# Patient Record
Sex: Male | Born: 1960
Health system: Southern US, Community
[De-identification: ages and names within clinical notes are randomized; demographics above are authoritative.]

## PROBLEM LIST (undated history)

## (undated) DIAGNOSIS — G8929 Other chronic pain: Secondary | ICD-10-CM

## (undated) DIAGNOSIS — R1110 Vomiting, unspecified: Secondary | ICD-10-CM

## (undated) DIAGNOSIS — I200 Unstable angina: Secondary | ICD-10-CM

## (undated) DIAGNOSIS — E669 Obesity, unspecified: Secondary | ICD-10-CM

## (undated) DIAGNOSIS — M545 Low back pain, unspecified: Secondary | ICD-10-CM

## (undated) DIAGNOSIS — I10 Essential (primary) hypertension: Secondary | ICD-10-CM

## (undated) DIAGNOSIS — E785 Hyperlipidemia, unspecified: Secondary | ICD-10-CM

## (undated) DIAGNOSIS — G4730 Sleep apnea, unspecified: Secondary | ICD-10-CM

## (undated) DIAGNOSIS — I119 Hypertensive heart disease without heart failure: Secondary | ICD-10-CM

## (undated) DIAGNOSIS — E119 Type 2 diabetes mellitus without complications: Secondary | ICD-10-CM

## (undated) DIAGNOSIS — E11319 Type 2 diabetes mellitus with unspecified diabetic retinopathy without macular edema: Secondary | ICD-10-CM

## (undated) DIAGNOSIS — I2510 Atherosclerotic heart disease of native coronary artery without angina pectoris: Secondary | ICD-10-CM

## (undated) DIAGNOSIS — M51369 Other intervertebral disc degeneration, lumbar region without mention of lumbar back pain or lower extremity pain: Secondary | ICD-10-CM

## (undated) DIAGNOSIS — M1990 Unspecified osteoarthritis, unspecified site: Secondary | ICD-10-CM

## (undated) DIAGNOSIS — J42 Unspecified chronic bronchitis: Secondary | ICD-10-CM

## (undated) DIAGNOSIS — IMO0002 Reserved for concepts with insufficient information to code with codable children: Secondary | ICD-10-CM

## (undated) DIAGNOSIS — M5136 Other intervertebral disc degeneration, lumbar region: Secondary | ICD-10-CM

## (undated) HISTORY — PX: FOOT FRACTURE SURGERY: SHX645

## (undated) HISTORY — PX: PILONIDAL CYST EXCISION: SHX744

## (undated) HISTORY — DX: Type 2 diabetes mellitus with unspecified diabetic retinopathy without macular edema: E11.319

## (undated) HISTORY — DX: Vomiting, unspecified: R11.10

## (undated) HISTORY — DX: Atherosclerotic heart disease of native coronary artery without angina pectoris: I25.10

## (undated) HISTORY — DX: Hyperlipidemia, unspecified: E78.5

## (undated) HISTORY — DX: Hypertensive heart disease without heart failure: I11.9

## (undated) HISTORY — DX: Type 2 diabetes mellitus without complications: E11.9

## (undated) HISTORY — DX: Essential (primary) hypertension: I10

## (undated) HISTORY — PX: SQUAMOUS CELL CARCINOMA EXCISION: SHX2433

## (undated) HISTORY — DX: Obesity, unspecified: E66.9

## (undated) HISTORY — DX: Unspecified osteoarthritis, unspecified site: M19.90

## (undated) HISTORY — PX: CORONARY ANGIOPLASTY: SHX604

---

## 2001-04-20 ENCOUNTER — Ambulatory Visit (HOSPITAL_BASED_OUTPATIENT_CLINIC_OR_DEPARTMENT_OTHER): Admission: RE | Admit: 2001-04-20 | Discharge: 2001-04-20 | Payer: Self-pay | Admitting: Family Medicine

## 2001-07-23 ENCOUNTER — Encounter: Admission: RE | Admit: 2001-07-23 | Discharge: 2001-10-21 | Payer: Self-pay | Admitting: *Deleted

## 2007-04-09 ENCOUNTER — Ambulatory Visit (HOSPITAL_COMMUNITY): Admission: RE | Admit: 2007-04-09 | Discharge: 2007-04-09 | Payer: Self-pay | Admitting: Surgery

## 2007-04-16 ENCOUNTER — Encounter: Admission: RE | Admit: 2007-04-16 | Discharge: 2007-04-16 | Payer: Self-pay | Admitting: Surgery

## 2007-04-16 ENCOUNTER — Ambulatory Visit (HOSPITAL_COMMUNITY): Admission: RE | Admit: 2007-04-16 | Discharge: 2007-04-16 | Payer: Self-pay | Admitting: Surgery

## 2007-07-28 HISTORY — PX: LAPAROSCOPIC GASTRIC BANDING: SHX1100

## 2007-08-06 ENCOUNTER — Encounter: Admission: RE | Admit: 2007-08-06 | Discharge: 2007-11-04 | Payer: Self-pay | Admitting: Surgery

## 2007-08-21 ENCOUNTER — Ambulatory Visit (HOSPITAL_COMMUNITY): Admission: RE | Admit: 2007-08-21 | Discharge: 2007-08-22 | Payer: Self-pay | Admitting: Surgery

## 2007-12-10 ENCOUNTER — Encounter: Admission: RE | Admit: 2007-12-10 | Discharge: 2007-12-10 | Payer: Self-pay | Admitting: Surgery

## 2008-03-03 ENCOUNTER — Encounter: Admission: RE | Admit: 2008-03-03 | Discharge: 2008-03-03 | Payer: Self-pay | Admitting: Surgery

## 2008-04-25 ENCOUNTER — Emergency Department (HOSPITAL_COMMUNITY): Admission: EM | Admit: 2008-04-25 | Discharge: 2008-04-25 | Payer: Self-pay | Admitting: Emergency Medicine

## 2008-06-23 ENCOUNTER — Encounter: Admission: RE | Admit: 2008-06-23 | Discharge: 2008-06-23 | Payer: Self-pay | Admitting: Surgery

## 2008-07-11 ENCOUNTER — Emergency Department (HOSPITAL_COMMUNITY): Admission: EM | Admit: 2008-07-11 | Discharge: 2008-07-11 | Payer: Self-pay | Admitting: Emergency Medicine

## 2009-01-04 ENCOUNTER — Emergency Department (HOSPITAL_COMMUNITY): Admission: EM | Admit: 2009-01-04 | Discharge: 2009-01-05 | Payer: Self-pay | Admitting: Emergency Medicine

## 2009-02-09 ENCOUNTER — Encounter: Admission: RE | Admit: 2009-02-09 | Discharge: 2009-02-09 | Payer: Self-pay | Admitting: Family Medicine

## 2010-10-17 ENCOUNTER — Encounter: Payer: Self-pay | Admitting: Family Medicine

## 2010-12-14 ENCOUNTER — Other Ambulatory Visit: Payer: Self-pay | Admitting: Family Medicine

## 2010-12-14 DIAGNOSIS — M545 Low back pain, unspecified: Secondary | ICD-10-CM

## 2010-12-15 ENCOUNTER — Ambulatory Visit
Admission: RE | Admit: 2010-12-15 | Discharge: 2010-12-15 | Disposition: A | Discharge status: Home / Self Care | Payer: Private Health Insurance - Indemnity | Source: Ambulatory Visit | Attending: Family Medicine | Admitting: Family Medicine

## 2010-12-15 DIAGNOSIS — M545 Low back pain, unspecified: Secondary | ICD-10-CM

## 2011-01-05 LAB — DIFFERENTIAL
Lymphocytes Relative: 16 % (ref 12–46)
Lymphs Abs: 1.5 10*3/uL (ref 0.7–4.0)
Monocytes Absolute: 0.7 10*3/uL (ref 0.1–1.0)
Monocytes Relative: 8 % (ref 3–12)
Neutro Abs: 6.8 10*3/uL (ref 1.7–7.7)

## 2011-01-05 LAB — COMPREHENSIVE METABOLIC PANEL
ALT: 27 U/L (ref 0–53)
AST: 23 U/L (ref 0–37)
Alkaline Phosphatase: 91 U/L (ref 39–117)
CO2: 23 mEq/L (ref 19–32)
Chloride: 103 mEq/L (ref 96–112)
GFR calc Af Amer: >60 mL/min (ref >60)
GFR calc non Af Amer: >60 mL/min (ref >60)
Potassium: 4.1 mEq/L (ref 3.5–5.1)
Total Protein: 6.4 g/dL (ref 6.0–8.3)

## 2011-01-05 LAB — POCT CARDIAC MARKERS
CKMB, poc: <1 ng/mL — ABNORMAL LOW (ref 1.0–8.0)
Myoglobin, poc: 41.9 ng/mL (ref 12–200)
Troponin i, poc: <0.05 ng/mL (ref 0.00–0.09)

## 2011-01-05 LAB — URINALYSIS, ROUTINE W REFLEX MICROSCOPIC
Bilirubin Urine: NEGATIVE
Leukocytes, UA: NEGATIVE
Nitrite: NEGATIVE
Protein, ur: NEGATIVE mg/dL
Urobilinogen, UA: 0.2 mg/dL (ref 0.0–1.0)
pH: 5.5 (ref 5.0–8.0)

## 2011-01-05 LAB — CBC
RBC: 4.54 MIL/uL (ref 4.22–5.81)
WBC: 9.1 10*3/uL (ref 4.0–10.5)

## 2011-02-08 NOTE — Op Note (Signed)
NAME:  [name], [name] NO.:  [id number]   MEDICAL RECORD NO.:  [id number]          PATIENT TYPE:  OIB   LOCATION:  0098                         FACILITY:  [facility]   PHYSICIAN:  Thornton Park. Daphine Deutscher, MD  DATE OF BIRTH:  1960/10/21   DATE OF PROCEDURE:  08/21/2007  DATE OF DISCHARGE:                               OPERATIVE REPORT   PREOPERATIVE DIAGNOSES:  1. Morbid obesity.  2. Diabetes.  3. Sleep apnea.  4. Hypertension.  5. Hyperlipidemia with BMI (body mass index) of 50.   POSTOPERATIVE DIAGNOSES:  1. Morbid obesity.  2. Diabetes.  3. Sleep apnea.  4. Hypertension.  5. Hyperlipidemia with BMI (body mass index) of 50.   PROCEDURE:  Lap band APL system installed.   SURGEON:  Luretha Murphy, MD   ASSISTANT:  Alfonse Ras, MD   ANESTHESIA:  General endotracheal.   DESCRIPTION OF PROCEDURE:  Tim Cook is a 50 year old gentleman  with the above-mentioned morbid obesity and comorbidities.  He is taken  back to room 1, clipped and prepped with Techni-Care and draped  sterilely.  Access to the abdomen was gained through the left upper  quadrant with the OptiVu technique without difficulty.  The abdomen was  insufflated.  The usual other trocars were placed including a 15 in the  right upper quadrant, 11 below that and the 5-mm in the upper midline  for the [facility] retractor.  The liver was retracted out of the way.  I  then went up and he had a fairly large fat pad at the GE junction.  I  took this down.  I inserted the APL tube and balloon and blew it up to  15 cm and brought it back and it met resistance at the GE junction and  there did not appear to be a hiatal hernia.  I also pulled up his upper  GI to look at that as well.  I then went down on the right side in the  crus at the stripe of fat across the right crus and made a small  incision there with the Bovie and then passed the band passer from the  below port on the right and it came  through easily.  The APL system was  introduced, threaded around and then snapped in place with the sizing  tube in place.  The sizing tube was then withdrawn.  The band was then  plicated with three sutures, getting good bites of stomach and the  pseudo pouch above.  These were secured  with a tie knot device.  The tubing was then brought out and connected  to a port and sutured to the fascia with 2-0 Prolenes.  The wound was  irrigated and all port sites were closed with 4-0 Vicryl with Benzoin  and Steri-Strips.  The patient tolerated the procedure well and was  taken to the recovery room in satisfactory condition.      Thornton Park Daphine Deutscher, MD  Electronically Signed     MBM/MEDQ  D:  08/21/2007  T:  08/21/2007  Job:  [id number]  cc:   Elana Alm. Nicholos Johns, M.D.  Fax: [phone]   Royetta Crochet, MD  Fax: [phone]

## 2011-02-11 NOTE — H&P (Signed)
NAME:  Tim Cook, Tim Cook NO.:  [id number]   MEDICAL RECORD NO.:  [id number]          PATIENT TYPE:  EMS   LOCATION:  ED                           FACILITY[facility]  [facility]   PHYSICIAN:  Adolph Pollack, M.D.DATE OF BIRTH:  [birth year]   DATE OF ADMISSION:  01/04/2009  DATE OF DISCHARGE:  01/05/2009                              HISTORY & PHYSICAL   CHIEF COMPLAINT:  Severe epigastric pain, dry heaves.   HISTORY:  This 50 year old male underwent a laparoscopic band  installation on November 2008.  This is complicated by the band being  too tight and in October 2009 he had the fluid removed, recovered from  that and in late February 2010 had a fill, and then began having some  problems with reflux, intermittent dysphagia, and regurgitation of food.  He then developed bronchitis and has been taking steroids, Z-Pak,  inhaler, and cough medicine.  He had the sudden onset, after eating  chili, of severe epigastric and substernal chest pain with dry heaves  and presented to the emergency department.  He underwent a cardiac  workup which was negative.  Chest x-ray suggested a dilated stomach and  abdominal series demonstrates massive gastric distention.  Subsequently,  a nasogastric tube was placed and a repeat x-ray demonstrated  significant decompression.  NG tube has since been removed.   PAST MEDICAL HISTORY:  1. Hypertension.  2. Diabetes mellitus.  3. Morbid obesity.  4. Hyperlipidemia.  5. Obstructive sleep apnea.   PREVIOUS OPERATIONS:  1. Laparoscopic band insulation.  2. Pilonidal cystectomy.   MEDICATIONS:  He has an inhaler, type unknown, and cough medicine.   SOCIAL HISTORY:  No tobacco use.  Occasional alcohol use.  He is  married, here with his wife.   REVIEW OF SYSTEMS:  Per HPI and specifically that he has lost 50 pounds  and he had to have the band fluid removed in October of 2009.   PHYSICAL EXAM:  GENERAL:  An overweight male in no acute distress.  VITAL SIGNS:  Temperature is 97.5, pulse 74, blood pressure 130/75.  RESPIRATORY:  Breath sounds equal and clear.  Respirations unlabored.  CARDIOVASCULAR:  Regular rate.  Regular rhythm.  No murmur.  No JVD.  ABDOMEN:  Soft, nontender.  Multiple scars and foreign bodies palpable  deep in the right abdomen in the subcutaneous tissues.  Active bowel  sounds noted.   LABORATORY DATA:  CBC within normal limits.  Electrolytes notable for  glucose of 273.  Cardiac enzymes normal.  Lipase 20.   Initial abdominal x-ray demonstrates massive gastric distention and a  vertically oriented laparoscopic band.  There is normal caliber small-  bowel and colon.  Post NG abdominal x-ray demonstrates gas decompression  with mild distention.   IMPRESSION:  Symptomatic gastric distention, thought could be gastric  outlet obstruction, but more likely the band is too tight and he is  unable to belch much.   PLAN:  Will put him on a liquid diet trial, repeat the x-rays in 3  hours, and I will discuss this with Dr. Daphine Deutscher.  He will likely  need to  have his band fluid withdrawn.      Adolph Pollack, M.D.  Electronically Signed     TJR/MEDQ  D:  01/05/2009  T:  01/05/2009  Job:  [id number]

## 2011-07-05 LAB — BASIC METABOLIC PANEL
Calcium: 9.3
Chloride: 100
GFR calc Af Amer: >60
GFR calc non Af Amer: >60
Potassium: 3.6

## 2011-07-05 LAB — CBC
Hemoglobin: 12.9 — ABNORMAL LOW
Platelets: 263
RDW: 12.3

## 2011-07-05 LAB — URINALYSIS, ROUTINE W REFLEX MICROSCOPIC
Protein, ur: NEGATIVE
Urobilinogen, UA: 0.2
pH: 6

## 2011-07-05 LAB — DIFFERENTIAL
Eosinophils Relative: 1
Lymphocytes Relative: 21
Lymphs Abs: 1.5
Monocytes Absolute: 0.6
Neutro Abs: 5.2

## 2011-07-05 LAB — HEMOGLOBIN AND HEMATOCRIT, BLOOD: Hemoglobin: 15.2

## 2011-11-22 ENCOUNTER — Other Ambulatory Visit: Payer: Self-pay | Admitting: Gastroenterology

## 2011-11-22 DIAGNOSIS — R112 Nausea with vomiting, unspecified: Secondary | ICD-10-CM

## 2011-11-25 ENCOUNTER — Other Ambulatory Visit: Payer: Private Health Insurance - Indemnity

## 2011-11-29 ENCOUNTER — Ambulatory Visit
Admission: RE | Admit: 2011-11-29 | Discharge: 2011-11-29 | Disposition: A | Discharge status: Home / Self Care | Payer: Self-pay | Source: Ambulatory Visit | Attending: Gastroenterology | Admitting: Gastroenterology

## 2011-11-29 ENCOUNTER — Other Ambulatory Visit: Payer: Self-pay | Admitting: Gastroenterology

## 2011-11-29 ENCOUNTER — Telehealth (INDEPENDENT_AMBULATORY_CARE_PROVIDER_SITE_OTHER): Payer: Self-pay

## 2011-11-29 DIAGNOSIS — R112 Nausea with vomiting, unspecified: Secondary | ICD-10-CM

## 2011-11-29 NOTE — Telephone Encounter (Signed)
Pt called to report he was seen by his gastroenterologist, Dr. Dulce Sellar, because of difficulty swallowing.  He had a CT scan which showed his lapband had slipped.  He will need to see Dr. Daphine Deutscher at the first available appointment.

## 2011-12-14 ENCOUNTER — Telehealth (INDEPENDENT_AMBULATORY_CARE_PROVIDER_SITE_OTHER): Payer: Self-pay | Admitting: General Surgery

## 2011-12-14 NOTE — Telephone Encounter (Signed)
Tim Cook with Dr Malena Edman office contacted [place] to request an appt for this patient to have fluid removed. She called on 11/29/11 and the message was not routed to my inbasket. Scheduled the patient with the lap band clinic.

## 2011-12-15 ENCOUNTER — Ambulatory Visit (INDEPENDENT_AMBULATORY_CARE_PROVIDER_SITE_OTHER): Payer: Managed Care, Other (non HMO) | Admitting: Physician Assistant

## 2011-12-15 ENCOUNTER — Encounter (INDEPENDENT_AMBULATORY_CARE_PROVIDER_SITE_OTHER): Payer: Self-pay

## 2011-12-15 VITALS — BP 130/86 | HR 70 | Temp 97.8°F | Resp 18 | Ht 69.0 in | Wt 261.0 lb

## 2011-12-15 DIAGNOSIS — Z4651 Encounter for fitting and adjustment of gastric lap band: Secondary | ICD-10-CM

## 2011-12-15 NOTE — Patient Instructions (Signed)
Return in 2 months or sooner if needed, especially if your symptoms of solid food intolerance persist despite today's measures.

## 2011-12-15 NOTE — Progress Notes (Signed)
  HISTORY: Tim Cook is a 51 y.o.male who received an AP-Large lap-band in November 2008 by Dr. Daphine Deutscher. He comes in with a several-month history of intermittent solid food dysphagia. He says he normally takes slim-fast in the morning and occasionally has issues with this as well. Then, about half the time, he's able to eat pretty much any solid food he desires. He has seen his primary physician who ordered an upper GI study and was referred here for further evaluation.  VITAL SIGNS: Filed Vitals:   12/15/11 0858  BP: 130/86  Pulse: 70  Temp: 97.8 F (36.6 C)  Resp: 18    PHYSICAL EXAM: Physical exam reveals a very well-appearing 51 y.o.male in no apparent distress Neurologic: Awake, alert, oriented Psych: Bright affect, conversant Respiratory: Breathing even and unlabored. No stridor or wheezing Abdomen: Soft, nontender, nondistended to palpation. Incisions well-healed. No incisional hernias. Port easily palpated. Extremities: Atraumatic, good range of motion.  ASSESMENT: 51 y.o.  male  s/p AP-Large lap-band.   PLAN: I have personally reviewed the upper GI study. The band is in excellent position with no evidence of slip. There is no anterior prolapse. There is clear obstruction at the band, with no contrast passing. I accessed the port to evaluate total fill volume, which was measured at 9 mL. The patient's port was accessed again with a 20G Huber needle without difficulty. Clear fluid was aspirated and 0.5 mL saline was removed from the port to give a total predicted volume of 8. mL. The patient was able to swallow water without difficulty following the procedure. I didn't believe he needed a full lap-band holiday as his symptoms were intermittent and he's actually gained weight since seeing Dr. Daphine Deutscher last in May. I advised him to watch his diet closely and to return to see [place] in 2 months. I asked him to return sooner if he has persistent symptoms despite today's measures. He voiced  understanding and agreement.

## 2012-02-09 ENCOUNTER — Encounter (INDEPENDENT_AMBULATORY_CARE_PROVIDER_SITE_OTHER): Payer: Managed Care, Other (non HMO)

## 2016-03-14 DIAGNOSIS — I10 Essential (primary) hypertension: Secondary | ICD-10-CM | POA: Diagnosis not present

## 2016-03-14 DIAGNOSIS — E782 Mixed hyperlipidemia: Secondary | ICD-10-CM | POA: Diagnosis not present

## 2016-03-14 DIAGNOSIS — E119 Type 2 diabetes mellitus without complications: Secondary | ICD-10-CM | POA: Diagnosis not present

## 2016-03-14 DIAGNOSIS — E1165 Type 2 diabetes mellitus with hyperglycemia: Secondary | ICD-10-CM | POA: Diagnosis not present

## 2016-03-23 DIAGNOSIS — E113299 Type 2 diabetes mellitus with mild nonproliferative diabetic retinopathy without macular edema, unspecified eye: Secondary | ICD-10-CM | POA: Diagnosis not present

## 2016-03-28 DIAGNOSIS — D225 Melanocytic nevi of trunk: Secondary | ICD-10-CM | POA: Diagnosis not present

## 2016-03-28 DIAGNOSIS — L570 Actinic keratosis: Secondary | ICD-10-CM | POA: Diagnosis not present

## 2016-03-28 DIAGNOSIS — L738 Other specified follicular disorders: Secondary | ICD-10-CM | POA: Diagnosis not present

## 2016-03-28 DIAGNOSIS — Z85828 Personal history of other malignant neoplasm of skin: Secondary | ICD-10-CM | POA: Diagnosis not present

## 2016-03-28 DIAGNOSIS — L821 Other seborrheic keratosis: Secondary | ICD-10-CM | POA: Diagnosis not present

## 2016-05-04 DIAGNOSIS — E119 Type 2 diabetes mellitus without complications: Secondary | ICD-10-CM | POA: Diagnosis not present

## 2016-05-04 DIAGNOSIS — M659 Synovitis and tenosynovitis, unspecified: Secondary | ICD-10-CM | POA: Diagnosis not present

## 2016-05-04 DIAGNOSIS — M7751 Other enthesopathy of right foot: Secondary | ICD-10-CM | POA: Diagnosis not present

## 2016-05-04 DIAGNOSIS — E785 Hyperlipidemia, unspecified: Secondary | ICD-10-CM | POA: Diagnosis not present

## 2016-05-04 DIAGNOSIS — R079 Chest pain, unspecified: Secondary | ICD-10-CM | POA: Diagnosis not present

## 2016-05-04 DIAGNOSIS — I10 Essential (primary) hypertension: Secondary | ICD-10-CM | POA: Diagnosis not present

## 2016-05-04 DIAGNOSIS — M25774 Osteophyte, right foot: Secondary | ICD-10-CM | POA: Diagnosis not present

## 2016-05-05 ENCOUNTER — Encounter: Payer: Self-pay | Admitting: Cardiology

## 2016-05-05 DIAGNOSIS — R079 Chest pain, unspecified: Secondary | ICD-10-CM | POA: Diagnosis not present

## 2016-05-05 DIAGNOSIS — Z8249 Family history of ischemic heart disease and other diseases of the circulatory system: Secondary | ICD-10-CM | POA: Diagnosis not present

## 2016-05-05 NOTE — H&P (Addendum)
Tim Cook, Tim Cook  Date of visit:  05/04/2016 DOB:  1961/08/09    Age:  55 yrs. Medical record number:  [id number]     Account number:  [id number] Primary Care Provider: Deatra JamesSUN, VYVYAN ____________________________ CURRENT DIAGNOSES  1. Chest pain, unspecified  2. Hyperlipidemia  3. Essential hypertension  4. Family history of ischemic heart disease and other diseases of the circulatory system  5. Obesity  6. Type 2 diabetes mellitus with proliferative diabetic retinopathy without macular edema  7. Bariatric Surgery Status ____________________________ ALLERGIES  No Known Allergies ____________________________ MEDICATIONS  1. aspirin 81 mg chewable tablet, 1 p.o. daily  2. naproxen sodium 220 mg tablet, BID PRN  3. atorvastatin 40 mg tablet, 1 p.o. daily  4. losartan 100 mg tablet, 1 p.o. daily  5. metformin 500 mg tablet, 2 tablets PO BID  6. Cialis 20 mg tablet, PRN ____________________________ HISTORY OF PRESENT ILLNESS This very nice 55 year old male seen for evaluation of chest and arm tightness. The patient has a prior history of severe morbid obesity and underwent lap band in 2008. He has lost 130 pounds since then. He has a significant family history of cardiac disease with sisters brothers and mother all having had artery disease and bypass grafting prematurely. He has non-insulin-dependent diabetes mellitus, hypertension, and hyperlipidemia. He was in his usual state of health until about 2 weeks ago when he was [place] the [place] and when walking up a hill had fairly severe midsternal pressure and severe right arm pain with exertion. The discomfort was also accompanied with dyspnea and would be relieved when he rested and recur when he started walking again. He came home and had a similar episode while mowing the grass last week but does not have recurrent symptoms otherwise. His EKG was normal. He normally denies PND, orthopnea or edema. He does have erectile dysfunction as  well as diabetic retinopathy in the past. He has a prior history of sleep apnea but or has not worn CPAP recently. ____________________________ PAST HISTORY  Past Medical Illnesses:  hypertension, DM-non-insulin dependent, hyperlipidemia, degenerative joint disease, obesity, sleep apnea;  Cardiovascular Illnesses:  no previous history of cardiac disease;  Infectious Diseases:  no previous history of significant infectious diseases;  Surgical Procedures:  gastric banding, right foot osteotomy, removal of pilonidal cyst;  Trauma History:  no previous history of significant trauma;  NYHA Classification:  I;  Canadian Angina Classification:  Class 2: Angina with moderate exertion;  Cardiology Procedures-Invasive:  no previous interventional or invasive cardiology procedures;  Cardiology Procedures-Noninvasive:  no previous non-invasive cardiovascular testing;  Peripheral Vascular Procedures:  no previous invasive peripheral vascular procedures.;  LVEF not documented,   ____________________________ CARDIO-PULMONARY TEST DATES EKG Date:  05/04/2016;   ____________________________ FAMILY HISTORY Brother -- Brother dead, Cancer, Myocardial infarction, Hypertension, Coronary artery bypass grafting Father -- Father dead, Myocardial infarction at less than 2860, Hypertension Mother -- Mother dead, Myocardial infarction at less than 1160, Hypertension Sister -- Sister alive with problem, Diabetes mellitus, High cholesterol, Coronary artery bypass grafting, Premature coronary heart disease ____________________________ SOCIAL HISTORY Alcohol Use:  rare and 1 to 2 drinks per month;  Smoking:  used to smoke but quit 1992;  Diet:  regular diet;  Lifestyle:  married;  Exercise:  no regular exercise;  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 Respiratory: mild dyspnea with exertion Cardiovascular:  please review HPI Abdominal: denies dyspepsia, GI bleeding, constipation, or diarrhea Genitourinary-Male: erectile dysfunction  Musculoskeletal:  arthritis of the knees, lumbar disc disease, chronic low back pain Neurological:  headaches Psychiatric:  denies depression or anxiety Hematological/Immunologic:  denies any food allergies, bleeding disorders. ____________________________ PHYSICAL EXAMINATION VITAL SIGNS  Blood Pressure:  118/72 Sitting, Left arm, regular cuff  , 122/80 Standing, Left arm and regular cuff   Pulse:  84/min. Weight:  253.00 lbs. Height:  69"BMI: 37  Constitutional:  pleasant white male in no acute distress, moderately obese Skin:  warm and dry to touch, no apparent skin lesions, or masses noted. Head:  normocephalic, normal hair pattern, no masses or tenderness Eyes:  EOMS Intact, PERRLA, C and S clear, Funduscopic exam not done. ENT:  ears, nose and throat reveal no gross abnormalities.  Dentition good. Neck:  supple, without massess. No JVD, thyromegaly or carotid bruits. Carotid upstroke normal. Chest:  normal symmetry, clear to auscultation. Cardiac:  regular rhythm, normal S1 and S2, No S3 or S4, no murmurs, gallops or rubs detected. Abdomen:  abdomen soft,non-tender, no masses, no hepatospenomegaly, or aneurysm noted Peripheral Pulses:  the femoral,dorsalis pedis, and posterior tibial pulses are full and equal bilaterally with no bruits auscultated. Extremities & Back:  no deformities, clubbing, cyanosis, erythema or edema observed. Normal muscle strength and tone. Neurological:  no gross motor or sensory deficits noted, affect appropriate, oriented x3. ____________________________ MOST RECENT LIPID PANEL 03/14/16  CHOL TOTL 130 mg/dl, LDL 62 NM, HDL 52 mg/dl, TRIGLYCER 81 mg/dl and CHOL/HDL 2.5  (Calc) ____________________________ IMPRESSIONS/PLAN  1. Chest discomfort suggestive of angina in a patient with multiple risk factors 2. Hypertension controlled 3. Diabetes mellitus with diabetic retinopathy 4. Obesity but with previous bariatric surgery 5. Hyperlipidemia under treatment 6. Multiple cardiovascular risk factors  Recommendations:  Has multiple cardiovascular risk factors and a suggestive history. Will start with a treadmill test tomorrow but he may need to have further imaging based on the symptoms and strong family history. He should take an aspirin a day. 12 lead EKG is normal.    05/05/16  TREADMILL  The patient exercised on the standard Bruce protocol a total of 6:13 minutes into Bruce stage 3 achieving a workload of 8 METS. The test was stopped due to dyspnea and fatigue with mild chest pain. Heart rate rose to 159 which was 96% of predicted maximum. Blood pressure response was normal.  12-lead EKG is normal at rest. With exercise the patient achieved target heart rate and had 1 mm ST segment depression consistent with ischemia. No arrhythmias occurred.  IMPRESSIONS:  1. Clinically and EKG positive for ischemia 2. Reduced exercise tolerance for age   RECOMMENDATIONS:  The patient has multiple cardiovascular risk factors including a strongly positive family history, diabetes hypertension and hyperlipidemia. I recommended that he undergo cardiac catheterization.Cardiac catheterization was discussed with the patient including risks of myocardial infarction, death, stroke, bleeding, arrhythmia, dye allergy, or renal insufficiency. He understands and is willing to proceed. Possibility of percutaneous intervention at the same setting was also discussed with the patient including risks. This will be done as an outpatient initially at [facility] by one of my colleagues with [facility].                         ____________________________ Cardiology Physician:  Georga Hacking, Montez Hageman.  MD [facility]

## 2016-05-09 NOTE — Progress Notes (Signed)
Tawana ScaleZach Smith D.O. [facility] [address] [place], [place] [address] Phone: [phone] Subjective:    I'm seeing this patient by the request  of:  Leanor RubensteinSUN,VYVYAN Y, MD   CC: Left knee pain  BJY:[id number]:Subjective  Tim Cook is a 55 y.o. male coming in with complaint of left knee pain. Patient states Undergoing a months. Starting have instability. Patient is full in a couple times in the knee just giving out. Patient does not remember any true injury. Sometimes has intermittent swelling. Denies any radiation down the leg. Patient states that it seems to stay localized. Concerned because it seems to be coming more frequent. Rates the severity of pain is 3 out of 10 but states that the instability is what is concerning.    Past Medical History:  Diagnosis Date  . Arthritis   . Cancer (HCC)    skin on face  . Diabetes mellitus   . Hyperlipidemia   . Hypertension   . Pilonidal cyst   . Vomiting    Past Surgical History:  Procedure Laterality Date  . LAPAROSCOPIC GASTRIC BANDING  07/2007   by Dr. Daphine DeutscherMartin   Social History   Social History  . Marital status: Married    Spouse name: N/A  . Number of children: N/A  . Years of education: N/A   Social History Main Topics  . Smoking status: Former Smoker    Quit date: 12/15/1991  . Smokeless tobacco: Never Used  . Alcohol use Yes     Comment: beer once per month  . Drug use: No  . Sexual activity: Not Asked   Other Topics Concern  . None   Social History Narrative  . None   No Known Allergies No family history on file. No known autoimmune disease  Past medical history, social, surgical and family history all reviewed in electronic medical record.  No pertanent information unless stated regarding to the chief complaint.   Review of Systems: No headache, visual changes, nausea, vomiting, diarrhea, constipation, dizziness, abdominal pain, skin rash, fevers, chills, night sweats, weight loss, swollen lymph nodes, body  aches, joint swelling, muscle aches, chest pain, shortness of breath, mood changes.   Objective  Blood pressure 130/82, pulse 85, height 5\' 9"  (1.753 m), weight 248 lb (112.5 kg), SpO2 97 %.  General: No apparent distress alert and oriented x3 mood and affect normal, dressed appropriately.  HEENT: Pupils equal, extraocular movements intact  Respiratory: Patient's speak in full sentences and does not appear short of breath  Cardiovascular: No lower extremity edema, non tender, no erythema  Skin: Warm dry intact with no signs of infection or rash on extremities or on axial skeleton.  Abdomen: Soft nontender  Neuro: Cranial nerves II through XII are intact, neurovascularly intact in all extremities with 2+ DTRs and 2+ pulses.  Lymph: No lymphadenopathy of posterior or anterior cervical chain or axillae bilaterally.  Gait normal with good balance and coordination.  MSK:  Non tender with full range of motion and good stability and symmetric strength and tone of shoulders, elbows, wrist, hip, and ankles bilaterally.  Knee: Left Normal to inspection with no erythema or effusion or obvious bony abnormalities. Palpation normal with no warmth, joint line tenderness, patellar tenderness, or condyle tenderness. ROM full in flexion and extension and lower leg rotation. Ligaments with solid consistent endpoints including ACL, PCL, LCL, MCL. Positive pain with McMurray's and Thessaly's Patellar glide with mild crepitus. Patellar and quadriceps tendons unremarkable. Hamstring and quadriceps strength  is normal.  Contralateral knee unremarkable  MSK US performed of: Left knee This study was ordered, performed, and interpreted by Terrilee FilesZach Smith D.O.  Knee: All structures visualized. Patient is a degenerative tear of the medial meniscus. No significant dysplasia. Patellar Tendon unremarkable on long and transverse views without effusion. No abnormality of prepatellar bursa. LCL and MCL unremarkable on long  and transverse views. Popliteal tendon does have hypoechoic changes and does have some mild subluxation.  IMPRESSION:  Degenerative meniscal tearing, popliteal tendinitis    Impression and Recommendations:     This case required medical decision making of moderate complexity.      Note: This dictation was prepared with Dragon dictation along with smaller phrase technology. Any transcriptional errors that result from this process are unintentional.

## 2016-05-10 ENCOUNTER — Encounter: Payer: Self-pay | Admitting: Family Medicine

## 2016-05-10 ENCOUNTER — Other Ambulatory Visit: Payer: Self-pay

## 2016-05-10 ENCOUNTER — Ambulatory Visit (INDEPENDENT_AMBULATORY_CARE_PROVIDER_SITE_OTHER)
Admission: RE | Admit: 2016-05-10 | Discharge: 2016-05-10 | Disposition: A | Discharge status: Home / Self Care | Payer: BLUE CROSS/BLUE SHIELD | Source: Ambulatory Visit | Attending: Family Medicine | Admitting: Family Medicine

## 2016-05-10 ENCOUNTER — Ambulatory Visit (INDEPENDENT_AMBULATORY_CARE_PROVIDER_SITE_OTHER): Payer: BLUE CROSS/BLUE SHIELD | Admitting: Family Medicine

## 2016-05-10 VITALS — BP 130/82 | HR 85 | Ht 69.0 in | Wt 248.0 lb

## 2016-05-10 DIAGNOSIS — M25562 Pain in left knee: Secondary | ICD-10-CM

## 2016-05-10 DIAGNOSIS — M25362 Other instability, left knee: Secondary | ICD-10-CM

## 2016-05-10 DIAGNOSIS — M25561 Pain in right knee: Secondary | ICD-10-CM | POA: Diagnosis not present

## 2016-05-10 DIAGNOSIS — Z23 Encounter for immunization: Secondary | ICD-10-CM | POA: Diagnosis not present

## 2016-05-10 DIAGNOSIS — M179 Osteoarthritis of knee, unspecified: Secondary | ICD-10-CM | POA: Diagnosis not present

## 2016-05-10 DIAGNOSIS — M7052 Other bursitis of knee, left knee: Secondary | ICD-10-CM | POA: Diagnosis not present

## 2016-05-10 MED ORDER — DICLOFENAC SODIUM 2 % TD SOLN: TRANSDERMAL | 3 refills | Status: DC

## 2016-05-10 MED ORDER — AMBULATORY NON FORMULARY MEDICATION: 0 refills | Status: DC

## 2016-05-10 NOTE — Patient Instructions (Signed)
Good to see you.  Xray downstairs today  Exercises 3 times a week.  Ice 20 minutes 2 times daily. Usually after activity and before bed. pennsaid pinkie amount topically 2 times daily as needed.  Avoid any twisting motions.  Compression sleeve either tommy copper or [email or web address] would be great.  You are a size large See me again in 3-4 weeks and if not better we may need a MRI.

## 2016-05-10 NOTE — Assessment & Plan Note (Signed)
Believe the patient does have a popliteal pain. Patient does have a chronic meniscal tear also noted. Patient is having instability and need a mixing concern. We'll try conservative therapy. X-rays pending. We discussed icing regimen and home exercises. Topical anti-inflammatory's prescribed. We discussed proper shoes. Follow-up again in 3-4 weeks. Patient continue to have internal derangement we may need to consider advanced imaging.

## 2016-05-11 DIAGNOSIS — M25774 Osteophyte, right foot: Secondary | ICD-10-CM | POA: Diagnosis not present

## 2016-05-11 DIAGNOSIS — M7751 Other enthesopathy of right foot: Secondary | ICD-10-CM | POA: Diagnosis not present

## 2016-05-18 ENCOUNTER — Encounter (HOSPITAL_COMMUNITY): Payer: Self-pay | Admitting: Cardiovascular Disease

## 2016-05-18 ENCOUNTER — Ambulatory Visit (HOSPITAL_COMMUNITY)
Admission: RE | Admit: 2016-05-18 | Discharge: 2016-05-19 | Disposition: A | Discharge status: Home / Self Care | Payer: BLUE CROSS/BLUE SHIELD | Source: Ambulatory Visit | Attending: Cardiovascular Disease | Admitting: Cardiovascular Disease

## 2016-05-18 ENCOUNTER — Encounter (HOSPITAL_COMMUNITY)
Admission: RE | Disposition: A | Discharge status: Home / Self Care | Payer: Self-pay | Source: Ambulatory Visit | Attending: Cardiovascular Disease

## 2016-05-18 DIAGNOSIS — I2584 Coronary atherosclerosis due to calcified coronary lesion: Secondary | ICD-10-CM | POA: Insufficient documentation

## 2016-05-18 DIAGNOSIS — E113599 Type 2 diabetes mellitus with proliferative diabetic retinopathy without macular edema, unspecified eye: Secondary | ICD-10-CM | POA: Insufficient documentation

## 2016-05-18 DIAGNOSIS — E782 Mixed hyperlipidemia: Secondary | ICD-10-CM

## 2016-05-18 DIAGNOSIS — E785 Hyperlipidemia, unspecified: Secondary | ICD-10-CM | POA: Diagnosis not present

## 2016-05-18 DIAGNOSIS — I119 Hypertensive heart disease without heart failure: Secondary | ICD-10-CM

## 2016-05-18 DIAGNOSIS — Z7982 Long term (current) use of aspirin: Secondary | ICD-10-CM | POA: Diagnosis not present

## 2016-05-18 DIAGNOSIS — Z9884 Bariatric surgery status: Secondary | ICD-10-CM | POA: Diagnosis not present

## 2016-05-18 DIAGNOSIS — Z87891 Personal history of nicotine dependence: Secondary | ICD-10-CM | POA: Insufficient documentation

## 2016-05-18 DIAGNOSIS — Z8249 Family history of ischemic heart disease and other diseases of the circulatory system: Secondary | ICD-10-CM | POA: Insufficient documentation

## 2016-05-18 DIAGNOSIS — I200 Unstable angina: Secondary | ICD-10-CM | POA: Diagnosis present

## 2016-05-18 DIAGNOSIS — E11319 Type 2 diabetes mellitus with unspecified diabetic retinopathy without macular edema: Secondary | ICD-10-CM

## 2016-05-18 DIAGNOSIS — Z79899 Other long term (current) drug therapy: Secondary | ICD-10-CM | POA: Insufficient documentation

## 2016-05-18 DIAGNOSIS — Z6836 Body mass index (BMI) 36.0-36.9, adult: Secondary | ICD-10-CM | POA: Diagnosis not present

## 2016-05-18 DIAGNOSIS — I25110 Atherosclerotic heart disease of native coronary artery with unstable angina pectoris: Secondary | ICD-10-CM | POA: Diagnosis not present

## 2016-05-18 DIAGNOSIS — Z7984 Long term (current) use of oral hypoglycemic drugs: Secondary | ICD-10-CM | POA: Diagnosis not present

## 2016-05-18 DIAGNOSIS — I2510 Atherosclerotic heart disease of native coronary artery without angina pectoris: Secondary | ICD-10-CM | POA: Diagnosis present

## 2016-05-18 DIAGNOSIS — E669 Obesity, unspecified: Secondary | ICD-10-CM

## 2016-05-18 HISTORY — DX: Low back pain, unspecified: M54.50

## 2016-05-18 HISTORY — DX: Sleep apnea, unspecified: G47.30

## 2016-05-18 HISTORY — DX: Atherosclerotic heart disease of native coronary artery without angina pectoris: I25.10

## 2016-05-18 HISTORY — DX: Other intervertebral disc degeneration, lumbar region: M51.36

## 2016-05-18 HISTORY — PX: CARDIAC CATHETERIZATION: SHX172

## 2016-05-18 HISTORY — DX: Type 2 diabetes mellitus without complications: E11.9

## 2016-05-18 HISTORY — DX: Bariatric surgery status: Z98.84

## 2016-05-18 HISTORY — DX: Reserved for concepts with insufficient information to code with codable children: IMO0002

## 2016-05-18 HISTORY — DX: Unstable angina: I20.0

## 2016-05-18 HISTORY — DX: Low back pain: M54.5

## 2016-05-18 HISTORY — DX: Other chronic pain: G89.29

## 2016-05-18 HISTORY — DX: Mixed hyperlipidemia: E78.2

## 2016-05-18 HISTORY — DX: Unspecified chronic bronchitis: J42

## 2016-05-18 HISTORY — DX: Other intervertebral disc degeneration, lumbar region without mention of lumbar back pain or lower extremity pain: M51.369

## 2016-05-18 LAB — GLUCOSE, CAPILLARY
GLUCOSE-CAPILLARY: 197 mg/dL — AB (ref 65–99)
GLUCOSE-CAPILLARY: 147 mg/dL — AB (ref 65–99)
GLUCOSE-CAPILLARY: 140 mg/dL — AB (ref 65–99)
Glucose-Capillary: 211 mg/dL — ABNORMAL HIGH (ref 65–99)

## 2016-05-18 SURGERY — LEFT HEART CATH AND CORONARY ANGIOGRAPHY
Anesthesia: LOCAL

## 2016-05-18 MED ORDER — MIDAZOLAM HCL 2 MG/2ML IJ SOLN
INTRAMUSCULAR | Status: DC | PRN
  Administered 2016-05-18 (×2): 1 mg via INTRAVENOUS
  Administered 2016-05-18: 2 mg via INTRAVENOUS

## 2016-05-18 MED ORDER — HEPARIN SODIUM (PORCINE) 1000 UNIT/ML IJ SOLN
INTRAMUSCULAR | Status: AC
  Filled 2016-05-18: qty 1

## 2016-05-18 MED ORDER — OXYCODONE-ACETAMINOPHEN 5-325 MG PO TABS
1.0000 | ORAL_TABLET | ORAL | Status: DC | PRN
  Administered 2016-05-18: 1 via ORAL
  Administered 2016-05-18: 21:00:00 2 via ORAL
  Filled 2016-05-18: qty 2
  Filled 2016-05-18: qty 1

## 2016-05-18 MED ORDER — VERAPAMIL HCL 2.5 MG/ML IV SOLN
INTRAVENOUS | Status: AC
  Filled 2016-05-18: qty 2

## 2016-05-18 MED ORDER — TICAGRELOR 90 MG PO TABS
90.0000 mg | ORAL_TABLET | Freq: Two times a day (BID) | ORAL | Status: DC
  Administered 2016-05-18 – 2016-05-19 (×2): 90 mg via ORAL
  Filled 2016-05-18 (×2): qty 1

## 2016-05-18 MED ORDER — SODIUM CHLORIDE 0.9 % WEIGHT BASED INFUSION: 1.0000 mL/kg/h | INTRAVENOUS | Status: DC

## 2016-05-18 MED ORDER — SODIUM CHLORIDE 0.9 % IV SOLN
INTRAVENOUS | Status: AC
  Administered 2016-05-18: 10:00:00 via INTRAVENOUS

## 2016-05-18 MED ORDER — SODIUM CHLORIDE 0.9% FLUSH: 3.0000 mL | Freq: Two times a day (BID) | INTRAVENOUS | Status: DC

## 2016-05-18 MED ORDER — HEPARIN (PORCINE) IN NACL 2-0.9 UNIT/ML-% IJ SOLN
INTRAMUSCULAR | Status: AC
  Filled 2016-05-18: qty 1000

## 2016-05-18 MED ORDER — ATORVASTATIN CALCIUM 40 MG PO TABS
40.0000 mg | ORAL_TABLET | Freq: Every day | ORAL | Status: DC
  Filled 2016-05-18: qty 1

## 2016-05-18 MED ORDER — ASPIRIN EC 81 MG PO TBEC
81.0000 mg | DELAYED_RELEASE_TABLET | Freq: Every day | ORAL | Status: DC
  Administered 2016-05-19: 81 mg via ORAL
  Filled 2016-05-18: qty 1

## 2016-05-18 MED ORDER — MORPHINE SULFATE (PF) 2 MG/ML IV SOLN: 2.0000 mg | INTRAVENOUS | Status: DC | PRN

## 2016-05-18 MED ORDER — ONDANSETRON HCL 4 MG/2ML IJ SOLN: 4.0000 mg | Freq: Four times a day (QID) | INTRAMUSCULAR | Status: DC | PRN

## 2016-05-18 MED ORDER — LIDOCAINE HCL (PF) 1 % IJ SOLN
INTRAMUSCULAR | Status: DC | PRN
  Administered 2016-05-18: 2 mL via SUBCUTANEOUS

## 2016-05-18 MED ORDER — TICAGRELOR 90 MG PO TABS
ORAL_TABLET | ORAL | Status: DC | PRN
  Administered 2016-05-18: 180 mg via ORAL

## 2016-05-18 MED ORDER — HEPARIN (PORCINE) IN NACL 2-0.9 UNIT/ML-% IJ SOLN
INTRAMUSCULAR | Status: DC | PRN
  Administered 2016-05-18: 10 mL via INTRA_ARTERIAL

## 2016-05-18 MED ORDER — SODIUM CHLORIDE 0.9 % WEIGHT BASED INFUSION
3.0000 mL/kg/h | INTRAVENOUS | Status: DC
  Administered 2016-05-18: 3 mL/kg/h via INTRAVENOUS

## 2016-05-18 MED ORDER — SODIUM CHLORIDE 0.9% FLUSH: 3.0000 mL | INTRAVENOUS | Status: DC | PRN

## 2016-05-18 MED ORDER — SODIUM CHLORIDE 0.9 % IV SOLN: 250.0000 mL | INTRAVENOUS | Status: DC | PRN

## 2016-05-18 MED ORDER — ACETAMINOPHEN 325 MG PO TABS: 650.0000 mg | ORAL_TABLET | ORAL | Status: DC | PRN

## 2016-05-18 MED ORDER — FENTANYL CITRATE (PF) 100 MCG/2ML IJ SOLN
INTRAMUSCULAR | Status: AC
  Filled 2016-05-18: qty 2

## 2016-05-18 MED ORDER — LIDOCAINE HCL (PF) 1 % IJ SOLN
INTRAMUSCULAR | Status: AC
  Filled 2016-05-18: qty 30

## 2016-05-18 MED ORDER — MIDAZOLAM HCL 2 MG/2ML IJ SOLN
INTRAMUSCULAR | Status: AC
  Filled 2016-05-18: qty 2

## 2016-05-18 MED ORDER — IOPAMIDOL (ISOVUE-370) INJECTION 76%
INTRAVENOUS | Status: AC
  Filled 2016-05-18: qty 50

## 2016-05-18 MED ORDER — FUROSEMIDE 10 MG/ML IJ SOLN
40.0000 mg | Freq: Two times a day (BID) | INTRAMUSCULAR | Status: DC
  Administered 2016-05-19: 09:00:00 40 mg via INTRAVENOUS
  Filled 2016-05-18 (×2): qty 4

## 2016-05-18 MED ORDER — FENTANYL CITRATE (PF) 100 MCG/2ML IJ SOLN
INTRAMUSCULAR | Status: DC | PRN
  Administered 2016-05-18: 50 ug via INTRAVENOUS
  Administered 2016-05-18 (×2): 25 ug via INTRAVENOUS

## 2016-05-18 MED ORDER — LOSARTAN POTASSIUM 50 MG PO TABS
100.0000 mg | ORAL_TABLET | Freq: Every day | ORAL | Status: DC
  Administered 2016-05-19: 09:00:00 100 mg via ORAL
  Filled 2016-05-18 (×2): qty 2

## 2016-05-18 MED ORDER — ASPIRIN 81 MG PO CHEW: 81.0000 mg | CHEWABLE_TABLET | ORAL | Status: DC

## 2016-05-18 MED ORDER — IOPAMIDOL (ISOVUE-370) INJECTION 76%
INTRAVENOUS | Status: DC | PRN
  Administered 2016-05-18: 245 mL via INTRA_ARTERIAL

## 2016-05-18 MED ORDER — IOPAMIDOL (ISOVUE-370) INJECTION 76%
INTRAVENOUS | Status: AC
  Filled 2016-05-18: qty 100

## 2016-05-18 MED ORDER — TICAGRELOR 90 MG PO TABS
ORAL_TABLET | ORAL | Status: AC
  Filled 2016-05-18: qty 2

## 2016-05-18 MED ORDER — HEPARIN (PORCINE) IN NACL 2-0.9 UNIT/ML-% IJ SOLN
INTRAMUSCULAR | Status: DC | PRN
Start: 2016-05-18 — End: 2016-05-18
  Administered 2016-05-18: 1000 mL

## 2016-05-18 MED ORDER — SODIUM CHLORIDE 0.9% FLUSH
3.0000 mL | Freq: Two times a day (BID) | INTRAVENOUS | Status: DC
Start: 2016-05-18 — End: 2016-05-19
  Administered 2016-05-18 (×2): 3 mL via INTRAVENOUS

## 2016-05-18 MED ORDER — HEPARIN SODIUM (PORCINE) 1000 UNIT/ML IJ SOLN
INTRAMUSCULAR | Status: DC | PRN
  Administered 2016-05-18 (×2): 6000 [IU] via INTRAVENOUS
  Administered 2016-05-18: 3000 [IU] via INTRAVENOUS

## 2016-05-18 MED ORDER — METOPROLOL TARTRATE 25 MG PO TABS
25.0000 mg | ORAL_TABLET | Freq: Every day | ORAL | Status: DC
  Administered 2016-05-19: 09:00:00 25 mg via ORAL
  Filled 2016-05-18 (×2): qty 1

## 2016-05-18 SURGICAL SUPPLY — 26 items
BALLN EMERGE MR 2.0X12 (BALLOONS) ×2
BALLN EMERGE MR 2.5X15 (BALLOONS) ×2
BALLN ~~LOC~~ EMERGE MR 2.75X15 (BALLOONS) ×2
BALLN ~~LOC~~ EMERGE MR 3.25X15 (BALLOONS) ×2
BALLN ~~LOC~~ EUPHORA RX 2.25X8 (BALLOONS) ×2
BALLOON EMERGE MR 2.0X12 (BALLOONS) IMPLANT
BALLOON EMERGE MR 2.5X15 (BALLOONS) IMPLANT
BALLOON ~~LOC~~ EMERGE MR 2.75X15 (BALLOONS) IMPLANT
BALLOON ~~LOC~~ EMERGE MR 3.25X15 (BALLOONS) IMPLANT
BALLOON ~~LOC~~ EUPHORA RX 2.25X8 (BALLOONS) IMPLANT
CATH INFINITI 5 FR JL3.5 (CATHETERS) ×1 IMPLANT
CATH INFINITI 5FR ANG PIGTAIL (CATHETERS) ×1 IMPLANT
CATH INFINITI JR4 5F (CATHETERS) ×1 IMPLANT
CATH VISTA GUIDE 6FR XBLAD3.5 (CATHETERS) ×1 IMPLANT
DEVICE RAD COMP TR BAND LRG (VASCULAR PRODUCTS) ×1 IMPLANT
GLIDESHEATH SLEND SS 6F .021 (SHEATH) ×1 IMPLANT
KIT ENCORE 26 ADVANTAGE (KITS) ×1 IMPLANT
KIT HEART LEFT (KITS) ×2 IMPLANT
PACK CARDIAC CATHETERIZATION (CUSTOM PROCEDURE TRAY) ×2 IMPLANT
STENT PROMUS PREM MR 2.25X16 (Permanent Stent) ×1 IMPLANT
STENT PROMUS PREM MR 2.5X20 (Permanent Stent) ×1 IMPLANT
STENT PROMUS PREM MR 3.0X20 (Permanent Stent) ×1 IMPLANT
TRANSDUCER W/STOPCOCK (MISCELLANEOUS) ×2 IMPLANT
TUBING CIL FLEX 10 FLL-RA (TUBING) ×2 IMPLANT
WIRE COUGAR XT STRL 190CM (WIRE) ×1 IMPLANT
WIRE SAFE-T 1.5MM-J .035X260CM (WIRE) ×1 IMPLANT

## 2016-05-18 NOTE — Interval H&P Note (Signed)
History and Physical Interval Note:  05/18/2016 8:13 AM  Tim Cook  has presented today for cardiac cath with the diagnosis of unstable angina. The various methods of treatment have been discussed with the patient and family. After consideration of risks, benefits and other options for treatment, the patient has consented to  Procedure(s): Left Heart Cath and Coronary Angiography (N/A) as a surgical intervention .  The patient's history has been reviewed, patient examined, no change in status, stable for surgery.  I have reviewed the patient's chart and labs.  Questions were answered to the patient's satisfaction.    Cath Lab Visit (complete for each Cath Lab visit)  Clinical Evaluation Leading to the Procedure:   ACS: No.  Non-ACS:    Anginal Classification: CCS III  Anti-ischemic medical therapy: Minimal Therapy (1 class of medications)  Non-Invasive Test Results: Intermediate-risk stress test findings: cardiac mortality 1-3%/year  Prior CABG: No previous CABG           Tim Cook

## 2016-05-18 NOTE — Progress Notes (Signed)
TR BAND REMOVAL  LOCATION:  right radial  DEFLATED PER PROTOCOL:  Yes.    TIME BAND OFF / DRESSING APPLIED:   1400   SITE UPON ARRIVAL:   Level 0  SITE AFTER BAND REMOVAL:  Level 0  CIRCULATION SENSATION AND MOVEMENT:  Within Normal Limits  Yes.    COMMENTS:    

## 2016-05-18 NOTE — Progress Notes (Signed)
S/W WALT @ [facility] # [phone]   BRILINTA 90 MG BID ( 30 )  COVER- YES  CO-PAY- $ 25.00  60 TAB  PRIOR APPROVAL - NO  PHARMACY : [facility]

## 2016-05-18 NOTE — Care Management Note (Addendum)
Case Management Note  Patient Details  Name: Tim Cook MRN: [id number] Date of Birth: [birth year]  Subjective/Objective:  Patient is s/p coronary stent intervention, per MD note , will be on brilinta.  NCM gave patient 30 day savings card, and told him co pay is 25.00.  NCM will cont to follow for dc needs.   He goes to [facility] on [address] and they do have Brilinta in stock.                 Action/Plan:   Expected Discharge Date:                  Expected Discharge Plan:  Home/Self Care  In-House Referral:     Discharge planning Services  CM Consult  Post Acute Care Choice:    Choice offered to:     DME Arranged:    DME Agency:     HH Arranged:    HH Agency:     Status of Service:  In process, will continue to follow  If discussed at Long Length of Stay Meetings, dates discussed:    Additional Comments:  Leone Havenaylor, Diontay Rosencrans Clinton, RN 05/18/2016, 3:47 PM

## 2016-05-18 NOTE — H&P (View-Only) (Signed)
Tim Cook, Tim Cook  Date of visit:  05/04/2016 DOB:  1961/09/17    Age:  55 yrs. Medical record number:  [id number]     Account number:  [id number] Primary Care Provider: Deatra JamesSUN, VYVYAN ____________________________ CURRENT DIAGNOSES  1. Chest pain, unspecified  2. Hyperlipidemia  3. Essential hypertension  4. Family history of ischemic heart disease and other diseases of the circulatory system  5. Obesity  6. Type 2 diabetes mellitus with proliferative diabetic retinopathy without macular edema  7. Bariatric Surgery Status ____________________________ ALLERGIES  No Known Allergies ____________________________ MEDICATIONS  1. aspirin 81 mg chewable tablet, 1 p.o. daily  2. naproxen sodium 220 mg tablet, BID PRN  3. atorvastatin 40 mg tablet, 1 p.o. daily  4. losartan 100 mg tablet, 1 p.o. daily  5. metformin 500 mg tablet, 2 tablets PO BID  6. Cialis 20 mg tablet, PRN ____________________________ HISTORY OF PRESENT ILLNESS This very nice [age] year old male seen for evaluation of chest and arm tightness. The patient has a prior history of severe morbid obesity and underwent lap band in 2008. He has lost 130 pounds since then. He has a significant family history of cardiac disease with sisters brothers and mother all having had artery disease and bypass grafting prematurely. He has non-insulin-dependent diabetes mellitus, hypertension, and hyperlipidemia. He was in his usual state of health until about 2 weeks ago when he was [place] the [place] and when walking up a hill had fairly severe midsternal pressure and severe right arm pain with exertion. The discomfort was also accompanied with dyspnea and would be relieved when he rested and recur when he started walking again. He came home and had a similar episode while mowing the grass last week but does not have recurrent symptoms otherwise. His EKG was normal. He normally denies PND, orthopnea or edema. He does have erectile dysfunction as  well as diabetic retinopathy in the past. He has a prior history of sleep apnea but or has not worn CPAP recently. ____________________________ PAST HISTORY  Past Medical Illnesses:  hypertension, DM-non-insulin dependent, hyperlipidemia, degenerative joint disease, obesity, sleep apnea;  Cardiovascular Illnesses:  no previous history of cardiac disease;  Infectious Diseases:  no previous history of significant infectious diseases;  Surgical Procedures:  gastric banding, right foot osteotomy, removal of pilonidal cyst;  Trauma History:  no previous history of significant trauma;  NYHA Classification:  I;  Canadian Angina Classification:  Class 2: Angina with moderate exertion;  Cardiology Procedures-Invasive:  no previous interventional or invasive cardiology procedures;  Cardiology Procedures-Noninvasive:  no previous non-invasive cardiovascular testing;  Peripheral Vascular Procedures:  no previous invasive peripheral vascular procedures.;  LVEF not documented,   ____________________________ CARDIO-PULMONARY TEST DATES EKG Date:  05/04/2016;   ____________________________ FAMILY HISTORY Brother -- Brother dead, Cancer, Myocardial infarction, Hypertension, Coronary artery bypass grafting Father -- Father dead, Myocardial infarction at less than 7460, Hypertension Mother -- Mother dead, Myocardial infarction at less than 2560, Hypertension Sister -- Sister alive with problem, Diabetes mellitus, High cholesterol, Coronary artery bypass grafting, Premature coronary heart disease ____________________________ SOCIAL HISTORY Alcohol Use:  rare and 1 to 2 drinks per month;  Smoking:  used to smoke but quit 1992;  Diet:  regular diet;  Lifestyle:  married;  Exercise:  no regular exercise;  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 Respiratory: mild dyspnea with exertion Cardiovascular:  please review HPI Abdominal: denies dyspepsia, GI bleeding, constipation, or diarrhea Genitourinary-Male: erectile dysfunction  Musculoskeletal:  arthritis of the knees, lumbar disc disease, chronic low back pain Neurological:  headaches Psychiatric:  denies depression or anxiety Hematological/Immunologic:  denies any food allergies, bleeding disorders. ____________________________ PHYSICAL EXAMINATION VITAL SIGNS  Blood Pressure:  118/72 Sitting, Left arm, regular cuff  , 122/80 Standing, Left arm and regular cuff   Pulse:  84/min. Weight:  253.00 lbs. Height:  69"BMI: 37  Constitutional:  pleasant white male in no acute distress, moderately obese Skin:  warm and dry to touch, no apparent skin lesions, or masses noted. Head:  normocephalic, normal hair pattern, no masses or tenderness Eyes:  EOMS Intact, PERRLA, C and S clear, Funduscopic exam not done. ENT:  ears, nose and throat reveal no gross abnormalities.  Dentition good. Neck:  supple, without massess. No JVD, thyromegaly or carotid bruits. Carotid upstroke normal. Chest:  normal symmetry, clear to auscultation. Cardiac:  regular rhythm, normal S1 and S2, No S3 or S4, no murmurs, gallops or rubs detected. Abdomen:  abdomen soft,non-tender, no masses, no hepatospenomegaly, or aneurysm noted Peripheral Pulses:  the femoral,dorsalis pedis, and posterior tibial pulses are full and equal bilaterally with no bruits auscultated. Extremities & Back:  no deformities, clubbing, cyanosis, erythema or edema observed. Normal muscle strength and tone. Neurological:  no gross motor or sensory deficits noted, affect appropriate, oriented x3. ____________________________ MOST RECENT LIPID PANEL 03/14/16  CHOL TOTL 130 mg/dl, LDL 62 NM, HDL 52 mg/dl, TRIGLYCER 81 mg/dl and CHOL/HDL 2.5  (Calc) ____________________________ IMPRESSIONS/PLAN  1. Chest discomfort suggestive of angina in a patient with multiple risk factors 2. Hypertension controlled 3. Diabetes mellitus with diabetic retinopathy 4. Obesity but with previous bariatric surgery 5. Hyperlipidemia under treatment 6. Multiple cardiovascular risk factors  Recommendations:  Has multiple cardiovascular risk factors and a suggestive history. Will start with a treadmill test tomorrow but he may need to have further imaging based on the symptoms and strong family history. He should take an aspirin a day. 12 lead EKG is normal.    05/05/16  TREADMILL  The patient exercised on the standard Bruce protocol a total of 6:13 minutes into Bruce stage 3 achieving a workload of 8 METS. The test was stopped due to dyspnea and fatigue with mild chest pain. Heart rate rose to 159 which was 96% of predicted maximum. Blood pressure response was normal.  12-lead EKG is normal at rest. With exercise the patient achieved target heart rate and had 1 mm ST segment depression consistent with ischemia. No arrhythmias occurred.  IMPRESSIONS:  1. Clinically and EKG positive for ischemia 2. Reduced exercise tolerance for age   RECOMMENDATIONS:  The patient has multiple cardiovascular risk factors including a strongly positive family history, diabetes hypertension and hyperlipidemia. I recommended that he undergo cardiac catheterization.Cardiac catheterization was discussed with the patient including risks of myocardial infarction, death, stroke, bleeding, arrhythmia, dye allergy, or renal insufficiency. He understands and is willing to proceed. Possibility of percutaneous intervention at the same setting was also discussed with the patient including risks. This will be [facility] by one of my colleagues [facility].                         ____________________________ Cardiology Physician:  Georga HackingW. Spencer Tilley, Montez HagemanJr.  MD [facility]

## 2016-05-19 ENCOUNTER — Telehealth: Payer: Self-pay

## 2016-05-19 ENCOUNTER — Encounter (HOSPITAL_COMMUNITY): Payer: Self-pay | Admitting: Physician Assistant

## 2016-05-19 DIAGNOSIS — Z7982 Long term (current) use of aspirin: Secondary | ICD-10-CM | POA: Diagnosis not present

## 2016-05-19 DIAGNOSIS — E113599 Type 2 diabetes mellitus with proliferative diabetic retinopathy without macular edema, unspecified eye: Secondary | ICD-10-CM | POA: Diagnosis not present

## 2016-05-19 DIAGNOSIS — Z79899 Other long term (current) drug therapy: Secondary | ICD-10-CM | POA: Diagnosis not present

## 2016-05-19 DIAGNOSIS — Z8249 Family history of ischemic heart disease and other diseases of the circulatory system: Secondary | ICD-10-CM | POA: Diagnosis not present

## 2016-05-19 DIAGNOSIS — E785 Hyperlipidemia, unspecified: Secondary | ICD-10-CM | POA: Diagnosis not present

## 2016-05-19 DIAGNOSIS — I200 Unstable angina: Secondary | ICD-10-CM | POA: Diagnosis not present

## 2016-05-19 DIAGNOSIS — Z6836 Body mass index (BMI) 36.0-36.9, adult: Secondary | ICD-10-CM | POA: Diagnosis not present

## 2016-05-19 DIAGNOSIS — Z7984 Long term (current) use of oral hypoglycemic drugs: Secondary | ICD-10-CM | POA: Diagnosis not present

## 2016-05-19 DIAGNOSIS — I119 Hypertensive heart disease without heart failure: Secondary | ICD-10-CM | POA: Diagnosis not present

## 2016-05-19 DIAGNOSIS — I2584 Coronary atherosclerosis due to calcified coronary lesion: Secondary | ICD-10-CM | POA: Diagnosis not present

## 2016-05-19 DIAGNOSIS — Z9884 Bariatric surgery status: Secondary | ICD-10-CM | POA: Diagnosis not present

## 2016-05-19 DIAGNOSIS — I25110 Atherosclerotic heart disease of native coronary artery with unstable angina pectoris: Secondary | ICD-10-CM | POA: Diagnosis not present

## 2016-05-19 DIAGNOSIS — Z87891 Personal history of nicotine dependence: Secondary | ICD-10-CM | POA: Diagnosis not present

## 2016-05-19 LAB — CBC
HEMATOCRIT: 41.3 % (ref 39.0–52.0)
HEMOGLOBIN: 13.8 g/dL (ref 13.0–17.0)
MCH: 30.5 pg (ref 26.0–34.0)
MCHC: 33.4 g/dL (ref 30.0–36.0)
MCV: 91.2 fL (ref 78.0–100.0)
Platelets: 259 10*3/uL (ref 150–400)
RBC: 4.53 MIL/uL (ref 4.22–5.81)
RDW: 12 % (ref 11.5–15.5)
WBC: 8.6 10*3/uL (ref 4.0–10.5)

## 2016-05-19 LAB — POCT ACTIVATED CLOTTING TIME
ACTIVATED CLOTTING TIME: 296 s
Activated Clotting Time: 252 seconds

## 2016-05-19 LAB — BASIC METABOLIC PANEL
ANION GAP: 4 — AB (ref 5–15)
BUN: 8 mg/dL (ref 6–20)
CO2: 31 mmol/L (ref 22–32)
Calcium: 9 mg/dL (ref 8.9–10.3)
Chloride: 101 mmol/L (ref 101–111)
Creatinine, Ser: 0.84 mg/dL (ref 0.61–1.24)
GLUCOSE: 142 mg/dL — AB (ref 65–99)
POTASSIUM: 4.1 mmol/L (ref 3.5–5.1)
SODIUM: 136 mmol/L (ref 135–145)

## 2016-05-19 LAB — GLUCOSE, CAPILLARY: Glucose-Capillary: 141 mg/dL — ABNORMAL HIGH (ref 65–99)

## 2016-05-19 MED ORDER — NITROGLYCERIN 0.4 MG SL SUBL: 0.4000 mg | SUBLINGUAL_TABLET | SUBLINGUAL | 3 refills | Status: DC | PRN

## 2016-05-19 MED ORDER — TICAGRELOR 90 MG PO TABS: 90.0000 mg | ORAL_TABLET | Freq: Two times a day (BID) | ORAL | 0 refills | Status: DC

## 2016-05-19 MED ORDER — TICAGRELOR 90 MG PO TABS: 90.0000 mg | ORAL_TABLET | Freq: Two times a day (BID) | ORAL | 11 refills | Status: DC

## 2016-05-19 NOTE — Telephone Encounter (Signed)
New message       TCM appt on 06-01-16 per Bjorn Loserhonda

## 2016-05-19 NOTE — Progress Notes (Addendum)
Appreciate Dr. Weldon PickingMcAlhaney's help.  Cath films reviewed yesterday afternoon.  He does have some distal disease in his LAD as well as the distal coronary circulation on the left.  Agree her medical treatment of the severe right coronary artery disease.  I'll see him back in follow-up in one week.  Discussed importance of lifestyle changes.  Would like him to get involved in cardiac rehabilitation.  Darden PalmerW. Spencer Tilley, Jr. MD [facility] 9:00 AM

## 2016-05-19 NOTE — Discharge Instructions (Signed)

## 2016-05-19 NOTE — Progress Notes (Signed)
CARDIAC REHAB PHASE I   PRE:  Rate/Rhythm: 73 SR  BP:  Sitting: 163/84        SaO2: 100 RA  MODE:  Ambulation: 500 ft   POST:  Rate/Rhythm: 83 SR  BP:  Sitting: 158/92         SaO2: 100 RA  Pt ambulated 500 ft on RA, independent, steady gait, tolerated well.  Pt c/o very mild DOE, denies cp, dizziness, declined rest stop. Completed PCI/stent education with pt and wife at bedside.  Reviewed risk factors, anti-platelet therapy, stent card, activity restrictions, ntg, exercise, heart healthy diet, carb counting, portion control, sodium recommendations and phase 2 cardiac rehab. Pt verbalized understanding. Pt states he has viewed the PCI video as well.  Pt agrees to phase 2 cardiac rehab referral, [facility]. Pt to recliner after walk, call bell within reach.   [phone] Joylene GrapesEmily C Kyliah Deanda, RN, BSN 05/19/2016 8:56 AM

## 2016-05-19 NOTE — Discharge Summary (Signed)
Discharge Summary    Patient ID: Tim Cook,  MRN: [id number], DOB/AGE: 04-30-1961 55 y.o.  Admit date: 05/18/2016 Discharge date: 05/19/2016  Primary Care Provider: Leanor RubensteinSUN,VYVYAN Y Primary Cardiologist: Dr Donnie Ahoilley  Discharge Diagnoses    Principal Problem:   Unstable angina [facility](HCC) Active Problems:   Bariatric surgery status   CAD (coronary artery disease), native coronary artery   Hypertensive heart disease without CHF   Controlled type 2 diabetes with retinopathy (HCC)   Hyperlipidemia   Obesity (BMI 30-39.9)   Allergies No Known Allergies  Diagnostic Studies/Procedures    Cardiac cath: 08/23  Prox RCA-2 lesion, 80 %stenosed.  Prox RCA-1 lesion, 80 %stenosed.  Mid RCA lesion, 99 %stenosed.  Ost 1st Mrg to 1st Mrg lesion, 30 %stenosed.  1st Mrg lesion, 50 %stenosed.  3rd Mrg lesion, 30 %stenosed.  Ost LPDA lesion, 70 %stenosed.  Ost 4th Mrg to 4th Mrg lesion, 70 %stenosed.  Dist Cx lesion, 60 %stenosed.  A STENT PROMUS PREM MR 2.25X16 drug eluting stent was successfully placed.  Dist LAD lesion, 90 %stenosed.  Post intervention, there is a 0% residual stenosis.  A STENT PROMUS PREM MR 2.5X20 drug eluting stent was successfully placed.  Mid LAD lesion, 99 %stenosed.  Post intervention, there is a 0% residual stenosis.  A STENT PROMUS PREM MR 3.0X20 drug eluting stent was successfully placed.  Prox LAD lesion, 90 %stenosed.  Post intervention, there is a 10% residual stenosis.  The left ventricular systolic function is normal.  LV end diastolic pressure is moderately elevated.  The left ventricular ejection fraction is 50-55% by visual estimate.  1. Triple vessel CAD 2. Severe tandem stenoses in the proximal, mid and distal LAD. The proximal lesion was calcified.  3. Moderate stenosis in the distal left Circumflex system leading into relatively small left sided posterolateral branches. 4. Severe stenoses in the small non-dominant RCA 5.  Overall preserved LV systolic function with elevated filling pressure. 6. Successful PTCA/DES x 3 in the proximal, mid and distal LAD (non-overlapping stents).   Recommendations: Will treat with ASA/Brilinta for one year. Will continue beta blocker and statin. Will give one dose of IV Lasix later today given elevated filling pressures. Medical management of disease in the distal Circumflex system and small non-dominant RCA.   Lesion #1: (distal LAD): 2.0 x 12 mm balloon x 1. 2.25 x 16 mm Promus Premier DES x 1. Stent post-dilated with a 2.25 x 8 mm [place] balloon x 1.   Lesion #2: ( mid LAD): 2.0 x 12 mm balloon x 1. 2.5 x 20 mm Promus Premier DES x 1. Stent post-dilated with a 2.75 x 15 mm [employer] balloon.   Lesion #3: (proximal LAD): This lesion was calcified but the calcium did not appear to be encasing the entire vessel, appearing more eccentric. A 2.5 x 15 mm balloon was inflated in the lesion. There was a waste in the balloon. A 2.75 x 15 mm [place] balloon was expanded well in the lesion. A 3.0 x 20 mm Promus Premier DES was deployed in the proximal vessel. The stent was post-dilated with a 3.25 x 15 mm  balloon at high pressures with good stent expansion.  _____________   History of Present Illness     55 yo male with a history of HTN, HLD, DM, obesity s/p lap band and FH of premature CAD was seen by Dr Donnie Ahoilley 08/10 for chest pain and scheduled for outpatient cath. He came to the hospital for the  procedure on 08/23.  Hospital Course     Consultants: None  Cardiac cath results are above. Medical management of the CFX & RCA disease was recommended and 3 DES were placed in the LAD. He tolerated the procedure well.  On 08/24, he was seen by Dr Elease Hashimoto and all data were reviewed. His BP was elevated, but he had missed his Cozaar because of the procedure, it was restarted. He had some sinus bradycardia overnight, with heart rates in the 50s, so his beta blocker will be daytime only. Further BP management  will be left to Dr Donnie Aho and Dr Wynelle Link.   He had an elevated LVEDP and was IV Lasix, which was given on 08/24. His respiratory status was at baseline. He was having no chest pain or SOB with ambulation.   He was seen by cardiac rehab and educated on heart-healthy lifestyle modifications, stent restrictions and exercise guidelines. His physical exam was stable except for a small amount of bruising and swelling at the cath site. No further workup was indicated and he is considered stable for discharge, to follow up as an outpatient. _____________  Discharge Vitals Blood pressure (!) 183/89, pulse 77, temperature 97.8 F (36.6 C), temperature source Oral, resp. rate 15, height 5\' 9"  (1.753 m), weight 246 lb 4.1 oz (111.7 kg), SpO2 100 %.  Filed Weights   05/18/16 0650 05/19/16 0405  Weight: 245 lb (111.1 kg) 246 lb 4.1 oz (111.7 kg)  General: Well developed, well nourished, male in no acute distress Head: Eyes PERRLA, No xanthomas.   Normocephalic and atraumatic  Lungs: Clear bilaterally to auscultation. Heart: HRRR S1 S2, without MRG.  Pulses are 2+ & equal. No carotid bruit. No JVD. Abdomen: Bowel sounds are present, abdomen soft and non-tender without masses or  hernias noted. Msk: Normal strength and tone for age. Extremities: No clubbing, cyanosis or edema.  Small amount ecchymosis and edema at cath site Skin:  No rashes or lesions noted. Neuro: Alert and oriented X 3. Psych:  Good affect, responds appropriately   Labs & Radiologic Studies    CBC  Recent Labs  05/19/16 0357  WBC 8.6  HGB 13.8  HCT 41.3  MCV 91.2  PLT 259   Basic Metabolic Panel  Recent Labs  05/19/16 0357  NA 136  K 4.1  CL 101  CO2 31  GLUCOSE 142*  BUN 8  CREATININE 0.84  CALCIUM 9.0   _____________   Disposition   Pt is being discharged home today in good condition.  Follow-up Plans & Appointments    Follow-up Information    W Viann Fish, MD. Schedule an appointment as soon as  possible for a visit in 1 week(s).   Specialty:  Cardiology Contact information: [address] [place] [address] [phone]          Discharge Instructions    Amb Referral to Cardiac Rehabilitation    Complete by:  As directed   Diagnosis:  Coronary Stents      Discharge Medications   Current Discharge Medication List    START taking these medications   Details  nitroGLYCERIN (NITROSTAT) 0.4 MG SL tablet Place 1 tablet (0.4 mg total) under the tongue every 5 (five) minutes as needed for chest pain. Qty: 25 tablet, Refills: 3    ticagrelor (BRILINTA) 90 MG TABS tablet Take 1 tablet (90 mg total) by mouth 2 (two) times daily. Qty: 60 tablet, Refills: 11      CONTINUE these medications  which have NOT CHANGED   Details  aspirin EC 81 MG tablet Take 81 mg by mouth daily.    atorvastatin (LIPITOR) 40 MG tablet     losartan (COZAAR) 100 MG tablet     metFORMIN (GLUCOPHAGE) 500 MG tablet Take 1,000 mg by mouth 2 (two) times daily with a meal.     metoprolol tartrate (LOPRESSOR) 25 MG tablet Take 25 mg by mouth daily.     Diclofenac Sodium (PENNSAID) 2 % SOLN Apply 1 pump twice daily. Qty: 112 g, Refills: 3          Outstanding Labs/Studies   None  Duration of Discharge Encounter   Greater than 30 minutes including physician time.  Melida Quitter NP 05/19/2016, 9:17 AM   Attending Note:   The patient was seen and examined.  Agree with assessment and plan as noted above.  Changes made to the above note as needed.  Patient seen and independently examined with Theodore Demark, PA .   We discussed all aspects of the encounter. I agree with the assessment and plan as stated above.  Pt has done well following PCI. Had elevated LVEDP at cath so he received 2 doses of IV lasix. Is not going home on lasix. Advised him to avoid salt. Exercise  Follow up with Dr. Donnie Aho     I have spent a total of 40 minutes with patient reviewing  hospital  notes , telemetry, EKGs, labs and examining patient as well as establishing an assessment and plan that was discussed with the patient. > 50% of time was spent in direct patient care.    Vesta Mixer, Montez Hageman., MD, [facility] 05/19/2016, 10:11 AM [address],  [address] Office [phone] Pager [phone]

## 2016-05-20 NOTE — Telephone Encounter (Signed)
This is a pt of Dr. Justice BritainSpence Tilley and will followed by him.

## 2016-05-27 DIAGNOSIS — E785 Hyperlipidemia, unspecified: Secondary | ICD-10-CM | POA: Diagnosis not present

## 2016-05-27 DIAGNOSIS — I2510 Atherosclerotic heart disease of native coronary artery without angina pectoris: Secondary | ICD-10-CM | POA: Diagnosis not present

## 2016-06-01 ENCOUNTER — Ambulatory Visit: Payer: BLUE CROSS/BLUE SHIELD | Admitting: Physician Assistant

## 2016-06-08 NOTE — Progress Notes (Signed)
[name] [address] [place], [place] [address] Phone: [phone] Subjective:    I'm seeing this patient by the request  of:  Leanor Rubenstein, MD    CC: Left knee pain f/u  [id number]  Tim Cook is a 55 y.o. male coming in with complaint of left knee pain. Month ago and did have more of a popliteal tendinitis as well as a small meniscal tear. Have instability. Patient has had it where his knee has buckled a couple times since last visit. Patient has almost fallen twice.     Past Medical History:  Diagnosis Date  . Arthritis    "knees, lower back, hands" (05/18/2016)  . CAD (coronary artery disease), native coronary artery    05/18/16  Cath severe 90% LAD prox, 99% mid, Circ dominant with 30% OM1, 50% OM2, 30% OM3, 60% distal circ and PL, severe stenosis nondominant RCA.  EF 55%  3.0x 20, 2.5 x 20 mm, 2.25 x 16 mm Promus premier sent to LAD Dr. Clifton James   . Chronic bronchitis (HCC)    "most years" (05/18/2016)  . Chronic lower back pain   . DDD (degenerative disc disease), lumbar   . Hyperlipidemia   . Hypertension   . Sleep apnea    "dx'd; never wore mask" (05/18/2016)  . Squamous carcinoma (HCC)    cut off right upper arm, left shoulder; froze off nose and right side of face  . Type II diabetes mellitus (HCC)   . Unstable angina (HCC)   . Vomiting    Past Surgical History:  Procedure Laterality Date  . CARDIAC CATHETERIZATION N/A 05/18/2016   Procedure: Left Heart Cath and Coronary Angiography;  Surgeon: Kathleene Hazel, MD;  Location[facility] INVASIVE CV LAB;  Service: Cardiovascular;  Laterality: N/A;  . CARDIAC CATHETERIZATION N/A 05/18/2016   Procedure: Coronary Stent Intervention;  Surgeon: Kathleene Hazel, MD;  Location: [facility] INVASIVE CV LAB;  Service: Cardiovascular;  Laterality: N/A;  . CORONARY ANGIOPLASTY    . FOOT FRACTURE SURGERY Right 1970s  . LAPAROSCOPIC GASTRIC BANDING  07/2007   by Dr. Daphine Deutscher  . PILONIDAL CYST  EXCISION  ~ 1985   "took 8 out all at once"  . SQUAMOUS CELL CARCINOMA EXCISION     right upper arm, left shoulder   Social History   Social History  . Marital status: Married    Spouse name: N/A  . Number of children: N/A  . Years of education: N/A   Social History Main Topics  . Smoking status: Former Smoker    Packs/day: 0.50    Years: 12.00    Types: Cigarettes    Quit date: 12/15/1991  . Smokeless tobacco: Former Neurosurgeon    Types: Snuff     Comment: "quit snuff in ~ 1981"  . Alcohol use 4.2 oz/week    7 Cans of beer per week  . Drug use: No  . Sexual activity: Yes   Other Topics Concern  . None   Social History Narrative  . None   No Known Allergies No family history on file. No known autoimmune disease  Past medical history, social, surgical and family history all reviewed in electronic medical record.  No pertanent information unless stated regarding to the chief complaint.   Review of Systems: No headache, visual changes, nausea, vomiting, diarrhea, constipation, dizziness, abdominal pain, skin rash, fevers, chills, night sweats, weight loss, swollen lymph nodes, body aches, joint swelling, muscle aches, chest pain, shortness of  breath, mood changes.   Objective  Blood pressure 122/80, pulse 72, weight 248 lb (112.5 kg), SpO2 98 %.  General: No apparent distress alert and oriented x3 mood and affect normal, dressed appropriately.  HEENT: Pupils equal, extraocular movements intact  Respiratory: Patient's speak in full sentences and does not appear short of breath  Cardiovascular: No lower extremity edema, non tender, no erythema  Skin: Warm dry intact with no signs of infection or rash on extremities or on axial skeleton.  Abdomen: Soft nontender  Neuro: Cranial nerves II through XII are intact, neurovascularly intact in all extremities with 2+ DTRs and 2+ pulses.  Lymph: No lymphadenopathy of posterior or anterior cervical chain or axillae bilaterally.  Gait  normal with good balance and coordination.  MSK:  Non tender with full range of motion and good stability and symmetric strength and tone of shoulders, elbows, wrist, hip, and ankles bilaterally.  Knee: Left Normal to inspection with no erythema or effusion or obvious bony abnormalities. Palpation normal with no warmth, joint line tenderness, patellar tenderness, or condyle tenderness. ROM full in flexion and extension and lower leg rotation. Ligaments with solid consistent endpoints including ACL, PCL, LCL, MCL. Positive pain with McMurray's and Thessaly's Patellar glide with mild crepitus. Patellar and quadriceps tendons unremarkable. Hamstring and quadriceps strength is normal.  Contralateral knee unremarkable No significant change.      Impression and Recommendations:     This case required medical decision making of moderate complexity.      Note: This dictation was prepared with Dragon dictation along with smaller phrase technology. Any transcriptional errors that result from this process are unintentional.

## 2016-06-09 ENCOUNTER — Ambulatory Visit (INDEPENDENT_AMBULATORY_CARE_PROVIDER_SITE_OTHER): Payer: BLUE CROSS/BLUE SHIELD | Admitting: Family Medicine

## 2016-06-09 ENCOUNTER — Encounter: Payer: Self-pay | Admitting: Family Medicine

## 2016-06-09 VITALS — BP 122/80 | HR 72 | Wt 248.0 lb

## 2016-06-09 DIAGNOSIS — S83282A Other tear of lateral meniscus, current injury, left knee, initial encounter: Secondary | ICD-10-CM

## 2016-06-09 DIAGNOSIS — S83289A Other tear of lateral meniscus, current injury, unspecified knee, initial encounter: Secondary | ICD-10-CM | POA: Insufficient documentation

## 2016-06-09 DIAGNOSIS — S83282D Other tear of lateral meniscus, current injury, left knee, subsequent encounter: Secondary | ICD-10-CM

## 2016-06-09 HISTORY — DX: Other tear of lateral meniscus, current injury, unspecified knee, initial encounter: S83.289A

## 2016-06-09 NOTE — Assessment & Plan Note (Signed)
Patient does have a meniscal tear on the lateral aspect as well as the popliteal tendinitis. Has not responded well to conservative therapy at this time and continues to have instability. Patient declined advance imaging at this point but will be sent to formal physical therapy. Brace given today for stability. Patient knows if worsening symptoms that advance imaging would be warranted because possible surgical intervention will be needed. No locking at the moment but we'll continue to monitor. Patient will follow-up again in 3-4 weeks for further evaluation and treatment.

## 2016-06-09 NOTE — Patient Instructions (Signed)
Good to see you  We will do the brace and wear it when doing a lot of activity  Ice is still your friend Stay active.  Pennsaid if you need it. Physical therapy will be calling you.  See me again in 4 weeks and if not better we will consider injection and possible MRI.

## 2016-06-14 DIAGNOSIS — I2510 Atherosclerotic heart disease of native coronary artery without angina pectoris: Secondary | ICD-10-CM | POA: Diagnosis not present

## 2016-07-04 DIAGNOSIS — S83282D Other tear of lateral meniscus, current injury, left knee, subsequent encounter: Secondary | ICD-10-CM | POA: Diagnosis not present

## 2016-07-04 DIAGNOSIS — S83282A Other tear of lateral meniscus, current injury, left knee, initial encounter: Secondary | ICD-10-CM | POA: Diagnosis not present

## 2016-07-06 NOTE — Progress Notes (Signed)
Tim ScaleZach Leshay Cook D.O. [facility] [address] [place], [place] [address] Phone: [phone] Subjective:    I'm seeing this patient by the request  of:  Tim RubensteinSUN,VYVYAN Y, MD    CC: Left knee pain f/u  ION:[id number]:Subjective  Tim Cook is a 55 Cook.o. male coming in with complaint of left knee pain. Month ago and did have more of a popliteal tendinitis as well as a small meniscal tear. Patient was having instability. Was having sometimes metabolic and was given out on him. Patient was given a brace as well as sent to physical therapy. Patient was to continue conservative therapy. Patient states Pain seems to be improved but unfortunately continues to have buckling. Can give out on him at any time. Had difficulty with the brace was not wearing it regularly. Did start with formal physical therapy and has gone a couple times with some mild improvement. Patient though is concern that is not making as much progress and it is affecting his daily activities as well as work.  Patient is also having lower back pain. Past medical history significant for degenerative disc disease. States that he was seen a specialist and had many different injections but no significant improvement. States that he has more of a throbbing aching pain. Worse after sitting for long amount of time and has significant tightness. Patient wants to be more active but feel like this is what stops him. No significant radiation down the legs or any numbness or tingling. No weakness. States that it seems to be localized    Past Medical History:  Diagnosis Date  . Arthritis    "knees, lower back, hands" (05/18/2016)  . CAD (coronary artery disease), native coronary artery    05/18/16  Cath severe 90% LAD prox, 99% mid, Circ dominant with 30% OM1, 50% OM2, 30% OM3, 60% distal circ and PL, severe stenosis nondominant RCA.  EF 55%  3.0x 20, 2.5 x 20 mm, 2.25 x 16 mm Promus premier sent to LAD Tim Cook   . Chronic bronchitis (HCC)    "most years" (05/18/2016)  . Chronic lower back pain   . DDD (degenerative disc disease), lumbar   . Hyperlipidemia   . Hypertension   . Sleep apnea    "dx'd; never wore mask" (05/18/2016)  . Squamous carcinoma    cut off right upper arm, left shoulder; froze off nose and right side of face  . Type II diabetes mellitus (HCC)   . Unstable angina (HCC)   . Vomiting    Past Surgical History:  Procedure Laterality Date  . CARDIAC CATHETERIZATION N/A 05/18/2016   Procedure: Left Heart Cath and Coronary Angiography;  Surgeon: Tim Hazelhristopher D McAlhany, MD;  [facility];  Service: Cardiovascular;  Laterality: N/A;  . CARDIAC CATHETERIZATION N/A 05/18/2016   Procedure: Coronary Stent Intervention;  Surgeon: Tim Hazelhristopher D McAlhany, MD;  Location: [facility] INVASIVE CV LAB;  Service: Cardiovascular;  Laterality: N/A;  . CORONARY ANGIOPLASTY    . FOOT FRACTURE SURGERY Right 1970s  . LAPAROSCOPIC GASTRIC BANDING  07/2007   by Dr. Daphine DeutscherMartin  . PILONIDAL CYST EXCISION  ~ 1985   "took 8 out all at once"  . SQUAMOUS CELL CARCINOMA EXCISION     right upper arm, left shoulder   Social History   Social History  . Marital status: Married    Spouse name: N/A  . Number of children: N/A  . Years of education: N/A   Social History Main Topics  .  Smoking status: Former Smoker    Packs/day: 0.50    Years: 12.00    Types: Cigarettes    Quit date: 12/15/1991  . Smokeless tobacco: Former Neurosurgeon    Types: Snuff     Comment: "quit snuff in ~ 1981"  . Alcohol use 4.2 oz/week    7 Cans of beer per week  . Drug use: No  . Sexual activity: Yes   Other Topics Concern  . Not on file   Social History Narrative  . No narrative on file   No Known Allergies No family history on file. No known autoimmune disease  Past medical history, social, surgical and family history all reviewed in electronic medical record.  No pertanent information unless stated regarding to the chief complaint.   Review of  Systems: No headache, visual changes, nausea, vomiting, diarrhea, constipation, dizziness, abdominal pain, skin rash, fevers, chills, night sweats, weight loss, swollen lymph nodes, body aches, joint swelling, muscle aches, chest pain, shortness of breath, mood changes.   Objective  There were no vitals taken for this visit.  General: No apparent distress alert and oriented x3 mood and affect normal, dressed appropriately.  HEENT: Pupils equal, extraocular movements intact  Respiratory: Patient's speak in full sentences and does not appear short of breath  Cardiovascular: No lower extremity edema, non tender, no erythema  Skin: Warm dry intact with no signs of infection or rash on extremities or on axial skeleton.  Abdomen: Soft nontender  Neuro: Cranial nerves II through XII are intact, neurovascularly intact in all extremities with 2+ DTRs and 2+ pulses.  Lymph: No lymphadenopathy of posterior or anterior cervical chain or axillae bilaterally.  Gait normal with good balance and coordination.  MSK:  Non tender with full range of motion and good stability and symmetric strength and tone of shoulders, elbows, wrist, hip, and ankles bilaterally.  Knee: Left Normal to inspection with no erythema or effusion or obvious bony abnormalities. Palpation normal with no warmth, joint line tenderness, patellar tenderness, or condyle tenderness. ROM full in flexion and extension and lower leg rotation. Ligaments with solid consistent endpoints including ACL, PCL, LCL, MCL. Positive pain with McMurray's and Thessaly's nursing. Difference in exam today Patellar glide with mild crepitus. Patellar and quadriceps tendons unremarkable. Hamstring and quadriceps strength is normal.  Contralateral knee unremarkable No significant change.  Contralateral knee unremarkable  Back Exam:  Inspection: Unremarkable  Motion: Flexion 35 deg, Extension 25 deg, Side Bending to 35 deg bilaterally,  Rotation to 35 deg  bilaterally  SLR laying: Negative  XSLR laying: Negative  Palpable tenderness: Tender to palpation in the paraspinal musculature of the lumbar spine bilaterally. No midline tenderness. FABER: negative. Tight on the right side Sensory change: Gross sensation intact to all lumbar and sacral dermatomes.  Reflexes: 2+ at both patellar tendons, 2+ at achilles tendons, Babinski's downgoing.  Strength at foot  Plantar-flexion: 5/5 Dorsi-flexion: 5/5 Eversion: 5/5 Inversion: 5/5  Leg strength  Quad: 5/5 Hamstring: 5/5 Hip flexor: 5/5 Hip abductors: 5/5  Gait unremarkable.  Osteopathic findings  T3 extended rotated and side bent right L2 flexed rotated and side bent right Sacrum left on left Impression and Recommendations:     This case required medical decision making of moderate complexity.      Note: This dictation was prepared with Dragon dictation along with smaller phrase technology. Any transcriptional errors that result from this process are unintentional.

## 2016-07-07 ENCOUNTER — Ambulatory Visit (INDEPENDENT_AMBULATORY_CARE_PROVIDER_SITE_OTHER): Payer: BLUE CROSS/BLUE SHIELD | Admitting: Family Medicine

## 2016-07-07 ENCOUNTER — Encounter: Payer: Self-pay | Admitting: Family Medicine

## 2016-07-07 ENCOUNTER — Ambulatory Visit (INDEPENDENT_AMBULATORY_CARE_PROVIDER_SITE_OTHER)
Admission: RE | Admit: 2016-07-07 | Discharge: 2016-07-07 | Disposition: A | Discharge status: Home / Self Care | Payer: BLUE CROSS/BLUE SHIELD | Source: Ambulatory Visit | Attending: Family Medicine | Admitting: Family Medicine

## 2016-07-07 VITALS — BP 126/70 | HR 86 | Wt 248.0 lb

## 2016-07-07 DIAGNOSIS — M999 Biomechanical lesion, unspecified: Secondary | ICD-10-CM | POA: Diagnosis not present

## 2016-07-07 DIAGNOSIS — M5416 Radiculopathy, lumbar region: Secondary | ICD-10-CM

## 2016-07-07 DIAGNOSIS — M51369 Other intervertebral disc degeneration, lumbar region without mention of lumbar back pain or lower extremity pain: Secondary | ICD-10-CM

## 2016-07-07 DIAGNOSIS — M5136 Other intervertebral disc degeneration, lumbar region: Secondary | ICD-10-CM | POA: Insufficient documentation

## 2016-07-07 DIAGNOSIS — S83282A Other tear of lateral meniscus, current injury, left knee, initial encounter: Secondary | ICD-10-CM

## 2016-07-07 HISTORY — DX: Other intervertebral disc degeneration, lumbar region: M51.36

## 2016-07-07 HISTORY — DX: Other intervertebral disc degeneration, lumbar region without mention of lumbar back pain or lower extremity pain: M51.369

## 2016-07-07 HISTORY — DX: Biomechanical lesion, unspecified: M99.9

## 2016-07-07 MED ORDER — GABAPENTIN 100 MG PO CAPS: 200.0000 mg | ORAL_CAPSULE | Freq: Every day | ORAL | 3 refills | Status: DC

## 2016-07-07 NOTE — Patient Instructions (Signed)
God to see you  MRI of left knee is ordered and they will call you  Back xray downstairs New exercises for you back 3 times a week.  Gabapentin 200mg  at night.  If groggy then consider 100mg  at night See me again in 4 weeks and we will continue manipulation.

## 2016-07-07 NOTE — Assessment & Plan Note (Signed)
Decision today to treat with OMT was based on Physical Exam  After verbal consent patient was treated with HVLA, ME,  techniques in thoracic, lumbar and sacral areas  Patient tolerated the procedure well with improvement in symptoms  Patient given exercises, stretches and lifestyle modifications  See medications in patient instructions if given  Patient will follow up in 4 weeks  

## 2016-07-07 NOTE — Assessment & Plan Note (Signed)
Due to patient's core strength and he think that this is causing some instability of the back. Patient likely does have degenerative disc disease. I do not have any imaging. X-rays ordered today for further evaluation. Patient will start on gabapentin to help with some of the nighttime pain. Follow-up again with me in 4 weeks. Responded well to manipulation.

## 2016-07-07 NOTE — Assessment & Plan Note (Signed)
Patient continues to have significant instability of the knee states. Not improving significantly with CONSERVATIVE therapy including formal physical therapy at this time. Patient has failed all other treatment options at this time and I do feel that advance imaging with an MRI is necessary. Patient order this today.

## 2016-07-08 DIAGNOSIS — S83282A Other tear of lateral meniscus, current injury, left knee, initial encounter: Secondary | ICD-10-CM | POA: Diagnosis not present

## 2016-07-08 DIAGNOSIS — S83282D Other tear of lateral meniscus, current injury, left knee, subsequent encounter: Secondary | ICD-10-CM | POA: Diagnosis not present

## 2016-07-11 DIAGNOSIS — S83282D Other tear of lateral meniscus, current injury, left knee, subsequent encounter: Secondary | ICD-10-CM | POA: Diagnosis not present

## 2016-07-11 DIAGNOSIS — S83282A Other tear of lateral meniscus, current injury, left knee, initial encounter: Secondary | ICD-10-CM | POA: Diagnosis not present

## 2016-07-14 DIAGNOSIS — S01512A Laceration without foreign body of oral cavity, initial encounter: Secondary | ICD-10-CM | POA: Diagnosis not present

## 2016-07-16 DIAGNOSIS — S83282D Other tear of lateral meniscus, current injury, left knee, subsequent encounter: Secondary | ICD-10-CM | POA: Diagnosis not present

## 2016-07-16 DIAGNOSIS — S83282A Other tear of lateral meniscus, current injury, left knee, initial encounter: Secondary | ICD-10-CM | POA: Diagnosis not present

## 2016-07-18 DIAGNOSIS — S83282A Other tear of lateral meniscus, current injury, left knee, initial encounter: Secondary | ICD-10-CM | POA: Diagnosis not present

## 2016-07-18 DIAGNOSIS — S83282D Other tear of lateral meniscus, current injury, left knee, subsequent encounter: Secondary | ICD-10-CM | POA: Diagnosis not present

## 2016-07-21 ENCOUNTER — Ambulatory Visit
Admission: RE | Admit: 2016-07-21 | Discharge: 2016-07-21 | Disposition: A | Discharge status: Home / Self Care | Payer: BLUE CROSS/BLUE SHIELD | Source: Ambulatory Visit | Attending: Family Medicine | Admitting: Family Medicine

## 2016-07-21 DIAGNOSIS — S83282A Other tear of lateral meniscus, current injury, left knee, initial encounter: Secondary | ICD-10-CM

## 2016-07-21 DIAGNOSIS — M25562 Pain in left knee: Secondary | ICD-10-CM | POA: Diagnosis not present

## 2016-07-23 DIAGNOSIS — S83282A Other tear of lateral meniscus, current injury, left knee, initial encounter: Secondary | ICD-10-CM | POA: Diagnosis not present

## 2016-07-23 DIAGNOSIS — S83282D Other tear of lateral meniscus, current injury, left knee, subsequent encounter: Secondary | ICD-10-CM | POA: Diagnosis not present

## 2016-07-24 ENCOUNTER — Encounter: Payer: Self-pay | Admitting: Physician Assistant

## 2016-07-26 ENCOUNTER — Telehealth: Payer: Self-pay | Admitting: Family Medicine

## 2016-07-26 MED ORDER — VITAMIN D (ERGOCALCIFEROL) 1.25 MG (50000 UNIT) PO CAPS: 50000.0000 [IU] | ORAL_CAPSULE | ORAL | 0 refills | Status: DC

## 2016-07-26 NOTE — Telephone Encounter (Signed)
Sent in new rx based on MRI finding.

## 2016-07-28 DIAGNOSIS — S83282A Other tear of lateral meniscus, current injury, left knee, initial encounter: Secondary | ICD-10-CM | POA: Diagnosis not present

## 2016-07-28 DIAGNOSIS — S83282D Other tear of lateral meniscus, current injury, left knee, subsequent encounter: Secondary | ICD-10-CM | POA: Diagnosis not present

## 2016-08-06 DIAGNOSIS — S83282D Other tear of lateral meniscus, current injury, left knee, subsequent encounter: Secondary | ICD-10-CM | POA: Diagnosis not present

## 2016-08-06 DIAGNOSIS — S83282A Other tear of lateral meniscus, current injury, left knee, initial encounter: Secondary | ICD-10-CM | POA: Diagnosis not present

## 2016-08-06 NOTE — Progress Notes (Signed)
Tim Cook D.O. [facility] [address] [place], [place] [address] Phone: [phone]) [phone] Subjective:    I'm seeing this patient by the request  of:  Leanor RubensteinSUN,VYVYAN Y, MD    CC: Left knee pain f/u  BJY:[id number]:Subjective  Tim Cook is a 55 y.o. male coming in with complaint of left knee pain. Month ago and did have more of a popliteal tendinitis as well as a small meniscal tear. Patient was continuing to have instability. Patient was sent for an MRI of the knee. MRI showed bilateral small degenerative meniscal tears but also moderate arthritic changes of the knee. Patient also did have a 7 mm degenerative focus of the subcortical marrow edema along the medial femoral condyle that could be an early OCD. Started once weekly vitamin D. He is here for follow-up. Patient states Pain seems to being well controlled at the moment. Still has some instability. Does not want have any surgical intervention at this time. Into continue to work. Patient states his long as he wears the brace he seems to do relatively well.   Patient is also having lower back pain. Past medical history significant for degenerative disc disease. States that he was seen a specialist and had many different injections but no significant improvement. States that he has more of a throbbing aching pain. Patient was seen previously and we didn't attempt osteopathic manipulation. Patient was to do more core strengthening as well as postural control. Patient states overall seems to be doing relatively well. Repeat imaging does not show any significant changes in the degenerative disc disease. Patient states and then back pain seemed to be significantly better for quite some time after the manipulation. Recently started hurting again but things secondary to the weather. No radiation the pain at this time.     Past Medical History:  Diagnosis Date  . Arthritis    "knees, lower back, hands" (05/18/2016)  . CAD (coronary artery  disease), native coronary artery    05/18/16  Cath severe 90% LAD prox, 99% mid, Circ dominant with 30% OM1, 50% OM2, 30% OM3, 60% distal circ and PL, severe stenosis nondominant RCA.  EF 55%  3.0x 20, 2.5 x 20 mm, 2.25 x 16 mm Promus premier sent to LAD Dr. Clifton JamesMcALhany   . Chronic bronchitis (HCC)    "most years" (05/18/2016)  . Chronic lower back pain   . DDD (degenerative disc disease), lumbar   . Hyperlipidemia   . Hypertension   . Sleep apnea    "dx'd; never wore mask" (05/18/2016)  . Squamous carcinoma    cut off right upper arm, left shoulder; froze off nose and right side of face  . Type II diabetes mellitus (HCC)   . Unstable angina (HCC)   . Vomiting    Past Surgical History:  Procedure Laterality Date  . CARDIAC CATHETERIZATION N/A 05/18/2016   Procedure: Left Heart Cath and Coronary Angiography;  Surgeon: Kathleene Hazelhristopher D McAlhany, MD;  [facility];  Service: Cardiovascular;  Laterality: N/A;  . CARDIAC CATHETERIZATION N/A 05/18/2016   Procedure: Coronary Stent Intervention;  Surgeon: Kathleene Hazelhristopher D McAlhany, MD;  Location: [facility]VAS[facility] CV LAB;  Service: Cardiovascular;  Laterality: N/A;  . CORONARY ANGIOPLASTY    . FOOT FRACTURE SURGERY Right 1970s  . LAPAROSCOPIC GASTRIC BANDING  07/2007   by Dr. Daphine DeutscherMartin  . PILONIDAL CYST EXCISION  ~ 1985   "took 8 out all at once"  . SQUAMOUS CELL CARCINOMA EXCISION  right upper arm, left shoulder   Social History   Social History  . Marital status: Married    Spouse name: N/A  . Number of children: N/A  . Years of education: N/A   Social History Main Topics  . Smoking status: Former Smoker    Packs/day: 0.50    Years: 12.00    Types: Cigarettes    Quit date: 12/15/1991  . Smokeless tobacco: Former NeurosurgeonUser    Types: Snuff     Comment: "quit snuff in ~ 1981"  . Alcohol use 4.2 oz/week    7 Cans of beer per week  . Drug use: No  . Sexual activity: Yes   Other Topics Concern  . None   Social History Narrative  .  None   No Known Allergies No family history on file. No known autoimmune disease  Past medical history, social, surgical and family history all reviewed in electronic medical record.  No pertanent information unless stated regarding to the chief complaint.   Review of Systems: No headache, visual changes, nausea, vomiting, diarrhea, constipation, dizziness, abdominal pain, skin rash, fevers, chills, night sweats, weight loss, swollen lymph nodes, body aches, joint swelling, muscle aches, chest pain, shortness of breath, mood changes.   Objective  Blood pressure 122/78, pulse 86, height 5\' 9"  (1.753 m), weight 246 lb (111.6 kg), SpO2 97 %.  Systems examined below as of 08/08/16 General: NAD A&O x3 mood, affect normal  HEENT: Pupils equal, extraocular movements intact no nystagmus Respiratory: not short of breath at rest or with speaking Cardiovascular: No lower extremity edema, non tender Skin: Warm dry intact with no signs of infection or rash on extremities or on axial skeleton. Abdomen: Soft nontender, no masses Neuro: Cranial nerves  intact, neurovascularly intact in all extremities with 2+ DTRs and 2+ pulses. Lymph: No lymphadenopathy appreciated today  Gait normal with good balance and coordination.  MSK: Non tender with full range of motion and good stability and symmetric strength and tone of shoulders, elbows, wrist,  hips and ankles bilaterally.    Knee: Left Normal to inspection with no erythema or effusion or obvious bony abnormalities. Palpation normal with no warmth, joint line tenderness, patellar tenderness, or condyle tenderness. ROM full in flexion and extension and lower leg rotation. Ligaments with solid consistent endpoints including ACL, PCL, LCL, MCL. Continue positive meniscal signs Patellar glide with mild crepitus. Patellar and quadriceps tendons unremarkable. Hamstring and quadriceps strength is normal.  Contralateral knee unremarkable No significant change.   Contralateral knee unremarkable  Back Exam:  Inspection: Unremarkable  Motion: Flexion 35 deg, Extension 20 deg, Side Bending to 35 deg bilaterally,  Rotation to 35 deg bilaterally  SLR laying: Negative  XSLR laying: Negative  Palpable tenderness: Mild tenderness to palpation and appears Promus measure but improved FABER: negative. Tight on the right side Sensory change: Gross sensation intact to all lumbar and sacral dermatomes.  Reflexes: 2+ at both patellar tendons, 2+ at achilles tendons, Babinski's downgoing.  Strength at foot  Plantar-flexion: 5/5 Dorsi-flexion: 5/5 Eversion: 5/5 Inversion: 5/5  Leg strength  Quad: 5/5 Hamstring: 5/5 Hip flexor: 5/5 Hip abductors: 5/5  Gait unremarkable.  Osteopathic findings  T3 extended rotated and side bent right T5 extended rotated and side bent right L2 flexed rotated and side bent right Sacrum left on left Impression and Recommendations:     This case required medical decision making of moderate complexity.      Note: This dictation was prepared with Dragon dictation along  with smaller phrase technology. Any transcriptional errors that result from this process are unintentional.

## 2016-08-07 DIAGNOSIS — R05 Cough: Secondary | ICD-10-CM | POA: Diagnosis not present

## 2016-08-08 ENCOUNTER — Ambulatory Visit (INDEPENDENT_AMBULATORY_CARE_PROVIDER_SITE_OTHER): Payer: BLUE CROSS/BLUE SHIELD | Admitting: Family Medicine

## 2016-08-08 ENCOUNTER — Encounter: Payer: Self-pay | Admitting: Family Medicine

## 2016-08-08 VITALS — BP 122/78 | HR 86 | Ht 69.0 in | Wt 246.0 lb

## 2016-08-08 DIAGNOSIS — M5136 Other intervertebral disc degeneration, lumbar region: Secondary | ICD-10-CM | POA: Diagnosis not present

## 2016-08-08 DIAGNOSIS — M999 Biomechanical lesion, unspecified: Secondary | ICD-10-CM | POA: Diagnosis not present

## 2016-08-08 DIAGNOSIS — S83282D Other tear of lateral meniscus, current injury, left knee, subsequent encounter: Secondary | ICD-10-CM

## 2016-08-08 NOTE — Patient Instructions (Addendum)
God to see you  We tried manipulation again for your back, I hope it helps.  6 weeks Happy holidays!

## 2016-08-08 NOTE — Assessment & Plan Note (Signed)
Stable at this time. Continue with conservative therapy. Encourage patient to core strengthening as well as avoiding significant weight gain. Patient will come back and see me again in 6 weeks

## 2016-08-08 NOTE — Assessment & Plan Note (Signed)
Decision today to treat with OMT was based on Physical Exam  After verbal consent patient was treated with   HVLA, ME, techniques in thoracic, lumbar and sacral  areas  Patient tolerated the procedure well with improvement in symptoms  Patient given exercises, stretches and lifestyle modifications  See medications in patient instructions if given  Patient will follow up in 6 weeks  

## 2016-08-08 NOTE — Assessment & Plan Note (Signed)
Patient is a continue with conservative therapy. Possible OCD we discussed. Patient once and continue to monitor. If any increasing instability patient will be referred for surgery.

## 2016-08-09 DIAGNOSIS — S83282A Other tear of lateral meniscus, current injury, left knee, initial encounter: Secondary | ICD-10-CM | POA: Diagnosis not present

## 2016-08-09 DIAGNOSIS — S83282D Other tear of lateral meniscus, current injury, left knee, subsequent encounter: Secondary | ICD-10-CM | POA: Diagnosis not present

## 2016-08-13 DIAGNOSIS — S83282A Other tear of lateral meniscus, current injury, left knee, initial encounter: Secondary | ICD-10-CM | POA: Diagnosis not present

## 2016-08-13 DIAGNOSIS — S83282D Other tear of lateral meniscus, current injury, left knee, subsequent encounter: Secondary | ICD-10-CM | POA: Diagnosis not present

## 2016-08-23 DIAGNOSIS — S83282A Other tear of lateral meniscus, current injury, left knee, initial encounter: Secondary | ICD-10-CM | POA: Diagnosis not present

## 2016-08-23 DIAGNOSIS — S83282D Other tear of lateral meniscus, current injury, left knee, subsequent encounter: Secondary | ICD-10-CM | POA: Diagnosis not present

## 2016-08-27 DIAGNOSIS — S83282A Other tear of lateral meniscus, current injury, left knee, initial encounter: Secondary | ICD-10-CM | POA: Diagnosis not present

## 2016-08-27 DIAGNOSIS — S83282D Other tear of lateral meniscus, current injury, left knee, subsequent encounter: Secondary | ICD-10-CM | POA: Diagnosis not present

## 2016-09-08 DIAGNOSIS — R05 Cough: Secondary | ICD-10-CM | POA: Diagnosis not present

## 2016-09-08 DIAGNOSIS — R809 Proteinuria, unspecified: Secondary | ICD-10-CM | POA: Diagnosis not present

## 2016-09-14 DIAGNOSIS — E119 Type 2 diabetes mellitus without complications: Secondary | ICD-10-CM | POA: Diagnosis not present

## 2016-09-20 DIAGNOSIS — R05 Cough: Secondary | ICD-10-CM | POA: Diagnosis not present

## 2016-09-20 NOTE — Progress Notes (Signed)
Tawana ScaleZach Marvyn Torrez D.O. [facility] [address] [place], [place] [address] Phone: [phone]) [phone] Subjective:    I'm seeing this patient by the request  of:  Leanor RubensteinSUN,VYVYAN Y, MD    CC: Left knee pain f/u Low back pain follow-up  [id number]:Subjective  Tim Cook is a 55 y.o. male coming in with complaint of left knee pain. Month ago and did have more of a popliteal tendinitis as well as a small meniscal tear. Patient was continuing to have instability. Patient was sent for an MRI of the knee. MRI showed bilateral small degenerative meniscal tears but also moderate arthritic changes of the knee. Patient also did have a 7 mm degenerative focus of the subcortical marrow edema along the medial femoral condyle that could be an early OCD. Patient has been doing conservative therapy. Patient states doing well overall. Has not been back to formal physical therapy for quite some time. Patient states though that seems to be doing well with some very minimal instability but not as severe as what it spent in the past.  Patient is also having lower back pain. Past medical history significant for degenerative disc disease. States that he was seen a specialist and had many different injections but no significant improvement.  Patient has had osteopathic manipulation to times. Patient was to start core strengthening in home exercises. Patient states doing good overall. If he continues to do the exercises on a regular basis he seems to do better. Recently the holidays has not been doing as much but still thoracic is doing well. Has not had a significant amount of improvement in quite some time.    Past Medical History:  Diagnosis Date  . Arthritis    "knees, lower back, hands" (05/18/2016)  . CAD (coronary artery disease), native coronary artery    05/18/16  Cath severe 90% LAD prox, 99% mid, Circ dominant with 30% OM1, 50% OM2, 30% OM3, 60% distal circ and PL, severe stenosis nondominant RCA.  EF 55%  3.0x 20,  2.5 x 20 mm, 2.25 x 16 mm Promus premier sent to LAD Dr. Clifton JamesMcALhany   . Chronic bronchitis (HCC)    "most years" (05/18/2016)  . Chronic lower back pain   . DDD (degenerative disc disease), lumbar   . Hyperlipidemia   . Hypertension   . Sleep apnea    "dx'd; never wore mask" (05/18/2016)  . Squamous carcinoma    cut off right upper arm, left shoulder; froze off nose and right side of face  . Type II diabetes mellitus (HCC)   . Unstable angina (HCC)   . Vomiting    Past Surgical History:  Procedure Laterality Date  . CARDIAC CATHETERIZATION N/A 05/18/2016   Procedure: Left Heart Cath and Coronary Angiography;  Surgeon: Kathleene Hazelhristopher D McAlhany, MD;  [facility];  Service: Cardiovascular;  Laterality: N/A;  . CARDIAC CATHETERIZATION N/A 05/18/2016   Procedure: Coronary Stent Intervention;  Surgeon: Kathleene Hazelhristopher D McAlhany, MD;  Location: [facility] INVASIVE CV LAB;  Service: Cardiovascular;  Laterality: N/A;  . CORONARY ANGIOPLASTY    . FOOT FRACTURE SURGERY Right 1970s  . LAPAROSCOPIC GASTRIC BANDING  07/2007   by Dr. Daphine DeutscherMartin  . PILONIDAL CYST EXCISION  ~ 1985   "took 8 out all at once"  . SQUAMOUS CELL CARCINOMA EXCISION     right upper arm, left shoulder   Social History   Social History  . Marital status: Married    Spouse name: N/A  . Number of  children: N/A  . Years of education: N/A   Social History Main Topics  . Smoking status: Former Smoker    Packs/day: 0.50    Years: 12.00    Types: Cigarettes    Quit date: 12/15/1991  . Smokeless tobacco: Former NeurosurgeonUser    Types: Snuff     Comment: "quit snuff in ~ 1981"  . Alcohol use 4.2 oz/week    7 Cans of beer per week  . Drug use: No  . Sexual activity: Yes   Other Topics Concern  . None   Social History Narrative  . None   No Known Allergies No family history on file. No known autoimmune disease  Past medical history, social, surgical and family history all reviewed in electronic medical record.  No pertanent  information unless stated regarding to the chief complaint.   Review of Systems: No headache, visual changes, nausea, vomiting, diarrhea, constipation, dizziness, abdominal pain, skin rash, fevers, chills, night sweats, weight loss, swollen lymph nodes, body aches, joint swelling, muscle aches, chest pain, shortness of breath, mood changes.    Objective  Blood pressure 128/80, pulse 97, height 5\' 9"  (1.753 m), weight 252 lb (114.3 kg), SpO2 97 %.  Systems examined below as of 09/21/16 General: NAD A&O x3 mood, affect normal  HEENT: Pupils equal, extraocular movements intact no nystagmus Respiratory: not short of breath at rest or with speaking Cardiovascular: No lower extremity edema, non tender Skin: Warm dry intact with no signs of infection or rash on extremities or on axial skeleton. Abdomen: Soft nontender, no masses Neuro: Cranial nerves  intact, neurovascularly intact in all extremities with 2+ DTRs and 2+ pulses. Lymph: No lymphadenopathy appreciated today  Gait normal with good balance and coordination.  MSK: Non tender with full range of motion and good stability and symmetric strength and tone of shoulders, elbows, wrist,  hips and ankles bilaterally.     Knee: Left Normal to inspection with no erythema or effusion or obvious bony abnormalities. Palpation normal with no warmth, joint line tenderness, patellar tenderness, or condyle tenderness. ROM full in flexion and extension and lower leg rotation. Ligaments with solid consistent endpoints including ACL, PCL, LCL, MCL. Negative meniscal signs today Patellar glide with mild crepitus. Patellar and quadriceps tendons unremarkable. Hamstring and quadriceps strength is normal.  Contralateral knee unremarkable No significant change.  Contralateral knee unremarkable  Back Exam:  Inspection: Unremarkable  Motion: Flexion 40 deg, Extension 20 deg, Side Bending to 40 deg bilaterally,  Rotation to 35 deg bilaterally  SLR laying:  Negative  XSLR laying: Negative  Palpable tenderness:  FABER: negative. Mild tightness bilaterally Sensory change: Gross sensation intact to all lumbar and sacral dermatomes.  Reflexes: 2+ at both patellar tendons, 2+ at achilles tendons, Babinski's downgoing.  Strength at foot  Plantar-flexion: 5/5 Dorsi-flexion: 5/5 Eversion: 5/5 Inversion: 5/5  Leg strength  Quad: 5/5 Hamstring: 5/5 Hip flexor: 5/5 Hip abductors: 5/5  Gait unremarkable.  Osteopathic findings Cervical C2 flexed rotated and side bent right C4 flexed rotated and side bent left C6 flexed rotated and side bent left T3 extended rotated and side bent right inhaled third rib T9 extended rotated and side bent left L2 flexed rotated and side bent right Sacrum right on right  Impression and Recommendations:     This case required medical decision making of moderate complexity.      Note: This dictation was prepared with Dragon dictation along with smaller phrase technology. Any transcriptional errors that result from this process  are unintentional.

## 2016-09-21 ENCOUNTER — Ambulatory Visit (INDEPENDENT_AMBULATORY_CARE_PROVIDER_SITE_OTHER): Payer: BLUE CROSS/BLUE SHIELD | Admitting: Family Medicine

## 2016-09-21 ENCOUNTER — Encounter: Payer: Self-pay | Admitting: Family Medicine

## 2016-09-21 VITALS — BP 128/80 | HR 97 | Ht 69.0 in | Wt 252.0 lb

## 2016-09-21 DIAGNOSIS — M5136 Other intervertebral disc degeneration, lumbar region: Secondary | ICD-10-CM

## 2016-09-21 DIAGNOSIS — E6609 Other obesity due to excess calories: Secondary | ICD-10-CM | POA: Diagnosis not present

## 2016-09-21 DIAGNOSIS — M999 Biomechanical lesion, unspecified: Secondary | ICD-10-CM | POA: Diagnosis not present

## 2016-09-21 DIAGNOSIS — E782 Mixed hyperlipidemia: Secondary | ICD-10-CM | POA: Diagnosis not present

## 2016-09-21 DIAGNOSIS — E1165 Type 2 diabetes mellitus with hyperglycemia: Secondary | ICD-10-CM | POA: Diagnosis not present

## 2016-09-21 DIAGNOSIS — I10 Essential (primary) hypertension: Secondary | ICD-10-CM | POA: Diagnosis not present

## 2016-09-21 NOTE — Patient Instructions (Signed)
You are doing great  No real changes.  Continue the vitamin D Have a great new year! See me again in 7-8 weeks

## 2016-09-21 NOTE — Assessment & Plan Note (Signed)
Decision today to treat with OMT was based on Physical Exam  After verbal consent patient was treated with HVLA, ME,  techniques in thoracic, lumbar and sacral areas  Patient tolerated the procedure well with improvement in symptoms  Patient given exercises, stretches and lifestyle modifications  See medications in patient instructions if given  Patient will follow up in 8 weeks    

## 2016-09-21 NOTE — Assessment & Plan Note (Signed)
Patient is doing relatively well at this time. Encourage patient continued to be active. Patient will continue with the same medications. Encourage him to come to work on core strength. Follow-up again in 8 weeks.

## 2016-10-11 DIAGNOSIS — J069 Acute upper respiratory infection, unspecified: Secondary | ICD-10-CM | POA: Diagnosis not present

## 2016-10-30 ENCOUNTER — Other Ambulatory Visit: Payer: Self-pay | Admitting: Family Medicine

## 2016-10-31 NOTE — Telephone Encounter (Signed)
Refill done.  

## 2016-11-02 ENCOUNTER — Other Ambulatory Visit: Payer: Self-pay | Admitting: Family Medicine

## 2016-11-02 NOTE — Telephone Encounter (Signed)
Refill done.  

## 2016-11-08 ENCOUNTER — Telehealth (HOSPITAL_COMMUNITY): Payer: Self-pay | Admitting: Family Medicine

## 2016-11-08 ENCOUNTER — Encounter (HOSPITAL_COMMUNITY): Payer: Self-pay | Admitting: Family Medicine

## 2016-11-08 NOTE — Progress Notes (Signed)
Mailed letter with [facility] along with my chart message... KJ  °

## 2016-11-10 ENCOUNTER — Encounter: Payer: Self-pay | Admitting: Family Medicine

## 2016-11-10 ENCOUNTER — Ambulatory Visit (INDEPENDENT_AMBULATORY_CARE_PROVIDER_SITE_OTHER): Payer: BLUE CROSS/BLUE SHIELD | Admitting: Family Medicine

## 2016-11-10 VITALS — BP 110/72 | HR 72 | Ht 69.0 in | Wt 245.0 lb

## 2016-11-10 DIAGNOSIS — M999 Biomechanical lesion, unspecified: Secondary | ICD-10-CM | POA: Diagnosis not present

## 2016-11-10 DIAGNOSIS — M5136 Other intervertebral disc degeneration, lumbar region: Secondary | ICD-10-CM | POA: Diagnosis not present

## 2016-11-10 NOTE — Progress Notes (Signed)
[name] [address] [place], [place] [address] Phone: [phone] Subjective:    I'm seeing this patient by the request  of:  Leanor Rubenstein, MD    CC: Left knee pain f/u Low back pain follow-up  [id number]  Tim Cook is a 56 y.o. male coming in with complaint of left knee pain. Month ago and did have more of a popliteal tendinitis as well as a small meniscal tear. Patient was continuing to have instability. Patient was sent for an MRI of the knee. MRI showed bilateral small degenerative meniscal tears but also moderate arthritic changes of the knee. Patient also did have a 7 mm degenerative focus of the subcortical marrow edema along the medial femoral condyle that could be an early OCD. Patient states that he is doing very well overall. Still mild instability but nothing severe. Able to do daily activities just fine.  Patient is also having lower back pain. Past medical history significant for degenerative disc disease. Patient was seen previously and had had further workup from an outside facility. Patient though has been responding fairly well to manipulation. Patient states that this seems to last a proximal wing 4-6 weeks most the time. Last 2 weeks to have some mild worsening symptoms. Patient denies any radiation of tingling, any numbness. Does not stop him from activity. Worse with standing in one position for a long amount of time.    Past Medical History:  Diagnosis Date  . Arthritis    "knees, lower back, hands" (05/18/2016)  . CAD (coronary artery disease), native coronary artery    05/18/16  Cath severe 90% LAD prox, 99% mid, Circ dominant with 30% OM1, 50% OM2, 30% OM3, 60% distal circ and PL, severe stenosis nondominant RCA.  EF 55%  3.0x 20, 2.5 x 20 mm, 2.25 x 16 mm Promus premier sent to [facility] Dr. Clifton James   . Chronic bronchitis (HCC)    "most years" (05/18/2016)  . Chronic lower back pain   . DDD (degenerative disc disease), lumbar    . Hyperlipidemia   . Hypertension   . Sleep apnea    "dx'd; never wore mask" (05/18/2016)  . Squamous carcinoma    cut off right upper arm, left shoulder; froze off nose and right side of face  . Type II diabetes mellitus (HCC)   . Unstable angina (HCC)   . Vomiting    Past Surgical History:  Procedure Laterality Date  . CARDIAC CATHETERIZATION N/A 05/18/2016   Procedure: Left Heart Cath and Coronary Angiography;  Surgeon: Kathleene Hazel, MD;  Location: [facility] INVASIVE CV LAB;  Service: Cardiovascular;  Laterality: N/A;  . CARDIAC CATHETERIZATION N/A 05/18/2016   Procedure: Coronary Stent Intervention;  Surgeon: Kathleene Hazel, MD;  Location: [facility] INVASIVE CV LAB;  Service: Cardiovascular;  Laterality: N/A;  . CORONARY ANGIOPLASTY    . FOOT FRACTURE SURGERY Right 1970s  . LAPAROSCOPIC GASTRIC BANDING  07/2007   by Dr. Daphine Deutscher  . PILONIDAL CYST EXCISION  ~ 1985   "took 8 out all at once"  . SQUAMOUS CELL CARCINOMA EXCISION     right upper arm, left shoulder   Social History   Social History  . Marital status: Married    Spouse name: N/A  . Number of children: N/A  . Years of education: N/A   Social History Main Topics  . Smoking status: Former Smoker    Packs/day: 0.50    Years: 12.00    Types: Cigarettes  Quit date: 12/15/1991  . Smokeless tobacco: Former NeurosurgeonUser    Types: Snuff     Comment: "quit snuff in ~ 1981"  . Alcohol use 4.2 oz/week    7 Cans of beer per week  . Drug use: No  . Sexual activity: Yes   Other Topics Concern  . None   Social History Narrative  . None   No Known Allergies No family history on file. No known autoimmune disease  Past medical history, social, surgical and family history all reviewed in electronic medical record.  No pertanent information unless stated regarding to the chief complaint.   Review of Systems: No headache, visual changes, nausea, vomiting, diarrhea, constipation, dizziness, abdominal pain, skin rash, fevers,  chills, night sweats, weight loss, swollen lymph nodes, body aches, joint swelling, muscle aches, chest pain, shortness of breath, mood changes.      Objective  Blood pressure 110/72, pulse 72, height 5\' 9"  (1.753 m), weight 245 lb (111.1 kg).  Systems examined below as of 11/10/16 General: NAD A&O x3 mood, affect normal  HEENT: Pupils equal, extraocular movements intact no nystagmus Respiratory: not short of breath at rest or with speaking Cardiovascular: No lower extremity edema, non tender Skin: Warm dry intact with no signs of infection or rash on extremities or on axial skeleton. Abdomen: Soft nontender, no masses Neuro: Cranial nerves  intact, neurovascularly intact in all extremities with 2+ DTRs and 2+ pulses. Lymph: No lymphadenopathy appreciated today  Gait normal with good balance and coordination.  MSK: Non tender with full range of motion and good stability and symmetric strength and tone of shoulders, elbows, wrist,  hips and ankles bilaterally.   Knee: Left valgus deformity noted. Large thigh to calf ratio.  Tender to palpation over medial and PF joint line.  ROM full in flexion and extension and lower leg rotation. instability with valgus force.  painful patellar compression. Patellar glide with moderate crepitus. Patellar and quadriceps tendons unremarkable. Hamstring and quadriceps strength is normal. Contralateral knee unremarkable  Back Exam:  Inspection: Unremarkable  Motion: Flexion 45 deg, Extension 25 deg, Side Bending to 45 deg bilaterally,  Rotation to 45 deg bilaterally  SLR laying: Negative  XSLR laying: Negative  Palpable tenderness: Tender to palpation at the lumbosacral area bilaterally. FABER: negative. Sensory change: Gross sensation intact to all lumbar and sacral dermatomes.  Reflexes: 2+ at both patellar tendons, 2+ at achilles tendons, Babinski's downgoing.  Strength at foot  Plantar-flexion: 5/5 Dorsi-flexion: 5/5 Eversion: 5/5 Inversion:  5/5  Leg strength  Quad: 5/5 Hamstring: 5/5 Hip flexor: 5/5 Hip abductors: 5/5  Gait unremarkable.   Osteopathic findings Cervical C2 flexed rotated and side bent left T3 extended rotated and side bent right inhaled third rib T9 extended rotated and side bent right L2 flexed rotated and side bent right Sacrum right on right   Impression and Recommendations:     This case required medical decision making of moderate complexity.      Note: This dictation was prepared with Dragon dictation along with smaller phrase technology. Any transcriptional errors that result from this process are unintentional.

## 2016-11-10 NOTE — Assessment & Plan Note (Signed)
Decision today to treat with OMT was based on Physical Exam  After verbal consent patient was treated with HVLA, ME, FPR techniques in cervical, thoracic, lumbar and sacral areas  Patient tolerated the procedure well with improvement in symptoms  Patient given exercises, stretches and lifestyle modifications  See medications in patient instructions if given  Patient will follow up in 8 weeks 

## 2016-11-10 NOTE — Assessment & Plan Note (Signed)
Stable overall. Encourage core strengthening. Encourage weight loss. Encourage patient to continue to watch lifting mechanics and ergonomics at work. Patient has responded well to manipulation. No change in medications. Follow-up again in 8 weeks

## 2016-11-10 NOTE — Patient Instructions (Signed)
God to see you  Overall doing great  Don't change a thing.  I would say another 8 weeks!

## 2017-01-05 ENCOUNTER — Ambulatory Visit: Payer: BLUE CROSS/BLUE SHIELD | Admitting: Family Medicine

## 2017-01-13 ENCOUNTER — Telehealth (HOSPITAL_COMMUNITY): Payer: Self-pay | Admitting: Family Medicine

## 2017-01-13 NOTE — Telephone Encounter (Signed)
° °  Pt called wanted to see about our CRP pulled up his insurance benefits verified through [employer], Copy $15, Coinsurance 20%, Deductible $500.00 pt has met $31.05 Out of Pocket $2000.00, pt has met $226.27. Pt declined due to insurance hasn't been met.... Reference [phone]... KJ

## 2017-01-24 DIAGNOSIS — I2510 Atherosclerotic heart disease of native coronary artery without angina pectoris: Secondary | ICD-10-CM | POA: Diagnosis not present

## 2017-01-24 DIAGNOSIS — E785 Hyperlipidemia, unspecified: Secondary | ICD-10-CM | POA: Diagnosis not present

## 2017-01-24 DIAGNOSIS — Z8249 Family history of ischemic heart disease and other diseases of the circulatory system: Secondary | ICD-10-CM | POA: Diagnosis not present

## 2017-01-24 DIAGNOSIS — I10 Essential (primary) hypertension: Secondary | ICD-10-CM | POA: Diagnosis not present

## 2017-01-29 ENCOUNTER — Other Ambulatory Visit: Payer: Self-pay | Admitting: Family Medicine

## 2017-01-31 NOTE — Progress Notes (Signed)
Tawana ScaleZach Bettylee Feig D.O. [facility] [address] [place], [place] [address] Phone: [phone]([phone] Subjective:    I'm seeing this patient by the request  of:  Deatra JamesSun, Vyvyan, MD    CC: Left knee pain f/u Low back pain follow-up  [id number]:Subjective  Tim FleetGeorge H Cook is a 56 y.o. male coming in with complaint of left knee pain. Month ago and did have more of a popliteal tendinitis as well as a small meniscal tear. Patient was continuing to have instability. Patient was sent for an MRI of the knee. MRI showed bilateral small degenerative meniscal tears but also moderate arthritic changes of the knee. Patient also did have a 7 mm degenerative focus of the subcortical marrow edema along the medial femoral condyle that could be an early OCD. Patient was doing well with conservative therapy. Patient states knee is doing well    Patient is also having lower back pain.  Has responded well to OMT previously patient states having some increasing tightness recently. Denies Any recent injury. Patient was having unfortunately more tightness of the lower back. Denies any radiation down the legs any numbness.    Past Medical History:  Diagnosis Date  . Arthritis    "knees, lower back, hands" (05/18/2016)  . CAD (coronary artery disease), native coronary artery    05/18/16  Cath severe 90% LAD prox, 99% mid, Circ dominant with 30% OM1, 50% OM2, 30% OM3, 60% distal circ and PL, severe stenosis nondominant RCA.  EF 55%  3.0x 20, 2.5 x 20 mm, 2.25 x 16 mm Promus premier sent to L[facility] Dr. Clifton JamesMcALhany   . Chronic bronchitis (HCC)    "most years" (05/18/2016)  . Chronic lower back pain   . DDD (degenerative disc disease), lumbar   . Hyperlipidemia   . Hypertension   . Sleep apnea    "dx'd; never wore mask" (05/18/2016)  . Squamous carcinoma    cut off right upper arm, left shoulder; froze off nose and right side of face  . Type II diabetes mellitus (HCC)   . Unstable angina (HCC)   . Vomiting    Past Surgical  History:  Procedure Laterality Date  . CARDIAC CATHETERIZATION N/A 05/18/2016   Procedure: Left Heart Cath and Coronary Angiography;  Surgeon: Kathleene Hazelhristopher D McAlhany, MD;  [facility];  Service: Cardiovascular;  Laterality: N/A;  . CARDIAC CATHETERIZATION N/A 05/18/2016   Procedure: Coronary Stent Intervention;  Surgeon: Kathleene Hazelhristopher D McAlhany, MD;  Location: [facility] INVASIVE CV LAB;  Service: Cardiovascular;  Laterality: N/A;  . CORONARY ANGIOPLASTY    . FOOT FRACTURE SURGERY Right 1970s  . LAPAROSCOPIC GASTRIC BANDING  07/2007   by Dr. Daphine DeutscherMartin  . PILONIDAL CYST EXCISION  ~ 1985   "took 8 out all at once"  . SQUAMOUS CELL CARCINOMA EXCISION     right upper arm, left shoulder   Social History   Social History  . Marital status: Married    Spouse name: N/A  . Number of children: N/A  . Years of education: N/A   Social History Main Topics  . Smoking status: Former Smoker    Packs/day: 0.50    Years: 12.00    Types: Cigarettes    Quit date: 12/15/1991  . Smokeless tobacco: Former NeurosurgeonUser    Types: Snuff     Comment: "quit snuff in ~ 1981"  . Alcohol use 4.2 oz/week    7 Cans of beer per week  . Drug use: No  . Sexual activity:  Yes   Other Topics Concern  . Not on file   Social History Narrative  . No narrative on file   No Known Allergies No family history on file. No known autoimmune disease  Past medical history, social, surgical and family history all reviewed in electronic medical record.  No pertanent information unless stated regarding to the chief complaint.   Review of Systems: No headache, visual changes, nausea, vomiting, diarrhea, constipation, dizziness, abdominal pain, skin rash, fevers, chills, night sweats, weight loss, swollen lymph nodes, body aches, joint swelling, muscle aches, chest pain, shortness of breath, mood changes.       Objective  There were no vitals taken for this visit.  Systems examined below as of 02/01/17 General: NAD A&O x3  mood, affect normal  HEENT: Pupils equal, extraocular movements intact no nystagmus Respiratory: not short of breath at rest or with speaking Cardiovascular: No lower extremity edema, non tender Skin: Warm dry intact with no signs of infection or rash on extremities or on axial skeleton. Abdomen: Soft nontender, no masses Neuro: Cranial nerves  intact, neurovascularly intact in all extremities with 2+ DTRs and 2+ pulses. Lymph: No lymphadenopathy appreciated today  Gait normal with good balance and coordination.  MSK: Non tender with full range of motion and good stability and symmetric strength and tone of shoulders, elbows, wrist,  hips and ankles bilaterally.   Knee: Left valgus deformity noted. tEnder over the patellofemoral joint but minor ROM full in flexion and extension and lower leg rotation. instability with valgus force.  painful patellar compression. Patellar glide with moderate crepitus. Patellar and quadriceps tendons unremarkable. Hamstring and quadriceps strength is normal. Contralateral knee unremarkable  Back Exam:  Inspection: Unremarkable  Motion: Flexion 45 deg, Extension 25 deg, Side Bending to 35 deg bilaterally,  Rotation to 35 deg bilaterally  SLR laying: Negative  XSLR laying: Negative  Palpable tenderness: Tender to palpation of the paraspinal musculature lumbar spine. More than previous exam with tighter hip flexors. FABER: negative. Sensory change: Gross sensation intact to all lumbar and sacral dermatomes.  Reflexes: 2+ at both patellar tendons, 2+ at achilles tendons, Babinski's downgoing.  Strength at foot  Plantar-flexion: 5/5 Dorsi-flexion: 5/5 Eversion: 5/5 Inversion: 5/5  Leg strength  Quad: 5/5 Hamstring: 5/5 Hip flexor: 5/5 Hip abductors: 5/5  Gait unremarkable.  Osteopathic findings Cervical C2 flexed rotated and side bent right C4 flexed rotated and side bent left T3 extended rotated and side bent right inhaled third rib T7 extended  rotated and side bent left L2 flexed rotated and side bent right Sacrum right on right    Impression and Recommendations:     This case required medical decision making of moderate complexity.      Note: This dictation was prepared with Dragon dictation along with smaller phrase technology. Any transcriptional errors that result from this process are unintentional.

## 2017-02-01 ENCOUNTER — Ambulatory Visit (INDEPENDENT_AMBULATORY_CARE_PROVIDER_SITE_OTHER): Payer: BLUE CROSS/BLUE SHIELD | Admitting: Family Medicine

## 2017-02-01 ENCOUNTER — Encounter: Payer: Self-pay | Admitting: Family Medicine

## 2017-02-01 VITALS — BP 106/62 | HR 103 | Resp 16 | Wt 248.0 lb

## 2017-02-01 DIAGNOSIS — M999 Biomechanical lesion, unspecified: Secondary | ICD-10-CM

## 2017-02-01 DIAGNOSIS — M5136 Other intervertebral disc degeneration, lumbar region: Secondary | ICD-10-CM | POA: Diagnosis not present

## 2017-02-01 NOTE — Assessment & Plan Note (Signed)
Stable overall.  Patient has responded very well to conservative therapy. We discussed home exercises, we discussed the importance of core strengthening the importance of weight loss. Patient does well with the exercises. Encourage patient do stretching after the repetitive movements. Follow-up again in 8 weeks

## 2017-02-01 NOTE — Patient Instructions (Addendum)
Good to see you  Tim Cook is your friend.  Stay active.  Add a multi vitamin daily for next month to help with muscle spasms.  Heat before activity and ice after activity  TENS unti j10 minuts at night  See me again in 6-8 weeks or end of the month of work if you want.

## 2017-02-01 NOTE — Assessment & Plan Note (Signed)
Decision today to treat with OMT was based on Physical Exam  After verbal consent patient was treated with HVLA, ME, FPR techniques in cervical, thoracic, lumbar and sacral areas  Patient tolerated the procedure well with improvement in symptoms  Patient given exercises, stretches and lifestyle modifications  See medications in patient instructions if given  Patient will follow up in 8 weeks 

## 2017-03-15 ENCOUNTER — Encounter: Payer: Self-pay | Admitting: Family Medicine

## 2017-03-15 ENCOUNTER — Ambulatory Visit (INDEPENDENT_AMBULATORY_CARE_PROVIDER_SITE_OTHER): Payer: BLUE CROSS/BLUE SHIELD | Admitting: Family Medicine

## 2017-03-15 VITALS — BP 118/82 | HR 84 | Ht 69.0 in | Wt 246.0 lb

## 2017-03-15 DIAGNOSIS — M5136 Other intervertebral disc degeneration, lumbar region: Secondary | ICD-10-CM

## 2017-03-15 DIAGNOSIS — M999 Biomechanical lesion, unspecified: Secondary | ICD-10-CM | POA: Diagnosis not present

## 2017-03-15 NOTE — Patient Instructions (Addendum)
Always good to see you  Continue the vitamins at this time.  8 weeks!

## 2017-03-15 NOTE — Progress Notes (Signed)
[name] [address] [place], [place] [address] Phone: [phone] Subjective:    I'm seeing this patient by the request  of:  Deatra James, MD    CC: Left knee pain f/u Low back pain follow-up  [id number]  Tim Cook is a 56 y.o. male coming in with complaint of left knee pain. Month ago and did have more of a popliteal tendinitis as well as a small meniscal tear.Patient did instability and MRI did show an early OCD. Patient has been doing well with conservative therapy and once weekly vitamin D, stable overall.    Patient is also having lower back pain.  Has responded well to OMT previously. Patient at last exam patient was having some increasing. Patient and not doing exercises as regularly. Patient states known degenerative disc disease mostly from L3-S1. Patient states he has started doing some of the multivitamin. Patient states doing relatively well. Some mild tightness. Seems to have some mild right-sided back pain. Nothing severe though. There is able to start increasing activity as tolerated.  Past Medical History:  Diagnosis Date  . Arthritis    "knees, lower back, hands" (05/18/2016)  . CAD (coronary artery disease), native coronary artery    05/18/16  Cath severe 90% LAD prox, 99% mid, Circ dominant with 30% OM1, 50% OM2, 30% OM3, 60% distal circ and PL, severe stenosis nondominant RCA.  EF 55%  3.0x 20, 2.5 x 20 mm, 2.25 x 16 mm Promus premier sent to LAD Dr. Clifton James   . Chronic bronchitis (HCC)    "most years" (05/18/2016)  . Chronic lower back pain   . DDD (degenerative disc disease), lumbar   . Hyperlipidemia   . Hypertension   . Sleep apnea    "dx'd; never wore mask" (05/18/2016)  . Squamous carcinoma    cut off right upper arm, left shoulder; froze off nose and right side of face  . Type II diabetes mellitus (HCC)   . Unstable angina (HCC)   . Vomiting    Past Surgical History:  Procedure Laterality Date  . CARDIAC  CATHETERIZATION N/A 05/18/2016   Procedure: Left Heart Cath and Coronary Angiography;  Surgeon: Kathleene Hazel, MD;  Location: [facility] INVASIVE CV LAB;  Service: Cardiovascular;  Laterality: N/A;  . CARDIAC CATHETERIZATION N/A 05/18/2016   Procedure: Coronary Stent Intervention;  Surgeon: Kathleene Hazel, MD;  Location: [facility] INVASIVE CV LAB;  Service: Cardiovascular;  Laterality: N/A;  . CORONARY ANGIOPLASTY    . FOOT FRACTURE SURGERY Right 1970s  . LAPAROSCOPIC GASTRIC BANDING  07/2007   by Dr. Daphine Deutscher  . PILONIDAL CYST EXCISION  ~ 1985   "took 8 out all at once"  . SQUAMOUS CELL CARCINOMA EXCISION     right upper arm, left shoulder   Social History   Social History  . Marital status: Married    Spouse name: N/A  . Number of children: N/A  . Years of education: N/A   Social History Main Topics  . Smoking status: Former Smoker    Packs/day: 0.50    Years: 12.00    Types: Cigarettes    Quit date: 12/15/1991  . Smokeless tobacco: Former Neurosurgeon    Types: Snuff     Comment: "quit snuff in ~ 1981"  . Alcohol use 4.2 oz/week    7 Cans of beer per week  . Drug use: No  . Sexual activity: Yes   Other Topics Concern  . None   Social  History Narrative  . None   No Known Allergies No family history on file. No known autoimmune disease  Past medical history, social, surgical and family history all reviewed in electronic medical record.  No pertanent information unless stated regarding to the chief complaint.   Review of Systems: No headache, visual changes, nausea, vomiting, diarrhea, constipation, dizziness, abdominal pain, skin rash, fevers, chills, night sweats, weight loss, swollen lymph nodes, body aches, joint swelling,  chest pain, shortness of breath, mood changes.  Positive muscle aches    Objective  Height 5\' 9"  (1.753 m).  Systems examined below as of 03/15/17 General: NAD A&O x3 mood, affect normal  HEENT: Pupils equal, extraocular movements intact no  nystagmus Respiratory: not short of breath at rest or with speaking Cardiovascular: No lower extremity edema, non tender Skin: Warm dry intact with no signs of infection or rash on extremities or on axial skeleton. Abdomen: Soft nontender, no masses Neuro: Cranial nerves  intact, neurovascularly intact in all extremities with 2+ DTRs and 2+ pulses. Lymph: No lymphadenopathy appreciated today  Gait normal with good balance and coordination.  MSK: Non tender with full range of motion and good stability and symmetric strength and tone of shoulders, elbows, wrist,  hips and ankles bilaterally.     Knee: Left Mild valgus deformity Palpation normal with no warmth, joint line tenderness, patellar tenderness, or condyle tenderness. ROM full in flexion and extension and lower leg rotation. Ligaments with solid consistent endpoints including ACL, PCL, LCL, MCL. Negative Mcmurray's, Apley's, and Thessalonian tests. Non painful patellar compression. Patellar glide without crepitus. Patellar and quadriceps tendons unremarkable. Hamstring and quadriceps strength is normal.    Back Exam:  Inspection: Unremarkable  Motion: Flexion 45 deg, Extension 35 deg, Side Bending to 35 deg bilaterally,  Rotation to 35 deg bilaterally  SLR laying: Negative  XSLR laying: Negative  Palpable tenderness: Tender to palpation of the paraspinal musculature lumbar spine. FABER: negative. Sensory change: Gross sensation intact to all lumbar and sacral dermatomes.  Reflexes: 2+ at both patellar tendons, 2+ at achilles tendons, Babinski's downgoing.  Strength at foot  Plantar-flexion: 5/5 Dorsi-flexion: 5/5 Eversion: 5/5 Inversion: 5/5  Leg strength  Quad: 5/5 Hamstring: 5/5 Hip flexor: 5/5 Hip abductors: 5/5  Gait unremarkable.   Osteopathic findings C6 flexed rotated and side bent left T3 extended rotated and side bent right inhaled third rib T9 extended rotated and side bent left L3 flexed rotated and side  bent right Sacrum right on right  Impression and Recommendations:     This case required medical decision making of moderate complexity.      Note: This dictation was prepared with Dragon dictation along with smaller phrase technology. Any transcriptional errors that result from this process are unintentional.

## 2017-03-15 NOTE — Assessment & Plan Note (Signed)
Stable overall. Discussed with patient at great length. Has responded well to osteopathic manipulation. We discussed which x-rays a doing which ones to avoid. Patient is going to start with cardiac rehabilitation medical bit beneficial. Discussed continuing the same vitamins. Follow-up again in 2 months

## 2017-03-15 NOTE — Assessment & Plan Note (Signed)
Decision today to treat with OMT was based on Physical Exam  After verbal consent patient was treated with HVLA, ME, FPR techniques in  thoracic, lumbar and sacral areas  Patient tolerated the procedure well with improvement in symptoms  Patient given exercises, stretches and lifestyle modifications  See medications in patient instructions if given  Patient will follow up in 8 weeks 

## 2017-03-22 ENCOUNTER — Telehealth (HOSPITAL_COMMUNITY): Payer: Self-pay | Admitting: Cardiac Rehabilitation

## 2017-03-22 NOTE — Telephone Encounter (Signed)
pc to pt to discuss enrolling in cardiac rehab.  Pt is interested in a department visit prior to committing to scheduling orientation. Pt is concerned that CR may not be what he would like to participate in.  Pt is also concerned that he will not be able to complete the program prior to returning to work in September. Pt states he is calling to enroll at this time under the advice of Dr. Donnie Ahoilley who highly recommends he participate.  Pt would like to come Monday March 28, 2017 to observe the gym.

## 2017-04-05 ENCOUNTER — Telehealth (HOSPITAL_COMMUNITY): Payer: Self-pay

## 2017-04-05 NOTE — Telephone Encounter (Signed)
*  Updated [place]  - $15.00 co-payment, deductible $500/$93.15 has been met, out of pocket $2000/$365.01 has been met, 20% co-insurance, no pre-authorization and no limit on visit. Passport/reference [phone].

## 2017-04-06 ENCOUNTER — Encounter (HOSPITAL_COMMUNITY): Payer: Self-pay

## 2017-04-06 ENCOUNTER — Encounter (HOSPITAL_COMMUNITY)
Admission: RE | Admit: 2017-04-06 | Discharge: 2017-04-06 | Disposition: A | Discharge status: Home / Self Care | Payer: BLUE CROSS/BLUE SHIELD | Source: Ambulatory Visit | Attending: Cardiology | Admitting: Cardiology

## 2017-04-06 VITALS — BP 114/60 | HR 86 | Ht 68.25 in | Wt 250.7 lb

## 2017-04-06 DIAGNOSIS — G8929 Other chronic pain: Secondary | ICD-10-CM | POA: Insufficient documentation

## 2017-04-06 DIAGNOSIS — I2510 Atherosclerotic heart disease of native coronary artery without angina pectoris: Secondary | ICD-10-CM | POA: Diagnosis not present

## 2017-04-06 DIAGNOSIS — Z7982 Long term (current) use of aspirin: Secondary | ICD-10-CM | POA: Insufficient documentation

## 2017-04-06 DIAGNOSIS — Z7984 Long term (current) use of oral hypoglycemic drugs: Secondary | ICD-10-CM | POA: Insufficient documentation

## 2017-04-06 DIAGNOSIS — G4730 Sleep apnea, unspecified: Secondary | ICD-10-CM | POA: Insufficient documentation

## 2017-04-06 DIAGNOSIS — M5136 Other intervertebral disc degeneration, lumbar region: Secondary | ICD-10-CM | POA: Insufficient documentation

## 2017-04-06 DIAGNOSIS — Z955 Presence of coronary angioplasty implant and graft: Secondary | ICD-10-CM

## 2017-04-06 DIAGNOSIS — Z79899 Other long term (current) drug therapy: Secondary | ICD-10-CM | POA: Diagnosis not present

## 2017-04-06 DIAGNOSIS — M1990 Unspecified osteoarthritis, unspecified site: Secondary | ICD-10-CM | POA: Insufficient documentation

## 2017-04-06 DIAGNOSIS — E119 Type 2 diabetes mellitus without complications: Secondary | ICD-10-CM | POA: Insufficient documentation

## 2017-04-06 DIAGNOSIS — Z85828 Personal history of other malignant neoplasm of skin: Secondary | ICD-10-CM | POA: Insufficient documentation

## 2017-04-06 DIAGNOSIS — Z87891 Personal history of nicotine dependence: Secondary | ICD-10-CM | POA: Insufficient documentation

## 2017-04-06 DIAGNOSIS — E785 Hyperlipidemia, unspecified: Secondary | ICD-10-CM | POA: Diagnosis not present

## 2017-04-06 DIAGNOSIS — Z7902 Long term (current) use of antithrombotics/antiplatelets: Secondary | ICD-10-CM | POA: Diagnosis not present

## 2017-04-06 DIAGNOSIS — I10 Essential (primary) hypertension: Secondary | ICD-10-CM | POA: Diagnosis not present

## 2017-04-06 NOTE — Progress Notes (Signed)
Tim Cook 56 y.o. male       Nutrition Note  1. 05/18/16 Status post coronary artery stent placement    Past Medical History:  Diagnosis Date  . Arthritis    "knees, lower back, hands" (05/18/2016)  . CAD (coronary artery disease), native coronary artery    05/18/16  Cath severe 90% LAD prox, 99% mid, Circ dominant with 30% OM1, 50% OM2, 30% OM3, 60% distal circ and PL, severe stenosis nondominant RCA.  EF 55%  3.0x 20, 2.5 x 20 mm, 2.25 x 16 mm Promus premier sent to L[facility] Dr. Clifton JamesMcALhany   . Chronic bronchitis (HCC)    "most years" (05/18/2016)  . Chronic lower back pain   . DDD (degenerative disc disease), lumbar   . Hyperlipidemia   . Hypertension   . Sleep apnea    "dx'd; never wore mask" (05/18/2016)  . Squamous carcinoma    cut off right upper arm, left shoulder; froze off nose and right side of face  . Type II diabetes mellitus (HCC)   . Unstable angina (HCC)   . Vomiting    lap band Meds reviewed. Metformin noted  HT: Ht Readings from Last 1 Encounters:  04/06/17 5' 8.25" (1.734 m)    WT: Wt Readings from Last 3 Encounters:  04/06/17 250 lb 10.6 oz (113.7 kg)  03/15/17 246 lb (111.6 kg)  02/01/17 248 lb (112.5 kg)     BMI 37.9   Current tobacco use? No  Labs:  Lipid Panel  No results found for: CHOL, TRIG, HDL, CHOLHDL, VLDL, LDLCALC, LDLDIRECT  No results found for: HGBA1C CBG (last 3)  No results for input(s): GLUCAP in the last 72 hours.  Nutrition Note Spoke with pt and pt's wife. Nutrition plan and goals reviewed with pt. Pt is following Step 1 of the Therapeutic Lifestyle Changes diet. Pt wants to lose wt but has not been actively trying to lose wt. Pt s/p lap band procedure "10 years ago." Pt states he weighed 370 lb before lap band and lost 140 lb after lap band. Pt's current wt is 120 lb less than pt's heaviest wt reported. Pt is diabetic. Pt reports his last A1c was 7.2 in 08/2016. Pt does not check CBG's regularly. "I think I need a new machine." Pt  continues drinking regular soda because "I don't like the aftertaste of diet drinks." Pt expressed understanding of the information reviewed. Pt aware of nutrition education classes offered and plans on attending nutrition classes.  Nutrition Diagnosis ? Food-and nutrition-related knowledge deficit related to lack of exposure to information as related to diagnosis of: ? CVD ? DM ? Obesity related to excessive energy intake as evidenced by a BMI of 37.9  Nutrition Intervention ? Pt's individual nutrition plan and goals reviewed with pt. ? Pt given handouts for: ? Nutrition I class ? Nutrition II class  ? Diabetes Blitz Class ? Diabetes Q & A class  ? Consistent vit K diet ? low sodium ? DM ? pre-diabetes  Nutrition Goal(s):  ? Pt to identify and limit food sources of saturated fat, trans fat, and sodium by learning what foods make up a heart healthy diet.  ? Pt to identify food quantities necessary to achieve weight loss of 6-24 lb (2.7-10.9 kg) at graduation from cardiac rehab. Goal wt of 230 lb desired.  ? Pt to learn to use different seasonings and herbs in cooking  Plan:  Pt to attend nutrition classes ? Nutrition I ? Nutrition II ?  Portion Distortion  ? Diabetes Blitz ? Diabetes Q & A Will provide client-centered nutrition education as part of interdisciplinary care.   Monitor and evaluate progress toward nutrition goal with team.  Mickle Plumb, M.Ed, RD, LDN, CDE 04/06/2017 12:41 PM

## 2017-04-06 NOTE — Progress Notes (Signed)
Cardiac Individual Treatment Plan  Patient Details  Name: Tim Cook MRN: [id number] Date of Birth: 1961-03-11 Referring Provider:     CARDIAC REHAB PHASE II ORIENTATION from 04/06/2017 in [facility] CARDIAC REHAB  Referring Provider  Viann Fish MD      Initial Encounter Date:    CARDIAC REHAB PHASE II ORIENTATION from 04/06/2017 in [facility] CARDIAC REHAB  Date  04/06/17  Referring Provider  Viann Fish MD      Visit Diagnosis: 05/18/16 Status post coronary artery stent placement  Patient's Home Medications on Admission:  Current Outpatient Prescriptions:  .  aspirin EC 81 MG tablet, Take 81 mg by mouth daily., Disp: , Rfl:  .  atorvastatin (LIPITOR) 40 MG tablet, , Disp: , Rfl:  .  loratadine (CLARITIN) 10 MG tablet, Take 10 mg by mouth daily., Disp: , Rfl: 5 .  losartan (COZAAR) 100 MG tablet, 100 mg. , Disp: , Rfl:  .  metFORMIN (GLUCOPHAGE) 500 MG tablet, Take 1,000 mg by mouth 2 (two) times daily with a meal. , Disp: , Rfl:  .  metoprolol tartrate (LOPRESSOR) 25 MG tablet, Take 25 mg by mouth 2 (two) times daily. , Disp: , Rfl:  .  Multiple Vitamin (MULTIVITAMIN) capsule, Take 1 capsule by mouth daily., Disp: , Rfl:  .  nitroGLYCERIN (NITROSTAT) 0.4 MG SL tablet, Place 1 tablet (0.4 mg total) under the tongue every 5 (five) minutes as needed for chest pain., Disp: 25 tablet, Rfl: 3 .  ticagrelor (BRILINTA) 90 MG TABS tablet, Take 1 tablet (90 mg total) by mouth 2 (two) times daily., Disp: 60 tablet, Rfl: 11 .  Vitamin D, Ergocalciferol, (DRISDOL) 50000 units CAPS capsule, TAKE ONE CAPSULE BY MOUTH EVERY 7 DAYS, Disp: 12 capsule, Rfl: 0 .  gabapentin (NEURONTIN) 100 MG capsule, TAKE 2 CAPSULES BY MOUTH AT BEDTIME (Patient not taking: Reported on 04/06/2017), Disp: 180 capsule, Rfl: 1  Past Medical History: Past Medical History:  Diagnosis Date  . Arthritis    "knees, lower back, hands" (05/18/2016)  . CAD (coronary artery  disease), native coronary artery    05/18/16  Cath severe 90% LAD prox, 99% mid, Circ dominant with 30% OM1, 50% OM2, 30% OM3, 60% distal circ and PL, severe stenosis nondominant RCA.  EF 55%  3.0x 20, 2.5 x 20 mm, 2.25 x 16 mm Promus premier sent to LAD Dr. Clifton James   . Chronic bronchitis (HCC)    "most years" (05/18/2016)  . Chronic lower back pain   . DDD (degenerative disc disease), lumbar   . Hyperlipidemia   . Hypertension   . Sleep apnea    "dx'd; never wore mask" (05/18/2016)  . Squamous carcinoma    cut off right upper arm, left shoulder; froze off nose and right side of face  . Type II diabetes mellitus (HCC)   . Unstable angina (HCC)   . Vomiting     Tobacco Use: History  Smoking Status  . Former Smoker  . Packs/day: 0.50  . Years: 12.00  . Types: Cigarettes  . Quit date: 12/15/1991  Smokeless Tobacco  . Former Neurosurgeon  . Types: Snuff    Comment: "quit snuff in ~ 1981"    Labs: Recent Review Flowsheet Data    There is no flowsheet data to display.      Capillary Blood Glucose: Lab Results  Component Value Date   GLUCAP 141 (H) 05/19/2016   GLUCAP 211 (H) 05/18/2016   GLUCAP 140 (H)  05/18/2016   GLUCAP 147 (H) 05/18/2016   GLUCAP 197 (H) 05/18/2016     Exercise Target Goals: Date: 04/06/17  Exercise Program Goal: Individual exercise prescription set with THRR, safety & activity barriers. Participant demonstrates ability to understand and report RPE using BORG scale, to self-measure pulse accurately, and to acknowledge the importance of the exercise prescription.  Exercise Prescription Goal: Starting with aerobic activity 30 plus minutes a day, 3 days per week for initial exercise prescription. Provide home exercise prescription and guidelines that participant acknowledges understanding prior to discharge.  Activity Barriers & Risk Stratification:     Activity Barriers & Cardiac Risk Stratification - 04/06/17 1001      Activity Barriers & Cardiac Risk  Stratification   Activity Barriers Other (comment)   Comments left knee and gout   Cardiac Risk Stratification Moderate      6 Minute Walk:     6 Minute Walk    Row Name 04/06/17 1032         6 Minute Walk   Phase Initial     Distance 1479 feet     Walk Time 6 minutes     # of Rest Breaks 0     MPH 2.8     METS 3.45     RPE 11     VO2 Peak 12.08     Symptoms No     Resting HR 86 bpm     Resting BP 114/60     Max Ex. HR 102 bpm     Max Ex. BP 124/64     2 Minute Post BP 122/70        Oxygen Initial Assessment:   Oxygen Re-Evaluation:   Oxygen Discharge (Final Oxygen Re-Evaluation):   Initial Exercise Prescription:     Initial Exercise Prescription - 04/06/17 1100      Date of Initial Exercise RX and Referring Provider   Date 04/06/17   Referring Provider Viann Fish MD     Recumbant Bike   Level 1.5   Minutes 10   METs 2.5     NuStep   Level 3   SPM 85   Minutes 10   METs 2.1     Track   Laps 10   Minutes 10   METs 2.74     Prescription Details   Frequency (times per week) 2   Duration Progress to 45 minutes of aerobic exercise without signs/symptoms of physical distress     Intensity   THRR 40-80% of Max Heartrate 66-133   Ratings of Perceived Exertion 11-13   Perceived Dyspnea 0-4     Progression   Progression Continue to progress workloads to maintain intensity without signs/symptoms of physical distress.     Resistance Training   Training Prescription Yes   Weight 3   Reps 10-15      Perform Capillary Blood Glucose checks as needed.  Exercise Prescription Changes:   Exercise Comments:   Exercise Goals and Review:     Exercise Goals    Row Name 04/06/17 1002             Exercise Goals   Increase Physical Activity Yes       Intervention Provide advice, education, support and counseling about physical activity/exercise needs.;Develop an individualized exercise prescription for aerobic and resistive training  based on initial evaluation findings, risk stratification, comorbidities and participant's personal goals.       Expected Outcomes Achievement of increased cardiorespiratory fitness and enhanced flexibility, muscular  endurance and strength shown through measurements of functional capacity and personal statement of participant.       Increase Strength and Stamina Yes       Intervention Provide advice, education, support and counseling about physical activity/exercise needs.;Develop an individualized exercise prescription for aerobic and resistive training based on initial evaluation findings, risk stratification, comorbidities and participant's personal goals.       Expected Outcomes Achievement of increased cardiorespiratory fitness and enhanced flexibility, muscular endurance and strength shown through measurements of functional capacity and personal statement of participant.          Exercise Goals Re-Evaluation :    Discharge Exercise Prescription (Final Exercise Prescription Changes):   Nutrition:  Target Goals: Understanding of nutrition guidelines, daily intake of sodium 1500mg , cholesterol 200mg , calories 30% from fat and 7% or less from saturated fats, daily to have 5 or more servings of fruits and vegetables.  Biometrics:     Pre Biometrics - 04/06/17 1218      Pre Biometrics   Height 5' 8.25" (1.734 m)   Weight 250 lb 10.6 oz (113.7 kg)   Waist Circumference 44.25 inches   Hip Circumference 45.75 inches   Waist to Hip Ratio 0.97 %   BMI (Calculated) 37.9   Triceps Skinfold 28 mm   % Body Fat 35.4 %   Grip Strength 42 kg   Flexibility 9 in   Single Leg Stand 30 seconds       Nutrition Therapy Plan and Nutrition Goals:   Nutrition Discharge: Nutrition Scores:   Nutrition Goals Re-Evaluation:   Nutrition Goals Re-Evaluation:   Nutrition Goals Discharge (Final Nutrition Goals Re-Evaluation):   Psychosocial: Target Goals: Acknowledge presence or absence  of significant depression and/or stress, maximize coping skills, provide positive support system. Participant is able to verbalize types and ability to use techniques and skills needed for reducing stress and depression.  Initial Review & Psychosocial Screening:     Initial Psych Review & Screening - 04/06/17 1202      Initial Review   Current issues with None Identified     Family Dynamics   Good Support System? Yes     Barriers   Psychosocial barriers to participate in program There are no identifiable barriers or psychosocial needs.     Screening Interventions   Interventions Encouraged to exercise      Quality of Life Scores:     Quality of Life - 04/06/17 1156      Quality of Life Scores   Health/Function Pre 27.8 %   Socioeconomic Pre 25.07 %   Psych/Spiritual Pre 24.64 %   Family Pre 28.8 %   GLOBAL Pre 24.53 %      PHQ-9: Recent Review Flowsheet Data    There is no flowsheet data to display.     Interpretation of Total Score  Total Score Depression Severity:  1-4 = Minimal depression, 5-9 = Mild depression, 10-14 = Moderate depression, 15-19 = Moderately severe depression, 20-27 = Severe depression   Psychosocial Evaluation and Intervention:   Psychosocial Re-Evaluation:   Psychosocial Discharge (Final Psychosocial Re-Evaluation):   Vocational Rehabilitation: Provide vocational rehab assistance to qualifying candidates.   Vocational Rehab Evaluation & Intervention:     Vocational Rehab - 04/06/17 1201      Initial Vocational Rehab Evaluation & Intervention   Assessment shows need for Vocational Rehabilitation No      Education: Education Goals: Education classes will be provided on a weekly basis, covering required topics. Participant will  state understanding/return demonstration of topics presented.  Learning Barriers/Preferences:     Learning Barriers/Preferences - 04/06/17 7829      Learning Barriers/Preferences   Learning Barriers  None   Learning Preferences Skilled Demonstration;Pictoral;Written Material      Education Topics: Count Your Pulse:  -Group instruction provided by verbal instruction, demonstration, patient participation and written materials to support subject.  Instructors address importance of being able to find your pulse and how to count your pulse when at home without a heart monitor.  Patients get hands on experience counting their pulse with staff help and individually.   Heart Attack, Angina, and Risk Factor Modification:  -Group instruction provided by verbal instruction, video, and written materials to support subject.  Instructors address signs and symptoms of angina and heart attacks.    Also discuss risk factors for heart disease and how to make changes to improve heart health risk factors.   Functional Fitness:  -Group instruction provided by verbal instruction, demonstration, patient participation, and written materials to support subject.  Instructors address safety measures for doing things around the house.  Discuss how to get up and down off the floor, how to pick things up properly, how to safely get out of a chair without assistance, and balance training.   Meditation and Mindfulness:  -Group instruction provided by verbal instruction, patient participation, and written materials to support subject.  Instructor addresses importance of mindfulness and meditation practice to help reduce stress and improve awareness.  Instructor also leads participants through a meditation exercise.    Stretching for Flexibility and Mobility:  -Group instruction provided by verbal instruction, patient participation, and written materials to support subject.  Instructors lead participants through series of stretches that are designed to increase flexibility thus improving mobility.  These stretches are additional exercise for major muscle groups that are typically performed during regular warm up and cool  down.   Hands Only CPR:  -Group verbal, video, and participation provides a basic overview of AHA guidelines for community CPR. Role-play of emergencies allow participants the opportunity to practice calling for help and chest compression technique with discussion of AED use.   Hypertension: -Group verbal and written instruction that provides a basic overview of hypertension including the most recent diagnostic guidelines, risk factor reduction with self-care instructions and medication management.    Nutrition I class: Heart Healthy Eating:  -Group instruction provided by PowerPoint slides, verbal discussion, and written materials to support subject matter. The instructor gives an explanation and review of the Therapeutic Lifestyle Changes diet recommendations, which includes a discussion on lipid goals, dietary fat, sodium, fiber, plant stanol/sterol esters, sugar, and the components of a well-balanced, healthy diet.   Nutrition II class: Lifestyle Skills:  -Group instruction provided by PowerPoint slides, verbal discussion, and written materials to support subject matter. The instructor gives an explanation and review of label reading, grocery shopping for heart health, heart healthy recipe modifications, and ways to make healthier choices when eating out.   Diabetes Question & Answer:  -Group instruction provided by PowerPoint slides, verbal discussion, and written materials to support subject matter. The instructor gives an explanation and review of diabetes co-morbidities, pre- and post-prandial blood glucose goals, pre-exercise blood glucose goals, signs, symptoms, and treatment of hypoglycemia and hyperglycemia, and foot care basics.   Diabetes Blitz:  -Group instruction provided by PowerPoint slides, verbal discussion, and written materials to support subject matter. The instructor gives an explanation and review of the physiology behind type 1 and type 2  diabetes, diabetes  medications and rational behind using different medications, pre- and post-prandial blood glucose recommendations and Hemoglobin A1c goals, diabetes diet, and exercise including blood glucose guidelines for exercising safely.    Portion Distortion:  -Group instruction provided by PowerPoint slides, verbal discussion, written materials, and food models to support subject matter. The instructor gives an explanation of serving size versus portion size, changes in portions sizes over the last 20 years, and what consists of a serving from each food group.   Stress Management:  -Group instruction provided by verbal instruction, video, and written materials to support subject matter.  Instructors review role of stress in heart disease and how to cope with stress positively.     Exercising on Your Own:  -Group instruction provided by verbal instruction, power point, and written materials to support subject.  Instructors discuss benefits of exercise, components of exercise, frequency and intensity of exercise, and end points for exercise.  Also discuss use of nitroglycerin and activating EMS.  Review options of places to exercise outside of rehab.  Review guidelines for sex with heart disease.   Cardiac Drugs I:  -Group instruction provided by verbal instruction and written materials to support subject.  Instructor reviews cardiac drug classes: antiplatelets, anticoagulants, beta blockers, and statins.  Instructor discusses reasons, side effects, and lifestyle considerations for each drug class.   Cardiac Drugs II:  -Group instruction provided by verbal instruction and written materials to support subject.  Instructor reviews cardiac drug classes: angiotensin converting enzyme inhibitors (ACE-I), angiotensin II receptor blockers (ARBs), nitrates, and calcium channel blockers.  Instructor discusses reasons, side effects, and lifestyle considerations for each drug class.   Anatomy and Physiology of the  Circulatory System:  Group verbal and written instruction and models provide basic cardiac anatomy and physiology, with the coronary electrical and arterial systems. Review of: AMI, Angina, Valve disease, Heart Failure, Peripheral Artery Disease, Cardiac Arrhythmia, Pacemakers, and the ICD.   Other Education:  -Group or individual verbal, written, or video instructions that support the educational goals of the cardiac rehab program.   Knowledge Questionnaire Score:     Knowledge Questionnaire Score - 04/06/17 1200      Knowledge Questionnaire Score   Pre Score 20/24      Core Components/Risk Factors/Patient Goals at Admission:     Personal Goals and Risk Factors at Admission - 04/06/17 1158      Core Components/Risk Factors/Patient Goals on Admission    Weight Management Yes   Intervention Weight Management: Develop a combined nutrition and exercise program designed to reach desired caloric intake, while maintaining appropriate intake of nutrient and fiber, sodium and fats, and appropriate energy expenditure required for the weight goal.;Weight Management: Provide education and appropriate resources to help participant work on and attain dietary goals.;Weight Management/Obesity: Establish reasonable short term and long term weight goals.;Obesity: Provide education and appropriate resources to help participant work on and attain dietary goals.   Expected Outcomes Short Term: Continue to assess and modify interventions until short term weight is achieved;Long Term: Adherence to nutrition and physical activity/exercise program aimed toward attainment of established weight goal;Weight Maintenance: Understanding of the daily nutrition guidelines, which includes 25-35% calories from fat, 7% or less cal from saturated fats, less than 200mg  cholesterol, less than 1.5gm of sodium, & 5 or more servings of fruits and vegetables daily;Weight Loss: Understanding of general recommendations for a balanced  deficit meal plan, which promotes 1-2 lb weight loss per week and includes a negative energy balance of  [phone] kcal/d;Understanding recommendations for meals to include 15-35% energy as protein, 25-35% energy from fat, 35-60% energy from carbohydrates, less than 200mg  of dietary cholesterol, 20-35 gm of total fiber daily;Understanding of distribution of calorie intake throughout the day with the consumption of 4-5 meals/snacks   Diabetes Yes   Intervention Provide education about signs/symptoms and action to take for hypo/hyperglycemia.;Provide education about proper nutrition, including hydration, and aerobic/resistive exercise prescription along with prescribed medications to achieve blood glucose in normal ranges: Fasting glucose 65-99 mg/dL   Expected Outcomes Short Term: Participant verbalizes understanding of the signs/symptoms and immediate care of hyper/hypoglycemia, proper foot care and importance of medication, aerobic/resistive exercise and nutrition plan for blood glucose control.;Long Term: Attainment of HbA1C < 7%.   Hypertension Yes   Intervention Provide education on lifestyle modifcations including regular physical activity/exercise, weight management, moderate sodium restriction and increased consumption of fresh fruit, vegetables, and low fat dairy, alcohol moderation, and smoking cessation.;Monitor prescription use compliance.   Expected Outcomes Short Term: Continued assessment and intervention until BP is < 140/66mm HG in hypertensive participants. < 130/40mm HG in hypertensive participants with diabetes, heart failure or chronic kidney disease.;Long Term: Maintenance of blood pressure at goal levels.   Lipids Yes   Intervention Provide education and support for participant on nutrition & aerobic/resistive exercise along with prescribed medications to achieve LDL 70mg , HDL >40mg .   Expected Outcomes Short Term: Participant states understanding of desired cholesterol values and is  compliant with medications prescribed. Participant is following exercise prescription and nutrition guidelines.;Long Term: Cholesterol controlled with medications as prescribed, with individualized exercise RX and with personalized nutrition plan. Value goals: LDL < 70mg , HDL > 40 mg.      Core Components/Risk Factors/Patient Goals Review:    Core Components/Risk Factors/Patient Goals at Discharge (Final Review):    ITP Comments:     ITP Comments    Row Name 04/06/17 0809           ITP Comments Medical Director, Dr. Armanda Magic          Comments: Hussain attended orientation from 0800 to 0930 to review rules and guidelines for program. Completed 6 minute walk test, Intitial ITP, and exercise prescription.  VSS. Telemetry-Sinus Rhythm.  Asymptomatic.Gladstone Lighter, RN,BSN 04/06/2017 12:29 PM

## 2017-04-06 NOTE — Progress Notes (Signed)
Cardiac Rehab Medication Review by a Pharmacist  Does the patient  feel that his/her medications are working for him/her?  yes  Has the patient been experiencing any side effects to the medications prescribed?  yes  Does the patient measure his/her own blood pressure or blood glucose at home?  yes   Does the patient have any problems obtaining medications due to transportation or finances?   no  Understanding of regimen: excellent Understanding of indications: excellent Potential of compliance: excellent    Pharmacist comments: [id number] YOM here with his wife for cardiac rehab orientation. Only complaint of note is some dyspnea. Brillinta could have this side effect, but he is coming off of this medication in a month. He also doesn't believe the dyspnea is that bad and understands the use of caffeine to help. No other issues noted.  Gwyndolyn KaufmanKai Akshath Mccarey Bernette Redbird(Kenny), PharmD  Pharmacy Resident Pager: [phone] 04/06/2017 8:45 AM

## 2017-04-10 ENCOUNTER — Encounter (HOSPITAL_COMMUNITY): Payer: BLUE CROSS/BLUE SHIELD

## 2017-04-11 NOTE — Progress Notes (Signed)
Cardiac Individual Treatment Plan  Patient Details  Name: Tim Cook MRN: [id number] Date of Birth: 11/26/1960 Referring Provider:     CARDIAC REHAB PHASE II ORIENTATION from 04/06/2017 in [facility]ARD[facility]AC [facility]  Referring Provider  Viann Fish MD      Initial Encounter Date:    CARDIAC REHAB PHASE II ORIENTATION from 04/06/2017 in [facility] CARDIAC REHAB  Date  04/06/17  Referring Provider  Viann Fish MD      Visit Diagnosis: 05/18/16 Status post coronary artery stent placement  Patient's Home Medications on Admission:  Current Outpatient Prescriptions:  .  aspirin EC 81 MG tablet, Take 81 mg by mouth daily., Disp: , Rfl:  .  atorvastatin (LIPITOR) 40 MG tablet, , Disp: , Rfl:  .  gabapentin (NEURONTIN) 100 MG capsule, TAKE 2 CAPSULES BY MOUTH AT BEDTIME (Patient not taking: Reported on 04/06/2017), Disp: 180 capsule, Rfl: 1 .  loratadine (CLARITIN) 10 MG tablet, Take 10 mg by mouth daily., Disp: , Rfl: 5 .  losartan (COZAAR) 100 MG tablet, 100 mg. , Disp: , Rfl:  .  metFORMIN (GLUCOPHAGE) 500 MG tablet, Take 1,000 mg by mouth 2 (two) times daily with a meal. , Disp: , Rfl:  .  metoprolol tartrate (LOPRESSOR) 25 MG tablet, Take 25 mg by mouth 2 (two) times daily. , Disp: , Rfl:  .  Multiple Vitamin (MULTIVITAMIN) capsule, Take 1 capsule by mouth daily., Disp: , Rfl:  .  nitroGLYCERIN (NITROSTAT) 0.4 MG SL tablet, Place 1 tablet (0.4 mg total) under the tongue every 5 (five) minutes as needed for chest pain., Disp: 25 tablet, Rfl: 3 .  ticagrelor (BRILINTA) 90 MG TABS tablet, Take 1 tablet (90 mg total) by mouth 2 (two) times daily., Disp: 60 tablet, Rfl: 11 .  Vitamin D, Ergocalciferol, (DRISDOL) 50000 units CAPS capsule, TAKE ONE CAPSULE BY MOUTH EVERY 7 DAYS, Disp: 12 capsule, Rfl: 0  Past Medical History: Past Medical History:  Diagnosis Date  . Arthritis    "knees, lower back, hands" (05/18/2016)  . CAD (coronary artery  disease), native coronary artery    05/18/16  Cath severe 90% LAD prox, 99% mid, Circ dominant with 30% OM1, 50% OM2, 30% OM3, 60% distal circ and PL, severe stenosis nondominant RCA.  EF 55%  3.0x 20, 2.5 x 20 mm, 2.25 x 16 mm Promus premier sent to LAD Dr. Clifton James   . Chronic bronchitis (HCC)    "most years" (05/18/2016)  . Chronic lower back pain   . DDD (degenerative disc disease), lumbar   . Hyperlipidemia   . Hypertension   . Sleep apnea    "dx'd; never wore mask" (05/18/2016)  . Squamous carcinoma    cut off right upper arm, left shoulder; froze off nose and right side of face  . Type II diabetes mellitus (HCC)   . Unstable angina (HCC)   . Vomiting     Tobacco Use: History  Smoking Status  . Former Smoker  . Packs/day: 0.50  . Years: 12.00  . Types: Cigarettes  . Quit date: 12/15/1991  Smokeless Tobacco  . Former Neurosurgeon  . Types: Snuff    Comment: "quit snuff in ~ 1981"    Labs: Recent Review Flowsheet Data    There is no flowsheet data to display.      Capillary Blood Glucose: Lab Results  Component Value Date   GLUCAP 141 (H) 05/19/2016   GLUCAP 211 (H) 05/18/2016   GLUCAP 140 (H)  05/18/2016   GLUCAP 147 (H) 05/18/2016   GLUCAP 197 (H) 05/18/2016     Exercise Target Goals:    Exercise Program Goal: Individual exercise prescription set with THRR, safety & activity barriers. Participant demonstrates ability to understand and report RPE using BORG scale, to self-measure pulse accurately, and to acknowledge the importance of the exercise prescription.  Exercise Prescription Goal: Starting with aerobic activity 30 plus minutes a day, 3 days per week for initial exercise prescription. Provide home exercise prescription and guidelines that participant acknowledges understanding prior to discharge.  Activity Barriers & Risk Stratification:     Activity Barriers & Cardiac Risk Stratification - 04/06/17 1001      Activity Barriers & Cardiac Risk Stratification    Activity Barriers Other (comment)   Comments left knee and gout   Cardiac Risk Stratification Moderate      6 Minute Walk:     6 Minute Walk    Row Name 04/06/17 1032         6 Minute Walk   Phase Initial     Distance 1479 feet     Walk Time 6 minutes     # of Rest Breaks 0     MPH 2.8     METS 3.45     RPE 11     VO2 Peak 12.08     Symptoms No     Resting HR 86 bpm     Resting BP 114/60     Max Ex. HR 102 bpm     Max Ex. BP 124/64     2 Minute Post BP 122/70        Oxygen Initial Assessment:   Oxygen Re-Evaluation:   Oxygen Discharge (Final Oxygen Re-Evaluation):   Initial Exercise Prescription:     Initial Exercise Prescription - 04/06/17 1100      Date of Initial Exercise RX and Referring Provider   Date 04/06/17   Referring Provider Viann Fish MD     Recumbant Bike   Level 1.5   Minutes 10   METs 2.5     NuStep   Level 3   SPM 85   Minutes 10   METs 2.1     Track   Laps 10   Minutes 10   METs 2.74     Prescription Details   Frequency (times per week) 2   Duration Progress to 45 minutes of aerobic exercise without signs/symptoms of physical distress     Intensity   THRR 40-80% of Max Heartrate 66-133   Ratings of Perceived Exertion 11-13   Perceived Dyspnea 0-4     Progression   Progression Continue to progress workloads to maintain intensity without signs/symptoms of physical distress.     Resistance Training   Training Prescription Yes   Weight 3   Reps 10-15      Perform Capillary Blood Glucose checks as needed.  Exercise Prescription Changes:   Exercise Comments:   Exercise Goals and Review:     Exercise Goals    Row Name 04/06/17 1002             Exercise Goals   Increase Physical Activity Yes       Intervention Provide advice, education, support and counseling about physical activity/exercise needs.;Develop an individualized exercise prescription for aerobic and resistive training based on  initial evaluation findings, risk stratification, comorbidities and participant's personal goals.       Expected Outcomes Achievement of increased cardiorespiratory fitness and enhanced flexibility, muscular  endurance and strength shown through measurements of functional capacity and personal statement of participant.       Increase Strength and Stamina Yes       Intervention Provide advice, education, support and counseling about physical activity/exercise needs.;Develop an individualized exercise prescription for aerobic and resistive training based on initial evaluation findings, risk stratification, comorbidities and participant's personal goals.       Expected Outcomes Achievement of increased cardiorespiratory fitness and enhanced flexibility, muscular endurance and strength shown through measurements of functional capacity and personal statement of participant.          Exercise Goals Re-Evaluation :    Discharge Exercise Prescription (Final Exercise Prescription Changes):   Nutrition:  Target Goals: Understanding of nutrition guidelines, daily intake of sodium 1500mg , cholesterol 200mg , calories 30% from fat and 7% or less from saturated fats, daily to have 5 or more servings of fruits and vegetables.  Biometrics:     Pre Biometrics - 04/06/17 1218      Pre Biometrics   Height 5' 8.25" (1.734 m)   Weight 250 lb 10.6 oz (113.7 kg)   Waist Circumference 44.25 inches   Hip Circumference 45.75 inches   Waist to Hip Ratio 0.97 %   BMI (Calculated) 37.9   Triceps Skinfold 28 mm   % Body Fat 35.4 %   Grip Strength 42 kg   Flexibility 9 in   Single Leg Stand 30 seconds       Nutrition Therapy Plan and Nutrition Goals:     Nutrition Therapy & Goals - 04/06/17 1250      Nutrition Therapy   Diet Carb Modified, Therapeutic Lifestyle Changes     Personal Nutrition Goals   Nutrition Goal Pt to identify and limit food sources of saturated fat, trans fat, and sodium by  learning what foods make up a heart healthy diet.   Personal Goal #2 Pt to identify food quantities necessary to achieve weight loss of 6-24 lb (2.7-10.9 kg) at graduation from cardiac rehab. Goal wt of 230 lb desired.   Personal Goal #3 Pt to learn to use different seasonings and herbs in cooking     Intervention Plan   Intervention Prescribe, educate and counsel regarding individualized specific dietary modifications aiming towards targeted core components such as weight, hypertension, lipid management, diabetes, heart failure and other comorbidities.   Expected Outcomes Short Term Goal: Understand basic principles of dietary content, such as calories, fat, sodium, cholesterol and nutrients.;Long Term Goal: Adherence to prescribed nutrition plan.      Nutrition Discharge: Nutrition Scores:     Nutrition Assessments - 04/06/17 1250      MEDFICTS Scores   Pre Score 67      Nutrition Goals Re-Evaluation:   Nutrition Goals Re-Evaluation:   Nutrition Goals Discharge (Final Nutrition Goals Re-Evaluation):   Psychosocial: Target Goals: Acknowledge presence or absence of significant depression and/or stress, maximize coping skills, provide positive support system. Participant is able to verbalize types and ability to use techniques and skills needed for reducing stress and depression.  Initial Review & Psychosocial Screening:     Initial Psych Review & Screening - 04/06/17 1202      Initial Review   Current issues with None Identified     Family Dynamics   Good Support System? Yes     Barriers   Psychosocial barriers to participate in program There are no identifiable barriers or psychosocial needs.     Screening Interventions   Interventions Encouraged to exercise  Quality of Life Scores:     Quality of Life - 04/06/17 1156      Quality of Life Scores   Health/Function Pre 27.8 %   Socioeconomic Pre 25.07 %   Psych/Spiritual Pre 24.64 %   Family Pre 28.8 %    GLOBAL Pre 24.53 %      PHQ-9: Recent Review Flowsheet Data    There is no flowsheet data to display.     Interpretation of Total Score  Total Score Depression Severity:  1-4 = Minimal depression, 5-9 = Mild depression, 10-14 = Moderate depression, 15-19 = Moderately severe depression, 20-27 = Severe depression   Psychosocial Evaluation and Intervention:     Psychosocial Evaluation - 04/11/17 1546      Psychosocial Evaluation & Interventions   Interventions Encouraged to exercise with the program and follow exercise prescription   Expected Outcomes pt will exhibit positive outlook with good coping skills.       Psychosocial Re-Evaluation:   Psychosocial Discharge (Final Psychosocial Re-Evaluation):   Vocational Rehabilitation: Provide vocational rehab assistance to qualifying candidates.   Vocational Rehab Evaluation & Intervention:     Vocational Rehab - 04/06/17 1201      Initial Vocational Rehab Evaluation & Intervention   Assessment shows need for Vocational Rehabilitation No      Education: Education Goals: Education classes will be provided on a weekly basis, covering required topics. Participant will state understanding/return demonstration of topics presented.  Learning Barriers/Preferences:     Learning Barriers/Preferences - 04/06/17 [id number]      Learning Barriers/Preferences   Learning Barriers None   Learning Preferences Skilled Demonstration;Pictoral;Written Material      Education Topics: Count Your Pulse:  -Group instruction provided by verbal instruction, demonstration, patient participation and written materials to support subject.  Instructors address importance of being able to find your pulse and how to count your pulse when at home without a heart monitor.  Patients get hands on experience counting their pulse with staff help and individually.   Heart Attack, Angina, and Risk Factor Modification:  -Group instruction provided by  verbal instruction, video, and written materials to support subject.  Instructors address signs and symptoms of angina and heart attacks.    Also discuss risk factors for heart disease and how to make changes to improve heart health risk factors.   Functional Fitness:  -Group instruction provided by verbal instruction, demonstration, patient participation, and written materials to support subject.  Instructors address safety measures for doing things around the house.  Discuss how to get up and down off the floor, how to pick things up properly, how to safely get out of a chair without assistance, and balance training.   Meditation and Mindfulness:  -Group instruction provided by verbal instruction, patient participation, and written materials to support subject.  Instructor addresses importance of mindfulness and meditation practice to help reduce stress and improve awareness.  Instructor also leads participants through a meditation exercise.    Stretching for Flexibility and Mobility:  -Group instruction provided by verbal instruction, patient participation, and written materials to support subject.  Instructors lead participants through series of stretches that are designed to increase flexibility thus improving mobility.  These stretches are additional exercise for major muscle groups that are typically performed during regular warm up and cool down.   Hands Only CPR:  -Group verbal, video, and participation provides a basic overview of AHA guidelines for community CPR. Role-play of emergencies allow participants the opportunity to practice calling for help  and chest compression technique with discussion of AED use.   Hypertension: -Group verbal and written instruction that provides a basic overview of hypertension including the most recent diagnostic guidelines, risk factor reduction with self-care instructions and medication management.    Nutrition I class: Heart Healthy Eating:   -Group instruction provided by PowerPoint slides, verbal discussion, and written materials to support subject matter. The instructor gives an explanation and review of the Therapeutic Lifestyle Changes diet recommendations, which includes a discussion on lipid goals, dietary fat, sodium, fiber, plant stanol/sterol esters, sugar, and the components of a well-balanced, healthy diet.   Nutrition II class: Lifestyle Skills:  -Group instruction provided by PowerPoint slides, verbal discussion, and written materials to support subject matter. The instructor gives an explanation and review of label reading, grocery shopping for heart health, heart healthy recipe modifications, and ways to make healthier choices when eating out.   Diabetes Question & Answer:  -Group instruction provided by PowerPoint slides, verbal discussion, and written materials to support subject matter. The instructor gives an explanation and review of diabetes co-morbidities, pre- and post-prandial blood glucose goals, pre-exercise blood glucose goals, signs, symptoms, and treatment of hypoglycemia and hyperglycemia, and foot care basics.   Diabetes Blitz:  -Group instruction provided by PowerPoint slides, verbal discussion, and written materials to support subject matter. The instructor gives an explanation and review of the physiology behind type 1 and type 2 diabetes, diabetes medications and rational behind using different medications, pre- and post-prandial blood glucose recommendations and Hemoglobin A1c goals, diabetes diet, and exercise including blood glucose guidelines for exercising safely.    Portion Distortion:  -Group instruction provided by PowerPoint slides, verbal discussion, written materials, and food models to support subject matter. The instructor gives an explanation of serving size versus portion size, changes in portions sizes over the last 20 years, and what consists of a serving from each food  group.   Stress Management:  -Group instruction provided by verbal instruction, video, and written materials to support subject matter.  Instructors review role of stress in heart disease and how to cope with stress positively.     Exercising on Your Own:  -Group instruction provided by verbal instruction, power point, and written materials to support subject.  Instructors discuss benefits of exercise, components of exercise, frequency and intensity of exercise, and end points for exercise.  Also discuss use of nitroglycerin and activating EMS.  Review options of places to exercise outside of rehab.  Review guidelines for sex with heart disease.   Cardiac Drugs I:  -Group instruction provided by verbal instruction and written materials to support subject.  Instructor reviews cardiac drug classes: antiplatelets, anticoagulants, beta blockers, and statins.  Instructor discusses reasons, side effects, and lifestyle considerations for each drug class.   Cardiac Drugs II:  -Group instruction provided by verbal instruction and written materials to support subject.  Instructor reviews cardiac drug classes: angiotensin converting enzyme inhibitors (ACE-I), angiotensin II receptor blockers (ARBs), nitrates, and calcium channel blockers.  Instructor discusses reasons, side effects, and lifestyle considerations for each drug class.   Anatomy and Physiology of the Circulatory System:  Group verbal and written instruction and models provide basic cardiac anatomy and physiology, with the coronary electrical and arterial systems. Review of: AMI, Angina, Valve disease, Heart Failure, Peripheral Artery Disease, Cardiac Arrhythmia, Pacemakers, and the ICD.   Other Education:  -Group or individual verbal, written, or video instructions that support the educational goals of the cardiac rehab program.   Knowledge Questionnaire  Score:     Knowledge Questionnaire Score - 04/06/17 1200      Knowledge  Questionnaire Score   Pre Score 20/24      Core Components/Risk Factors/Patient Goals at Admission:     Personal Goals and Risk Factors at Admission - 04/06/17 1158      Core Components/Risk Factors/Patient Goals on Admission    Weight Management Yes   Intervention Weight Management: Develop a combined nutrition and exercise program designed to reach desired caloric intake, while maintaining appropriate intake of nutrient and fiber, sodium and fats, and appropriate energy expenditure required for the weight goal.;Weight Management: Provide education and appropriate resources to help participant work on and attain dietary goals.;Weight Management/Obesity: Establish reasonable short term and long term weight goals.;Obesity: Provide education and appropriate resources to help participant work on and attain dietary goals.   Expected Outcomes Short Term: Continue to assess and modify interventions until short term weight is achieved;Long Term: Adherence to nutrition and physical activity/exercise program aimed toward attainment of established weight goal;Weight Maintenance: Understanding of the daily nutrition guidelines, which includes 25-35% calories from fat, 7% or less cal from saturated fats, less than 200mg  cholesterol, less than 1.5gm of sodium, & 5 or more servings of fruits and vegetables daily;Weight Loss: Understanding of general recommendations for a balanced deficit meal plan, which promotes 1-2 lb weight loss per week and includes a negative energy balance of [phone] kcal/d;Understanding recommendations for meals to include 15-35% energy as protein, 25-35% energy from fat, 35-60% energy from carbohydrates, less than 200mg  of dietary cholesterol, 20-35 gm of total fiber daily;Understanding of distribution of calorie intake throughout the day with the consumption of 4-5 meals/snacks   Diabetes Yes   Intervention Provide education about signs/symptoms and action to take for  hypo/hyperglycemia.;Provide education about proper nutrition, including hydration, and aerobic/resistive exercise prescription along with prescribed medications to achieve blood glucose in normal ranges: Fasting glucose 65-99 mg/dL   Expected Outcomes Short Term: Participant verbalizes understanding of the signs/symptoms and immediate care of hyper/hypoglycemia, proper foot care and importance of medication, aerobic/resistive exercise and nutrition plan for blood glucose control.;Long Term: Attainment of HbA1C < 7%.   Hypertension Yes   Intervention Provide education on lifestyle modifcations including regular physical activity/exercise, weight management, moderate sodium restriction and increased consumption of fresh fruit, vegetables, and low fat dairy, alcohol moderation, and smoking cessation.;Monitor prescription use compliance.   Expected Outcomes Short Term: Continued assessment and intervention until BP is < 140/29mm HG in hypertensive participants. < 130/73mm HG in hypertensive participants with diabetes, heart failure or chronic kidney disease.;Long Term: Maintenance of blood pressure at goal levels.   Lipids Yes   Intervention Provide education and support for participant on nutrition & aerobic/resistive exercise along with prescribed medications to achieve LDL 70mg , HDL >40mg .   Expected Outcomes Short Term: Participant states understanding of desired cholesterol values and is compliant with medications prescribed. Participant is following exercise prescription and nutrition guidelines.;Long Term: Cholesterol controlled with medications as prescribed, with individualized exercise RX and with personalized nutrition plan. Value goals: LDL < 70mg , HDL > 40 mg.      Core Components/Risk Factors/Patient Goals Review:      Goals and Risk Factor Review    Row Name 04/11/17 1545             Core Components/Risk Factors/Patient Goals Review   Personal Goals Review Weight  Management/Obesity;Diabetes;Hypertension;Lipids       Expected Outcomes pt will participate in CR exercise, nutrition and lifestyle education  classes to decrease overall CAD RF.           Core Components/Risk Factors/Patient Goals at Discharge (Final Review):      Goals and Risk Factor Review - 04/11/17 1545      Core Components/Risk Factors/Patient Goals Review   Personal Goals Review Weight Management/Obesity;Diabetes;Hypertension;Lipids   Expected Outcomes pt will participate in CR exercise, nutrition and lifestyle education classes to decrease overall CAD RF.       ITP Comments:     ITP Comments    Row Name 04/06/17 0809 04/11/17 1544         ITP Comments Medical Director, Dr. Armanda Magic Medical Director, Dr. Armanda Magic         Comments: Recommend continued exercise and life style modification education including  stress management and relaxation techniques to decrease cardiac risk profile.

## 2017-04-12 ENCOUNTER — Encounter (HOSPITAL_COMMUNITY)
Admission: RE | Admit: 2017-04-12 | Discharge: 2017-04-12 | Disposition: A | Discharge status: Home / Self Care | Payer: BLUE CROSS/BLUE SHIELD | Source: Ambulatory Visit | Attending: Cardiology | Admitting: Cardiology

## 2017-04-12 ENCOUNTER — Encounter (HOSPITAL_COMMUNITY): Payer: Self-pay

## 2017-04-12 DIAGNOSIS — I10 Essential (primary) hypertension: Secondary | ICD-10-CM | POA: Diagnosis not present

## 2017-04-12 DIAGNOSIS — M1990 Unspecified osteoarthritis, unspecified site: Secondary | ICD-10-CM | POA: Diagnosis not present

## 2017-04-12 DIAGNOSIS — Z7902 Long term (current) use of antithrombotics/antiplatelets: Secondary | ICD-10-CM | POA: Diagnosis not present

## 2017-04-12 DIAGNOSIS — E119 Type 2 diabetes mellitus without complications: Secondary | ICD-10-CM | POA: Diagnosis not present

## 2017-04-12 DIAGNOSIS — Z7984 Long term (current) use of oral hypoglycemic drugs: Secondary | ICD-10-CM | POA: Diagnosis not present

## 2017-04-12 DIAGNOSIS — Z79899 Other long term (current) drug therapy: Secondary | ICD-10-CM | POA: Diagnosis not present

## 2017-04-12 DIAGNOSIS — Z955 Presence of coronary angioplasty implant and graft: Secondary | ICD-10-CM

## 2017-04-12 DIAGNOSIS — I2510 Atherosclerotic heart disease of native coronary artery without angina pectoris: Secondary | ICD-10-CM | POA: Diagnosis not present

## 2017-04-12 DIAGNOSIS — Z87891 Personal history of nicotine dependence: Secondary | ICD-10-CM | POA: Diagnosis not present

## 2017-04-12 DIAGNOSIS — M5136 Other intervertebral disc degeneration, lumbar region: Secondary | ICD-10-CM | POA: Diagnosis not present

## 2017-04-12 DIAGNOSIS — Z85828 Personal history of other malignant neoplasm of skin: Secondary | ICD-10-CM | POA: Diagnosis not present

## 2017-04-12 DIAGNOSIS — Z7982 Long term (current) use of aspirin: Secondary | ICD-10-CM | POA: Diagnosis not present

## 2017-04-12 DIAGNOSIS — G8929 Other chronic pain: Secondary | ICD-10-CM | POA: Diagnosis not present

## 2017-04-12 DIAGNOSIS — E785 Hyperlipidemia, unspecified: Secondary | ICD-10-CM | POA: Diagnosis not present

## 2017-04-12 DIAGNOSIS — G4730 Sleep apnea, unspecified: Secondary | ICD-10-CM | POA: Diagnosis not present

## 2017-04-12 LAB — GLUCOSE, CAPILLARY
GLUCOSE-CAPILLARY: 177 mg/dL — AB (ref 65–99)
GLUCOSE-CAPILLARY: 127 mg/dL — AB (ref 65–99)

## 2017-04-12 NOTE — Progress Notes (Signed)
Daily Session Note  Patient Details  Name: Tim Cook MRN: [id number] Date of Birth: Jan 30, 1961 Referring Provider:     CARDIAC REHAB PHASE II ORIENTATION from 04/06/2017 in [place]  Referring Provider  Tollie Eth MD      Encounter Date: 04/12/2017  Check In:     Session Check In - 04/12/17 0743      Check-In   Location [facility]   Staff Present Maurice Small, RN, BSN;Joann Rion, RN, Deland Pretty, MS, ACSM CEP, Exercise Physiologist   Supervising physician immediately available to respond to emergencies [facility] immediately available   Physician(s) Dr. Burnis Medin   Medication changes reported     No   Fall or balance concerns reported    No   Tobacco Cessation No Change   Warm-up and Cool-down Performed as group-led instruction   Resistance Training Performed No   VAD Patient? No     Pain Assessment   Currently in Pain? No/denies   Multiple Pain Sites No      Capillary Blood Glucose: No results found for this or any previous visit (from the past 24 hour(s)).    History  Smoking Status  . Former Smoker  . Packs/day: 0.50  . Years: 12.00  . Types: Cigarettes  . Quit date: 12/15/1991  Smokeless Tobacco  . Former Systems developer  . Types: Snuff    Comment: "quit snuff in ~ 1981"    Goals Met:  Exercise tolerated well  Goals Unmet:  Not Applicable  Comments: Pt started cardiac rehab today.  Pt tolerated light exercise without difficulty. VSS, telemetry-sinus rhythm,  asymptomatic.  Medication list reconciled. Pt denies barriers to medicaiton compliance.  PSYCHOSOCIAL ASSESSMENT:  PHQ-0.Marland Kitchen Pt exhibits positive coping skills, hopeful outlook with supportive family. No psychosocial needs identified at this time, no psychosocial interventions necessary.    Pt enjoys spending time with his family doing outdoor activities. 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 Pt is still  transitioning from this move.  Pt goals for cardiac rehab are to decrease dyspnea and lose weight. Pt encouraged to take advantage of all aspects of cardiac rehab program to increase ability to meet these goals.     Pt oriented to exercise equipment and routine.    Understanding verbalized.   Dr. Fransico Him is Medical Director for Cardiac Rehab at [facility].

## 2017-04-14 ENCOUNTER — Encounter (HOSPITAL_COMMUNITY)
Admission: RE | Admit: 2017-04-14 | Discharge: 2017-04-14 | Disposition: A | Discharge status: Home / Self Care | Payer: BLUE CROSS/BLUE SHIELD | Source: Ambulatory Visit | Attending: Cardiology | Admitting: Cardiology

## 2017-04-14 DIAGNOSIS — E119 Type 2 diabetes mellitus without complications: Secondary | ICD-10-CM | POA: Diagnosis not present

## 2017-04-14 DIAGNOSIS — I2510 Atherosclerotic heart disease of native coronary artery without angina pectoris: Secondary | ICD-10-CM | POA: Diagnosis not present

## 2017-04-14 DIAGNOSIS — M1990 Unspecified osteoarthritis, unspecified site: Secondary | ICD-10-CM | POA: Diagnosis not present

## 2017-04-14 DIAGNOSIS — Z7982 Long term (current) use of aspirin: Secondary | ICD-10-CM | POA: Diagnosis not present

## 2017-04-14 DIAGNOSIS — Z955 Presence of coronary angioplasty implant and graft: Secondary | ICD-10-CM

## 2017-04-14 DIAGNOSIS — G8929 Other chronic pain: Secondary | ICD-10-CM | POA: Diagnosis not present

## 2017-04-14 DIAGNOSIS — Z87891 Personal history of nicotine dependence: Secondary | ICD-10-CM | POA: Diagnosis not present

## 2017-04-14 DIAGNOSIS — Z79899 Other long term (current) drug therapy: Secondary | ICD-10-CM | POA: Diagnosis not present

## 2017-04-14 DIAGNOSIS — Z85828 Personal history of other malignant neoplasm of skin: Secondary | ICD-10-CM | POA: Diagnosis not present

## 2017-04-14 DIAGNOSIS — Z7984 Long term (current) use of oral hypoglycemic drugs: Secondary | ICD-10-CM | POA: Diagnosis not present

## 2017-04-14 DIAGNOSIS — Z7902 Long term (current) use of antithrombotics/antiplatelets: Secondary | ICD-10-CM | POA: Diagnosis not present

## 2017-04-14 DIAGNOSIS — M5136 Other intervertebral disc degeneration, lumbar region: Secondary | ICD-10-CM | POA: Diagnosis not present

## 2017-04-14 DIAGNOSIS — E785 Hyperlipidemia, unspecified: Secondary | ICD-10-CM | POA: Diagnosis not present

## 2017-04-14 DIAGNOSIS — I10 Essential (primary) hypertension: Secondary | ICD-10-CM | POA: Diagnosis not present

## 2017-04-14 DIAGNOSIS — G4730 Sleep apnea, unspecified: Secondary | ICD-10-CM | POA: Diagnosis not present

## 2017-04-14 LAB — GLUCOSE, CAPILLARY
GLUCOSE-CAPILLARY: 130 mg/dL — AB (ref 65–99)
Glucose-Capillary: 153 mg/dL — ABNORMAL HIGH (ref 65–99)

## 2017-04-17 ENCOUNTER — Encounter (HOSPITAL_COMMUNITY)
Admission: RE | Admit: 2017-04-17 | Discharge: 2017-04-17 | Disposition: A | Discharge status: Home / Self Care | Payer: BLUE CROSS/BLUE SHIELD | Source: Ambulatory Visit | Attending: Cardiology | Admitting: Cardiology

## 2017-04-17 DIAGNOSIS — Z7982 Long term (current) use of aspirin: Secondary | ICD-10-CM | POA: Diagnosis not present

## 2017-04-17 DIAGNOSIS — Z7984 Long term (current) use of oral hypoglycemic drugs: Secondary | ICD-10-CM | POA: Diagnosis not present

## 2017-04-17 DIAGNOSIS — I10 Essential (primary) hypertension: Secondary | ICD-10-CM | POA: Diagnosis not present

## 2017-04-17 DIAGNOSIS — G8929 Other chronic pain: Secondary | ICD-10-CM | POA: Diagnosis not present

## 2017-04-17 DIAGNOSIS — Z955 Presence of coronary angioplasty implant and graft: Secondary | ICD-10-CM

## 2017-04-17 DIAGNOSIS — Z79899 Other long term (current) drug therapy: Secondary | ICD-10-CM | POA: Diagnosis not present

## 2017-04-17 DIAGNOSIS — Z7902 Long term (current) use of antithrombotics/antiplatelets: Secondary | ICD-10-CM | POA: Diagnosis not present

## 2017-04-17 DIAGNOSIS — Z85828 Personal history of other malignant neoplasm of skin: Secondary | ICD-10-CM | POA: Diagnosis not present

## 2017-04-17 DIAGNOSIS — M1990 Unspecified osteoarthritis, unspecified site: Secondary | ICD-10-CM | POA: Diagnosis not present

## 2017-04-17 DIAGNOSIS — M5136 Other intervertebral disc degeneration, lumbar region: Secondary | ICD-10-CM | POA: Diagnosis not present

## 2017-04-17 DIAGNOSIS — E785 Hyperlipidemia, unspecified: Secondary | ICD-10-CM | POA: Diagnosis not present

## 2017-04-17 DIAGNOSIS — G4730 Sleep apnea, unspecified: Secondary | ICD-10-CM | POA: Diagnosis not present

## 2017-04-17 DIAGNOSIS — Z87891 Personal history of nicotine dependence: Secondary | ICD-10-CM | POA: Diagnosis not present

## 2017-04-17 DIAGNOSIS — E119 Type 2 diabetes mellitus without complications: Secondary | ICD-10-CM | POA: Diagnosis not present

## 2017-04-17 DIAGNOSIS — I2510 Atherosclerotic heart disease of native coronary artery without angina pectoris: Secondary | ICD-10-CM | POA: Diagnosis not present

## 2017-04-17 LAB — GLUCOSE, CAPILLARY: Glucose-Capillary: 154 mg/dL — ABNORMAL HIGH (ref 65–99)

## 2017-04-19 ENCOUNTER — Other Ambulatory Visit: Payer: Self-pay | Admitting: Physician Assistant

## 2017-04-19 ENCOUNTER — Encounter (HOSPITAL_COMMUNITY)
Admission: RE | Admit: 2017-04-19 | Discharge: 2017-04-19 | Disposition: A | Discharge status: Home / Self Care | Payer: BLUE CROSS/BLUE SHIELD | Source: Ambulatory Visit | Attending: Cardiology | Admitting: Cardiology

## 2017-04-19 DIAGNOSIS — Z7982 Long term (current) use of aspirin: Secondary | ICD-10-CM | POA: Diagnosis not present

## 2017-04-19 DIAGNOSIS — M1990 Unspecified osteoarthritis, unspecified site: Secondary | ICD-10-CM | POA: Diagnosis not present

## 2017-04-19 DIAGNOSIS — E119 Type 2 diabetes mellitus without complications: Secondary | ICD-10-CM | POA: Diagnosis not present

## 2017-04-19 DIAGNOSIS — Z87891 Personal history of nicotine dependence: Secondary | ICD-10-CM | POA: Diagnosis not present

## 2017-04-19 DIAGNOSIS — Z7984 Long term (current) use of oral hypoglycemic drugs: Secondary | ICD-10-CM | POA: Diagnosis not present

## 2017-04-19 DIAGNOSIS — I10 Essential (primary) hypertension: Secondary | ICD-10-CM | POA: Diagnosis not present

## 2017-04-19 DIAGNOSIS — I2510 Atherosclerotic heart disease of native coronary artery without angina pectoris: Secondary | ICD-10-CM | POA: Diagnosis not present

## 2017-04-19 DIAGNOSIS — G4730 Sleep apnea, unspecified: Secondary | ICD-10-CM | POA: Diagnosis not present

## 2017-04-19 DIAGNOSIS — Z79899 Other long term (current) drug therapy: Secondary | ICD-10-CM | POA: Diagnosis not present

## 2017-04-19 DIAGNOSIS — Z85828 Personal history of other malignant neoplasm of skin: Secondary | ICD-10-CM | POA: Diagnosis not present

## 2017-04-19 DIAGNOSIS — Z7902 Long term (current) use of antithrombotics/antiplatelets: Secondary | ICD-10-CM | POA: Diagnosis not present

## 2017-04-19 DIAGNOSIS — Z955 Presence of coronary angioplasty implant and graft: Secondary | ICD-10-CM | POA: Diagnosis not present

## 2017-04-19 DIAGNOSIS — G8929 Other chronic pain: Secondary | ICD-10-CM | POA: Diagnosis not present

## 2017-04-19 DIAGNOSIS — M5136 Other intervertebral disc degeneration, lumbar region: Secondary | ICD-10-CM | POA: Diagnosis not present

## 2017-04-19 DIAGNOSIS — E785 Hyperlipidemia, unspecified: Secondary | ICD-10-CM | POA: Diagnosis not present

## 2017-04-19 LAB — GLUCOSE, CAPILLARY: Glucose-Capillary: 145 mg/dL — ABNORMAL HIGH (ref 65–99)

## 2017-04-19 NOTE — Telephone Encounter (Signed)
Per chart review, [facility] 05/19/16 telephone note states patient is followed by Tim FishSpencer Tilley, MD  Called pharmacy to inform them patient does not see a provider with our group

## 2017-04-19 NOTE — Telephone Encounter (Signed)
°*  STAT* If patient is at the pharmacy, call can be transferred to refill team.   1. Which medications need to be refilled? (please list name of each medication and dose if known) Nitrostat 0.4  2. Which pharmacy/location (including street and city if local pharmacy) is medication to be sent to?[facility] [address]  3. Do they need a 30 day or 90 day supply?   [facility] [place] [facility]

## 2017-04-21 ENCOUNTER — Encounter (HOSPITAL_COMMUNITY)
Admission: RE | Admit: 2017-04-21 | Discharge: 2017-04-21 | Disposition: A | Discharge status: Home / Self Care | Payer: BLUE CROSS/BLUE SHIELD | Source: Ambulatory Visit | Attending: Cardiology | Admitting: Cardiology

## 2017-04-21 DIAGNOSIS — G4730 Sleep apnea, unspecified: Secondary | ICD-10-CM | POA: Diagnosis not present

## 2017-04-21 DIAGNOSIS — M5136 Other intervertebral disc degeneration, lumbar region: Secondary | ICD-10-CM | POA: Diagnosis not present

## 2017-04-21 DIAGNOSIS — M1990 Unspecified osteoarthritis, unspecified site: Secondary | ICD-10-CM | POA: Diagnosis not present

## 2017-04-21 DIAGNOSIS — E785 Hyperlipidemia, unspecified: Secondary | ICD-10-CM | POA: Diagnosis not present

## 2017-04-21 DIAGNOSIS — I10 Essential (primary) hypertension: Secondary | ICD-10-CM | POA: Diagnosis not present

## 2017-04-21 DIAGNOSIS — Z85828 Personal history of other malignant neoplasm of skin: Secondary | ICD-10-CM | POA: Diagnosis not present

## 2017-04-21 DIAGNOSIS — G8929 Other chronic pain: Secondary | ICD-10-CM | POA: Diagnosis not present

## 2017-04-21 DIAGNOSIS — Z955 Presence of coronary angioplasty implant and graft: Secondary | ICD-10-CM

## 2017-04-21 DIAGNOSIS — Z7902 Long term (current) use of antithrombotics/antiplatelets: Secondary | ICD-10-CM | POA: Diagnosis not present

## 2017-04-21 DIAGNOSIS — Z7982 Long term (current) use of aspirin: Secondary | ICD-10-CM | POA: Diagnosis not present

## 2017-04-21 DIAGNOSIS — Z87891 Personal history of nicotine dependence: Secondary | ICD-10-CM | POA: Diagnosis not present

## 2017-04-21 DIAGNOSIS — Z79899 Other long term (current) drug therapy: Secondary | ICD-10-CM | POA: Diagnosis not present

## 2017-04-21 DIAGNOSIS — E119 Type 2 diabetes mellitus without complications: Secondary | ICD-10-CM | POA: Diagnosis not present

## 2017-04-21 DIAGNOSIS — I2510 Atherosclerotic heart disease of native coronary artery without angina pectoris: Secondary | ICD-10-CM | POA: Diagnosis not present

## 2017-04-21 DIAGNOSIS — Z7984 Long term (current) use of oral hypoglycemic drugs: Secondary | ICD-10-CM | POA: Diagnosis not present

## 2017-04-21 LAB — GLUCOSE, CAPILLARY: GLUCOSE-CAPILLARY: 155 mg/dL — AB (ref 65–99)

## 2017-04-24 ENCOUNTER — Encounter (HOSPITAL_COMMUNITY): Payer: BLUE CROSS/BLUE SHIELD

## 2017-04-26 ENCOUNTER — Encounter (HOSPITAL_COMMUNITY): Payer: BLUE CROSS/BLUE SHIELD

## 2017-04-28 ENCOUNTER — Encounter (HOSPITAL_COMMUNITY): Payer: BLUE CROSS/BLUE SHIELD

## 2017-05-01 ENCOUNTER — Other Ambulatory Visit: Payer: Self-pay | Admitting: Family Medicine

## 2017-05-01 ENCOUNTER — Encounter (HOSPITAL_COMMUNITY): Payer: BLUE CROSS/BLUE SHIELD

## 2017-05-01 DIAGNOSIS — Z85828 Personal history of other malignant neoplasm of skin: Secondary | ICD-10-CM | POA: Diagnosis not present

## 2017-05-01 DIAGNOSIS — D225 Melanocytic nevi of trunk: Secondary | ICD-10-CM | POA: Diagnosis not present

## 2017-05-01 DIAGNOSIS — L821 Other seborrheic keratosis: Secondary | ICD-10-CM | POA: Diagnosis not present

## 2017-05-01 DIAGNOSIS — L570 Actinic keratosis: Secondary | ICD-10-CM | POA: Diagnosis not present

## 2017-05-03 ENCOUNTER — Encounter (HOSPITAL_COMMUNITY)
Admission: RE | Admit: 2017-05-03 | Discharge: 2017-05-03 | Disposition: A | Discharge status: Home / Self Care | Payer: BLUE CROSS/BLUE SHIELD | Source: Ambulatory Visit | Attending: Cardiology | Admitting: Cardiology

## 2017-05-03 DIAGNOSIS — I10 Essential (primary) hypertension: Secondary | ICD-10-CM | POA: Insufficient documentation

## 2017-05-03 DIAGNOSIS — Z7982 Long term (current) use of aspirin: Secondary | ICD-10-CM | POA: Insufficient documentation

## 2017-05-03 DIAGNOSIS — E785 Hyperlipidemia, unspecified: Secondary | ICD-10-CM | POA: Insufficient documentation

## 2017-05-03 DIAGNOSIS — Z85828 Personal history of other malignant neoplasm of skin: Secondary | ICD-10-CM | POA: Insufficient documentation

## 2017-05-03 DIAGNOSIS — G4730 Sleep apnea, unspecified: Secondary | ICD-10-CM | POA: Insufficient documentation

## 2017-05-03 DIAGNOSIS — I2510 Atherosclerotic heart disease of native coronary artery without angina pectoris: Secondary | ICD-10-CM | POA: Insufficient documentation

## 2017-05-03 DIAGNOSIS — M5136 Other intervertebral disc degeneration, lumbar region: Secondary | ICD-10-CM | POA: Insufficient documentation

## 2017-05-03 DIAGNOSIS — Z7902 Long term (current) use of antithrombotics/antiplatelets: Secondary | ICD-10-CM | POA: Insufficient documentation

## 2017-05-03 DIAGNOSIS — Z79899 Other long term (current) drug therapy: Secondary | ICD-10-CM | POA: Insufficient documentation

## 2017-05-03 DIAGNOSIS — G8929 Other chronic pain: Secondary | ICD-10-CM | POA: Insufficient documentation

## 2017-05-03 DIAGNOSIS — M1990 Unspecified osteoarthritis, unspecified site: Secondary | ICD-10-CM | POA: Insufficient documentation

## 2017-05-03 DIAGNOSIS — E119 Type 2 diabetes mellitus without complications: Secondary | ICD-10-CM | POA: Insufficient documentation

## 2017-05-03 DIAGNOSIS — Z955 Presence of coronary angioplasty implant and graft: Secondary | ICD-10-CM | POA: Insufficient documentation

## 2017-05-03 DIAGNOSIS — Z87891 Personal history of nicotine dependence: Secondary | ICD-10-CM | POA: Insufficient documentation

## 2017-05-03 DIAGNOSIS — Z7984 Long term (current) use of oral hypoglycemic drugs: Secondary | ICD-10-CM | POA: Insufficient documentation

## 2017-05-05 ENCOUNTER — Encounter (HOSPITAL_COMMUNITY): Payer: BLUE CROSS/BLUE SHIELD

## 2017-05-06 NOTE — Progress Notes (Deleted)
[facility] [address] [place], [place] [address] Phone: [phone] Subjective:    I'm seeing this patient by the request  of:  Tim James, MD     CC: Left knee pain f/u Low back pain follow-up  Tim  BASTION Cook is a 56 y.o. male coming in with complaint of left knee pain. Month ago and did have more of a popliteal tendinitis as well as a small meniscal tear.Patient did instability and MRI did show an early OCD. Patient has been doing very well. Has started cardiac rehabilitation recently again. Patient states    Patient is also having lower back pain.  Has responded well to OMT previously. Patient at last exam patient was having some increasing. Patient and not doing exercises as regularly. Patient states known degenerative disc disease mostly from L3-S1. Patient has done remarkably well to conservative therapy. Osteopathic manipulation has been helpful. Has started cardiac rehabilitation and patient states  Past Medical History:  Diagnosis Date  . Arthritis    "knees, lower back, hands" (05/18/2016)  . CAD (coronary artery disease), native coronary artery    05/18/16  Cath severe 90% LAD prox, 99% mid, Circ dominant with 30% OM1, 50% OM2, 30% OM3, 60% distal circ and PL, severe stenosis nondominant RCA.  EF 55%  3.0x 20, 2.5 x 20 mm, 2.25 x 16 mm Promus premier sent to LAD Dr. Clifton Cook   . Chronic bronchitis (HCC)    "most years" (05/18/2016)  . Chronic lower back pain   . DDD (degenerative disc disease), lumbar   . Hyperlipidemia   . Hypertension   . Sleep apnea    "dx'd; never wore mask" (05/18/2016)  . Squamous carcinoma    cut off right upper arm, left shoulder; froze off nose and right side of face  . Type II diabetes mellitus (HCC)   . Unstable angina (HCC)   . Vomiting    Past Surgical History:  Procedure Laterality Date  . CARDIAC CATHETERIZATION N/A 05/18/2016   Procedure: Left Heart Cath and Coronary Angiography;   Surgeon: Kathleene Hazel, MD;  Location: [facility] INVASIVE CV LAB;  Service: Cardiovascular;  Laterality: N/A;  . CARDIAC CATHETERIZATION N/A 05/18/2016   Procedure: Coronary Stent Intervention;  Surgeon: Kathleene Hazel, MD;  Location: [facility] INVASIVE CV LAB;  Service: Cardiovascular;  Laterality: N/A;  . CORONARY ANGIOPLASTY    . FOOT FRACTURE SURGERY Right 1970s  . LAPAROSCOPIC GASTRIC BANDING  07/2007   by Dr. Daphine Deutscher  . PILONIDAL CYST EXCISION  ~ 1985   "took 8 out all at once"  . SQUAMOUS CELL CARCINOMA EXCISION     right upper arm, left shoulder   Social History   Social History  . Marital status: Married    Spouse name: N/A  . Number of children: N/A  . Years of education: N/A   Social History Main Topics  . Smoking status: Former Smoker    Packs/day: 0.50    Years: 12.00    Types: Cigarettes    Quit date: 12/15/1991  . Smokeless tobacco: Former Neurosurgeon    Types: Snuff     Comment: "quit snuff in ~ 1981"  . Alcohol use 4.2 oz/week    7 Cans of beer per week  . Drug use: No  . Sexual activity: Yes   Other Topics Concern  . Not on file   Social History Narrative  . No narrative on file   No Known Allergies No family history  on file. No known autoimmune disease  Past medical history, social, surgical and family history all reviewed in electronic medical record.  No pertanent information unless stated regarding to the chief complaint.   Review of Systems: No headache, visual changes, nausea, vomiting, diarrhea, constipation, dizziness, abdominal pain, skin rash, fevers, chills, night sweats, weight loss, swollen lymph nodes, body aches, joint swelling,  chest pain, shortness of breath, mood changes.  Positive muscle aches    Objective  There were no vitals taken for this visit.  Systems examined below as of 05/06/17 General: NAD A&O x3 mood, affect normal  HEENT: Pupils equal, extraocular movements intact no nystagmus Respiratory: not short of breath at rest or  with speaking Cardiovascular: No lower extremity edema, non tender Skin: Warm dry intact with no signs of infection or rash on extremities or on axial skeleton. Abdomen: Soft nontender, no masses Neuro: Cranial nerves  intact, neurovascularly intact in all extremities with 2+ DTRs and 2+ pulses. Lymph: No lymphadenopathy appreciated today  Gait normal with good balance and coordination.  MSK: Non tender with full range of motion and good stability and symmetric strength and tone of shoulders, elbows, wrist, hips and ankles bilaterally.     Knee: Normal to inspection with no erythema or effusion or obvious bony abnormalities. Palpation normal with no warmth, joint line tenderness, patellar tenderness, or condyle tenderness. ROM full in flexion and extension and lower leg rotation. Ligaments with solid consistent endpoints including ACL, PCL, LCL, MCL. Negative Mcmurray's, Apley's, and Thessalonian tests. Non painful patellar compression. Patellar glide without crepitus. Patellar and quadriceps tendons unremarkable. Hamstring and quadriceps strength is normal.    Back Exam:  Inspection: Unremarkable  Motion: Flexion 45 deg, Extension 45 deg, Side Bending to 45 deg bilaterally,  Rotation to 45 deg bilaterally  SLR laying: Negative  XSLR laying: Negative  Palpable tenderness: None. FABER: negative. Sensory change: Gross sensation intact to all lumbar and sacral dermatomes.  Reflexes: 2+ at both patellar tendons, 2+ at achilles tendons, Babinski's downgoing.  Strength at foot  Plantar-flexion: 5/5 Dorsi-flexion: 5/5 Eversion: 5/5 Inversion: 5/5  Leg strength  Quad: 5/5 Hamstring: 5/5 Hip flexor: 5/5 Hip abductors: 5/5  Gait unremarkable.    Osteopathic findings C6 flexed rotated and side bent left T3 extended rotated and side bent right inhaled third rib T9 extended rotated and side bent left L3 flexed rotated and side bent right Sacrum right on right  Impression and  Recommendations:     This case required medical decision making of moderate complexity.      Note: This dictation was prepared with Dragon dictation along with smaller phrase technology. Any transcriptional errors that result from this process are unintentional.

## 2017-05-08 ENCOUNTER — Encounter (HOSPITAL_COMMUNITY): Payer: BLUE CROSS/BLUE SHIELD

## 2017-05-08 ENCOUNTER — Ambulatory Visit: Payer: BLUE CROSS/BLUE SHIELD | Admitting: Family Medicine

## 2017-05-09 ENCOUNTER — Ambulatory Visit (INDEPENDENT_AMBULATORY_CARE_PROVIDER_SITE_OTHER): Payer: BLUE CROSS/BLUE SHIELD | Admitting: Family Medicine

## 2017-05-09 ENCOUNTER — Ambulatory Visit (INDEPENDENT_AMBULATORY_CARE_PROVIDER_SITE_OTHER)
Admission: RE | Admit: 2017-05-09 | Discharge: 2017-05-09 | Disposition: A | Discharge status: Home / Self Care | Payer: BLUE CROSS/BLUE SHIELD | Source: Ambulatory Visit | Attending: Family Medicine | Admitting: Family Medicine

## 2017-05-09 ENCOUNTER — Encounter: Payer: Self-pay | Admitting: Family Medicine

## 2017-05-09 VITALS — BP 128/80 | HR 76 | Ht 69.0 in | Wt 254.0 lb

## 2017-05-09 DIAGNOSIS — M999 Biomechanical lesion, unspecified: Secondary | ICD-10-CM

## 2017-05-09 DIAGNOSIS — M5136 Other intervertebral disc degeneration, lumbar region: Secondary | ICD-10-CM | POA: Diagnosis not present

## 2017-05-09 DIAGNOSIS — M1711 Unilateral primary osteoarthritis, right knee: Secondary | ICD-10-CM | POA: Diagnosis not present

## 2017-05-09 DIAGNOSIS — M25561 Pain in right knee: Secondary | ICD-10-CM | POA: Diagnosis not present

## 2017-05-09 HISTORY — DX: Unilateral primary osteoarthritis, right knee: M17.11

## 2017-05-09 NOTE — Assessment & Plan Note (Signed)
Decision today to treat with OMT was based on Physical Exam  After verbal consent patient was treated with HVLA, ME, FPR techniques in cervical, thoracic, lumbar and sacral areas  Patient tolerated the procedure well with improvement in symptoms  Patient given exercises, stretches and lifestyle modifications  See medications in patient instructions if given  Patient will follow up in 8 weeks 

## 2017-05-09 NOTE — Progress Notes (Signed)
Tawana ScaleZach Nan Maya D.O. [facility] [address] [place], [place] [address] Phone: [phone] Subjective:    I'm seeing this patient by the request  of:  Deatra JamesSun, Vyvyan, MD     CC: Left knee pain f/u Low back pain follow-up  BJY:[id number]:Subjective  Cleora FleetGeorge H Mccarley is a [age] y.o. male coming in with complaint of left knee pain. Month ago and did have more of a popliteal tendinitis as well as a small meniscal tear.Patient did instability and MRI did show an early OCD. Patient has been doing very well. Has started cardiac rehabilitation recently again. Patient statesDoing well overall. Patient states that he is having more difficult he with the right knee. Having more instability as well. Went out walking for longer amount of time. States that he feels that a brace will be very beneficial. Some mild swelling.   Patient is also having lower back pain.  Has responded well to OMT previously. Patient at last exam patient was having some increasing. Patient and not doing exercises as regularly. Patient states known degenerative disc disease mostly from L3-S1. Patient has done remarkably well to conservative therapy. Osteopathic manipulation has been helpful. Has started cardiac rehabilitation and patient states tightness as well. Patient has not been as active. Was on vacation for one and a half weeks.  Past Medical History:  Diagnosis Date  . Arthritis    "knees, lower back, hands" (05/18/2016)  . CAD (coronary artery disease), native coronary artery    05/18/16  Cath severe 90% LAD prox, 99% mid, Circ dominant with 30% OM1, 50% OM2, 30% OM3, 60% distal circ and PL, severe stenosis nondominant RCA.  EF 55%  3.0x 20, 2.5 x 20 mm, 2.25 x 16 mm Promus premier sent to LAD Dr. Clifton JamesMcALhany   . Chronic bronchitis (HCC)    "most years" (05/18/2016)  . Chronic lower back pain   . DDD (degenerative disc disease), lumbar   . Hyperlipidemia   . Hypertension   . Sleep apnea    "dx'd; never wore mask" (05/18/2016)  .  Squamous carcinoma    cut off right upper arm, left shoulder; froze off nose and right side of face  . Type II diabetes mellitus (HCC)   . Unstable angina (HCC)   . Vomiting    Past Surgical History:  Procedure Laterality Date  . CARDIAC CATHETERIZATION N/A 05/18/2016   Procedure: Left Heart Cath and Coronary Angiography;  Surgeon: Kathleene Hazelhristopher D McAlhany, MD;  [facility];  Service: Cardiovascular;  Laterality: N/A;  . CARDIAC CATHETERIZATION N/A 05/18/2016   Procedure: Coronary Stent Intervention;  Surgeon: Kathleene Hazelhristopher D McAlhany, MD;  Location: [facility] INVASIVE CV LAB;  Service: Cardiovascular;  Laterality: N/A;  . CORONARY ANGIOPLASTY    . FOOT FRACTURE SURGERY Right 1970s  . LAPAROSCOPIC GASTRIC BANDING  07/2007   by Dr. Daphine DeutscherMartin  . PILONIDAL CYST EXCISION  ~ 1985   "took 8 out all at once"  . SQUAMOUS CELL CARCINOMA EXCISION     right upper arm, left shoulder   Social History   Social History  . Marital status: Married    Spouse name: N/A  . Number of children: N/A  . Years of education: N/A   Social History Main Topics  . Smoking status: Former Smoker    Packs/day: 0.50    Years: 12.00    Types: Cigarettes    Quit date: 12/15/1991  . Smokeless tobacco: Former NeurosurgeonUser    Types: Snuff     Comment: "  quit snuff in ~ 1981"  . Alcohol use 4.2 oz/week    7 Cans of beer per week  . Drug use: No  . Sexual activity: Yes   Other Topics Concern  . None   Social History Narrative  . None   No Known Allergies No family history on file. No known autoimmune disease  Past medical history, social, surgical and family history all reviewed in electronic medical record.  No pertanent information unless stated regarding to the chief complaint.   Review of Systems: No headache, visual changes, nausea, vomiting, diarrhea, constipation, dizziness, abdominal pain, skin rash, fevers, chills, night sweats, weight loss, swollen lymph nodes, body aches, joint swelling,  chest pain,  shortness of breath, mood changes.  Positive muscle aches  Objective  Blood pressure 128/80, pulse 76, height 5\' 9"  (1.753 m), weight 254 lb (115.2 kg).  Systems examined below as of 05/09/17 General: NAD A&O x3 mood, affect normal  HEENT: Pupils equal, extraocular movements intact no nystagmus Respiratory: not short of breath at rest or with speaking Cardiovascular: No lower extremity edema, non tender Skin: Warm dry intact with no signs of infection or rash on extremities or on axial skeleton. Abdomen: Soft nontender, no masses Neuro: Cranial nerves  intact, neurovascularly intact in all extremities with 2+ DTRs and 2+ pulses. Lymph: No lymphadenopathy appreciated today  Gait normal with good balance and coordination.  MSK: Non tender with full range of motion and good stability and symmetric strength and tone of shoulders, elbows, wrist,  hips and ankles bilaterally.     Knee: Right Normal to inspection with no erythema or effusion or obvious bony abnormalities. Tender to palpation over the patellofemoral joint ROM full in flexion and extension and lower leg rotation. Ligaments with solid consistent endpoints including ACL, PCL, LCL, MCL. Negative Mcmurray's, Apley's, and Thessalonian tests. painful patellar compression. Patellar glide with mild crepitus. Patellar and quadriceps tendons unremarkable. Hamstring and quadriceps strength is normal.  Contralateral knee also has some mild positive patellar compression but no significant instability noted today.   Back Exam:  Inspection: Unremarkable  Motion: Flexion 45 deg, Extension 30 deg, Side Bending to 45 deg bilaterally,  Rotation to 45 deg bilaterally  SLR laying: Negative  XSLR laying: Negative  Palpable tenderness: Tender to palpation in the paraspinal muscles her lumbar spine. FABER: negative. Sensory change: Gross sensation intact to all lumbar and sacral dermatomes.  Reflexes: 2+ at both patellar tendons, 2+ at  achilles tendons, Babinski's downgoing.  Strength at foot  Plantar-flexion: 5/5 Dorsi-flexion: 5/5 Eversion: 5/5 Inversion: 5/5  Leg strength  Quad: 5/5 Hamstring: 5/5 Hip flexor: 5/5 Hip abductors: 5/5  Gait unremarkable.   Osteopathic findings C2 flexed rotated and side bent right C4 flexed rotated and side bent left C7 flexed rotated and side bent left T3 extended rotated and side bent right inhaled third rib T9 extended rotated and side bent left L3 flexed rotated and side bent right Sacrum right on right   Impression and Recommendations:     This case required medical decision making of moderate complexity.      Note: This dictation was prepared with Dragon dictation along with smaller phrase technology. Any transcriptional errors that result from this process are unintentional.

## 2017-05-09 NOTE — Assessment & Plan Note (Signed)
Stable at the moment. Discussed with patient again. Patient continued to be active otherwise. Follow-up again with me in 8 weeks

## 2017-05-09 NOTE — Patient Instructions (Signed)
Good to see you We will get xray of the right knee Ice is your friend.  Stay active.  Keep doing everything else Have fun at the tournament.  See me again in 8 weeks.

## 2017-05-09 NOTE — Addendum Note (Signed)
Addended by: Edwena FeltyARSON, Maven Varelas T on: 05/09/2017 01:21 PM   Modules accepted: Orders

## 2017-05-09 NOTE — Assessment & Plan Note (Signed)
Degenerative arthritis of the knee. Because patient at great length. Patient will have x-rays done today. We will do bracing as well. Patient will continue conservative therapy. Doing cardiac rehabilitation. Follow-up again in 8 weeks worsening symptoms consider injection

## 2017-05-10 ENCOUNTER — Encounter (HOSPITAL_COMMUNITY)
Admission: RE | Admit: 2017-05-10 | Discharge: 2017-05-10 | Disposition: A | Discharge status: Home / Self Care | Payer: BLUE CROSS/BLUE SHIELD | Source: Ambulatory Visit | Attending: Cardiology | Admitting: Cardiology

## 2017-05-10 DIAGNOSIS — Z955 Presence of coronary angioplasty implant and graft: Secondary | ICD-10-CM | POA: Diagnosis not present

## 2017-05-10 DIAGNOSIS — Z87891 Personal history of nicotine dependence: Secondary | ICD-10-CM | POA: Diagnosis not present

## 2017-05-10 DIAGNOSIS — E119 Type 2 diabetes mellitus without complications: Secondary | ICD-10-CM | POA: Diagnosis not present

## 2017-05-10 DIAGNOSIS — Z85828 Personal history of other malignant neoplasm of skin: Secondary | ICD-10-CM | POA: Diagnosis not present

## 2017-05-10 DIAGNOSIS — Z7984 Long term (current) use of oral hypoglycemic drugs: Secondary | ICD-10-CM | POA: Diagnosis not present

## 2017-05-10 DIAGNOSIS — I10 Essential (primary) hypertension: Secondary | ICD-10-CM | POA: Diagnosis not present

## 2017-05-10 DIAGNOSIS — M1990 Unspecified osteoarthritis, unspecified site: Secondary | ICD-10-CM | POA: Diagnosis not present

## 2017-05-10 DIAGNOSIS — G4730 Sleep apnea, unspecified: Secondary | ICD-10-CM | POA: Diagnosis not present

## 2017-05-10 DIAGNOSIS — G8929 Other chronic pain: Secondary | ICD-10-CM | POA: Diagnosis not present

## 2017-05-10 DIAGNOSIS — M5136 Other intervertebral disc degeneration, lumbar region: Secondary | ICD-10-CM | POA: Diagnosis not present

## 2017-05-10 DIAGNOSIS — I2510 Atherosclerotic heart disease of native coronary artery without angina pectoris: Secondary | ICD-10-CM | POA: Diagnosis not present

## 2017-05-10 DIAGNOSIS — E785 Hyperlipidemia, unspecified: Secondary | ICD-10-CM | POA: Diagnosis not present

## 2017-05-10 DIAGNOSIS — Z79899 Other long term (current) drug therapy: Secondary | ICD-10-CM | POA: Diagnosis not present

## 2017-05-10 DIAGNOSIS — Z7982 Long term (current) use of aspirin: Secondary | ICD-10-CM | POA: Diagnosis not present

## 2017-05-10 DIAGNOSIS — Z7902 Long term (current) use of antithrombotics/antiplatelets: Secondary | ICD-10-CM | POA: Diagnosis not present

## 2017-05-10 LAB — GLUCOSE, CAPILLARY: Glucose-Capillary: 216 mg/dL — ABNORMAL HIGH (ref 65–99)

## 2017-05-12 ENCOUNTER — Encounter (HOSPITAL_COMMUNITY): Payer: BLUE CROSS/BLUE SHIELD

## 2017-05-15 ENCOUNTER — Encounter (HOSPITAL_COMMUNITY)
Admission: RE | Admit: 2017-05-15 | Discharge: 2017-05-15 | Disposition: A | Discharge status: Home / Self Care | Payer: BLUE CROSS/BLUE SHIELD | Source: Ambulatory Visit | Attending: Cardiology | Admitting: Cardiology

## 2017-05-15 DIAGNOSIS — G8929 Other chronic pain: Secondary | ICD-10-CM | POA: Diagnosis not present

## 2017-05-15 DIAGNOSIS — E785 Hyperlipidemia, unspecified: Secondary | ICD-10-CM | POA: Diagnosis not present

## 2017-05-15 DIAGNOSIS — Z85828 Personal history of other malignant neoplasm of skin: Secondary | ICD-10-CM | POA: Diagnosis not present

## 2017-05-15 DIAGNOSIS — Z7902 Long term (current) use of antithrombotics/antiplatelets: Secondary | ICD-10-CM | POA: Diagnosis not present

## 2017-05-15 DIAGNOSIS — G4730 Sleep apnea, unspecified: Secondary | ICD-10-CM | POA: Diagnosis not present

## 2017-05-15 DIAGNOSIS — E119 Type 2 diabetes mellitus without complications: Secondary | ICD-10-CM | POA: Diagnosis not present

## 2017-05-15 DIAGNOSIS — Z955 Presence of coronary angioplasty implant and graft: Secondary | ICD-10-CM | POA: Diagnosis not present

## 2017-05-15 DIAGNOSIS — M5136 Other intervertebral disc degeneration, lumbar region: Secondary | ICD-10-CM | POA: Diagnosis not present

## 2017-05-15 DIAGNOSIS — Z87891 Personal history of nicotine dependence: Secondary | ICD-10-CM | POA: Diagnosis not present

## 2017-05-15 DIAGNOSIS — Z7982 Long term (current) use of aspirin: Secondary | ICD-10-CM | POA: Diagnosis not present

## 2017-05-15 DIAGNOSIS — Z7984 Long term (current) use of oral hypoglycemic drugs: Secondary | ICD-10-CM | POA: Diagnosis not present

## 2017-05-15 DIAGNOSIS — I10 Essential (primary) hypertension: Secondary | ICD-10-CM | POA: Diagnosis not present

## 2017-05-15 DIAGNOSIS — M1990 Unspecified osteoarthritis, unspecified site: Secondary | ICD-10-CM | POA: Diagnosis not present

## 2017-05-15 DIAGNOSIS — M1711 Unilateral primary osteoarthritis, right knee: Secondary | ICD-10-CM | POA: Diagnosis not present

## 2017-05-15 DIAGNOSIS — I2510 Atherosclerotic heart disease of native coronary artery without angina pectoris: Secondary | ICD-10-CM | POA: Diagnosis not present

## 2017-05-15 DIAGNOSIS — Z79899 Other long term (current) drug therapy: Secondary | ICD-10-CM | POA: Diagnosis not present

## 2017-05-15 LAB — GLUCOSE, CAPILLARY: GLUCOSE-CAPILLARY: 166 mg/dL — AB (ref 65–99)

## 2017-05-17 ENCOUNTER — Encounter (HOSPITAL_COMMUNITY)
Admission: RE | Admit: 2017-05-17 | Discharge: 2017-05-17 | Disposition: A | Discharge status: Home / Self Care | Payer: BLUE CROSS/BLUE SHIELD | Source: Ambulatory Visit | Attending: Cardiology | Admitting: Cardiology

## 2017-05-17 DIAGNOSIS — I2510 Atherosclerotic heart disease of native coronary artery without angina pectoris: Secondary | ICD-10-CM | POA: Diagnosis not present

## 2017-05-17 DIAGNOSIS — Z87891 Personal history of nicotine dependence: Secondary | ICD-10-CM | POA: Diagnosis not present

## 2017-05-17 DIAGNOSIS — G8929 Other chronic pain: Secondary | ICD-10-CM | POA: Diagnosis not present

## 2017-05-17 DIAGNOSIS — E785 Hyperlipidemia, unspecified: Secondary | ICD-10-CM | POA: Diagnosis not present

## 2017-05-17 DIAGNOSIS — Z955 Presence of coronary angioplasty implant and graft: Secondary | ICD-10-CM | POA: Diagnosis not present

## 2017-05-17 DIAGNOSIS — Z7984 Long term (current) use of oral hypoglycemic drugs: Secondary | ICD-10-CM | POA: Diagnosis not present

## 2017-05-17 DIAGNOSIS — I10 Essential (primary) hypertension: Secondary | ICD-10-CM | POA: Diagnosis not present

## 2017-05-17 DIAGNOSIS — G4730 Sleep apnea, unspecified: Secondary | ICD-10-CM | POA: Diagnosis not present

## 2017-05-17 DIAGNOSIS — Z79899 Other long term (current) drug therapy: Secondary | ICD-10-CM | POA: Diagnosis not present

## 2017-05-17 DIAGNOSIS — Z7982 Long term (current) use of aspirin: Secondary | ICD-10-CM | POA: Diagnosis not present

## 2017-05-17 DIAGNOSIS — Z7902 Long term (current) use of antithrombotics/antiplatelets: Secondary | ICD-10-CM | POA: Diagnosis not present

## 2017-05-17 DIAGNOSIS — M1990 Unspecified osteoarthritis, unspecified site: Secondary | ICD-10-CM | POA: Diagnosis not present

## 2017-05-17 DIAGNOSIS — M5136 Other intervertebral disc degeneration, lumbar region: Secondary | ICD-10-CM | POA: Diagnosis not present

## 2017-05-17 DIAGNOSIS — E119 Type 2 diabetes mellitus without complications: Secondary | ICD-10-CM | POA: Diagnosis not present

## 2017-05-17 DIAGNOSIS — Z85828 Personal history of other malignant neoplasm of skin: Secondary | ICD-10-CM | POA: Diagnosis not present

## 2017-05-17 LAB — GLUCOSE, CAPILLARY: Glucose-Capillary: 161 mg/dL — ABNORMAL HIGH (ref 65–99)

## 2017-05-19 ENCOUNTER — Encounter (HOSPITAL_COMMUNITY)
Admission: RE | Admit: 2017-05-19 | Discharge: 2017-05-19 | Disposition: A | Discharge status: Home / Self Care | Payer: BLUE CROSS/BLUE SHIELD | Source: Ambulatory Visit | Attending: Cardiology | Admitting: Cardiology

## 2017-05-19 DIAGNOSIS — M1990 Unspecified osteoarthritis, unspecified site: Secondary | ICD-10-CM | POA: Diagnosis not present

## 2017-05-19 DIAGNOSIS — Z7984 Long term (current) use of oral hypoglycemic drugs: Secondary | ICD-10-CM | POA: Diagnosis not present

## 2017-05-19 DIAGNOSIS — M5136 Other intervertebral disc degeneration, lumbar region: Secondary | ICD-10-CM | POA: Diagnosis not present

## 2017-05-19 DIAGNOSIS — Z7982 Long term (current) use of aspirin: Secondary | ICD-10-CM | POA: Diagnosis not present

## 2017-05-19 DIAGNOSIS — Z85828 Personal history of other malignant neoplasm of skin: Secondary | ICD-10-CM | POA: Diagnosis not present

## 2017-05-19 DIAGNOSIS — I10 Essential (primary) hypertension: Secondary | ICD-10-CM | POA: Diagnosis not present

## 2017-05-19 DIAGNOSIS — Z79899 Other long term (current) drug therapy: Secondary | ICD-10-CM | POA: Diagnosis not present

## 2017-05-19 DIAGNOSIS — G4730 Sleep apnea, unspecified: Secondary | ICD-10-CM | POA: Diagnosis not present

## 2017-05-19 DIAGNOSIS — Z955 Presence of coronary angioplasty implant and graft: Secondary | ICD-10-CM

## 2017-05-19 DIAGNOSIS — I2510 Atherosclerotic heart disease of native coronary artery without angina pectoris: Secondary | ICD-10-CM | POA: Diagnosis not present

## 2017-05-19 DIAGNOSIS — G8929 Other chronic pain: Secondary | ICD-10-CM | POA: Diagnosis not present

## 2017-05-19 DIAGNOSIS — Z87891 Personal history of nicotine dependence: Secondary | ICD-10-CM | POA: Diagnosis not present

## 2017-05-19 DIAGNOSIS — Z7902 Long term (current) use of antithrombotics/antiplatelets: Secondary | ICD-10-CM | POA: Diagnosis not present

## 2017-05-19 DIAGNOSIS — E119 Type 2 diabetes mellitus without complications: Secondary | ICD-10-CM | POA: Diagnosis not present

## 2017-05-19 DIAGNOSIS — E785 Hyperlipidemia, unspecified: Secondary | ICD-10-CM | POA: Diagnosis not present

## 2017-05-19 LAB — GLUCOSE, CAPILLARY: Glucose-Capillary: 155 mg/dL — ABNORMAL HIGH (ref 65–99)

## 2017-05-22 ENCOUNTER — Encounter (HOSPITAL_COMMUNITY)
Admission: RE | Admit: 2017-05-22 | Discharge: 2017-05-22 | Disposition: A | Discharge status: Home / Self Care | Payer: BLUE CROSS/BLUE SHIELD | Source: Ambulatory Visit | Attending: Cardiology | Admitting: Cardiology

## 2017-05-22 DIAGNOSIS — M5136 Other intervertebral disc degeneration, lumbar region: Secondary | ICD-10-CM | POA: Diagnosis not present

## 2017-05-22 DIAGNOSIS — M1990 Unspecified osteoarthritis, unspecified site: Secondary | ICD-10-CM | POA: Diagnosis not present

## 2017-05-22 DIAGNOSIS — Z87891 Personal history of nicotine dependence: Secondary | ICD-10-CM | POA: Diagnosis not present

## 2017-05-22 DIAGNOSIS — E785 Hyperlipidemia, unspecified: Secondary | ICD-10-CM | POA: Diagnosis not present

## 2017-05-22 DIAGNOSIS — I2510 Atherosclerotic heart disease of native coronary artery without angina pectoris: Secondary | ICD-10-CM | POA: Diagnosis not present

## 2017-05-22 DIAGNOSIS — E119 Type 2 diabetes mellitus without complications: Secondary | ICD-10-CM | POA: Diagnosis not present

## 2017-05-22 DIAGNOSIS — Z7984 Long term (current) use of oral hypoglycemic drugs: Secondary | ICD-10-CM | POA: Diagnosis not present

## 2017-05-22 DIAGNOSIS — I10 Essential (primary) hypertension: Secondary | ICD-10-CM | POA: Diagnosis not present

## 2017-05-22 DIAGNOSIS — Z79899 Other long term (current) drug therapy: Secondary | ICD-10-CM | POA: Diagnosis not present

## 2017-05-22 DIAGNOSIS — Z955 Presence of coronary angioplasty implant and graft: Secondary | ICD-10-CM | POA: Diagnosis not present

## 2017-05-22 DIAGNOSIS — Z7902 Long term (current) use of antithrombotics/antiplatelets: Secondary | ICD-10-CM | POA: Diagnosis not present

## 2017-05-22 DIAGNOSIS — G8929 Other chronic pain: Secondary | ICD-10-CM | POA: Diagnosis not present

## 2017-05-22 DIAGNOSIS — G4730 Sleep apnea, unspecified: Secondary | ICD-10-CM | POA: Diagnosis not present

## 2017-05-22 DIAGNOSIS — Z85828 Personal history of other malignant neoplasm of skin: Secondary | ICD-10-CM | POA: Diagnosis not present

## 2017-05-22 DIAGNOSIS — Z7982 Long term (current) use of aspirin: Secondary | ICD-10-CM | POA: Diagnosis not present

## 2017-05-22 LAB — GLUCOSE, CAPILLARY: GLUCOSE-CAPILLARY: 146 mg/dL — AB (ref 65–99)

## 2017-05-24 ENCOUNTER — Encounter (HOSPITAL_COMMUNITY)
Admission: RE | Admit: 2017-05-24 | Discharge: 2017-05-24 | Disposition: A | Discharge status: Home / Self Care | Payer: BLUE CROSS/BLUE SHIELD | Source: Ambulatory Visit | Attending: Cardiology | Admitting: Cardiology

## 2017-05-24 DIAGNOSIS — Z79899 Other long term (current) drug therapy: Secondary | ICD-10-CM | POA: Diagnosis not present

## 2017-05-24 DIAGNOSIS — M5136 Other intervertebral disc degeneration, lumbar region: Secondary | ICD-10-CM | POA: Diagnosis not present

## 2017-05-24 DIAGNOSIS — I10 Essential (primary) hypertension: Secondary | ICD-10-CM | POA: Diagnosis not present

## 2017-05-24 DIAGNOSIS — Z7984 Long term (current) use of oral hypoglycemic drugs: Secondary | ICD-10-CM | POA: Diagnosis not present

## 2017-05-24 DIAGNOSIS — E785 Hyperlipidemia, unspecified: Secondary | ICD-10-CM | POA: Diagnosis not present

## 2017-05-24 DIAGNOSIS — I2510 Atherosclerotic heart disease of native coronary artery without angina pectoris: Secondary | ICD-10-CM | POA: Diagnosis not present

## 2017-05-24 DIAGNOSIS — G4730 Sleep apnea, unspecified: Secondary | ICD-10-CM | POA: Diagnosis not present

## 2017-05-24 DIAGNOSIS — E119 Type 2 diabetes mellitus without complications: Secondary | ICD-10-CM | POA: Diagnosis not present

## 2017-05-24 DIAGNOSIS — G8929 Other chronic pain: Secondary | ICD-10-CM | POA: Diagnosis not present

## 2017-05-24 DIAGNOSIS — Z87891 Personal history of nicotine dependence: Secondary | ICD-10-CM | POA: Diagnosis not present

## 2017-05-24 DIAGNOSIS — Z85828 Personal history of other malignant neoplasm of skin: Secondary | ICD-10-CM | POA: Diagnosis not present

## 2017-05-24 DIAGNOSIS — Z7902 Long term (current) use of antithrombotics/antiplatelets: Secondary | ICD-10-CM | POA: Diagnosis not present

## 2017-05-24 DIAGNOSIS — Z7982 Long term (current) use of aspirin: Secondary | ICD-10-CM | POA: Diagnosis not present

## 2017-05-24 DIAGNOSIS — M1990 Unspecified osteoarthritis, unspecified site: Secondary | ICD-10-CM | POA: Diagnosis not present

## 2017-05-24 DIAGNOSIS — Z955 Presence of coronary angioplasty implant and graft: Secondary | ICD-10-CM

## 2017-05-24 LAB — GLUCOSE, CAPILLARY: GLUCOSE-CAPILLARY: 150 mg/dL — AB (ref 65–99)

## 2017-05-24 NOTE — Progress Notes (Signed)
Reviewed home exercise guidelines with patient including endpoints, temperature precautions, target heart rate and rate of perceived exertion. Pt is walking daily as his mode of home exercise. Pt voices understanding of instructions given. Dafney Farler M Sosie Gato, MS, ACSM CEP  

## 2017-05-25 DIAGNOSIS — E1165 Type 2 diabetes mellitus with hyperglycemia: Secondary | ICD-10-CM | POA: Diagnosis not present

## 2017-05-25 DIAGNOSIS — E6609 Other obesity due to excess calories: Secondary | ICD-10-CM | POA: Diagnosis not present

## 2017-05-25 DIAGNOSIS — E782 Mixed hyperlipidemia: Secondary | ICD-10-CM | POA: Diagnosis not present

## 2017-05-25 DIAGNOSIS — I10 Essential (primary) hypertension: Secondary | ICD-10-CM | POA: Diagnosis not present

## 2017-05-26 ENCOUNTER — Encounter (HOSPITAL_COMMUNITY)
Admission: RE | Admit: 2017-05-26 | Discharge: 2017-05-26 | Disposition: A | Discharge status: Home / Self Care | Payer: BLUE CROSS/BLUE SHIELD | Source: Ambulatory Visit | Attending: Cardiology | Admitting: Cardiology

## 2017-05-26 ENCOUNTER — Encounter (HOSPITAL_COMMUNITY): Payer: Self-pay

## 2017-05-26 VITALS — BP 104/64 | HR 97 | Ht 68.25 in | Wt 252.2 lb

## 2017-05-26 DIAGNOSIS — Z7902 Long term (current) use of antithrombotics/antiplatelets: Secondary | ICD-10-CM | POA: Diagnosis not present

## 2017-05-26 DIAGNOSIS — Z85828 Personal history of other malignant neoplasm of skin: Secondary | ICD-10-CM | POA: Diagnosis not present

## 2017-05-26 DIAGNOSIS — M1990 Unspecified osteoarthritis, unspecified site: Secondary | ICD-10-CM | POA: Diagnosis not present

## 2017-05-26 DIAGNOSIS — E785 Hyperlipidemia, unspecified: Secondary | ICD-10-CM | POA: Diagnosis not present

## 2017-05-26 DIAGNOSIS — Z955 Presence of coronary angioplasty implant and graft: Secondary | ICD-10-CM

## 2017-05-26 DIAGNOSIS — Z7984 Long term (current) use of oral hypoglycemic drugs: Secondary | ICD-10-CM | POA: Diagnosis not present

## 2017-05-26 DIAGNOSIS — Z79899 Other long term (current) drug therapy: Secondary | ICD-10-CM | POA: Diagnosis not present

## 2017-05-26 DIAGNOSIS — I2510 Atherosclerotic heart disease of native coronary artery without angina pectoris: Secondary | ICD-10-CM | POA: Diagnosis not present

## 2017-05-26 DIAGNOSIS — Z87891 Personal history of nicotine dependence: Secondary | ICD-10-CM | POA: Diagnosis not present

## 2017-05-26 DIAGNOSIS — I10 Essential (primary) hypertension: Secondary | ICD-10-CM | POA: Diagnosis not present

## 2017-05-26 DIAGNOSIS — Z7982 Long term (current) use of aspirin: Secondary | ICD-10-CM | POA: Diagnosis not present

## 2017-05-26 DIAGNOSIS — E119 Type 2 diabetes mellitus without complications: Secondary | ICD-10-CM | POA: Diagnosis not present

## 2017-05-26 DIAGNOSIS — M5136 Other intervertebral disc degeneration, lumbar region: Secondary | ICD-10-CM | POA: Diagnosis not present

## 2017-05-26 DIAGNOSIS — G8929 Other chronic pain: Secondary | ICD-10-CM | POA: Diagnosis not present

## 2017-05-26 DIAGNOSIS — G4730 Sleep apnea, unspecified: Secondary | ICD-10-CM | POA: Diagnosis not present

## 2017-05-26 LAB — GLUCOSE, CAPILLARY: GLUCOSE-CAPILLARY: 151 mg/dL — AB (ref 65–99)

## 2017-06-27 NOTE — Addendum Note (Signed)
Encounter addended by: Artist Pais on: 06/27/2017 12:21 PM<BR>    Actions taken: Flowsheet accepted, Flowsheet data copied forward, Visit Navigator Flowsheet section accepted, Vitals modified

## 2017-06-28 NOTE — Addendum Note (Signed)
Encounter addended by: Jacques Earthly, RD on: 06/28/2017 12:17 PM<BR>    Actions taken: Flowsheet data copied forward, Visit Navigator Flowsheet section accepted

## 2017-07-04 ENCOUNTER — Ambulatory Visit: Payer: BLUE CROSS/BLUE SHIELD | Admitting: Family Medicine

## 2017-07-16 DIAGNOSIS — Z23 Encounter for immunization: Secondary | ICD-10-CM | POA: Diagnosis not present

## 2017-08-01 NOTE — Addendum Note (Signed)
Encounter addended by: Robyne Peersion, Jaron Czarnecki H, RN on: 08/01/2017 12:29 PM  Actions taken: Sign clinical note, Episode resolved

## 2017-08-01 NOTE — Progress Notes (Signed)
Discharge Progress Report  Patient Details  Name: Tim Cook MRN: [id number] Date of Birth: Jun 24, 1961 Referring Provider:     CARDIAC REHAB PHASE II ORIENTATION from 04/06/2017 in [facility] CARDIAC [facility]  Referring Provider  Viann Fish MD       Number of Visits: 13   Reason for Discharge:  Patient independent in their exercise.  Smoking History:  Social History   Tobacco Use  Smoking Status Former Smoker  . Packs/day: 0.50  . Years: 12.00  . Pack years: 6.00  . Types: Cigarettes  . Last attempt to quit: 12/15/1991  . Years since quitting: 25.6  Smokeless Tobacco Former Neurosurgeon  . Types: Snuff  Tobacco Comment   "quit snuff in ~ 1981"    Diagnosis:  05/18/16 Status post coronary artery stent placement  ADL [facility]SD:   Initial Exercise Prescription: Initial Exercise Prescription - 04/06/17 1100      Date of Initial Exercise RX and Referring Provider   Date  04/06/17    Referring Provider  Viann Fish MD      Recumbant Bike   Level  1.5    Minutes  10    METs  2.5      NuStep   Level  3    SPM  85    Minutes  10    METs  2.1      Track   Laps  10    Minutes  10    METs  2.74      Prescription Details   Frequency (times per week)  2    Duration  Progress to 45 minutes of aerobic exercise without signs/symptoms of physical distress      Intensity   THRR 40-80% of Max Heartrate  66-133    Ratings of Perceived Exertion  11-13    Perceived Dyspnea  0-4      Progression   Progression  Continue to progress workloads to maintain intensity without signs/symptoms of physical distress.      Resistance Training   Training Prescription  Yes    Weight  3    Reps  10-15       Discharge Exercise Prescription (Final Exercise Prescription Changes): Exercise Prescription Changes - 05/26/17 0743      Response to Exercise   Blood Pressure (Admit)  104/64    Blood Pressure (Exercise)  104/70    Blood Pressure (Exit)  98/68    Heart Rate (Admit)  97 bpm    Heart Rate (Exercise)  116 bpm    Heart Rate (Exit)  96 bpm    Rating of Perceived Exertion (Exercise)  12    Symptoms  none    Duration  Continue with 30 min of aerobic exercise without signs/symptoms of physical distress.    Intensity  THRR unchanged      Progression   Progression  Continue to progress workloads to maintain intensity without signs/symptoms of physical distress.    Average METs  3.1      Resistance Training   Training Prescription  Yes    Weight  5lbs    Reps  10-15    Time  10 Minutes      Interval Training   Interval Training  No      Recumbant Bike   Level  2    Minutes  10      NuStep   Level  4    SPM  85    Minutes  10  METs  3.6      Track   Laps  12    Minutes  10    METs  3.09      Home Exercise Plan   Plans to continue exercise at  Home (comment)    Frequency  Add 2 additional days to program exercise sessions.    Initial Home Exercises Provided  05/24/17       Functional Capacity: 6 Minute Walk    Row Name 04/06/17 1032 05/24/17 0702       6 Minute Walk   Phase  Initial  Discharge    Distance  1479 feet  1558 feet    Distance % Change  -  5.34 %    Walk Time  6 minutes  6 minutes    # of Rest Breaks  0  0    MPH  2.8  2.95    METS  3.45  3.46    RPE  11  10    VO2 Peak  12.08  12.1    Symptoms  No  No    Resting HR  86 bpm  90 bpm    Resting BP  114/60  114/68    Max Ex. HR  102 bpm  109 bpm    Max Ex. BP  124/64  102/78    2 Minute Post BP  122/70  -       Psychological, QOL, Others - Outcomes: PHQ 2/9: [facility] 05/26/2017 04/12/2017  Decreased Interest 0 0  Down, Depressed, Hopeless 0 0  PHQ - 2 Score 0 0    Quality of Life: Quality of Life - 05/24/17 1754      Quality of Life Scores   Health/Function Pre  27.8 %    Health/Function Post  22.81 %    Health/Function % Change  -17.95 %    Socioeconomic Pre  25.07 %    Socioeconomic Post  23.5 %     Socioeconomic % Change   -6.26 %    Psych/Spiritual Pre  24.64 %    Psych/Spiritual Post  24 %    Psych/Spiritual % Change  -2.6 %    Family Pre  28.8 %    Family Post  26.4 %    Family % Change  -8.33 %    GLOBAL Pre  24.53 %    GLOBAL Post  23.78 %    GLOBAL % Change  -3.06 %       Personal Goals: Goals established at orientation with interventions provided to work toward goal. Personal Goals and Risk Factors at Admission - 04/06/17 1158      Core Components/Risk Factors/Patient Goals on Admission    Weight Management  Yes    Intervention  Weight Management: Develop a combined nutrition and exercise program designed to reach desired caloric intake, while maintaining appropriate intake of nutrient and fiber, sodium and fats, and appropriate energy expenditure required for the weight goal.;Weight Management: Provide education and appropriate resources to help participant work on and attain dietary goals.;Weight Management/Obesity: Establish reasonable short term and long term weight goals.;Obesity: Provide education and appropriate resources to help participant work on and attain dietary goals.    Expected Outcomes  Short Term: Continue to assess and modify interventions until short term weight is achieved;Long Term: Adherence to nutrition and physical activity/exercise program aimed toward attainment of established weight goal;Weight Maintenance: Understanding of the daily nutrition guidelines, which includes 25-35% calories from fat, 7% or less  cal from saturated fats, less than 200mg  cholesterol, less than 1.5gm of sodium, & 5 or more servings of fruits and vegetables daily;Weight Loss: Understanding of general recommendations for a balanced deficit meal plan, which promotes 1-2 lb weight loss per week and includes a negative energy balance of [phone] kcal/d;Understanding recommendations for meals to include 15-35% energy as protein, 25-35% energy from fat, 35-60% energy from carbohydrates,  less than 200mg  of dietary cholesterol, 20-35 gm of total fiber daily;Understanding of distribution of calorie intake throughout the day with the consumption of 4-5 meals/snacks    Diabetes  Yes    Intervention  Provide education about signs/symptoms and action to take for hypo/hyperglycemia.;Provide education about proper nutrition, including hydration, and aerobic/resistive exercise prescription along with prescribed medications to achieve blood glucose in normal ranges: Fasting glucose 65-99 mg/dL    Expected Outcomes  Short Term: Participant verbalizes understanding of the signs/symptoms and immediate care of hyper/hypoglycemia, proper foot care and importance of medication, aerobic/resistive exercise and nutrition plan for blood glucose control.;Long Term: Attainment of HbA1C < 7%.    Hypertension  Yes    Intervention  Provide education on lifestyle modifcations including regular physical activity/exercise, weight management, moderate sodium restriction and increased consumption of fresh fruit, vegetables, and low fat dairy, alcohol moderation, and smoking cessation.;Monitor prescription use compliance.    Expected Outcomes  Short Term: Continued assessment and intervention until BP is < 140/41mm HG in hypertensive participants. < 130/52mm HG in hypertensive participants with diabetes, heart failure or chronic kidney disease.;Long Term: Maintenance of blood pressure at goal levels.    Lipids  Yes    Intervention  Provide education and support for participant on nutrition & aerobic/resistive exercise along with prescribed medications to achieve LDL 70mg , HDL >40mg .    Expected Outcomes  Short Term: Participant states understanding of desired cholesterol values and is compliant with medications prescribed. Participant is following exercise prescription and nutrition guidelines.;Long Term: Cholesterol controlled with medications as prescribed, with individualized exercise RX and with personalized  nutrition plan. Value goals: LDL < 70mg , HDL > 40 mg.        Personal Goals Discharge: Goals and Risk Factor Review    Row Name 04/11/17 1545 05/09/17 1006 05/26/17 1743         Core Components/Risk Factors/Patient Goals Review   Personal Goals Review  Weight Management/Obesity;Diabetes;Hypertension;Lipids  Weight Management/Obesity;Diabetes;Hypertension;Lipids  Weight Management/Obesity;Diabetes;Hypertension;Lipids     Review  -  pt demonstrates eagerness to participate in CR activities.    pt completed cardiac rehab program with  13 sessions. pt plans to continue exercising on his own and perhaps cardiac maintenance program. pt encouraged to continue lifestyle modificaitons to reduce risk factors.      Expected Outcomes  pt will participate in CR exercise, nutrition and lifestyle education classes to decrease overall CAD RF.   pt will participate in CR exercise, nutrition and lifestyle education classes to decrease overall CAD RF.   pt will participate in  exercise, nutrition and lifestyle education classes to decrease overall CAD RF.         Exercise Goals and Review: Exercise Goals    Row Name 04/06/17 1002             Exercise Goals   Increase Physical Activity  Yes       Intervention  Provide advice, education, support and counseling about physical activity/exercise needs.;Develop an individualized exercise prescription for aerobic and resistive training based on initial evaluation findings, risk stratification, comorbidities and participant's personal  goals.       Expected Outcomes  Achievement of increased cardiorespiratory fitness and enhanced flexibility, muscular endurance and strength shown through measurements of functional capacity and personal statement of participant.       Increase Strength and Stamina  Yes       Intervention  Provide advice, education, support and counseling about physical activity/exercise needs.;Develop an individualized exercise prescription for  aerobic and resistive training based on initial evaluation findings, risk stratification, comorbidities and participant's personal goals.       Expected Outcomes  Achievement of increased cardiorespiratory fitness and enhanced flexibility, muscular endurance and strength shown through measurements of functional capacity and personal statement of participant.          Nutrition & Weight - Outcomes: Pre Biometrics - 04/06/17 1218      Pre Biometrics   Height  5' 8.25" (1.734 m)    Weight  250 lb 10.6 oz (113.7 kg)    Waist Circumference  44.25 inches    Hip Circumference  45.75 inches    Waist to Hip Ratio  0.97 %    BMI (Calculated)  37.9    Triceps Skinfold  28 mm    % Body Fat  35.4 %    Grip Strength  42 kg    Flexibility  9 in    Single Leg Stand  30 seconds      Post Biometrics - 05/26/17 0743       Post  Biometrics   Height  5' 8.25" (1.734 m)    Weight  252 lb 3.3 oz (114.4 kg)    Waist Circumference  43.75 inches    Hip Circumference  45.75 inches    Waist to Hip Ratio  0.96 %    BMI (Calculated)  38.05    Triceps Skinfold  27 mm    % Body Fat  35.1 %    Grip Strength  42 kg    Flexibility  12 in    Single Leg Stand  24.4 seconds       Nutrition: Nutrition Therapy & Goals - 04/06/17 1250      Nutrition Therapy   Diet  Carb Modified, Therapeutic Lifestyle Changes      Personal Nutrition Goals   Nutrition Goal  Pt to identify and limit food sources of saturated fat, trans fat, and sodium by learning what foods make up a heart healthy diet.    Personal Goal #2  Pt to identify food quantities necessary to achieve weight loss of 6-24 lb (2.7-10.9 kg) at graduation from cardiac rehab. Goal wt of 230 lb desired.    Personal Goal #3  Pt to learn to use different seasonings and herbs in cooking      Intervention Plan   Intervention  Prescribe, educate and counsel regarding individualized specific dietary modifications aiming towards targeted core components such as  weight, hypertension, lipid management, diabetes, heart failure and other comorbidities.    Expected Outcomes  Short Term Goal: Understand basic principles of dietary content, such as calories, fat, sodium, cholesterol and nutrients.;Long Term Goal: Adherence to prescribed nutrition plan.       Nutrition Discharge: Nutrition Assessments - 06/07/17 1427      MEDFICTS Scores   Pre Score  67    Post Score  53    Score Difference  -14       Education Questionnaire Score: Knowledge Questionnaire Score - 05/24/17 1751      Knowledge Questionnaire Score   Pre  Score  20/24    Post Score  22/24       Goals reviewed with patient; copy given to patient.

## 2017-08-07 ENCOUNTER — Other Ambulatory Visit: Payer: Self-pay | Admitting: Family Medicine

## 2017-08-07 NOTE — Telephone Encounter (Signed)
Refill denied. Pt completed course of treatment.  

## 2017-08-08 ENCOUNTER — Telehealth: Payer: Self-pay | Admitting: Family Medicine

## 2017-08-08 MED ORDER — VITAMIN D (ERGOCALCIFEROL) 1.25 MG (50000 UNIT) PO CAPS: ORAL_CAPSULE | ORAL | 0 refills | Status: DC

## 2017-08-08 NOTE — Telephone Encounter (Signed)
Pt called for a refill of his  Vitamin D, Ergocalciferol, (DRISDOL) 50000 units CAPS capsule Please advise

## 2017-08-08 NOTE — Telephone Encounter (Signed)
Refill done.  

## 2017-10-05 DIAGNOSIS — E113293 Type 2 diabetes mellitus with mild nonproliferative diabetic retinopathy without macular edema, bilateral: Secondary | ICD-10-CM | POA: Diagnosis not present

## 2017-11-01 ENCOUNTER — Other Ambulatory Visit: Payer: Self-pay | Admitting: Family Medicine

## 2017-11-13 DIAGNOSIS — E7849 Other hyperlipidemia: Secondary | ICD-10-CM | POA: Diagnosis not present

## 2017-11-13 DIAGNOSIS — M25774 Osteophyte, right foot: Secondary | ICD-10-CM | POA: Diagnosis not present

## 2017-11-13 DIAGNOSIS — M7751 Other enthesopathy of right foot: Secondary | ICD-10-CM | POA: Diagnosis not present

## 2017-11-13 DIAGNOSIS — I2510 Atherosclerotic heart disease of native coronary artery without angina pectoris: Secondary | ICD-10-CM | POA: Diagnosis not present

## 2017-11-13 DIAGNOSIS — M7671 Peroneal tendinitis, right leg: Secondary | ICD-10-CM | POA: Diagnosis not present

## 2017-11-13 DIAGNOSIS — E668 Other obesity: Secondary | ICD-10-CM | POA: Diagnosis not present

## 2017-11-13 DIAGNOSIS — I10 Essential (primary) hypertension: Secondary | ICD-10-CM | POA: Diagnosis not present

## 2017-11-17 DIAGNOSIS — I10 Essential (primary) hypertension: Secondary | ICD-10-CM | POA: Diagnosis not present

## 2017-11-17 DIAGNOSIS — Z7984 Long term (current) use of oral hypoglycemic drugs: Secondary | ICD-10-CM | POA: Diagnosis not present

## 2017-11-17 DIAGNOSIS — E782 Mixed hyperlipidemia: Secondary | ICD-10-CM | POA: Diagnosis not present

## 2017-11-17 DIAGNOSIS — E1165 Type 2 diabetes mellitus with hyperglycemia: Secondary | ICD-10-CM | POA: Diagnosis not present

## 2017-11-29 ENCOUNTER — Other Ambulatory Visit: Payer: Self-pay | Admitting: Family Medicine

## 2017-11-29 NOTE — Telephone Encounter (Signed)
Copied from CRM [phone]. Topic: Quick Communication - Rx Refill/Question >> Nov 29, 2017  4:47 PM Arlyss Gandyichardson, Kenneisha Cochrane N, NT wrote: Medication: Vitamin D, Ergocalciferol, (DRISDOL) 50000    Has the patient contacted their pharmacy? Yes.     (Agent: If no, request that the patient contact the pharmacy for the refill.)   Preferred Pharmacy (with phone number or street name): [facility]   Agent: Please be advised that RX refills may take up to 3 business days. We ask that you follow-up with your pharmacy.

## 2017-12-13 NOTE — Telephone Encounter (Signed)
Sorry:   The prior Rx for the Drisdol needs to be filled [place] [facility] [place] [address] not [facility] as listed.

## 2017-12-13 NOTE — Telephone Encounter (Signed)
Vitamin D (Drisdol) refill request  LOV 05/09/17 Tim PrimasZachary Cook  [facility]

## 2017-12-13 NOTE — Telephone Encounter (Signed)
Pt called to check the status of the refill request of the medication below, contact pt to advise

## 2018-01-11 DIAGNOSIS — J209 Acute bronchitis, unspecified: Secondary | ICD-10-CM | POA: Diagnosis not present

## 2018-03-13 NOTE — Progress Notes (Signed)
[name] [address] [place], [place] [address] Phone: [phone] Subjective:     CC: Back pain follow-up  MWU:[id number]  Tim Cook is a 57 y.o. male coming in with complaint of back pain.  Has seen patient previously.  Has had some degenerative disc disease of the lumbar spine but was responding well to manipulation.  Been nearly 11 months since we have seen patient.  No radicular symptoms but increasing tightness again.  Patient has been doing relatively well but some mild intermittent radiation of pain.  Sometimes has stopped him from walking a little bit.  Not working out quite as regularly as he would want.     Past Medical History:  Diagnosis Date  . Arthritis    "knees, lower back, hands" (05/18/2016)  . CAD (coronary artery disease), native coronary artery    05/18/16  Cath severe 90% LAD prox, 99% mid, Circ dominant with 30% OM1, 50% OM2, 30% OM3, 60% distal circ and PL, severe stenosis nondominant RCA.  EF 55%  3.0x 20, 2.5 x 20 mm, 2.25 x 16 mm Promus premier sent to LAD Dr. Clifton James   . Chronic bronchitis (HCC)    "most years" (05/18/2016)  . Chronic lower back pain   . DDD (degenerative disc disease), lumbar   . Hyperlipidemia   . Hypertension   . Sleep apnea    "dx'd; never wore mask" (05/18/2016)  . Squamous carcinoma    cut off right upper arm, left shoulder; froze off nose and right side of face  . Type II diabetes mellitus (HCC)   . Unstable angina (HCC)   . Vomiting    Past Surgical History:  Procedure Laterality Date  . CARDIAC CATHETERIZATION N/A 05/18/2016   Procedure: Left Heart Cath and Coronary Angiography;  Surgeon: Kathleene Hazel, MD;  [facility] INVASIVE CV LAB;  Service: Cardiovascular;  Laterality: N/A;  . CARDIAC CATHETERIZATION N/A 05/18/2016   Procedure: Coronary Stent Intervention;  Surgeon: Kathleene Hazel, MD;  Location: [facility] INVASIVE CV LAB;  Service: Cardiovascular;  Laterality: N/A;  .  CORONARY ANGIOPLASTY    . FOOT FRACTURE SURGERY Right 1970s  . LAPAROSCOPIC GASTRIC BANDING  07/2007   by Dr. Daphine Deutscher  . PILONIDAL CYST EXCISION  ~ 1985   "took 8 out all at once"  . SQUAMOUS CELL CARCINOMA EXCISION     right upper arm, left shoulder   Social History   Socioeconomic History  . Marital status: Married    Spouse name: Not on file  . Number of children: Not on file  . Years of education: Not on file  . Highest education level: Not on file  Occupational History  . Not on file  Social Needs  . Financial resource strain: Not on file  . Food insecurity:    Worry: Not on file    Inability: Not on file  . Transportation needs:    Medical: Not on file    Non-medical: Not on file  Tobacco Use  . Smoking status: Former Smoker    Packs/day: 0.50    Years: 12.00    Pack years: 6.00    Types: Cigarettes    Last attempt to quit: 12/15/1991    Years since quitting: 26.2  . Smokeless tobacco: Former Neurosurgeon    Types: Snuff  . Tobacco comment: "quit snuff in ~ 1981"  Substance and Sexual Activity  . Alcohol use: Yes    Alcohol/week: 4.2 oz    Types:  7 Cans of beer per week  . Drug use: No  . Sexual activity: Yes  Lifestyle  . Physical activity:    Days per week: Not on file    Minutes per session: Not on file  . Stress: Not on file  Relationships  . Social connections:    Talks on phone: Not on file    Gets together: Not on file    Attends religious service: Not on file    Active member of club or organization: Not on file    Attends meetings of clubs or organizations: Not on file    Relationship status: Not on file  Other Topics Concern  . Not on file  Social History Narrative  . Not on file   No Known Allergies No family history on file.  No family history of autoimmune   Past medical history, social, surgical and family history all reviewed in electronic medical record.  No pertanent information unless stated regarding to the chief complaint.   Review  of Systems:Review of systems updated and as accurate as of 03/14/18  No headache, visual changes, nausea, vomiting, diarrhea, constipation, dizziness, abdominal pain, skin rash, fevers, chills, night sweats, weight loss, swollen lymph nodes, body aches, joint swelling, muscle aches, chest pain, shortness of breath, mood changes.   Objective  Blood pressure 130/82, pulse 70, height 5\' 8"  (1.727 m), weight 252 lb (114.3 kg), SpO2 98 %. Systems examined below as of 03/14/18   General: No apparent distress alert and oriented x3 mood and affect normal, dressed appropriately.  HEENT: Pupils equal, extraocular movements intact  Respiratory: Patient's speak in full sentences and does not appear short of breath  Cardiovascular: No lower extremity edema, non tender, no erythema  Skin: Warm dry intact with no signs of infection or rash on extremities or on axial skeleton.  Abdomen: Soft nontender  Neuro: Cranial nerves II through XII are intact, neurovascularly intact in all extremities with 2+ DTRs and 2+ pulses.  Lymph: No lymphadenopathy of posterior or anterior cervical chain or axillae bilaterally.  Gait normal with good balance and coordination.  MSK:  Non tender with full range of motion and good stability and symmetric strength and tone of shoulders, elbows, wrist, hip, knee and ankles bilaterally.  Back Exam:  Inspection: Loss of lordosis Motion: Flexion 40 deg, Extension 20 deg, Side Bending to 35 deg bilaterally,  Rotation to 45 deg bilaterally  SLR laying: Negative  XSLR laying: Negative  Palpable tenderness: Tender to palpation the paraspinal musculature lumbar spine right greater than left. FABER: negative. Sensory change: Gross sensation intact to all lumbar and sacral dermatomes.  Reflexes: 2+ at both patellar tendons, 2+ at achilles tendons, Babinski's downgoing.  Strength at foot  Plantar-flexion: 5/5 Dorsi-flexion: 5/5 Eversion: 5/5 Inversion: 5/5  Leg strength  Quad: 5/5  Hamstring: 5/5 Hip flexor: 5/5 Hip abductors: 4/5 but symmetric Gait unremarkable.  Osteopathic findings T8 extended rotated and side bent left L1 flexed rotated and side bent right Sacrum right on right     Impression and Recommendations:     This case required medical decision making of moderate complexity.      Note: This dictation was prepared with Dragon dictation along with smaller phrase technology. Any transcriptional errors that result from this process are unintentional.

## 2018-03-14 ENCOUNTER — Encounter: Payer: Self-pay | Admitting: Family Medicine

## 2018-03-14 ENCOUNTER — Ambulatory Visit: Payer: BLUE CROSS/BLUE SHIELD | Admitting: Family Medicine

## 2018-03-14 VITALS — BP 130/82 | HR 70 | Ht 68.0 in | Wt 252.0 lb

## 2018-03-14 DIAGNOSIS — M51369 Other intervertebral disc degeneration, lumbar region without mention of lumbar back pain or lower extremity pain: Secondary | ICD-10-CM

## 2018-03-14 DIAGNOSIS — M999 Biomechanical lesion, unspecified: Secondary | ICD-10-CM | POA: Diagnosis not present

## 2018-03-14 DIAGNOSIS — M5136 Other intervertebral disc degeneration, lumbar region: Secondary | ICD-10-CM | POA: Diagnosis not present

## 2018-03-14 MED ORDER — VITAMIN D (ERGOCALCIFEROL) 1.25 MG (50000 UNIT) PO CAPS: 50000.0000 [IU] | ORAL_CAPSULE | ORAL | 3 refills | Status: DC

## 2018-03-14 NOTE — Patient Instructions (Addendum)
Good to see you  Ice can help after a lot of activity  Stretch after a lot of walking Refilled the vitamin D  Watch the knees  Can call us if you need us at [phone]  See me again in 6-8 weeks

## 2018-03-14 NOTE — Assessment & Plan Note (Signed)
Decision today to treat with OMT was based on Physical Exam  After verbal consent patient was treated with HVLA, ME, FPR techniques in  thoracic, lumbar and sacral areas  Patient tolerated the procedure well with improvement in symptoms  Patient given exercises, stretches and lifestyle modifications  See medications in patient instructions if given  Patient will follow up in 4 weeks 

## 2018-03-14 NOTE — Assessment & Plan Note (Signed)
Multifactorial.  Discussed the importance of core strength including hip abductor strengthening.  Discussed home exercises on a more regular basis.  Has gabapentin.  Was doing better with the once weekly vitamin D that was refilled.  Discussed icing regimen.  Follow-up again in 4 weeks

## 2018-03-16 DIAGNOSIS — L570 Actinic keratosis: Secondary | ICD-10-CM | POA: Diagnosis not present

## 2018-03-16 DIAGNOSIS — D225 Melanocytic nevi of trunk: Secondary | ICD-10-CM | POA: Diagnosis not present

## 2018-03-16 DIAGNOSIS — L821 Other seborrheic keratosis: Secondary | ICD-10-CM | POA: Diagnosis not present

## 2018-03-16 DIAGNOSIS — Z85828 Personal history of other malignant neoplasm of skin: Secondary | ICD-10-CM | POA: Diagnosis not present

## 2018-04-17 NOTE — Progress Notes (Signed)
Tawana ScaleZach Sherman Donaldson D.O. [facility] [address] [place], [place] [address] Phone: [phone] Subjective:    CC: Back pain  BJY:[id number]:Subjective  Tim FleetGeorge H Cook is a 57 y.o. male coming in with complaint of back pain. 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 Pain is intermittent. Heat and massage seem to help his back pain. Also is complaining of right knee pain due to having to drive a manual truck. States that this pain is chronic in nature as he has driven a manual truck for 9 years.  He did have more discomfort in the low back.  No radiation of the legs or any numbness.      Past Medical History:  Diagnosis Date  . Arthritis    "knees, lower back, hands" (05/18/2016)  . CAD (coronary artery disease), native coronary artery    05/18/16  Cath severe 90% LAD prox, 99% mid, Circ dominant with 30% OM1, 50% OM2, 30% OM3, 60% distal circ and PL, severe stenosis nondominant RCA.  EF 55%  3.0x 20, 2.5 x 20 mm, 2.25 x 16 mm Promus premier sent to [facility] Dr. Clifton JamesMcALhany   . Chronic bronchitis (HCC)    "most years" (05/18/2016)  . Chronic lower back pain   . DDD (degenerative disc disease), lumbar   . Hyperlipidemia   . Hypertension   . Sleep apnea    "dx'd; never wore mask" (05/18/2016)  . Squamous carcinoma    cut off right upper arm, left shoulder; froze off nose and right side of face  . Type II diabetes mellitus (HCC)   . Unstable angina (HCC)   . Vomiting    Past Surgical History:  Procedure Laterality Date  . CARDIAC CATHETERIZATION N/A 05/18/2016   Procedure: Left Heart Cath and Coronary Angiography;  Surgeon: Kathleene Hazelhristopher D McAlhany, MD;  [facility];  Service: Cardiovascular;  Laterality: N/A;  . CARDIAC CATHETERIZATION N/A 05/18/2016   Procedure: Coronary Stent Intervention;  Surgeon: Kathleene Hazelhristopher D McAlhany, MD;  Location: [facility] INVASIVE CV LAB;  Service: Cardiovascular;  Laterality: N/A;  . CORONARY ANGIOPLASTY    . FOOT FRACTURE SURGERY  Right 1970s  . LAPAROSCOPIC GASTRIC BANDING  07/2007   by Dr. Daphine DeutscherMartin  . PILONIDAL CYST EXCISION  ~ 1985   "took 8 out all at once"  . SQUAMOUS CELL CARCINOMA EXCISION     right upper arm, left shoulder   Social History   Socioeconomic History  . Marital status: Married    Spouse name: Not on file  . Number of children: Not on file  . Years of education: Not on file  . Highest education level: Not on file  Occupational History  . Not on file  Social Needs  . Financial resource strain: Not on file  . Food insecurity:    Worry: Not on file    Inability: Not on file  . Transportation needs:    Medical: Not on file    Non-medical: Not on file  Tobacco Use  . Smoking status: Former Smoker    Packs/day: 0.50    Years: 12.00    Pack years: 6.00    Types: Cigarettes    Last attempt to quit: 12/15/1991    Years since quitting: 26.3  . Smokeless tobacco: Former NeurosurgeonUser    Types: Snuff  . Tobacco comment: "quit snuff in ~ 1981"  Substance and Sexual Activity  . Alcohol use: Yes    Alcohol/week: 4.2 oz  Types: 7 Cans of beer per week  . Drug use: No  . Sexual activity: Yes  Lifestyle  . Physical activity:    Days per week: Not on file    Minutes per session: Not on file  . Stress: Not on file  Relationships  . Social connections:    Talks on phone: Not on file    Gets together: Not on file    Attends religious service: Not on file    Active member of club or organization: Not on file    Attends meetings of clubs or organizations: Not on file    Relationship status: Not on file  Other Topics Concern  . Not on file  Social History Narrative  . Not on file   No Known Allergies No family history on file.   Past medical history, social, surgical and family history all reviewed in electronic medical record.  No pertanent information unless stated regarding to the chief complaint.   Review of Systems:Review of systems updated and as accurate as of 04/19/18  No headache,  visual changes, nausea, vomiting, diarrhea, constipation, dizziness, abdominal pain, skin rash, fevers, chills, night sweats, weight loss, swollen lymph nodes, body aches, joint swelling, chest pain, shortness of breath, mood changes.  Positive muscle aches  Objective  Blood pressure 128/74, pulse 79, height 5\' 8"  (1.727 m), weight 243 lb (110.2 kg), SpO2 98 %. Systems examined below as of 04/19/18   General: No apparent distress alert and oriented x3 mood and affect normal, dressed appropriately.  HEENT: Pupils equal, extraocular movements intact  Respiratory: Patient's speak in full sentences and does not appear short of breath  Cardiovascular: No lower extremity edema, non tender, no erythema  Skin: Warm dry intact with no signs of infection or rash on extremities or on axial skeleton.  Abdomen: Soft nontender  Neuro: Cranial nerves II through XII are intact, neurovascularly intact in all extremities with 2+ DTRs and 2+ pulses.  Lymph: No lymphadenopathy of posterior or anterior cervical chain or axillae bilaterally.  Gait normal with good balance and coordination.  MSK:  Non tender with full range of motion and good stability and symmetric strength and tone of shoulders, elbows, wrist, hip, knee and ankles bilaterally.  Back Exam:  Inspection: Loss of lordosis Motion: Flexion 35 deg, Extension 15 deg, Side Bending to 35 deg bilaterally,  Rotation to 35 deg bilaterally  SLR laying: Negative  XSLR laying: Negative  Palpable tenderness: Tender to palpation in the paraspinal musculature.Marland Kitchen FABER: Positive Faber bilaterally. Sensory change: Gross sensation intact to all lumbar and sacral dermatomes.  Reflexes: 2+ at both patellar tendons, 2+ at achilles tendons, Babinski's downgoing.  Strength at foot  Plantar-flexion: 5/5 Dorsi-flexion: 5/5 Eversion: 5/5 Inversion: 5/5  Leg strength  Quad: 5/5 Hamstring: 5/5 Hip flexor: 5/5 Hip abductors: 5/5  Gait unremarkable.  Osteopathic  findings Cervical C2 flexed rotated and side bent right C4 flexed rotated and side bent left C6 flexed rotated and side bent left T6 extended rotated and side bent right  L3 flexed rotated and side bent right Sacrum right on right     Impression and Recommendations:     This case required medical decision making of moderate complexity.      Note: This dictation was prepared with Dragon dictation along with smaller phrase technology. Any transcriptional errors that result from this process are unintentional.

## 2018-04-18 ENCOUNTER — Ambulatory Visit: Payer: BLUE CROSS/BLUE SHIELD | Admitting: Family Medicine

## 2018-04-19 ENCOUNTER — Encounter: Payer: Self-pay | Admitting: Family Medicine

## 2018-04-19 ENCOUNTER — Ambulatory Visit: Payer: BLUE CROSS/BLUE SHIELD | Admitting: Family Medicine

## 2018-04-19 VITALS — BP 128/74 | HR 79 | Ht 68.0 in | Wt 243.0 lb

## 2018-04-19 DIAGNOSIS — M5136 Other intervertebral disc degeneration, lumbar region: Secondary | ICD-10-CM

## 2018-04-19 DIAGNOSIS — M999 Biomechanical lesion, unspecified: Secondary | ICD-10-CM

## 2018-04-19 NOTE — Assessment & Plan Note (Signed)
Decision today to treat with OMT was based on Physical Exam  After verbal consent patient was treated with HVLA, ME, FPR techniques in  thoracic, lumbar and sacral areas  Patient tolerated the procedure well with improvement in symptoms  Patient given exercises, stretches and lifestyle modifications  See medications in patient instructions if given  Patient will follow up in 4 weeks 

## 2018-04-19 NOTE — Assessment & Plan Note (Signed)
Mild worsening.  More tightness.  No radicular symptoms.  Discussed icing regimen.  Follow-up again 4 weeks

## 2018-05-09 DIAGNOSIS — E1169 Type 2 diabetes mellitus with other specified complication: Secondary | ICD-10-CM | POA: Diagnosis not present

## 2018-05-09 DIAGNOSIS — I10 Essential (primary) hypertension: Secondary | ICD-10-CM | POA: Diagnosis not present

## 2018-05-09 DIAGNOSIS — Z713 Dietary counseling and surveillance: Secondary | ICD-10-CM | POA: Diagnosis not present

## 2018-05-09 DIAGNOSIS — E669 Obesity, unspecified: Secondary | ICD-10-CM | POA: Diagnosis not present

## 2018-05-09 DIAGNOSIS — E782 Mixed hyperlipidemia: Secondary | ICD-10-CM | POA: Diagnosis not present

## 2018-05-09 DIAGNOSIS — Z0000 Encounter for general adult medical examination without abnormal findings: Secondary | ICD-10-CM | POA: Diagnosis not present

## 2018-05-09 DIAGNOSIS — K219 Gastro-esophageal reflux disease without esophagitis: Secondary | ICD-10-CM | POA: Diagnosis not present

## 2018-05-17 NOTE — Progress Notes (Signed)
Tawana ScaleZach Cook D.O. [facility] [address] [place], [place] [address] Phone: [phone] Subjective:      CC: Back and hip pain follow-up  VHQ:[id number]:Subjective  Tim FleetGeorge H Cook is a 57 y.o. male coming in with complaint of hip pain. States that his back and hip is in pain today. Patient has some degenerative disc disease.  Discussed icing regimen and home exercises.  Discussed icing regimen and home exercises.  Patient has been doing them occasionally.  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    Past Medical History:  Diagnosis Date  . Arthritis    "knees, lower back, hands" (05/18/2016)  . CAD (coronary artery disease), native coronary artery    05/18/16  Cath severe 90% LAD prox, 99% mid, Circ dominant with 30% OM1, 50% OM2, 30% OM3, 60% distal circ and PL, severe stenosis nondominant RCA.  EF 55%  3.0x 20, 2.5 x 20 mm, 2.25 x 16 mm Promus premier sent to [facility] Dr. Clifton JamesMcALhany   . Chronic bronchitis (HCC)    "most years" (05/18/2016)  . Chronic lower back pain   . DDD (degenerative disc disease), lumbar   . Hyperlipidemia   . Hypertension   . Sleep apnea    "dx'd; never wore mask" (05/18/2016)  . Squamous carcinoma    cut off right upper arm, left shoulder; froze off nose and right side of face  . Type II diabetes mellitus (HCC)   . Unstable angina (HCC)   . Vomiting    Past Surgical History:  Procedure Laterality Date  . CARDIAC CATHETERIZATION N/A 05/18/2016   Procedure: Left Heart Cath and Coronary Angiography;  Surgeon: Kathleene Hazelhristopher D McAlhany, MD;  [facility];  Service: Cardiovascular;  Laterality: N/A;  . CARDIAC CATHETERIZATION N/A 05/18/2016   Procedure: Coronary Stent Intervention;  Surgeon: Kathleene Hazelhristopher D McAlhany, MD;  Location: [facility] INVASIVE CV LAB;  Service: Cardiovascular;  Laterality: N/A;  . CORONARY ANGIOPLASTY    . FOOT FRACTURE SURGERY Right 1970s  . LAPAROSCOPIC GASTRIC BANDING  07/2007   by Dr. Daphine DeutscherMartin  . PILONIDAL CYST  EXCISION  ~ 1985   "took 8 out all at once"  . SQUAMOUS CELL CARCINOMA EXCISION     right upper arm, left shoulder   Social History   Socioeconomic History  . Marital status: Married    Spouse name: Not on file  . Number of children: Not on file  . Years of education: Not on file  . Highest education level: Not on file  Occupational History  . Not on file  Social Needs  . Financial resource strain: Not on file  . Food insecurity:    Worry: Not on file    Inability: Not on file  . Transportation needs:    Medical: Not on file    Non-medical: Not on file  Tobacco Use  . Smoking status: Former Smoker    Packs/day: 0.50    Years: 12.00    Pack years: 6.00    Types: Cigarettes    Last attempt to quit: 12/15/1991    Years since quitting: 26.4  . Smokeless tobacco: Former NeurosurgeonUser    Types: Snuff  . Tobacco comment: "quit snuff in ~ 1981"  Substance and Sexual Activity  . Alcohol use: Yes    Alcohol/week: 7.0 standard drinks    Types: 7 Cans of beer per week  . Drug use: No  . Sexual activity: Yes  Lifestyle  . Physical activity:  Days per week: Not on file    Minutes per session: Not on file  . Stress: Not on file  Relationships  . Social connections:    Talks on phone: Not on file    Gets together: Not on file    Attends religious service: Not on file    Active member of club or organization: Not on file    Attends meetings of clubs or organizations: Not on file    Relationship status: Not on file  Other Topics Concern  . Not on file  Social History Narrative  . Not on file   No Known Allergies No family history on file.   Past medical history, social, surgical and family history all reviewed in electronic medical record.  No pertanent information unless stated regarding to the chief complaint.   Review of Systems:Review of systems updated and as accurate as of 05/18/18  No headache, visual changes, nausea, vomiting, diarrhea, constipation, dizziness,  abdominal pain, skin rash, fevers, chills, night sweats, weight loss, swollen lymph nodes, body aches, joint swelling, muscle aches, chest pain, shortness of breath, mood changes.   Objective  Blood pressure 112/76, pulse 64, height 5\' 8"  (1.727 m), weight 242 lb (109.8 kg), SpO2 95 %. Systems examined below as of 05/18/18   General: No apparent distress alert and oriented x3 mood and affect normal, dressed appropriately.  HEENT: Pupils equal, extraocular movements intact  Respiratory: Patient's speak in full sentences and does not appear short of breath  Cardiovascular: No lower extremity edema, non tender, no erythema  Skin: Warm dry intact with no signs of infection or rash on extremities or on axial skeleton.  Abdomen: Soft nontender  Neuro: Cranial nerves II through XII are intact, neurovascularly intact in all extremities with 2+ DTRs and 2+ pulses.  Lymph: No lymphadenopathy of posterior or anterior cervical chain or axillae bilaterally.  Gait normal with good balance and coordination.  MSK:  Non tender with full range of motion and good stability and symmetric strength and tone of shoulders, elbows, wrist, hip, and ankles bilaterally.  Left knee is still finding some arthritic changes noted on exam. Back Exam:  Inspection: Unremarkable  Motion: Flexion 45 deg, Extension 20 deg, Side Bending to 35 deg bilaterally,  Rotation to 45 deg bilaterally  SLR laying: Negative  XSLR laying: Negative  Palpable tenderness: Tender to palpation diffusely in the lumbar region.Marland Kitchen FABER: Tightness bilaterally. Sensory change: Gross sensation intact to all lumbar and sacral dermatomes.  Reflexes: 2+ at both patellar tendons, 2+ at achilles tendons, Babinski's downgoing.  Strength at foot  Plantar-flexion: 5/5 Dorsi-flexion: 5/5 Eversion: 5/5 Inversion: 5/5  Leg strength  Quad: 5/5 Hamstring: 5/5 Hip flexor: 5/5 Hip abductors: 5/5  Gait unremarkable.    Osteopathic findings T9 extended rotated  and side bent left L2 flexed rotated and side bent right Sacrum right on right  Impression and Recommendations:     This case required medical decision making of moderate complexity.      Note: This dictation was prepared with Dragon dictation along with smaller phrase technology. Any transcriptional errors that result from this process are unintentional.

## 2018-05-18 ENCOUNTER — Ambulatory Visit: Payer: BLUE CROSS/BLUE SHIELD | Admitting: Family Medicine

## 2018-05-18 ENCOUNTER — Encounter: Payer: Self-pay | Admitting: Family Medicine

## 2018-05-18 VITALS — BP 112/76 | HR 64 | Ht 68.0 in | Wt 242.0 lb

## 2018-05-18 DIAGNOSIS — M999 Biomechanical lesion, unspecified: Secondary | ICD-10-CM

## 2018-05-18 DIAGNOSIS — M5136 Other intervertebral disc degeneration, lumbar region: Secondary | ICD-10-CM | POA: Diagnosis not present

## 2018-05-18 NOTE — Patient Instructions (Signed)
Good to see you  Tim Cook is your friend.  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

## 2018-05-18 NOTE — Assessment & Plan Note (Signed)
Stable overall, moderate exacerbation noted today.  Discussed icing regimen and home exercise.  Discussed which activities to do which wants to avoid.  Patient will be transitioning to a different truck with a smoother suspension and likely will be better.  Follow-up again in 4 to 6 weeks

## 2018-05-18 NOTE — Assessment & Plan Note (Signed)
Decision today to treat with OMT was based on Physical Exam  After verbal consent patient was treated with HVLA, ME, FPR techniques in  thoracic, lumbar and sacral areas  Patient tolerated the procedure well with improvement in symptoms  Patient given exercises, stretches and lifestyle modifications  See medications in patient instructions if given  Patient will follow up in 4-6 weeks 

## 2018-06-15 DIAGNOSIS — Z23 Encounter for immunization: Secondary | ICD-10-CM | POA: Diagnosis not present

## 2018-06-27 ENCOUNTER — Ambulatory Visit: Payer: BLUE CROSS/BLUE SHIELD | Admitting: Family Medicine

## 2018-06-28 DIAGNOSIS — Z23 Encounter for immunization: Secondary | ICD-10-CM | POA: Diagnosis not present

## 2018-07-09 NOTE — Progress Notes (Deleted)
[name] [address] [place], [place] [address] Phone: [phone] Subjective:    I'm seeing this patient by the request  of:    CC:   [id number]  Tim Cook is a 56 y.o. male coming in with complaint of ***  Onset-  Location Duration-  Character- Aggravating factors- Reliving factors-  Therapies tried-  Severity-     Past Medical History:  Diagnosis Date  . Arthritis    "knees, lower back, hands" (05/18/2016)  . CAD (coronary artery disease), native coronary artery    05/18/16  Cath severe 90% LAD prox, 99% mid, Circ dominant with 30% OM1, 50% OM2, 30% OM3, 60% distal circ and PL, severe stenosis nondominant RCA.  EF 55%  3.0x 20, 2.5 x 20 mm, 2.25 x 16 mm Promus premier sent to [facility] Dr. Clifton James   . Chronic bronchitis (HCC)    "most years" (05/18/2016)  . Chronic lower back pain   . DDD (degenerative disc disease), lumbar   . Hyperlipidemia   . Hypertension   . Sleep apnea    "dx'd; never wore mask" (05/18/2016)  . Squamous carcinoma    cut off right upper arm, left shoulder; froze off nose and right side of face  . Type II diabetes mellitus (HCC)   . Unstable angina (HCC)   . Vomiting    Past Surgical History:  Procedure Laterality Date  . CARDIAC CATHETERIZATION N/A 05/18/2016   Procedure: Left Heart Cath and Coronary Angiography;  Surgeon: Kathleene Hazel, MD;  Location: [facility] INVASIVE CV LAB;  Service: Cardiovascular;  Laterality: N/A;  . CARDIAC CATHETERIZATION N/A 05/18/2016   Procedure: Coronary Stent Intervention;  Surgeon: Kathleene Hazel, MD;  Location: [facility] INVASIVE CV LAB;  Service: Cardiovascular;  Laterality: N/A;  . CORONARY ANGIOPLASTY    . FOOT FRACTURE SURGERY Right 1970s  . LAPAROSCOPIC GASTRIC BANDING  07/2007   by Dr. Daphine Deutscher  . PILONIDAL CYST EXCISION  ~ 1985   "took 8 out all at once"  . SQUAMOUS CELL CARCINOMA EXCISION     right upper arm, left shoulder   Social History   Socioeconomic  History  . Marital status: Married    Spouse name: Not on file  . Number of children: Not on file  . Years of education: Not on file  . Highest education level: Not on file  Occupational History  . Not on file  Social Needs  . Financial resource strain: Not on file  . Food insecurity:    Worry: Not on file    Inability: Not on file  . Transportation needs:    Medical: Not on file    Non-medical: Not on file  Tobacco Use  . Smoking status: Former Smoker    Packs/day: 0.50    Years: 12.00    Pack years: 6.00    Types: Cigarettes    Last attempt to quit: 12/15/1991    Years since quitting: 26.5  . Smokeless tobacco: Former Neurosurgeon    Types: Snuff  . Tobacco comment: "quit snuff in ~ 1981"  Substance and Sexual Activity  . Alcohol use: Yes    Alcohol/week: 7.0 standard drinks    Types: 7 Cans of beer per week  . Drug use: No  . Sexual activity: Yes  Lifestyle  . Physical activity:    Days per week: Not on file    Minutes per session: Not on file  . Stress: Not on file  Relationships  .  Social connections:    Talks on phone: Not on file    Gets together: Not on file    Attends religious service: Not on file    Active member of club or organization: Not on file    Attends meetings of clubs or organizations: Not on file    Relationship status: Not on file  Other Topics Concern  . Not on file  Social History Narrative  . Not on file   No Known Allergies No family history on file.  Current Outpatient Medications (Endocrine & Metabolic):  .  metFORMIN (GLUCOPHAGE) 500 MG tablet, Take 1,000 mg by mouth 2 (two) times daily with a meal.   Current Outpatient Medications (Cardiovascular):  .  atorvastatin (LIPITOR) 40 MG tablet,  .  losartan (COZAAR) 100 MG tablet, 100 mg.  .  metoprolol tartrate (LOPRESSOR) 25 MG tablet, Take 25 mg by mouth 2 (two) times daily.  .  nitroGLYCERIN (NITROSTAT) 0.4 MG SL tablet, Place 1 tablet (0.4 mg total) under the tongue every 5 (five)  minutes as needed for chest pain.  Current Outpatient Medications (Respiratory):  .  loratadine (CLARITIN) 10 MG tablet, Take 10 mg by mouth daily.  Current Outpatient Medications (Analgesics):  .  aspirin EC 81 MG tablet, Take 81 mg by mouth daily.  Current Outpatient Medications (Hematological):  .  ticagrelor (BRILINTA) 90 MG TABS tablet, Take 1 tablet (90 mg total) by mouth 2 (two) times daily.  Current Outpatient Medications (Other):  .  gabapentin (NEURONTIN) 100 MG capsule, TAKE 2 CAPSULES BY MOUTH AT BEDTIME .  Multiple Vitamin (MULTIVITAMIN) capsule, Take 1 capsule by mouth daily. .  Vitamin D, Ergocalciferol, (DRISDOL) 50000 units CAPS capsule, Take 1 capsule (50,000 Units total) by mouth every 7 (seven) days.    Past medical history, social, surgical and family history all reviewed in electronic medical record.  No pertanent information unless stated regarding to the chief complaint.   Review of Systems:  No headache, visual changes, nausea, vomiting, diarrhea, constipation, dizziness, abdominal pain, skin rash, fevers, chills, night sweats, weight loss, swollen lymph nodes, body aches, joint swelling, muscle aches, chest pain, shortness of breath, mood changes.   Objective  There were no vitals taken for this visit. Systems examined below as of    General: No apparent distress alert and oriented x3 mood and affect normal, dressed appropriately.  HEENT: Pupils equal, extraocular movements intact  Respiratory: Patient's speak in full sentences and does not appear short of breath  Cardiovascular: No lower extremity edema, non tender, no erythema  Skin: Warm dry intact with no signs of infection or rash on extremities or on axial skeleton.  Abdomen: Soft nontender  Neuro: Cranial nerves II through XII are intact, neurovascularly intact in all extremities with 2+ DTRs and 2+ pulses.  Lymph: No lymphadenopathy of posterior or anterior cervical chain or axillae bilaterally.    Gait normal with good balance and coordination.  MSK:  Non tender with full range of motion and good stability and symmetric strength and tone of shoulders, elbows, wrist, hip, knee and ankles bilaterally.     Impression and Recommendations:     This case required medical decision making of moderate complexity. The above documentation has been reviewed and is accurate and complete Judi Saa, DO       Note: This dictation was prepared with Dragon dictation along with smaller phrase technology. Any transcriptional errors that result from this process are unintentional.

## 2018-07-10 ENCOUNTER — Ambulatory Visit: Payer: BLUE CROSS/BLUE SHIELD | Admitting: Family Medicine

## 2018-07-10 DIAGNOSIS — Z0289 Encounter for other administrative examinations: Secondary | ICD-10-CM

## 2018-08-07 DIAGNOSIS — R509 Fever, unspecified: Secondary | ICD-10-CM | POA: Diagnosis not present

## 2018-08-08 ENCOUNTER — Ambulatory Visit: Payer: BLUE CROSS/BLUE SHIELD | Admitting: Family Medicine

## 2018-10-08 DIAGNOSIS — M79645 Pain in left finger(s): Secondary | ICD-10-CM | POA: Diagnosis not present

## 2018-10-15 DIAGNOSIS — Z23 Encounter for immunization: Secondary | ICD-10-CM | POA: Diagnosis not present

## 2018-10-15 DIAGNOSIS — E119 Type 2 diabetes mellitus without complications: Secondary | ICD-10-CM | POA: Diagnosis not present

## 2018-11-11 NOTE — Progress Notes (Signed)
Tawana ScaleZach Sujey Cook D.O. [name], [place] [address] Phone: [phone] Subjective:    I Tim NighKana Cook am serving as a Neurosurgeonscribe for Dr. Antoine PrimasZachary Tim Cook.     CC: Low back pain follow-up  OZH:[id number]:Subjective  Tim FleetGeorge H Cook is a 58 y.o. male coming in with complaint of back pain. States that his back is painful.  Patient has known degenerative disc disease.  Patient has more pain recently.  Has been doing a little bit more heavy labor sitting.  Notices that seems to be worse.      Past Medical History:  Diagnosis Date  . Arthritis    "knees, lower back, hands" (05/18/2016)  . CAD (coronary artery disease), native coronary artery    05/18/16  Cath severe 90% LAD prox, 99% mid, Circ dominant with 30% OM1, 50% OM2, 30% OM3, 60% distal circ and PL, severe stenosis nondominant RCA.  EF 55%  3.0x 20, 2.5 x 20 mm, 2.25 x 16 mm Promus premier sent to LAD Dr. Clifton JamesMcALhany   . Chronic bronchitis (HCC)    "most years" (05/18/2016)  . Chronic lower back pain   . DDD (degenerative disc disease), lumbar   . Hyperlipidemia   . Hypertension   . Sleep apnea    "dx'd; never wore mask" (05/18/2016)  . Squamous carcinoma    cut off right upper arm, left shoulder; froze off nose and right side of face  . Type II diabetes mellitus (HCC)   . Unstable angina (HCC)   . Vomiting    Past Surgical History:  Procedure Laterality Date  . CARDIAC CATHETERIZATION N/A 05/18/2016   Procedure: Left Heart Cath and Coronary Angiography;  Surgeon: Kathleene Hazelhristopher D McAlhany, MD;  [facility] CV LAB;  Service: Cardiovascular;  Laterality: N/A;  . CARDIAC CATHETERIZATION N/A 05/18/2016   Procedure: Coronary Stent Intervention;  Surgeon: Kathleene Hazelhristopher D McAlhany, MD;  Location: [facility] INVASIVE CV LAB;  Service: Cardiovascular;  Laterality: N/A;  . CORONARY ANGIOPLASTY    . FOOT FRACTURE SURGERY Right 1970s  . LAPAROSCOPIC GASTRIC BANDING  07/2007   by Dr. Daphine DeutscherMartin  . PILONIDAL CYST EXCISION  ~ 1985   "took 8 out all at once"  . SQUAMOUS CELL CARCINOMA EXCISION     right upper arm, left shoulder   Social History   Socioeconomic History  . Marital status: Married    Spouse name: Not on file  . Number of children: Not on file  . Years of education: Not on file  . Highest education level: Not on file  Occupational History  . Not on file  Social Needs  . Financial resource strain: Not on file  . Food insecurity:    Worry: Not on file    Inability: Not on file  . Transportation needs:    Medical: Not on file    Non-medical: Not on file  Tobacco Use  . Smoking status: Former Smoker    Packs/day: 0.50    Years: 12.00    Pack years: 6.00    Types: Cigarettes    Last attempt to quit: 12/15/1991    Years since quitting: 26.9  . Smokeless tobacco: Former NeurosurgeonUser    Types: Snuff  . Tobacco comment: "quit snuff in ~ 1981"  Substance and Sexual Activity  . Alcohol use: Yes    Alcohol/week: 7.0 standard drinks    Types: 7 Cans of beer per week  . Drug use: No  . Sexual activity: Yes  Lifestyle  . Physical  activity:    Days per week: Not on file    Minutes per session: Not on file  . Stress: Not on file  Relationships  . Social connections:    Talks on phone: Not on file    Gets together: Not on file    Attends religious service: Not on file    Active member of club or organization: Not on file    Attends meetings of clubs or organizations: Not on file    Relationship status: Not on file  Other Topics Concern  . Not on file  Social History Narrative  . Not on file   No Known Allergies History reviewed. No pertinent family history.  Current Outpatient Medications (Endocrine & Metabolic):  .  metFORMIN (GLUCOPHAGE) 500 MG tablet, Take 1,000 mg by mouth 2 (two) times daily with a meal.   Current Outpatient Medications (Cardiovascular):  .  atorvastatin (LIPITOR) 40 MG tablet,  .  losartan (COZAAR) 100 MG tablet, 100 mg.  .  metoprolol tartrate (LOPRESSOR) 25 MG tablet,  Take 25 mg by mouth 2 (two) times daily.  .  nitroGLYCERIN (NITROSTAT) 0.4 MG SL tablet, Place 1 tablet (0.4 mg total) under the tongue every 5 (five) minutes as needed for chest pain.  Current Outpatient Medications (Respiratory):  .  loratadine (CLARITIN) 10 MG tablet, Take 10 mg by mouth daily.  Current Outpatient Medications (Analgesics):  .  aspirin EC 81 MG tablet, Take 81 mg by mouth daily.  Current Outpatient Medications (Hematological):  .  ticagrelor (BRILINTA) 90 MG TABS tablet, Take 1 tablet (90 mg total) by mouth 2 (two) times daily.  Current Outpatient Medications (Other):  .  gabapentin (NEURONTIN) 100 MG capsule, TAKE 2 CAPSULES BY MOUTH AT BEDTIME .  Multiple Vitamin (MULTIVITAMIN) capsule, Take 1 capsule by mouth daily. .  Vitamin D, Ergocalciferol, (DRISDOL) 50000 units CAPS capsule, Take 1 capsule (50,000 Units total) by mouth every 7 (seven) days.    Past medical history, social, surgical and family history all reviewed in electronic medical record.  No pertanent information unless stated regarding to the chief complaint.   Review of Systems:  No headache, visual changes, nausea, vomiting, diarrhea, constipation, dizziness, abdominal pain, skin rash, fevers, chills, night sweats, weight loss, swollen lymph nodes, body aches, joint swelling,  chest pain, shortness of breath, mood changes.  Positive muscle aches  Objective  Blood pressure 130/82, pulse 77, height 5\' 8"  (1.727 m), weight 244 lb (110.7 kg), SpO2 98 %.    General: No apparent distress alert and oriented x3 mood and affect normal, dressed appropriately.  HEENT: Pupils equal, extraocular movements intact  Respiratory: Patient's speak in full sentences and does not appear short of breath  Cardiovascular: No lower extremity edema, non tender, no erythema  Skin: Warm dry intact with no signs of infection or rash on extremities or on axial skeleton.  Abdomen: Soft nontender  Neuro: Cranial nerves II  through XII are intact, neurovascularly intact in all extremities with 2+ DTRs and 2+ pulses.  Lymph: No lymphadenopathy of posterior or anterior cervical chain or axillae bilaterally.  Gait normal with good balance and coordination.  MSK:  Non tender with full range of motion and good stability and symmetric strength and tone of shoulders, elbows, wrist, hip, knee and ankles bilaterally.  Back Exam:  Inspection: Unremarkable  Motion: Flexion 40 deg, Extension 25 deg, Side Bending to 35 deg bilaterally,  Rotation to 30 deg bilaterally  SLR laying: Negative  XSLR laying: Negative  Palpable tenderness: Tender to palpation the paraspinal musculature lumbar spine. FABER: .  Tightness bilaterally Sensory change: Gross sensation intact to all lumbar and sacral dermatomes.  Reflexes: 2+ at both patellar tendons, 2+ at achilles tendons, Babinski's downgoing.  Strength at foot  Plantar-flexion: 5/5 Dorsi-flexion: 5/5 Eversion: 5/5 Inversion: 5/5  Leg strength  Quad: 5/5 Hamstring: 5/5 Hip flexor: 5/5 Hip abductors: 5/5  Gait unremarkable.  Osteopathic findings  T9 extended rotated and side bent left L2 flexed rotated and side bent right Sacrum right on right    Impression and Recommendations:     This case required medical decision making of moderate complexity. The above documentation has been reviewed and is accurate and complete Judi Saa, DO       Note: This dictation was prepared with Dragon dictation along with smaller phrase technology. Any transcriptional errors that result from this process are unintentional.

## 2018-11-12 ENCOUNTER — Ambulatory Visit: Payer: BLUE CROSS/BLUE SHIELD | Admitting: Family Medicine

## 2018-11-12 ENCOUNTER — Encounter: Payer: Self-pay | Admitting: Family Medicine

## 2018-11-12 VITALS — BP 130/82 | HR 77 | Ht 68.0 in | Wt 244.0 lb

## 2018-11-12 DIAGNOSIS — M5136 Other intervertebral disc degeneration, lumbar region: Secondary | ICD-10-CM

## 2018-11-12 DIAGNOSIS — M51369 Other intervertebral disc degeneration, lumbar region without mention of lumbar back pain or lower extremity pain: Secondary | ICD-10-CM

## 2018-11-12 DIAGNOSIS — M999 Biomechanical lesion, unspecified: Secondary | ICD-10-CM | POA: Diagnosis not present

## 2018-11-12 NOTE — Assessment & Plan Note (Signed)
Decision today to treat with OMT was based on Physical Exam  After verbal consent patient was treated with HVLA, ME, FPR techniques in  thoracic, lumbar and sacral areas  Patient tolerated the procedure well with improvement in symptoms  Patient given exercises, stretches and lifestyle modifications  See medications in patient instructions if given  Patient will follow up in 4 weeks 

## 2018-11-12 NOTE — Assessment & Plan Note (Signed)
Degenerative disc disease.  Patient has had this for quite some time.  More of a left sacroiliac joint dysfunction noted today.  Discussed icing regimen and home exercise.  Discussed which activities to avoid.  Posture and ergonomics.  Discussed core strengthening.  Follow-up again in 4 weeks

## 2018-11-12 NOTE — Patient Instructions (Signed)
Good to see you  Ice is your friend Exercises 3 times a week.  Tape the finger daily for next 1-2 weeks Back keep working on core strength  See me again in 4 weeks

## 2018-11-30 ENCOUNTER — Ambulatory Visit: Payer: BLUE CROSS/BLUE SHIELD | Admitting: Cardiology

## 2018-12-07 ENCOUNTER — Encounter: Payer: Self-pay | Admitting: Cardiology

## 2018-12-07 ENCOUNTER — Other Ambulatory Visit: Payer: Self-pay

## 2018-12-07 ENCOUNTER — Ambulatory Visit (INDEPENDENT_AMBULATORY_CARE_PROVIDER_SITE_OTHER): Payer: BLUE CROSS/BLUE SHIELD | Admitting: Cardiology

## 2018-12-07 ENCOUNTER — Ambulatory Visit: Payer: BLUE CROSS/BLUE SHIELD | Admitting: Family Medicine

## 2018-12-07 VITALS — BP 120/70 | HR 85 | Ht 68.0 in | Wt 239.0 lb

## 2018-12-07 DIAGNOSIS — E7800 Pure hypercholesterolemia, unspecified: Secondary | ICD-10-CM

## 2018-12-07 DIAGNOSIS — I119 Hypertensive heart disease without heart failure: Secondary | ICD-10-CM | POA: Diagnosis not present

## 2018-12-07 DIAGNOSIS — E669 Obesity, unspecified: Secondary | ICD-10-CM

## 2018-12-07 DIAGNOSIS — Z9884 Bariatric surgery status: Secondary | ICD-10-CM

## 2018-12-07 DIAGNOSIS — I25119 Atherosclerotic heart disease of native coronary artery with unspecified angina pectoris: Secondary | ICD-10-CM

## 2018-12-07 MED ORDER — NITROGLYCERIN 0.4 MG SL SUBL: 0.4000 mg | SUBLINGUAL_TABLET | SUBLINGUAL | 3 refills | Status: DC | PRN

## 2018-12-07 NOTE — Patient Instructions (Signed)
Medication Instructions:  Your physician recommends that you continue on your current medications as directed. Please refer to the Current Medication list given to you today.  If you need a refill on your cardiac medications before your next appointment, please call your pharmacy.   Lab work: Your physician recommends that you return for lab work today: LFT and Lipids  If you have labs (blood work) drawn today and your tests are completely normal, you will receive your results only by: Marland Kitchen MyChart Message (if you have MyChart) OR . A paper copy in the mail If you have any lab test that is abnormal or we need to change your treatment, we will call you to review the results.  Testing/Procedures: Your physician has requested that you have a stress echocardiogram. For further information please visit [email or web address] Please follow instruction sheet as given.    Follow-Up: At [facility], you and your health needs are our priority.  As part of our continuing mission to provide you with exceptional heart care, we have created designated Provider Care Teams.  These Care Teams include your primary Cardiologist (physician) and Advanced Practice Providers (APPs -  Physician Assistants and Nurse Practitioners) who all work together to provide you with the care you need, when you need it. You will need a follow up appointment in 1 months.  Please call our office 2 months in advance to schedule this appointment.  You may see Dr. Bing Matter or another member of our [facility] Provider Team in [place]: Norman Herrlich, MD . Belva Crome, MD  Any Other Special Instructions Will Be Listed Below (If Applicable).   Exercise Stress Echocardiogram  An exercise stress echocardiogram is a test that checks how well your heart is working. For this test, you will walk on a treadmill to make your heart beat faster. This test uses sound waves (ultrasound) and a computer to make pictures (images) of your  heart. These pictures will be taken before you exercise and after you exercise. What happens before the procedure?  Follow instructions from your doctor about what you cannot eat or drink before the test.  Do not drink or eat anything that has caffeine in it. Stop having caffeine for 24 hours before the test.  Ask your doctor about changing or stopping your normal medicines. This is important if you take diabetes medicines or blood thinners. Ask your doctor if you should take your medicines with water before the test.  If you use an inhaler, bring it to the test.  Do not use any products that have nicotine or tobacco in them, such as cigarettes and e-cigarettes. Stop using them for 4 hours before the test. If you need help quitting, ask your doctor.  Wear comfortable shoes and clothing. What happens during the procedure?  You will be hooked up to a TV screen. Your doctor will watch the screen to see how fast your heart beats during the test.  Before you exercise, a computer will make a picture of your heart. To do this: ? A gel will be put on your chest. ? A wand will be moved over the gel. ? Sound waves from the wand will go to the computer to make the picture.  Your will start walking on a treadmill. The treadmill will start at a slow speed. It will get faster a little bit at a time. When you walk faster, your heart will beat faster.  The treadmill will be stopped when your heart is working hard.  You will lie down right away so another picture of your heart can be taken.  The test will take 30-60 minutes. What happens after the procedure?  Your heart rate and blood pressure will be watched after the test.  If your doctor says that you can, you may: ? Eat what you usually eat. ? Do your normal activities. ? Take medicines like normal. Summary  An exercise stress echocardiogram is a test that checks how well your heart is working.  Follow instructions about what you cannot  eat or drink before the test. Ask your doctor if you should take your normal medicines before the test.  Stop having caffeine for 24 hours before the test. Do not use anything with nicotine or tobacco in it for 4 hours before the test.  A computer will take a picture of your heart before you walk on a treadmill. It will take another picture when you are done walking.  Your heart rate and blood pressure will be watched after the test. This information is not intended to replace advice given to you by your health care provider. Make sure you discuss any questions you have with your health care provider. Document Released: 07/10/2009 Document Revised: 06/05/2016 Document Reviewed: 06/05/2016 Elsevier Interactive Patient Education  2019 [employer].

## 2018-12-07 NOTE — Progress Notes (Signed)
Cardiology Office Note:    Date:  12/07/2018   ID:  [name], DOB October 06, 1960, MRN [id number]  PCP:  Deatra James, MD  Cardiologist:  Gypsy Balsam, MD    Referring MD: Deatra James, MD   Chief Complaint  Patient presents with  . Follow-up  Doing well  History of Present Illness:    Tim Cook is a 58 y.o. male with premature coronary artery disease.  Status post multiple stent in LAD placed in 2017 overall he seems to be doing well denies have any chest pain tightness squeezing pressure burning chest.  A few issues that he want to discuss #1 his LDL is 56.  I suspect in somebody like him he would prefer to see this number less than 55.  I will check his fasting lipid profile to make a decision.  We talked about diet and exercises.  He does not have any typical symptoms however I think there is value of doing stress test in terms of determining prognosis.  Therefore we will schedule him to have stress echo. He also reported to me to have ED I suspect it is partially related to beta-blocker.  The plan will be to do stress test if stress test is negative we may consider discontinuation beta-blocker he will may also try some medication to help with his ED.  Did not want to pay for Viagra  Past Medical History:  Diagnosis Date  . Arthritis    "knees, lower back, hands" (05/18/2016)  . CAD (coronary artery disease), native coronary artery    05/18/16  Cath severe 90% LAD prox, 99% mid, Circ dominant with 30% OM1, 50% OM2, 30% OM3, 60% distal circ and PL, severe stenosis nondominant RCA.  EF 55%  3.0x 20, 2.5 x 20 mm, 2.25 x 16 mm Promus premier sent to LAD Dr. Clifton James   . Chronic bronchitis (HCC)    "most years" (05/18/2016)  . Chronic lower back pain   . DDD (degenerative disc disease), lumbar   . Hyperlipidemia   . Hypertension   . Sleep apnea    "dx'd; never wore mask" (05/18/2016)  . Squamous carcinoma    cut off right upper arm, left shoulder; froze off nose and right side  of face  . Type II diabetes mellitus (HCC)   . Unstable angina (HCC)   . Vomiting     Past Surgical History:  Procedure Laterality Date  . CARDIAC CATHETERIZATION N/A 05/18/2016   Procedure: Left Heart Cath and Coronary Angiography;  Surgeon: Kathleene Hazel, MD;  Location: [facility] INVASIVE CV LAB;  Service: Cardiovascular;  Laterality: N/A;  . CARDIAC CATHETERIZATION N/A 05/18/2016   Procedure: Coronary Stent Intervention;  Surgeon: Kathleene Hazel, MD;  Location: [facility] INVASIVE CV LAB;  Service: Cardiovascular;  Laterality: N/A;  . CORONARY ANGIOPLASTY    . FOOT FRACTURE SURGERY Right 1970s  . LAPAROSCOPIC GASTRIC BANDING  07/2007   by Dr. Daphine Deutscher  . PILONIDAL CYST EXCISION  ~ 1985   "took 8 out all at once"  . SQUAMOUS CELL CARCINOMA EXCISION     right upper arm, left shoulder    Current Medications: Current Meds  Medication Sig  . aspirin EC 81 MG tablet Take 81 mg by mouth daily.  Marland Kitchen atorvastatin (LIPITOR) 40 MG tablet   . gabapentin (NEURONTIN) 100 MG capsule TAKE 2 CAPSULES BY MOUTH AT BEDTIME  . loratadine (CLARITIN) 10 MG tablet Take 10 mg by mouth daily.  Marland Kitchen losartan (COZAAR) 100 MG tablet  100 mg.   . metFORMIN (GLUCOPHAGE) 500 MG tablet Take 1,000 mg by mouth 2 (two) times daily with a meal.   . metoprolol tartrate (LOPRESSOR) 25 MG tablet Take 25 mg by mouth 2 (two) times daily.   . Multiple Vitamin (MULTIVITAMIN) capsule Take 1 capsule by mouth daily.  . nitroGLYCERIN (NITROSTAT) 0.4 MG SL tablet Place 1 tablet (0.4 mg total) under the tongue every 5 (five) minutes as needed for chest pain.  . Vitamin D, Ergocalciferol, (DRISDOL) 50000 units CAPS capsule Take 1 capsule (50,000 Units total) by mouth every 7 (seven) days.     Allergies:   Patient has no known allergies.   Social History   Socioeconomic History  . Marital status: Married    Spouse name: Not on file  . Number of children: Not on file  . Years of education: Not on file  . Highest education  level: Not on file  Occupational History  . Not on file  Social Needs  . Financial resource strain: Not on file  . Food insecurity:    Worry: Not on file    Inability: Not on file  . Transportation needs:    Medical: Not on file    Non-medical: Not on file  Tobacco Use  . Smoking status: Former Smoker    Packs/day: 0.50    Years: 12.00    Pack years: 6.00    Types: Cigarettes    Last attempt to quit: 12/15/1991    Years since quitting: 26.9  . Smokeless tobacco: Former Neurosurgeon    Types: Snuff  . Tobacco comment: "quit snuff in ~ 1981"  Substance and Sexual Activity  . Alcohol use: Yes    Alcohol/week: 7.0 standard drinks    Types: 7 Cans of beer per week    Comment: More social use  . Drug use: No  . Sexual activity: Yes  Lifestyle  . Physical activity:    Days per week: Not on file    Minutes per session: Not on file  . Stress: Not on file  Relationships  . Social connections:    Talks on phone: Not on file    Gets together: Not on file    Attends religious service: Not on file    Active member of club or organization: Not on file    Attends meetings of clubs or organizations: Not on file    Relationship status: Not on file  Other Topics Concern  . Not on file  Social History Narrative  . Not on file     Family History: The patient's family history includes Diabetes Mellitus I in his sister; Heart attack in his father; Heart disease in his father, mother, and sister; Stroke in his brother and father. ROS:   Please see the history of present illness.    All 14 point review of systems negative except as described per history of present illness  EKGs/Labs/Other Studies Reviewed:      Recent Labs: No results found for requested labs within last 8760 hours.  Recent Lipid Panel No results found for: CHOL, TRIG, HDL, CHOLHDL, VLDL, LDLCALC, LDLDIRECT  Physical Exam:    VS:  BP 120/70   Pulse 85   Ht 5\' 8"  (1.727 m)   Wt 239 lb (108.4 kg)   SpO2 98%   BMI  36.34 kg/m     Wt Readings from Last 3 Encounters:  12/07/18 239 lb (108.4 kg)  11/12/18 244 lb (110.7 kg)  05/18/18 242 lb (109.8  kg)     GEN:  Well nourished, well developed in no acute distress HEENT: Normal NECK: No JVD; No carotid bruits LYMPHATICS: No lymphadenopathy CARDIAC: RRR, no murmurs, no rubs, no gallops RESPIRATORY:  Clear to auscultation without rales, wheezing or rhonchi  ABDOMEN: Soft, non-tender, non-distended MUSCULOSKELETAL:  No edema; No deformity  SKIN: Warm and dry LOWER EXTREMITIES: no swelling NEUROLOGIC:  Alert and oriented x 3 PSYCHIATRIC:  Normal affect   ASSESSMENT:    1. Coronary artery disease involving native coronary artery with angina pectoris, unspecified whether native or transplanted heart (HCC)   2. Pure hypercholesterolemia   3. Hypertensive heart disease without CHF   4. Obesity (BMI 30-39.9)   5. Bariatric surgery status    PLAN:    In order of problems listed above:  1. Coronary disease doing well from that point of view. 2. Dyslipidemia.  We will continue present management fasting lipid profile will be checked 3. Recently lost significant weight after surgery. 4. ED plan as outlined above   Medication Adjustments/Labs and Tests Ordered: Current medicines are reviewed at length with the patient today.  Concerns regarding medicines are outlined above.  No orders of the defined types were placed in this encounter.  Medication changes: No orders of the defined types were placed in this encounter.   Signed, Georgeanna Lea, MD, [facility] 12/07/2018 3:11 PM    [facility]

## 2018-12-08 LAB — HEPATIC FUNCTION PANEL
ALBUMIN: 5 g/dL — AB (ref 3.8–4.9)
ALT: 22 IU/L (ref 0–44)
AST: 20 IU/L (ref 0–40)
Alkaline Phosphatase: 88 IU/L (ref 39–117)
Bilirubin Total: 0.5 mg/dL (ref 0.0–1.2)
Bilirubin, Direct: 0.14 mg/dL (ref 0.00–0.40)
TOTAL PROTEIN: 7 g/dL (ref 6.0–8.5)

## 2018-12-08 LAB — LIPID PANEL
CHOL/HDL RATIO: 2.2 ratio (ref 0.0–5.0)
Cholesterol, Total: 125 mg/dL (ref 100–199)
HDL: 57 mg/dL (ref >39)
LDL CALC: 47 mg/dL (ref 0–99)
Triglycerides: 103 mg/dL (ref 0–149)
VLDL CHOLESTEROL CAL: 21 mg/dL (ref 5–40)

## 2018-12-10 ENCOUNTER — Telehealth: Payer: Self-pay

## 2018-12-10 NOTE — Telephone Encounter (Signed)
-----   Message from Rajan R Revankar, MD sent at 12/10/2018  9:10 AM EDT ----- The results of the study is unremarkable. Please inform patient. I will discuss in detail at next appointment. Cc  primary care/referring physician Rajan R Revankar, MD 12/10/2018 9:10 AM 

## 2018-12-10 NOTE — Telephone Encounter (Signed)
Patient called and notified of lab results. 

## 2018-12-24 ENCOUNTER — Telehealth: Payer: Self-pay | Admitting: Cardiology

## 2018-12-24 NOTE — Telephone Encounter (Signed)
343

## 2018-12-24 NOTE — Telephone Encounter (Signed)
° °  Primary Cardiologist:  Revankar  Patient contacted.  History reviewed.  No symptoms to suggest any unstable cardiac conditions.  Based on discussion, with current pandemic situation, we will be postponing this appointment for Tim Cook with a plan for f/u in 12 wks or sooner if feasible/necessary.  If symptoms change, he has been instructed to contact our office.     [name]  12/24/2018 1:41 PM         .

## 2018-12-25 ENCOUNTER — Ambulatory Visit: Payer: BLUE CROSS/BLUE SHIELD | Admitting: Family Medicine

## 2018-12-28 ENCOUNTER — Ambulatory Visit (HOSPITAL_BASED_OUTPATIENT_CLINIC_OR_DEPARTMENT_OTHER): Payer: BLUE CROSS/BLUE SHIELD

## 2019-01-02 NOTE — Telephone Encounter (Signed)
Left message per Dr. Bing Matter that we will reschedule his appt out to 04/2019.

## 2019-01-17 ENCOUNTER — Telehealth: Payer: Self-pay | Admitting: Cardiology

## 2019-01-17 NOTE — Telephone Encounter (Signed)
°*  STAT* If patient is at the pharmacy, call can be transferred to refill team.   1. Which medications need to be refilled? (please list name of each medication and dose if known) metoprolol tartrate (LOPRESSOR) 25 MG tablet   2. Which pharmacy/location (including street and city if local pharmacy) is medication to be sent to? [facility] 3. Do they need a 30 day or 90 day supply? 90 dy

## 2019-01-18 ENCOUNTER — Telehealth (INDEPENDENT_AMBULATORY_CARE_PROVIDER_SITE_OTHER): Payer: BLUE CROSS/BLUE SHIELD | Admitting: Cardiology

## 2019-01-18 ENCOUNTER — Other Ambulatory Visit: Payer: Self-pay

## 2019-01-18 ENCOUNTER — Encounter: Payer: Self-pay | Admitting: Cardiology

## 2019-01-18 DIAGNOSIS — I119 Hypertensive heart disease without heart failure: Secondary | ICD-10-CM

## 2019-01-18 DIAGNOSIS — Z9884 Bariatric surgery status: Secondary | ICD-10-CM

## 2019-01-18 DIAGNOSIS — I25119 Atherosclerotic heart disease of native coronary artery with unspecified angina pectoris: Secondary | ICD-10-CM

## 2019-01-18 DIAGNOSIS — E7800 Pure hypercholesterolemia, unspecified: Secondary | ICD-10-CM

## 2019-01-18 NOTE — Telephone Encounter (Signed)
Refill request received from [facility] for metoprolol refill. Did not refill, pt taken off this med at today's visit.

## 2019-01-18 NOTE — Patient Instructions (Signed)
Medication Instructions:  Your physician has recommended you make the following change in your medication:  STOP: Metoprolol  If you need a refill on your cardiac medications before your next appointment, please call your pharmacy.   Lab work: None If you have labs (blood work) drawn today and your tests are completely normal, you will receive your results only by: Marland Kitchen MyChart Message (if you have MyChart) OR . A paper copy in the mail If you have any lab test that is abnormal or we need to change your treatment, we will call you to review the results.  Testing/Procedures: None  Follow-Up: At [facility], you and your health needs are our priority.  As part of our continuing mission to provide you with exceptional heart care, we have created designated Provider Care Teams.  These Care Teams include your primary Cardiologist (physician) and Advanced Practice Providers (APPs -  Physician Assistants and Nurse Practitioners) who all work together to provide you with the care you need, when yAny Other Special Instructions Will Be Listed Below (If Applicable).

## 2019-01-18 NOTE — Progress Notes (Signed)
Virtual Visit via Video Note   This visit type was conducted due to national recommendations for restrictions regarding the COVID-19 Pandemic (e.g. social distancing) in an effort to limit this patient's exposure and mitigate transmission in our community.  Due to his co-morbid illnesses, this patient is at least at moderate risk for complications without adequate follow up.  This format is felt to be most appropriate for this patient at this time.  All issues noted in this document were discussed and addressed.  A limited physical exam was performed with this format.  Please refer to the patient's chart for his consent to telehealth for [facility].  Evaluation Performed:  Follow-up visit  This visit type was conducted due to national recommendations for restrictions regarding the COVID-19 Pandemic (e.g. social distancing).  This format is felt to be most appropriate for this patient at this time.  All issues noted in this document were discussed and addressed.  No physical exam was performed (except for noted visual exam findings with Video Visits).  Please refer to the patient's chart (MyChart message for video visits and phone note for telephone visits) for the patient's consent to telehealth for [facility].  Date:  01/18/2019  ID: Tim Cook, DOB 06-30-1961, MRN [id number]   Patient Location: [address] [place] [address]   Provider location:   [facility] [place] [facility]  PCP:  Wilfrid Lund, [place]  Cardiologist:  Gypsy Balsam, MD     Chief Complaint: Doing well  History of Present Illness:    Tim Cook is a 58 y.o. male  who presents via audio/video conferencing for a telehealth visit today.  Does have past medical history significant for coronary artery disease.  Stent to LAD proximal and midportion in 2017 also diabetes hypertension as well as dyslipidemia another problem that he has is erectile dysfunction.  We spoke about a month ago and plan was to  do stress test to see how he does from cardiac standpoint review but overall he seems to be doing well.  Stress test has not been done because of coronavirus situation.  He is doing well he staying at home but he is trying to be active but described the fact that he does not do much exercises and I stressed importance of doing this last time we spoke about Mediterranean diet however he admits that he cannot get all stopped that he needs right now to keep up with Mediterranean diet because of rhinovirus situation.   The patient does not have symptoms concerning for COVID-19 infection (fever, chills, cough, or new SHORTNESS OF BREATH).    Prior CV studies:   The following studies were reviewed today:       Past Medical History:  Diagnosis Date  . Arthritis    "knees, lower back, hands" (05/18/2016)  . CAD (coronary artery disease), native coronary artery    05/18/16  Cath severe 90% LAD prox, 99% mid, Circ dominant with 30% OM1, 50% OM2, 30% OM3, 60% distal circ and PL, severe stenosis nondominant RCA.  EF 55%  3.0x 20, 2.5 x 20 mm, 2.25 x 16 mm Promus premier sent to LAD Dr. Clifton James   . Chronic bronchitis (HCC)    "most years" (05/18/2016)  . Chronic lower back pain   . DDD (degenerative disc disease), lumbar   . Hyperlipidemia   . Hypertension   . Sleep apnea    "dx'd; never wore mask" (05/18/2016)  . Squamous carcinoma  cut off right upper arm, left shoulder; froze off nose and right side of face  . Type II diabetes mellitus (HCC)   . Unstable angina (HCC)   . Vomiting     Past Surgical History:  Procedure Laterality Date  . CARDIAC CATHETERIZATION N/A 05/18/2016   Procedure: Left Heart Cath and Coronary Angiography;  Surgeon: Kathleene Hazelhristopher D McAlhany, MD;  [facility];  Service: Cardiovascular;  Laterality: N/A;  . CARDIAC CATHETERIZATION N/A 05/18/2016   Procedure: Coronary Stent Intervention;  Surgeon: Kathleene Hazelhristopher D McAlhany, MD;  Location: [facility] INVASIVE CV LAB;   Service: Cardiovascular;  Laterality: N/A;  . CORONARY ANGIOPLASTY    . FOOT FRACTURE SURGERY Right 1970s  . LAPAROSCOPIC GASTRIC BANDING  07/2007   by Dr. Daphine DeutscherMartin  . PILONIDAL CYST EXCISION  ~ 1985   "took 8 out all at once"  . SQUAMOUS CELL CARCINOMA EXCISION     right upper arm, left shoulder     Current Meds  Medication Sig  . aspirin EC 81 MG tablet Take 81 mg by mouth daily.  Marland Kitchen. atorvastatin (LIPITOR) 40 MG tablet   . FARXIGA 5 MG TABS tablet Take 1 tablet by mouth daily.  Marland Kitchen. gabapentin (NEURONTIN) 100 MG capsule TAKE 2 CAPSULES BY MOUTH AT BEDTIME  . loratadine (CLARITIN) 10 MG tablet Take 10 mg by mouth daily.  Marland Kitchen. losartan (COZAAR) 100 MG tablet 100 mg.   . metFORMIN (GLUCOPHAGE) 500 MG tablet Take 1,000 mg by mouth 2 (two) times daily with a meal.   . metoprolol tartrate (LOPRESSOR) 25 MG tablet Take 25 mg by mouth 2 (two) times daily.   . Multiple Vitamin (MULTIVITAMIN) capsule Take 1 capsule by mouth daily.  . nitroGLYCERIN (NITROSTAT) 0.4 MG SL tablet Place 1 tablet (0.4 mg total) under the tongue every 5 (five) minutes as needed for chest pain. Please give 90 day supply in the small bottles  . Vitamin D, Ergocalciferol, (DRISDOL) 50000 units CAPS capsule Take 1 capsule (50,000 Units total) by mouth every 7 (seven) days.      Family History: The patient's family history includes Diabetes Mellitus I in his sister; Heart attack in his father; Heart disease in his father, mother, and sister; Stroke in his brother and father.   ROS:   Please see the history of present illness.     All other systems reviewed and are negative.   Labs/Other Tests and Data Reviewed:     Recent Labs: 12/07/2018: ALT 22  Recent Lipid Panel    Component Value Date/Time   CHOL 125 12/07/2018 1520   TRIG 103 12/07/2018 1520   HDL 57 12/07/2018 1520   CHOLHDL 2.2 12/07/2018 1520   LDLCALC 47 12/07/2018 1520      Exam:    Vital Signs:  There were no vitals taken for this visit.     Wt Readings from Last 3 Encounters:  12/07/18 239 lb (108.4 kg)  11/12/18 244 lb (110.7 kg)  05/18/18 242 lb (109.8 kg)     Well nourished, well developed in no acute distress. Alert awake oriented x3.  With talking over a video link.  He is in his living room no swelling of lower extremities no JVD  Diagnosis for this visit:   1. Coronary artery disease involving native coronary artery with angina pectoris, unspecified whether native or transplanted heart (HCC)   2. Bariatric surgery status   3. Pure hypercholesterolemia   4. Hypertensive heart disease without CHF  ASSESSMENT & PLAN:    1.  Coronary artery disease asymptomatic.  We will continue present management. 2.  Status post bariatric surgery.  Doing well from that point review about the need to be a little more active and we spent some time talking about it. 3.  Dyslipidemia last fasting profile was excellent we will continue present management with high intensity statin. 4.  Erectile dysfunction: Problem I told him to stop metoprolol he takes only 25 mg twice daily I told him also to keep his blood pressure in check.  And see if that will help with erectile dysfunction he may required medication like Viagra.  We will talk in about 1 month to see how he is doing from a rectal dysfunction point review as well as from my high blood pressure point review.  COVID-19 Education: The signs and symptoms of COVID-19 were discussed with the patient and how to seek care for testing (follow up with PCP or arrange E-visit).  The importance of social distancing was discussed today.  Patient Risk:   After full review of this patients clinical status, I feel that they are at least moderate risk at this time.  Time:   Today, I have spent 19 minutes with the patient with telehealth technology discussing pt health issues.  I spent 5 minutes reviewing her chart before the visit.  Visit was finished at 9:54 AM.    Medication  Adjustments/Labs and Tests Ordered: Current medicines are reviewed at length with the patient today.  Concerns regarding medicines are outlined above.  No orders of the defined types were placed in this encounter.  Medication changes: No orders of the defined types were placed in this encounter.    Disposition: Follow-up in 1 month.  Discontinue metoprolol  Signed, Georgeanna Lea, MD, [facility] 01/18/2019 9:56 AM    [facility]

## 2019-01-20 NOTE — Progress Notes (Signed)
Tawana ScaleZach Naveena Cook D.O. [facility] [address] [place], [place] [address] Phone: [phone] Subjective:     Tim Cook, Tim Cook, am serving as a scribe for Dr. Antoine PrimasZachary Birttany Cook.   CC: back pain follow-up  BJY:[id number]:Subjective  Tim FleetGeorge H Cook is a 58 y.o. male coming in with complaint of low back pain follow-up. States that his back pain has increased but he has not been able to come in since February due to Covid. Also is having left hand middle finger pain. Continues to have swelling and pain. Larey SeatFell on New Year's Eve and his finger has not been the same since then.  Patient continues to have some discomfort more of the lower back now.  Has been sitting a lot more frequently.    Past Medical History:  Diagnosis Date  . Arthritis    "knees, lower back, hands" (05/18/2016)  . CAD (coronary artery disease), native coronary artery    05/18/16  Cath severe 90% LAD prox, 99% mid, Circ dominant with 30% OM1, 50% OM2, 30% OM3, 60% distal circ and PL, severe stenosis nondominant RCA.  EF 55%  3.0x 20, 2.5 x 20 mm, 2.25 x 16 mm Promus premier sent to LAD Dr. Clifton JamesMcALhany   . Chronic bronchitis (HCC)    "most years" (05/18/2016)  . Chronic lower back pain   . DDD (degenerative disc disease), lumbar   . Hyperlipidemia   . Hypertension   . Sleep apnea    "dx'd; never wore mask" (05/18/2016)  . Squamous carcinoma    cut off right upper arm, left shoulder; froze off nose and right side of face  . Type II diabetes mellitus (HCC)   . Unstable angina (HCC)   . Vomiting    Past Surgical History:  Procedure Laterality Date  . CARDIAC CATHETERIZATION N/A 05/18/2016   Procedure: Left Heart Cath and Coronary Angiography;  Surgeon: Kathleene Hazelhristopher D McAlhany, MD;  [facility];  Service: Cardiovascular;  Laterality: N/A;  . CARDIAC CATHETERIZATION N/A 05/18/2016   Procedure: Coronary Stent Intervention;  Surgeon: Kathleene Hazelhristopher D McAlhany, MD;  Location: [facility]VASIVE CV LAB;  Service: Cardiovascular;   Laterality: N/A;  . CORONARY ANGIOPLASTY    . FOOT FRACTURE SURGERY Right 1970s  . LAPAROSCOPIC GASTRIC BANDING  07/2007   by Dr. Daphine DeutscherMartin  . PILONIDAL CYST EXCISION  ~ 1985   "took 8 out all at once"  . SQUAMOUS CELL CARCINOMA EXCISION     right upper arm, left shoulder   Social History   Socioeconomic History  . Marital status: Married    Spouse name: Not on file  . Number of children: Not on file  . Years of education: Not on file  . Highest education level: Not on file  Occupational History  . Not on file  Social Needs  . Financial resource strain: Not on file  . Food insecurity:    Worry: Not on file    Inability: Not on file  . Transportation needs:    Medical: Not on file    Non-medical: Not on file  Tobacco Use  . Smoking status: Former Smoker    Packs/day: 0.50    Years: 12.00    Pack years: 6.00    Types: Cigarettes    Last attempt to quit: 12/15/1991    Years since quitting: 27.1  . Smokeless tobacco: Former NeurosurgeonUser    Types: Snuff  . Tobacco comment: "quit snuff in ~ 1981"  Substance and Sexual Activity  . Alcohol use:  Yes    Alcohol/week: 7.0 standard drinks    Types: 7 Cans of beer per week    Comment: More social use  . Drug use: No  . Sexual activity: Yes  Lifestyle  . Physical activity:    Days per week: Not on file    Minutes per session: Not on file  . Stress: Not on file  Relationships  . Social connections:    Talks on phone: Not on file    Gets together: Not on file    Attends religious service: Not on file    Active member of club or organization: Not on file    Attends meetings of clubs or organizations: Not on file    Relationship status: Not on file  Other Topics Concern  . Not on file  Social History Narrative  . Not on file   No Known Allergies Family History  Problem Relation Age of Onset  . Heart disease Mother   . Heart disease Father   . Stroke Father   . Heart attack Father   . Heart disease Sister   . Diabetes  Mellitus I Sister   . Stroke Brother     Current Outpatient Medications (Endocrine & Metabolic):  Marland Kitchen  FARXIGA 5 MG TABS tablet, Take 1 tablet by mouth daily. .  metFORMIN (GLUCOPHAGE) 500 MG tablet, Take 1,000 mg by mouth 2 (two) times daily with a meal.   Current Outpatient Medications (Cardiovascular):  .  atorvastatin (LIPITOR) 40 MG tablet,  .  losartan (COZAAR) 100 MG tablet, 100 mg.  .  nitroGLYCERIN (NITROSTAT) 0.4 MG SL tablet, Place 1 tablet (0.4 mg total) under the tongue every 5 (five) minutes as needed for chest pain. Please give 90 day supply in the small bottles  Current Outpatient Medications (Respiratory):  .  loratadine (CLARITIN) 10 MG tablet, Take 10 mg by mouth daily.  Current Outpatient Medications (Analgesics):  .  aspirin EC 81 MG tablet, Take 81 mg by mouth daily.   Current Outpatient Medications (Other):  .  gabapentin (NEURONTIN) 100 MG capsule, TAKE 2 CAPSULES BY MOUTH AT BEDTIME .  Multiple Vitamin (MULTIVITAMIN) capsule, Take 1 capsule by mouth daily. .  Vitamin D, Ergocalciferol, (DRISDOL) 50000 units CAPS capsule, Take 1 capsule (50,000 Units total) by mouth every 7 (seven) days.    Past medical history, social, surgical and family history all reviewed in electronic medical record.  No pertanent information unless stated regarding to the chief complaint.   Review of Systems:  No headache, visual changes, nausea, vomiting, diarrhea, constipation, dizziness, abdominal pain, skin rash, fevers, chills, night sweats, weight loss, swollen lymph nodes, body aches, joint swelling, chest pain, shortness of breath, mood changes.  Positive muscle aches  Objective  Blood pressure 112/70, pulse 93, height  (1.727 m), weight 245 lb (111.1 kg), SpO2 97 %.    General: No apparent distress alert and oriented x3 mood and affect normal, dressed appropriately.  HEENT: Pupils equal, extraocular movements intact  Respiratory: Patient's speak in full sentences and  does not appear short of breath  Cardiovascular: No lower extremity edema, non tender, no erythema  Skin: Warm dry intact with no signs of infection or rash on extremities or on axial skeleton.  Abdomen: Soft nontender  Neuro: Cranial nerves II through XII are intact, neurovascularly intact in all extremities with 2+ DTRs and 2+ pulses.  Lymph: No lymphadenopathy of posterior or anterior cervical chain or axillae bilaterally.  Gait normal with good balance and  coordination.  MSK:  Non tender with full range of motion and good stability and symmetric strength and tone of  elbows, wrist, hip, knee and ankles bilaterally.   Right shoulder exam shows the patient does have positive impingement with Neer's and Hawkins.  Patient has full strength of the rotator cuff.  Near full range of motion lacking last 5 degrees of external rotation but near symmetric to the contralateral side Back Exam:  Inspection: Loss of lordosis poor core strength Motion: Flexion 35 deg, Extension 25 deg, Side Bending to 35 deg bilaterally, Rotation to 35 deg bilaterally  SLR laying: Negative  XSLR laying: Negative  Palpable tenderness: Tender to palpation of paraspinal musculature.Marland Kitchen FABER: Tightness bilaterally. Sensory change: Gross sensation intact to all lumbar and sacral dermatomes.  Reflexes: 2+ at both patellar tendons, 2+ at achilles tendons, Babinski's downgoing.  Strength at foot  Plantar-flexion: 5/5 Dorsi-flexion: 5/5 Eversion: 5/5 Inversion: 5/5  Leg strength  Quad: 5/5 Hamstring: 5/5 Hip flexor: 5/5 Hip abductors: 4/5 but symmetric Gait unremarkable.  Left hand exam shows some mild swelling of the fingertips compared to the contralateral side.  Seems to be the PIP and DIPs bilaterally minorly more on the middle finger  Osteopathic findings C2 flexed rotated and side bent right C5 flexed rotated and side bent left T5 extended rotated and side bent right inhaled rib T9 extended rotated and side bent left  L1 flexed rotated and side bent right Sacrum right on right  [id number]; 15 additional minutes spent for Therapeutic exercises as stated in above notes.  This included exercises focusing on stretching, strengthening, with significant focus on eccentric aspects.   Long term goals include an improvement in range of motion, strength, endurance as well as avoiding reinjury. Patient's frequency would include in 1-2 times a day, 3-5 times a week for a duration of 6-12 weeks.Shoulder Exercises that included:  Basic scapular stabilization to include adduction and depression of scapula Scaption, focusing on proper movement and good control Internal and External rotation utilizing a theraband, with elbow tucked at side entire time Rows with theraband which was given today   Proper technique shown and discussed handout in great detail with ATC.  All questions were discussed and answered.      Impression and Recommendations:     This case required medical decision making of moderate complexity. The above documentation has been reviewed and is accurate and complete Judi Saa, DO       Note: This dictation was prepared with Dragon dictation along with smaller phrase technology. Any transcriptional errors that result from this process are unintentional.

## 2019-01-21 ENCOUNTER — Ambulatory Visit: Payer: BLUE CROSS/BLUE SHIELD | Admitting: Family Medicine

## 2019-01-21 ENCOUNTER — Other Ambulatory Visit: Payer: Self-pay

## 2019-01-21 ENCOUNTER — Encounter: Payer: Self-pay | Admitting: Family Medicine

## 2019-01-21 VITALS — BP 112/70 | HR 93 | Ht 68.0 in | Wt 245.0 lb

## 2019-01-21 DIAGNOSIS — S63253A Unspecified dislocation of left middle finger, initial encounter: Secondary | ICD-10-CM | POA: Insufficient documentation

## 2019-01-21 DIAGNOSIS — M5136 Other intervertebral disc degeneration, lumbar region: Secondary | ICD-10-CM | POA: Diagnosis not present

## 2019-01-21 DIAGNOSIS — M7551 Bursitis of right shoulder: Secondary | ICD-10-CM

## 2019-01-21 DIAGNOSIS — M51369 Other intervertebral disc degeneration, lumbar region without mention of lumbar back pain or lower extremity pain: Secondary | ICD-10-CM

## 2019-01-21 DIAGNOSIS — M999 Biomechanical lesion, unspecified: Secondary | ICD-10-CM | POA: Diagnosis not present

## 2019-01-21 DIAGNOSIS — M79642 Pain in left hand: Secondary | ICD-10-CM | POA: Diagnosis not present

## 2019-01-21 HISTORY — DX: Unspecified dislocation of left middle finger, initial encounter: S63.253A

## 2019-01-21 HISTORY — DX: Bursitis of right shoulder: M75.51

## 2019-01-21 NOTE — Assessment & Plan Note (Signed)
Decision today to treat with OMT was based on Physical Exam  After verbal consent patient was treated with HVLA, ME, FPR techniques in  thoracic, lumbar and sacral areas  Patient tolerated the procedure well with improvement in symptoms  Patient given exercises, stretches and lifestyle modifications  See medications in patient instructions if given  Patient will follow up in 5-6 weeks 

## 2019-01-21 NOTE — Patient Instructions (Signed)
Good to see you  Wrap the finger daily for2 weeks For shoulder Exercises 3 times a week.   See me again in 5-6 weeks

## 2019-01-21 NOTE — Assessment & Plan Note (Signed)
Likely lateral plate dislocation.  Patient will do taping regularly.  See how patient responds.  Hold on any imaging at the moment.

## 2019-01-21 NOTE — Assessment & Plan Note (Signed)
Right shoulder bursitis.  Discussed with patient in great length about icing regimen, home exercise, which activities to do which wants to avoid.  Patient will increase activity slowly over the course of next several days.  Discussed posture and ergonomics.  Discussed the scapular dyskinesis that could also be playing a role.  Follow-up again in 4 to 6 weeks

## 2019-01-21 NOTE — Assessment & Plan Note (Signed)
Patient does have degenerative disc disease, does respond well to osteopathic manipulation.  Discussed posture, ergonomics, which activities of doing which wants to avoid.  Patient will increase activity slowly.  Follow-up again in 5 to 6 weeks.

## 2019-02-15 ENCOUNTER — Telehealth: Payer: BLUE CROSS/BLUE SHIELD | Admitting: Cardiology

## 2019-02-24 NOTE — Progress Notes (Signed)
[name] [address] [place], [place] [address] Phone: [phone] Subjective:   Tim Cook, am serving as a scribe for Dr. Antoine Primas.    CC: Low back pain, finger pain follow-up  [id number]  Tim Cook is a 58 y.o. male coming in with complaint of back and left 4th finger pain. Has not been able to tolerate anything on his hand including taping or splinting. Still unable to fully flex finger. Is also having an increase in back pain for the past few days. Was traveling and feels tight and sore. Has been using aspercream for pain.      Past Medical History:  Diagnosis Date  . Arthritis    "knees, lower back, hands" (05/18/2016)  . CAD (coronary artery disease), native coronary artery    05/18/16  Cath severe 90% LAD prox, 99% mid, Circ dominant with 30% OM1, 50% OM2, 30% OM3, 60% distal circ and PL, severe stenosis nondominant RCA.  EF 55%  3.0x 20, 2.5 x 20 mm, 2.25 x 16 mm Promus premier sent to LAD Dr. Clifton James   . Chronic bronchitis (HCC)    "most years" (05/18/2016)  . Chronic lower back pain   . DDD (degenerative disc disease), lumbar   . Hyperlipidemia   . Hypertension   . Sleep apnea    "dx'd; never wore mask" (05/18/2016)  . Squamous carcinoma    cut off right upper arm, left shoulder; froze off nose and right side of face  . Type II diabetes mellitus (HCC)   . Unstable angina (HCC)   . Vomiting    Past Surgical History:  Procedure Laterality Date  . CARDIAC CATHETERIZATION N/A 05/18/2016   Procedure: Left Heart Cath and Coronary Angiography;  Surgeon: Kathleene Hazel, MD;  Location: [facility] INVASIVE CV LAB;  Service: Cardiovascular;  Laterality: N/A;  . CARDIAC CATHETERIZATION N/A 05/18/2016   Procedure: Coronary Stent Intervention;  Surgeon: Kathleene Hazel, MD;  Location: [facility] INVASIVE CV LAB;  Service: Cardiovascular;  Laterality: N/A;  . CORONARY ANGIOPLASTY    . FOOT FRACTURE SURGERY Right 1970s  .  LAPAROSCOPIC GASTRIC BANDING  07/2007   by Dr. Daphine Deutscher  . PILONIDAL CYST EXCISION  ~ 1985   "took 8 out all at once"  . SQUAMOUS CELL CARCINOMA EXCISION     right upper arm, left shoulder   Social History   Socioeconomic History  . Marital status: Married    Spouse name: Not on file  . Number of children: Not on file  . Years of education: Not on file  . Highest education level: Not on file  Occupational History  . Not on file  Social Needs  . Financial resource strain: Not on file  . Food insecurity:    Worry: Not on file    Inability: Not on file  . Transportation needs:    Medical: Not on file    Non-medical: Not on file  Tobacco Use  . Smoking status: Former Smoker    Packs/day: 0.50    Years: 12.00    Pack years: 6.00    Types: Cigarettes    Last attempt to quit: 12/15/1991    Years since quitting: 27.2  . Smokeless tobacco: Former Neurosurgeon    Types: Snuff  . Tobacco comment: "quit snuff in ~ 1981"  Substance and Sexual Activity  . Alcohol use: Yes    Alcohol/week: 7.0 standard drinks    Types: 7 Cans of beer per week  Comment: More social use  . Drug use: No  . Sexual activity: Yes  Lifestyle  . Physical activity:    Days per week: Not on file    Minutes per session: Not on file  . Stress: Not on file  Relationships  . Social connections:    Talks on phone: Not on file    Gets together: Not on file    Attends religious service: Not on file    Active member of club or organization: Not on file    Attends meetings of clubs or organizations: Not on file    Relationship status: Not on file  Other Topics Concern  . Not on file  Social History Narrative  . Not on file   No Known Allergies Family History  Problem Relation Age of Onset  . Heart disease Mother   . Heart disease Father   . Stroke Father   . Heart attack Father   . Heart disease Sister   . Diabetes Mellitus I Sister   . Stroke Brother     Current Outpatient Medications (Endocrine &  Metabolic):  Marland Kitchen.  FARXIGA 5 MG TABS tablet, Take 1 tablet by mouth daily. .  metFORMIN (GLUCOPHAGE) 500 MG tablet, Take 1,000 mg by mouth 2 (two) times daily with a meal.   Current Outpatient Medications (Cardiovascular):  .  atorvastatin (LIPITOR) 40 MG tablet,  .  losartan (COZAAR) 100 MG tablet, 100 mg.  .  nitroGLYCERIN (NITROSTAT) 0.4 MG SL tablet, Place 1 tablet (0.4 mg total) under the tongue every 5 (five) minutes as needed for chest pain. Please give 90 day supply in the small bottles  Current Outpatient Medications (Respiratory):  .  loratadine (CLARITIN) 10 MG tablet, Take 10 mg by mouth daily.  Current Outpatient Medications (Analgesics):  .  aspirin EC 81 MG tablet, Take 81 mg by mouth daily. .  colchicine 0.6 MG tablet, Take 1 tablet (0.6 mg total) by mouth 2 (two) times daily.   Current Outpatient Medications (Other):  Marland Kitchen.  Multiple Vitamin (MULTIVITAMIN) capsule, Take 1 capsule by mouth daily. .  Vitamin D, Ergocalciferol, (DRISDOL) 50000 units CAPS capsule, Take 1 capsule (50,000 Units total) by mouth every 7 (seven) days.    Past medical history, social, surgical and family history all reviewed in electronic medical record.  No pertanent information unless stated regarding to the chief complaint.   Review of Systems:  No headache, visual changes, nausea, vomiting, diarrhea, constipation, dizziness, abdominal pain, skin rash, fevers, chills, night sweats, weight loss, swollen lymph nodes, body aches, joint swelling, chest pain, shortness of breath, mood changes.  Positive muscle aches  Objective  Blood pressure 138/86, pulse 98, height 5\' 8"  (1.727 m), weight 247 lb (112 kg), SpO2 98 %.    General: No apparent distress alert and oriented x3 mood and affect normal, dressed appropriately.  HEENT: Pupils equal, extraocular movements intact  Respiratory: Patient's speak in full sentences and does not appear short of breath  Cardiovascular: No lower extremity edema, non  tender, no erythema  Skin: Warm dry intact with no signs of infection or rash on extremities or on axial skeleton.  Abdomen: Soft nontender  Neuro: Cranial nerves II through XII are intact, neurovascularly intact in all extremities with 2+ DTRs and 2+ pulses.  Lymph: No lymphadenopathy of posterior or anterior cervical chain or axillae bilaterally.  Gait normal with good balance and coordination.  MSK:  Non tender with full range of motion and good stability and symmetric strength  and tone of shoulders, elbows, wrist, hip, knee and ankles bilaterally.  Back Exam:  Inspection: Poor core strength Motion: Flexion 45 deg, Extension 35 deg, Side Bending to 25 deg bilaterally,  Rotation to 45 deg bilaterally  SLR laying: Negative  XSLR laying: Negative  Palpable tenderness: None. FABER: negative. Sensory change: Gross sensation intact to all lumbar and sacral dermatomes.  Reflexes: 2+ at both patellar tendons, 2+ at achilles tendons, Babinski's downgoing.  Strength at foot  Plantar-flexion: 5/5 Dorsi-flexion: 5/5 Eversion: 5/5 Inversion: 5/5  Leg strength  Quad: 5/5 Hamstring: 5/5 Hip flexor: 5/5 Hip abductors: 5/5  Gait unremarkable.  Osteopathic findings T8 extended rotated and side bent left L1 flexed rotated and side bent right Sacrum right on right    Impression and Recommendations:     This case required medical decision making of moderate complexity. The above documentation has been reviewed and is accurate and complete Judi Saa, DO       Note: This dictation was prepared with Dragon dictation along with smaller phrase technology. Any transcriptional errors that result from this process are unintentional.

## 2019-02-25 ENCOUNTER — Encounter: Payer: Self-pay | Admitting: Cardiology

## 2019-02-25 ENCOUNTER — Ambulatory Visit (INDEPENDENT_AMBULATORY_CARE_PROVIDER_SITE_OTHER)
Admission: RE | Admit: 2019-02-25 | Discharge: 2019-02-25 | Disposition: A | Discharge status: Home / Self Care | Payer: BLUE CROSS/BLUE SHIELD | Source: Ambulatory Visit | Attending: Family Medicine | Admitting: Family Medicine

## 2019-02-25 ENCOUNTER — Telehealth (INDEPENDENT_AMBULATORY_CARE_PROVIDER_SITE_OTHER): Payer: BLUE CROSS/BLUE SHIELD | Admitting: Cardiology

## 2019-02-25 ENCOUNTER — Encounter: Payer: Self-pay | Admitting: Family Medicine

## 2019-02-25 ENCOUNTER — Other Ambulatory Visit: Payer: Self-pay

## 2019-02-25 ENCOUNTER — Ambulatory Visit (INDEPENDENT_AMBULATORY_CARE_PROVIDER_SITE_OTHER): Payer: BLUE CROSS/BLUE SHIELD | Admitting: Family Medicine

## 2019-02-25 VITALS — BP 138/86 | Wt 242.0 lb

## 2019-02-25 VITALS — BP 138/86 | HR 98 | Ht 68.0 in | Wt 247.0 lb

## 2019-02-25 DIAGNOSIS — M999 Biomechanical lesion, unspecified: Secondary | ICD-10-CM | POA: Diagnosis not present

## 2019-02-25 DIAGNOSIS — E7800 Pure hypercholesterolemia, unspecified: Secondary | ICD-10-CM

## 2019-02-25 DIAGNOSIS — Z9884 Bariatric surgery status: Secondary | ICD-10-CM

## 2019-02-25 DIAGNOSIS — S6992XA Unspecified injury of left wrist, hand and finger(s), initial encounter: Secondary | ICD-10-CM | POA: Diagnosis not present

## 2019-02-25 DIAGNOSIS — M5136 Other intervertebral disc degeneration, lumbar region: Secondary | ICD-10-CM

## 2019-02-25 DIAGNOSIS — M7989 Other specified soft tissue disorders: Secondary | ICD-10-CM | POA: Diagnosis not present

## 2019-02-25 DIAGNOSIS — M79645 Pain in left finger(s): Secondary | ICD-10-CM | POA: Diagnosis not present

## 2019-02-25 DIAGNOSIS — S63253D Unspecified dislocation of left middle finger, subsequent encounter: Secondary | ICD-10-CM | POA: Diagnosis not present

## 2019-02-25 DIAGNOSIS — I119 Hypertensive heart disease without heart failure: Secondary | ICD-10-CM

## 2019-02-25 DIAGNOSIS — M79642 Pain in left hand: Secondary | ICD-10-CM | POA: Diagnosis not present

## 2019-02-25 MED ORDER — SILDENAFIL CITRATE 25 MG PO TABS: 25.0000 mg | ORAL_TABLET | Freq: Every day | ORAL | 3 refills | Status: DC | PRN

## 2019-02-25 MED ORDER — COLCHICINE 0.6 MG PO TABS: 0.6000 mg | ORAL_TABLET | Freq: Two times a day (BID) | ORAL | 2 refills | Status: DC

## 2019-02-25 NOTE — Progress Notes (Signed)
Virtual Visit via Video Note   This visit type was conducted due to national recommendations for restrictions regarding the COVID-19 Pandemic (e.g. social distancing) in an effort to limit this patient's exposure and mitigate transmission in our community.  Due to his co-morbid illnesses, this patient is at least at moderate risk for complications without adequate follow up.  This format is felt to be most appropriate for this patient at this time.  All issues noted in this document were discussed and addressed.  A limited physical exam was performed with this format.  Please refer to the patient's chart for his consent to telehealth for [facility].  Evaluation Performed:  Follow-up visit  This visit type was conducted due to national recommendations for restrictions regarding the COVID-19 Pandemic (e.g. social distancing).  This format is felt to be most appropriate for this patient at this time.  All issues noted in this document were discussed and addressed.  No physical exam was performed (except for noted visual exam findings with Video Visits).  Please refer to the patient's chart (MyChart message for video visits and phone note for telephone visits) for the patient's consent to telehealth for [facility].  Date:  02/25/2019  ID: Tim Cook, DOB 1961/09/22, MRN [id number]   Patient Location: [address] [place] [address]   Provider location:   [facility] [place] [facility]  PCP:  Wilfrid Lund, [place]  Cardiologist:  Gypsy Balsam, MD     Chief Complaint: Doing well  History of Present Illness:    Tim Cook is a 58 y.o. male  who presents via audio/video conferencing for a telehealth visit today.  With coronary artery disease, hyperlipidemia, hypertension, sleep apnea.  We do have tele-visit to talk about his issues numerous problems that we identified last time is the fact that he does have erectile dysfunction.  We were cutting down metoprolol hoping that  that will improve the situation that does not appears to be the case.  I think reached the point that some medication needed.  I will give him prescription for Viagra 25 daily I want him about no usage of nitroglycerin when he take that medication.  Otherwise he is doing well denies have any chest pain tightness squeezing pressure burning chest.  Try to walk on a regular basis slowly because of his back problem   The patient does not have symptoms concerning for COVID-19 infection (fever, chills, cough, or new SHORTNESS OF BREATH).    Prior CV studies:   The following studies were reviewed today:       Past Medical History:  Diagnosis Date   Arthritis    "knees, lower back, hands" (05/18/2016)   CAD (coronary artery disease), native coronary artery    05/18/16  Cath severe 90% LAD prox, 99% mid, Circ dominant with 30% OM1, 50% OM2, 30% OM3, 60% distal circ and PL, severe stenosis nondominant RCA.  EF 55%  3.0x 20, 2.5 x 20 mm, 2.25 x 16 mm Promus premier sent to LAD Dr. Clifton James    Chronic bronchitis [facility])    "most years" (05/18/2016)   Chronic lower back pain    DDD (degenerative disc disease), lumbar    Hyperlipidemia    Hypertension    Sleep apnea    "dx'd; never wore mask" (05/18/2016)   Squamous carcinoma    cut off right upper arm, left shoulder; froze off nose and right side of face   Type II diabetes mellitus (HCC)  Unstable angina (HCC)    Vomiting     Past Surgical History:  Procedure Laterality Date   CARDIAC CATHETERIZATION N/A 05/18/2016   Procedure: Left Heart Cath and Coronary Angiography;  Surgeon: Kathleene Hazel, MD;  Location: [facility] INVASIVE CV LAB;  Service: Cardiovascular;  Laterality: N/A;   CARDIAC CATHETERIZATION N/A 05/18/2016   Procedure: Coronary Stent Intervention;  Surgeon: Kathleene Hazel, MD;  Location: [facility] INVASIVE CV LAB;  Service: Cardiovascular;  Laterality: N/A;   CORONARY ANGIOPLASTY     FOOT FRACTURE SURGERY Right  1970s   LAPAROSCOPIC GASTRIC BANDING  07/2007   by Dr. Daphine Deutscher   PILONIDAL CYST EXCISION  ~ 1985   "took 8 out all at once"   SQUAMOUS CELL CARCINOMA EXCISION     right upper arm, left shoulder     Current Meds  Medication Sig   aspirin EC 81 MG tablet Take 81 mg by mouth daily.   atorvastatin (LIPITOR) 40 MG tablet    colchicine 0.6 MG tablet Take 1 tablet (0.6 mg total) by mouth 2 (two) times daily.   FARXIGA 5 MG TABS tablet Take 1 tablet by mouth daily.   loratadine (CLARITIN) 10 MG tablet Take 10 mg by mouth daily.   losartan (COZAAR) 100 MG tablet 100 mg.    metFORMIN (GLUCOPHAGE) 500 MG tablet Take 1,000 mg by mouth 2 (two) times daily with a meal.    Multiple Vitamin (MULTIVITAMIN) capsule Take 1 capsule by mouth daily.   nitroGLYCERIN (NITROSTAT) 0.4 MG SL tablet Place 1 tablet (0.4 mg total) under the tongue every 5 (five) minutes as needed for chest pain. Please give 90 day supply in the small bottles   Vitamin D, Ergocalciferol, (DRISDOL) 50000 units CAPS capsule Take 1 capsule (50,000 Units total) by mouth every 7 (seven) days.      Family History: The patient's family history includes Diabetes Mellitus I in his sister; Heart attack in his father; Heart disease in his father, mother, and sister; Stroke in his brother and father.   ROS:   Please see the history of present illness.     All other systems reviewed and are negative.   Labs/Other Tests and Data Reviewed:     Recent Labs: 12/07/2018: ALT 22  Recent Lipid Panel    Component Value Date/Time   CHOL 125 12/07/2018 1520   TRIG 103 12/07/2018 1520   HDL 57 12/07/2018 1520   CHOLHDL 2.2 12/07/2018 1520   LDLCALC 47 12/07/2018 1520      Exam:    Vital Signs:  BP 138/86    Wt 242 lb (109.8 kg)    BMI 36.80 kg/m     Wt Readings from Last 3 Encounters:  02/25/19 242 lb (109.8 kg)  02/25/19 247 lb (112 kg)  01/21/19 245 lb (111.1 kg)     Well nourished, well developed in no acute  distress. Alert awake and x3 talking to me via video link happy the way he feels.  He talks a lot about his stay in [place] he was born and grew up in [place].  Diagnosis for this visit:   1. Pure hypercholesterolemia   2. Bariatric surgery status   3. Hypertensive heart disease without CHF      ASSESSMENT & PLAN:    1.  Dyslipidemia on appropriate medication which I will continue. 2.  Hypertensive heart disease blood pressure well controlled continue present management. 3.  Erectile dysfunction: Plan as outlined above we will initiate Viagra therapy. 4.  Coronary artery disease stable from that point of view.  We will schedule him to have echocardiogram  COVID-19 Education: The signs and symptoms of COVID-19 were discussed with the patient and how to seek care for testing (follow up with PCP or arrange E-visit).  The importance of social distancing was discussed today.  Patient Risk:   After full review of this patients clinical status, I feel that they are at least moderate risk at this time.  Time:   Today, I have spent 15 minutes with the patient with telehealth technology discussing pt health issues.  I spent 5 minutes reviewing her chart before the visit.  Visit was finished at 11 AM.    Medication Adjustments/Labs and Tests Ordered: Current medicines are reviewed at length with the patient today.  Concerns regarding medicines are outlined above.  No orders of the defined types were placed in this encounter.  Medication changes: No orders of the defined types were placed in this encounter.    Disposition: Follow-up 3 months echocardiogram will be done Viagra will be started  Signed, Georgeanna Leaobert J. Meara Wiechman, MD, [facility] 02/25/2019 11:00 AM    [facility]

## 2019-02-25 NOTE — Assessment & Plan Note (Signed)
Decision today to treat with OMT was based on Physical Exam  After verbal consent patient was treated with HVLA, ME, FPR techniques in  thoracic, lumbar and sacral areas  Patient tolerated the procedure well with improvement in symptoms  Patient given exercises, stretches and lifestyle modifications  See medications in patient instructions if given  Patient will follow up in 4-6 weeks 

## 2019-02-25 NOTE — Addendum Note (Signed)
Addended by: Lita Mains on: 02/25/2019 11:32 AM   Modules accepted: Orders

## 2019-02-25 NOTE — Patient Instructions (Signed)
Medication Instructions:  Your physician has recommended you make the following change in your medication:   Take Sildenafil as need for erectile dysfunction: 25 mg once a day as needed for erectile dysfunction.   If you need a refill on your cardiac medications before your next appointment, please call your pharmacy.   Lab work: None.  If you have labs (blood work) drawn today and your tests are completely normal, you will receive your results only by: Marland Kitchen MyChart Message (if you have MyChart) OR . A paper copy in the mail If you have any lab test that is abnormal or we need to change your treatment, we will call you to review the results.  Testing/Procedures: None.   Follow-Up: At [facility], you and your health needs are our priority.  As part of our continuing mission to provide you with exceptional heart care, we have created designated Provider Care Teams.  These Care Teams include your primary Cardiologist (physician) and Advanced Practice Providers (APPs -  Physician Assistants and Nurse Practitioners) who all work together to provide you with the care you need, when you need it. . You will need a follow up appointment in 3 months.  Please call our office 2 months in advance to schedule this appointment.  You may see No primary care provider on file. or another member of our [facility] Provider   Any Other Special Instructions Will Be Listed Below (If Applicable).  Sildenafil tablets (Erectile Dysfunction) What is this medicine? SILDENAFIL (sil DEN a fil) is used to treat erection problems in men. This medicine may be used for other purposes; ask your health care provider or pharmacist if you have questions. COMMON BRAND NAME(S): Viagra What should I tell my health care provider before I take this medicine? They need to know if you have any of these conditions: -bleeding disorders -eye or vision problems, including a rare inherited eye disease called retinitis  pigmentosa -anatomical deformation of the penis, Peyronie's disease, or history of priapism (painful and prolonged erection) -heart disease, angina, a history of heart attack, irregular heart beats, or other heart problems -high or low blood pressure -history of blood diseases, like sickle cell anemia or leukemia -history of stomach bleeding -kidney disease -liver disease -stroke -an unusual or allergic reaction to sildenafil, other medicines, foods, dyes, or preservatives -pregnant or trying to get pregnant -breast-feeding How should I use this medicine? Take this medicine by mouth with a glass of water. Follow the directions on the prescription label. The dose is usually taken 1 hour before sexual activity. You should not take the dose more than once per day. Do not take your medicine more often than directed. Talk to your pediatrician regarding the use of this medicine in children. This medicine is not used in children for this condition. Overdosage: If you think you have taken too much of this medicine contact a poison control center or emergency room at once. NOTE: This medicine is only for you. Do not share this medicine with others. What if I miss a dose? This does not apply. Do not take double or extra doses. What may interact with this medicine? Do not take this medicine with any of the following medications: -cisapride -nitrates like amyl nitrite, isosorbide dinitrate, isosorbide mononitrate, nitroglycerin -riociguat This medicine may also interact with the following medications: -antiviral medicines for HIV or AIDS -bosentan -certain medicines for benign prostatic hyperplasia (BPH) -certain medicines for blood pressure -certain medicines for fungal infections like ketoconazole and itraconazole -  cimetidine -erythromycin -rifampin This list may not describe all possible interactions. Give your health care provider a list of all the medicines, herbs, non-prescription drugs,  or dietary supplements you use. Also tell them if you smoke, drink alcohol, or use illegal drugs. Some items may interact with your medicine. What should I watch for while using this medicine? If you notice any changes in your vision while taking this drug, call your doctor or health care professional as soon as possible. Stop using this medicine and call your health care provider right away if you have a loss of sight in one or both eyes. Contact your doctor or health care professional right away if you have an erection that lasts longer than 4 hours or if it becomes painful. This may be a sign of a serious problem and must be treated right away to prevent permanent damage. If you experience symptoms of nausea, dizziness, chest pain or arm pain upon initiation of sexual activity after taking this medicine, you should refrain from further activity and call your doctor or health care professional as soon as possible. Do not drink alcohol to excess (examples, 5 glasses of wine or 5 shots of whiskey) when taking this medicine. When taken in excess, alcohol can increase your chances of getting a headache or getting dizzy, increasing your heart rate or lowering your blood pressure. Using this medicine does not protect you or your partner against HIV infection (the virus that causes AIDS) or other sexually transmitted diseases. What side effects may I notice from receiving this medicine? Side effects that you should report to your doctor or health care professional as soon as possible: -allergic reactions like skin rash, itching or hives, swelling of the face, lips, or tongue -breathing problems -changes in hearing -changes in vision -chest pain -fast, irregular heartbeat -prolonged or painful erection -seizures Side effects that usually do not require medical attention (report to your doctor or health care professional if they continue or are bothersome): -back  pain -dizziness -flushing -headache -indigestion -muscle aches -nausea -stuffy or runny nose This list may not describe all possible side effects. Call your doctor for medical advice about side effects. You may report side effects to FDA at 1-800-FDA-1088. Where should I keep my medicine? Keep out of reach of children. Store at room temperature between 15 and 30 degrees C (59 and 86 degrees F). Throw away any unused medicine after the expiration date. NOTE: This sheet is a summary. It may not cover all possible information. If you have questions about this medicine, talk to your doctor, pharmacist, or health care provider.  2019 Elsevier/Gold Standard (2015-08-26 12:00:25)

## 2019-02-25 NOTE — Patient Instructions (Signed)
Good to see you Take colchicine 2x day for 5 days Tart cherry 1500 mg at night Xray downstairs See me in 4-5 weeks

## 2019-02-25 NOTE — Assessment & Plan Note (Signed)
Discussed posture and ergonomics.  Discussed which activities of doing which wants to avoid.  Patient is to increase activity slowly.  Discussed core stability techniques.  Responded well to manipulation.  Follow-up again in 4 to 6 weeks

## 2019-02-25 NOTE — Assessment & Plan Note (Signed)
Continues to have pain in the middle finger.  Discussed with patient about x-rays.  I am concerned that patient could potentially have a smoldering gout as well.  Started on a low dose of colchicine for 5 days.  Warned of potential side effects.  Tart cherry extract recommended.  X-rays pending.  Follow-up again in 4 to 5 weeks otherwise.

## 2019-03-23 NOTE — Assessment & Plan Note (Signed)
Well with osteopathic manipulation.  Encourage patient to possibility of weight loss.  Patient does not want to do health and wellness.  Discussed icing regimen and home exercise.  Discussed

## 2019-03-23 NOTE — Progress Notes (Signed)
Tawana ScaleZach Elmar Antigua D.O. [facility] [address] [place], [place] [address] Phone: [phone] Subjective:   I Tim NighKana Thompson am serving as a Neurosurgeonscribe for Dr. Antoine PrimasZachary Solae Norling.   CC: Neck pain and neck pain follow-up  BJY:NWGNFAOZHYHPI:Subjective  Tim FleetGeorge H Cook is a 58 y.o. male coming in with complaint of neck pain. States that he is sore today.  Patient continues to have some back pain on a regular basis.  Describes it as a dull, throbbing aching sensation.  Nothing that stops him from activity.  Patient likes usually more about topical medicines.  Has tried some over-the-counter wants to see if he can get a prescription for the Voltaren.  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  Seem to be more gout related.  Patient did take colchicine and noticed improvement initially but then the swelling seemed to come back.  Has noticed some association with diet potentially indefinitely activity     Past Medical History:  Diagnosis Date  . Arthritis    "knees, lower back, hands" (05/18/2016)  . CAD (coronary artery disease), native coronary artery    05/18/16  Cath severe 90% LAD prox, 99% mid, Circ dominant with 30% OM1, 50% OM2, 30% OM3, 60% distal circ and PL, severe stenosis nondominant RCA.  EF 55%  3.0x 20, 2.5 x 20 mm, 2.25 x 16 mm Promus premier sent to LAD Dr. Clifton JamesMcALhany   . Chronic bronchitis (HCC)    "most years" (05/18/2016)  . Chronic lower back pain   . DDD (degenerative disc disease), lumbar   . Hyperlipidemia   . Hypertension   . Sleep apnea    "dx'd; never wore mask" (05/18/2016)  . Squamous carcinoma    cut off right upper arm, left shoulder; froze off nose and right side of face  . Type II diabetes mellitus (HCC)   . Unstable angina (HCC)   . Vomiting    Past Surgical History:  Procedure Laterality Date  . CARDIAC CATHETERIZATION N/A 05/18/2016   Procedure: Left Heart Cath and Coronary Angiography;  Surgeon: Kathleene Hazelhristopher D McAlhany, MD;  [facility];  Service: Cardiovascular;  Laterality: N/A;  . CARDIAC CATHETERIZATION N/A 05/18/2016   Procedure: Coronary Stent Intervention;  Surgeon: Kathleene Hazelhristopher D McAlhany, MD;  Location: [facility] INVASIVE CV LAB;  Service: Cardiovascular;  Laterality: N/A;  . CORONARY ANGIOPLASTY    . FOOT FRACTURE SURGERY Right 1970s  . LAPAROSCOPIC GASTRIC BANDING  07/2007   by Dr. Daphine DeutscherMartin  . PILONIDAL CYST EXCISION  ~ 1985   "took 8 out all at once"  . SQUAMOUS CELL CARCINOMA EXCISION     right upper arm, left shoulder   Social History   Socioeconomic History  . Marital status: Married    Spouse name: Not on file  . Number of children: Not on file  . Years of education: Not on file  . Highest education level: Not on file  Occupational History  . Not on file  Social Needs  . Financial resource strain: Not on file  . Food insecurity    Worry: Not on file    Inability: Not on file  . Transportation needs    Medical: Not on file    Non-medical: Not on file  Tobacco Use  . Smoking status: Former Smoker    Packs/day: 0.50    Years: 12.00    Pack years: 6.00    Types: Cigarettes    Quit date: 12/15/1991    Years  since quitting: 27.2  . Smokeless tobacco: Former Systems developer    Types: Snuff  . Tobacco comment: "quit snuff in ~ 1981"  Substance and Sexual Activity  . Alcohol use: Yes    Alcohol/week: 7.0 standard drinks    Types: 7 Cans of beer per week    Comment: More social use  . Drug use: No  . Sexual activity: Yes  Lifestyle  . Physical activity    Days per week: Not on file    Minutes per session: Not on file  . Stress: Not on file  Relationships  . Social Herbalist on phone: Not on file    Gets together: Not on file    Attends religious service: Not on file    Active member of club or organization: Not on file    Attends meetings of clubs or organizations: Not on file    Relationship status: Not on file  Other Topics Concern  . Not on file  Social History Narrative   . Not on file   No Known Allergies Family History  Problem Relation Age of Onset  . Heart disease Mother   . Heart disease Father   . Stroke Father   . Heart attack Father   . Heart disease Sister   . Diabetes Mellitus I Sister   . Stroke Brother     Current Outpatient Medications (Endocrine & Metabolic):  Marland Kitchen  FARXIGA 5 MG TABS tablet, Take 1 tablet by mouth daily. .  metFORMIN (GLUCOPHAGE) 500 MG tablet, Take 1,000 mg by mouth 2 (two) times daily with a meal.   Current Outpatient Medications (Cardiovascular):  .  atorvastatin (LIPITOR) 40 MG tablet,  .  losartan (COZAAR) 100 MG tablet, 100 mg.  .  nitroGLYCERIN (NITROSTAT) 0.4 MG SL tablet, Place 1 tablet (0.4 mg total) under the tongue every 5 (five) minutes as needed for chest pain. Please give 90 day supply in the small bottles .  sildenafil (VIAGRA) 25 MG tablet, Take 1 tablet (25 mg total) by mouth daily as needed for erectile dysfunction.  Current Outpatient Medications (Respiratory):  .  loratadine (CLARITIN) 10 MG tablet, Take 10 mg by mouth daily.  Current Outpatient Medications (Analgesics):  .  aspirin EC 81 MG tablet, Take 81 mg by mouth daily. .  colchicine 0.6 MG tablet, Take 1 tablet (0.6 mg total) by mouth 2 (two) times daily.   Current Outpatient Medications (Other):  Marland Kitchen  Multiple Vitamin (MULTIVITAMIN) capsule, Take 1 capsule by mouth daily. .  Vitamin D, Ergocalciferol, (DRISDOL) 50000 units CAPS capsule, Take 1 capsule (50,000 Units total) by mouth every 7 (seven) days. .  diclofenac sodium (VOLTAREN) 1 % GEL, Apply 2 g topically 4 (four) times daily. To affected joint.    Past medical history, social, surgical and family history all reviewed in electronic medical record.  No pertanent information unless stated regarding to the chief complaint.   Review of Systems:  No headache, visual changes, nausea, vomiting, diarrhea, constipation, dizziness, abdominal pain, skin rash, fevers, chills, night sweats,  weight loss, swollen lymph nodes, body aches, joint swelling, chest pain, shortness of breath, mood changes.  Positive muscle aches  Objective  Blood pressure 130/70, pulse 89, height 5\' 8"  (1.727 m), weight 248 lb (112.5 kg), SpO2 98 %.    General: No apparent distress alert and oriented x3 mood and affect normal, dressed appropriately.  HEENT: Pupils equal, extraocular movements intact  Respiratory: Patient's speak in full sentences and does not  appear short of breath  Cardiovascular: No lower extremity edema, non tender, no erythema  Skin: Warm dry intact with no signs of infection or rash on extremities or on axial skeleton.  Abdomen: Soft nontender  Neuro: Cranial nerves II through XII are intact, neurovascularly intact in all extremities with 2+ DTRs and 2+ pulses.  Lymph: No lymphadenopathy of posterior or anterior cervical chain or axillae bilaterally.  Gait normal with good balance and coordination.  MSK:  Non tender with full range of motion and good stability and symmetric strength and tone of shoulders, elbows, wrist, hip, knee and ankles bilaterally.  Neck: Inspection mild loss of lordosis. No palpable stepoffs. Negative Spurling's maneuver. Full neck range of motion Grip strength and sensation normal in bilateral hands Strength good C4 to T1 distribution No sensory change to C4 to T1 Negative Hoffman sign bilaterally Reflexes normal Tightness in the trapezius bilaterally right greater than left  Back Exam:  Inspection: Unremarkable  Motion: Flexion 45 deg, Extension 25 deg, Side Bending to 35 deg bilaterally,  Rotation to 25 deg bilaterally  SLR laying: Negative  XSLR laying: Negative  Palpable tenderness: Tender to palpation of paraspinal musculature lumbar spine right greater than left. FABER: negative. Sensory change: Gross sensation intact to all lumbar and sacral dermatomes.  Reflexes: 2+ at both patellar tendons, 2+ at achilles tendons, Babinski's downgoing.   Strength at foot  Plantar-flexion: 5/5 Dorsi-flexion: 5/5 Eversion: 5/5 Inversion: 5/5  Leg strength  Quad: 5/5 Hamstring: 5/5 Hip flexor: 5/5 Hip abductors: 5/5  Gait unremarkable.  Osteopathic findings    T9 extended rotated and side bent left L2 flexed rotated and side bent right Sacrum right on right    Impression and Recommendations:     This case required medical decision making of moderate complexity. The above documentation has been reviewed and is accurate and complete Judi SaaZachary M Merlen Gurry, DO       Note: This dictation was prepared with Dragon dictation along with smaller phrase technology. Any transcriptional errors that result from this process are unintentional.

## 2019-03-25 ENCOUNTER — Ambulatory Visit: Payer: BLUE CROSS/BLUE SHIELD | Admitting: Family Medicine

## 2019-03-25 ENCOUNTER — Encounter: Payer: Self-pay | Admitting: Family Medicine

## 2019-03-25 ENCOUNTER — Other Ambulatory Visit: Payer: Self-pay

## 2019-03-25 VITALS — BP 130/70 | HR 89 | Ht 68.0 in | Wt 248.0 lb

## 2019-03-25 DIAGNOSIS — M999 Biomechanical lesion, unspecified: Secondary | ICD-10-CM | POA: Diagnosis not present

## 2019-03-25 DIAGNOSIS — M5136 Other intervertebral disc degeneration, lumbar region: Secondary | ICD-10-CM

## 2019-03-25 MED ORDER — DICLOFENAC SODIUM 1 % TD GEL: 2.0000 g | Freq: Four times a day (QID) | TRANSDERMAL | 11 refills | Status: DC

## 2019-03-25 NOTE — Patient Instructions (Addendum)
Good to see you  Ice is yoru friend  Read about allopurinol  See me again in 6 weeks

## 2019-03-25 NOTE — Assessment & Plan Note (Signed)
Decision today to treat with OMT was based on Physical Exam  After verbal consent patient was treated with HVLA, ME, FPR techniques in  thoracic, lumbar and sacral areas  Patient tolerated the procedure well with improvement in symptoms  Patient given exercises, stretches and lifestyle modifications  See medications in patient instructions if given  Patient will follow up in 4 weeks 

## 2019-03-27 ENCOUNTER — Other Ambulatory Visit: Payer: Self-pay

## 2019-03-27 ENCOUNTER — Emergency Department (HOSPITAL_COMMUNITY)
Admission: EM | Admit: 2019-03-27 | Discharge: 2019-03-28 | Disposition: A | Discharge status: Home / Self Care | Payer: BC Managed Care – PPO | Attending: Emergency Medicine | Admitting: Emergency Medicine

## 2019-03-27 DIAGNOSIS — S2241XA Multiple fractures of ribs, right side, initial encounter for closed fracture: Secondary | ICD-10-CM

## 2019-03-27 DIAGNOSIS — Z7982 Long term (current) use of aspirin: Secondary | ICD-10-CM | POA: Diagnosis not present

## 2019-03-27 DIAGNOSIS — S2231XA Fracture of one rib, right side, initial encounter for closed fracture: Secondary | ICD-10-CM | POA: Diagnosis not present

## 2019-03-27 DIAGNOSIS — S42211A Unspecified displaced fracture of surgical neck of right humerus, initial encounter for closed fracture: Secondary | ICD-10-CM | POA: Diagnosis not present

## 2019-03-27 DIAGNOSIS — I10 Essential (primary) hypertension: Secondary | ICD-10-CM | POA: Diagnosis not present

## 2019-03-27 DIAGNOSIS — Z87891 Personal history of nicotine dependence: Secondary | ICD-10-CM | POA: Insufficient documentation

## 2019-03-27 DIAGNOSIS — Y999 Unspecified external cause status: Secondary | ICD-10-CM | POA: Diagnosis not present

## 2019-03-27 DIAGNOSIS — E119 Type 2 diabetes mellitus without complications: Secondary | ICD-10-CM | POA: Insufficient documentation

## 2019-03-27 DIAGNOSIS — Y939 Activity, unspecified: Secondary | ICD-10-CM | POA: Insufficient documentation

## 2019-03-27 DIAGNOSIS — Z7984 Long term (current) use of oral hypoglycemic drugs: Secondary | ICD-10-CM | POA: Diagnosis not present

## 2019-03-27 DIAGNOSIS — Z79899 Other long term (current) drug therapy: Secondary | ICD-10-CM | POA: Diagnosis not present

## 2019-03-27 DIAGNOSIS — W010XXA Fall on same level from slipping, tripping and stumbling without subsequent striking against object, initial encounter: Secondary | ICD-10-CM | POA: Insufficient documentation

## 2019-03-27 DIAGNOSIS — Y929 Unspecified place or not applicable: Secondary | ICD-10-CM | POA: Diagnosis not present

## 2019-03-27 DIAGNOSIS — I2510 Atherosclerotic heart disease of native coronary artery without angina pectoris: Secondary | ICD-10-CM | POA: Insufficient documentation

## 2019-03-27 DIAGNOSIS — M25511 Pain in right shoulder: Secondary | ICD-10-CM | POA: Diagnosis not present

## 2019-03-27 DIAGNOSIS — S4991XA Unspecified injury of right shoulder and upper arm, initial encounter: Secondary | ICD-10-CM | POA: Diagnosis not present

## 2019-03-27 DIAGNOSIS — S42201A Unspecified fracture of upper end of right humerus, initial encounter for closed fracture: Secondary | ICD-10-CM | POA: Diagnosis not present

## 2019-03-27 LAB — CBG MONITORING, ED: Glucose-Capillary: 187 mg/dL — ABNORMAL HIGH (ref 70–99)

## 2019-03-27 MED ORDER — OXYCODONE-ACETAMINOPHEN 5-325 MG PO TABS
2.0000 | ORAL_TABLET | Freq: Once | ORAL | Status: AC
  Administered 2019-03-27: 2 via ORAL
  Filled 2019-03-27: qty 2

## 2019-03-27 NOTE — ED Triage Notes (Signed)
Pt sent from [facility] for fx right humeral neck.  Pt fell off ladder from 6--8', landed on right shoulder.   Xray right shoulder impression:  Comminuted fx of the proximal right humerus; mildly displaced fx of the right 4th an 7th ribs.  [facility] sent CD of xray with patient.

## 2019-03-27 NOTE — ED Notes (Signed)
ED Provider at bedside. 

## 2019-03-27 NOTE — ED Notes (Signed)
Pt asked about something to eat/drink. Checked pt CBG pt stated he is a diabetic and hasn't had anything to eat or drink since 2 o'clock. CBG is 187. Pt informed that we had to wait till the EDP saw him to see what the plan is. Pt expressed understanding

## 2019-03-28 ENCOUNTER — Emergency Department (HOSPITAL_COMMUNITY): Payer: BC Managed Care – PPO

## 2019-03-28 DIAGNOSIS — S42201A Unspecified fracture of upper end of right humerus, initial encounter for closed fracture: Secondary | ICD-10-CM | POA: Diagnosis not present

## 2019-03-28 MED ORDER — OXYCODONE-ACETAMINOPHEN 5-325 MG PO TABS: 1.0000 | ORAL_TABLET | Freq: Four times a day (QID) | ORAL | 0 refills | Status: DC | PRN

## 2019-03-28 NOTE — ED Notes (Signed)
Discharge instructions, pain management, and follow up care discussed with pt. Pt verbalized understanding. No questions at this time. Pt. To go home with wife.

## 2019-03-28 NOTE — ED Notes (Signed)
Patient transported to X-ray 

## 2019-03-28 NOTE — ED Provider Notes (Signed)
[facility] Provider Note   CSN: [id number] Arrival date & time: 03/27/19  1857     History   Chief Complaint Chief Complaint  Patient presents with   Shoulder Injury    HPI Tim Cook is a 58 y.o. male.     HPI Patient presents to the emergency department with injuries following a fall.  The patient states that seen at a walk-in clinic and they did x-rays and found that he had a fracture of his humerus.  They also noted that he had 2 rib fractures.  Patient states that he was sent here for further evaluation and care of this fracture.  Patient states that he does not have any other complaints at this time.  He states he did not hit his head and has no neck pain.  Patient states that not take any medications prior to arrival for his symptoms.  Patient denies numbness, weakness, dizziness, headache, blurred vision, nausea, vomiting, near-syncope or syncope. Past Medical History:  Diagnosis Date   Arthritis    "knees, lower back, hands" (05/18/2016)   CAD (coronary artery disease), native coronary artery    05/18/16  Cath severe 90% LAD prox, 99% mid, Circ dominant with 30% OM1, 50% OM2, 30% OM3, 60% distal circ and PL, severe stenosis nondominant RCA.  EF 55%  3.0x 20, 2.5 x 20 mm, 2.25 x 16 mm Promus premier sent to [facility] Dr. Clifton JamesMcALhany    Chronic bronchitis [facility](HCC)    "most years" (05/18/2016)   Chronic lower back pain    DDD (degenerative disc disease), lumbar    Hyperlipidemia    Hypertension    Sleep apnea    "dx'd; never wore mask" (05/18/2016)   Squamous carcinoma    cut off right upper arm, left shoulder; froze off nose and right side of face   Type II diabetes mellitus (HCC)    Unstable angina (HCC)    Vomiting     Patient Active Problem List   Diagnosis Date Noted   Dislocation of left middle finger 01/21/2019   Bursitis of right shoulder 01/21/2019   Degenerative arthritis of right knee 05/09/2017    Nonallopathic lesion of lumbosacral region 07/07/2016   Nonallopathic lesion of sacral region 07/07/2016   Nonallopathic lesion of thoracic region 07/07/2016   Degenerative disc disease, lumbar 07/07/2016   Acute lateral meniscal tear 06/09/2016   Bariatric surgery status 05/18/2016   Hyperlipidemia 05/18/2016   Unstable angina (HCC)    CAD (coronary artery disease), native coronary artery    Hypertensive heart disease without CHF    Controlled type 2 diabetes with retinopathy (HCC)    Obesity (BMI 30-39.9)     Past Surgical History:  Procedure Laterality Date   CARDIAC CATHETERIZATION N/A 05/18/2016   Procedure: Left Heart Cath and Coronary Angiography;  Surgeon: Kathleene Hazelhristopher D McAlhany, MD;  [facility];  Service: Cardiovascular;  Laterality: N/A;   CARDIAC CATHETERIZATION N/A 05/18/2016   Procedure: Coronary Stent Intervention;  Surgeon: Kathleene Hazelhristopher D McAlhany, MD;  [facility];  Service: Cardiovascular;  Laterality: N/A;   CORONARY ANGIOPLASTY     FOOT FRACTURE SURGERY Right 1970s   LAPAROSCOPIC GASTRIC BANDING  07/2007   by Dr. Daphine DeutscherMartin   PILONIDAL CYST EXCISION  ~ 1985   "took 8 out all at once"   SQUAMOUS CELL CARCINOMA EXCISION     right upper arm, left shoulder        Home Medications  Prior to Admission medications   Medication Sig Start Date End Date Taking? Authorizing Provider  aspirin EC 81 MG tablet Take 81 mg by mouth daily.    [provider]  atorvastatin (LIPITOR) 40 MG tablet  05/02/16   [provider]  colchicine 0.6 MG tablet Take 1 tablet (0.6 mg total) by mouth 2 (two) times daily. 02/25/19   Tim Cook, Tim M, DO  diclofenac sodium (VOLTAREN) 1 % GEL Apply 2 g topically 4 (four) times daily. To affected joint. 03/25/19   Tim Cook, Tim M, DO  FARXIGA 5 MG TABS tablet Take 1 tablet by mouth daily. 01/06/19   [provider]  loratadine (CLARITIN) 10 MG tablet Take 10 mg by mouth  daily. 01/07/17   [provider]  losartan (COZAAR) 100 MG tablet 100 mg.  05/02/16   [provider]  metFORMIN (GLUCOPHAGE) 500 MG tablet Take 1,000 mg by mouth 2 (two) times daily with a meal.  05/02/16   [provider]  Multiple Vitamin (MULTIVITAMIN) capsule Take 1 capsule by mouth daily.    [provider]  nitroGLYCERIN (NITROSTAT) 0.4 MG SL tablet Place 1 tablet (0.4 mg total) under the tongue every 5 (five) minutes as needed for chest pain. Please give 90 day supply in the small bottles 12/07/18   Revankar, Aundra Dubinajan R, MD  oxyCODONE-acetaminophen (PERCOCET/ROXICET) 5-325 MG tablet Take 1 tablet by mouth every 6 (six) hours as needed for severe pain. 03/28/19   Zyhir Cook, Tim Deerhristopher, PA-C  sildenafil (VIAGRA) 25 MG tablet Take 1 tablet (25 mg total) by mouth daily as needed for erectile dysfunction. 02/25/19   Georgeanna Cook, Tim J, MD  Vitamin D, Ergocalciferol, (DRISDOL) 50000 units CAPS capsule Take 1 capsule (50,000 Units total) by mouth every 7 (seven) days. 03/14/18   Tim Cook, Tim M, DO    Family History Family History  Problem Relation Age of Onset   Heart disease Mother    Heart disease Father    Stroke Father    Heart attack Father    Heart disease Sister    Diabetes Mellitus I Sister    Stroke Brother     Social History Social History   Tobacco Use   Smoking status: Former Smoker    Packs/day: 0.50    Years: 12.00    Pack years: 6.00    Types: Cigarettes    Quit date: 12/15/1991    Years since quitting: 27.3   Smokeless tobacco: Former NeurosurgeonUser    Types: Snuff   Tobacco comment: "quit snuff in ~ 1981"  Substance Use Topics   Alcohol use: Yes    Alcohol/week: 7.0 standard drinks    Types: 7 Cans of beer per week    Comment: More social use   Drug use: No     Allergies   Patient has no known allergies.   Review of Systems Review of Systems All other systems negative except as documented in the HPI. All pertinent positives  and negatives as reviewed in the HPI.  Physical Exam Updated Vital Signs BP 112/73    Pulse 99    Temp 98.2 F (36.8 C)    Resp 19    SpO2 97%   Physical Exam Vitals signs and nursing note reviewed.  Constitutional:      General: He is not in acute distress.    Appearance: He is well-developed.  HENT:     Head: Normocephalic and atraumatic.  Eyes:     Pupils: Pupils are equal, round, and reactive  to light.  Cardiovascular:     Rate and Rhythm: Normal rate and regular rhythm.     Pulses: Normal pulses.     Heart sounds: Normal heart sounds. No murmur. No friction rub. No gallop.   Pulmonary:     Effort: Pulmonary effort is normal. No respiratory distress.     Breath sounds: Normal breath sounds. No stridor. No wheezing or rhonchi.  Chest:     Chest wall: Tenderness present.  Musculoskeletal:     Right shoulder: He exhibits decreased range of motion, tenderness, bony tenderness, pain and spasm. He exhibits normal pulse.       Arms:  Skin:    General: Skin is warm and dry.  Neurological:     General: No focal deficit present.     Mental Status: He is alert and oriented to person, place, and time.     Motor: No weakness.     Coordination: Coordination normal.     Gait: Gait normal.      ED Treatments / Results  Labs (all labs ordered are listed, but only abnormal results are displayed) Labs Reviewed  CBG MONITORING, ED - Abnormal; Notable for the following components:      Result Value   Glucose-Capillary 187 (*)    All other components within normal limits    EKG None  Radiology Ct Shoulder Right Wo Contrast  Result Date: 03/28/2019 CLINICAL DATA:  Fall off ladder. Questionable scapular fracture on plain film EXAM: CT OF THE UPPER RIGHT EXTREMITY WITHOUT CONTRAST TECHNIQUE: Multidetector CT imaging of the upper right extremity was performed according to the standard protocol. COMPARISON:  Plain film 03/27/2019 FINDINGS: Comminuted fracture noted through the right  the proximal humerus involving the humeral neck with mildly displaced fracture fragments. No subluxation or dislocation. No visible scapular fracture. Fracture through the lateral right 4th rib again noted as seen on plain films. IMPRESSION: Comminuted proximal right humeral fracture. Right lateral 4th rib fracture. No visible scapular fracture. Electronically Signed   By: Charlett NoseKevin  Dover M.D.   On: 03/28/2019 00:40    Procedures Procedures (including critical care time)  Medications Ordered in ED Medications  oxyCODONE-acetaminophen (PERCOCET/ROXICET) 5-325 MG per tablet 2 tablet (2 tablets Oral Given 03/27/19 2321)     Initial Impression / Assessment and Plan / ED Course  I have reviewed the triage vital signs and the nursing notes.  Pertinent labs & imaging results that were available during my care of the patient were reviewed by me and considered in my medical decision making (see chart for details).       Patient will be treated with sling and pain control.  Have advised him he will need to follow-up with orthopedics.  He is given the name of the orthopedist and told to return here for any worsening in his condition.  CT scan was obtained due to the fact that the patient was having significant scapular pain and could not do the imaging for this due to his fracture.  Final Clinical Impressions(s) / ED Diagnoses   Final diagnoses:  Closed fracture of neck of right humerus, initial encounter  Closed fracture of multiple ribs of right side, initial encounter    ED Discharge Orders         Ordered    oxyCODONE-acetaminophen (PERCOCET/ROXICET) 5-325 MG tablet  Every 6 hours PRN     03/28/19 0100           Charlestine NightLawyer, Aashi Derrington, PA-C 03/28/19 [id number]    Jacalyn LefevreHaviland, Julie,  MD 04/03/19 [id number]

## 2019-03-28 NOTE — Discharge Instructions (Signed)
Follow-up with the orthopedist provided.  The CT scan did not show any scapular fracture.  You can apply ice to your shoulder.

## 2019-04-01 DIAGNOSIS — S42291A Other displaced fracture of upper end of right humerus, initial encounter for closed fracture: Secondary | ICD-10-CM | POA: Diagnosis not present

## 2019-04-08 DIAGNOSIS — S42291D Other displaced fracture of upper end of right humerus, subsequent encounter for fracture with routine healing: Secondary | ICD-10-CM | POA: Diagnosis not present

## 2019-04-15 DIAGNOSIS — Z4651 Encounter for fitting and adjustment of gastric lap band: Secondary | ICD-10-CM | POA: Diagnosis not present

## 2019-05-03 ENCOUNTER — Other Ambulatory Visit: Payer: Self-pay

## 2019-05-03 MED ORDER — NITROGLYCERIN 0.4 MG SL SUBL: 0.4000 mg | SUBLINGUAL_TABLET | SUBLINGUAL | 3 refills | Status: DC | PRN

## 2019-05-05 ENCOUNTER — Other Ambulatory Visit: Payer: Self-pay | Admitting: Family Medicine

## 2019-05-06 DIAGNOSIS — S42291D Other displaced fracture of upper end of right humerus, subsequent encounter for fracture with routine healing: Secondary | ICD-10-CM | POA: Diagnosis not present

## 2019-05-13 DIAGNOSIS — M6281 Muscle weakness (generalized): Secondary | ICD-10-CM | POA: Diagnosis not present

## 2019-05-13 DIAGNOSIS — M25511 Pain in right shoulder: Secondary | ICD-10-CM | POA: Diagnosis not present

## 2019-05-13 DIAGNOSIS — S42201D Unspecified fracture of upper end of right humerus, subsequent encounter for fracture with routine healing: Secondary | ICD-10-CM | POA: Diagnosis not present

## 2019-05-13 DIAGNOSIS — W11XXXD Fall on and from ladder, subsequent encounter: Secondary | ICD-10-CM | POA: Diagnosis not present

## 2019-05-14 DIAGNOSIS — E782 Mixed hyperlipidemia: Secondary | ICD-10-CM | POA: Diagnosis not present

## 2019-05-14 DIAGNOSIS — I10 Essential (primary) hypertension: Secondary | ICD-10-CM | POA: Diagnosis not present

## 2019-05-14 DIAGNOSIS — K219 Gastro-esophageal reflux disease without esophagitis: Secondary | ICD-10-CM | POA: Diagnosis not present

## 2019-05-14 DIAGNOSIS — E1169 Type 2 diabetes mellitus with other specified complication: Secondary | ICD-10-CM | POA: Diagnosis not present

## 2019-05-16 DIAGNOSIS — W11XXXD Fall on and from ladder, subsequent encounter: Secondary | ICD-10-CM | POA: Diagnosis not present

## 2019-05-16 DIAGNOSIS — M6281 Muscle weakness (generalized): Secondary | ICD-10-CM | POA: Diagnosis not present

## 2019-05-16 DIAGNOSIS — E782 Mixed hyperlipidemia: Secondary | ICD-10-CM | POA: Diagnosis not present

## 2019-05-16 DIAGNOSIS — E1169 Type 2 diabetes mellitus with other specified complication: Secondary | ICD-10-CM | POA: Diagnosis not present

## 2019-05-16 DIAGNOSIS — E559 Vitamin D deficiency, unspecified: Secondary | ICD-10-CM | POA: Diagnosis not present

## 2019-05-16 DIAGNOSIS — S42201D Unspecified fracture of upper end of right humerus, subsequent encounter for fracture with routine healing: Secondary | ICD-10-CM | POA: Diagnosis not present

## 2019-05-16 DIAGNOSIS — M25511 Pain in right shoulder: Secondary | ICD-10-CM | POA: Diagnosis not present

## 2019-05-20 DIAGNOSIS — M25511 Pain in right shoulder: Secondary | ICD-10-CM | POA: Diagnosis not present

## 2019-05-20 DIAGNOSIS — M6281 Muscle weakness (generalized): Secondary | ICD-10-CM | POA: Diagnosis not present

## 2019-05-20 DIAGNOSIS — S42201D Unspecified fracture of upper end of right humerus, subsequent encounter for fracture with routine healing: Secondary | ICD-10-CM | POA: Diagnosis not present

## 2019-05-20 DIAGNOSIS — W11XXXD Fall on and from ladder, subsequent encounter: Secondary | ICD-10-CM | POA: Diagnosis not present

## 2019-05-22 DIAGNOSIS — W11XXXD Fall on and from ladder, subsequent encounter: Secondary | ICD-10-CM | POA: Diagnosis not present

## 2019-05-22 DIAGNOSIS — M6281 Muscle weakness (generalized): Secondary | ICD-10-CM | POA: Diagnosis not present

## 2019-05-22 DIAGNOSIS — S42201D Unspecified fracture of upper end of right humerus, subsequent encounter for fracture with routine healing: Secondary | ICD-10-CM | POA: Diagnosis not present

## 2019-05-22 DIAGNOSIS — M25511 Pain in right shoulder: Secondary | ICD-10-CM | POA: Diagnosis not present

## 2019-05-24 ENCOUNTER — Ambulatory Visit: Payer: BC Managed Care – PPO | Admitting: Family Medicine

## 2019-05-27 ENCOUNTER — Telehealth: Payer: BC Managed Care – PPO | Admitting: Cardiology

## 2019-05-27 ENCOUNTER — Ambulatory Visit: Payer: BC Managed Care – PPO | Admitting: Family Medicine

## 2019-05-27 DIAGNOSIS — W11XXXD Fall on and from ladder, subsequent encounter: Secondary | ICD-10-CM | POA: Diagnosis not present

## 2019-05-27 DIAGNOSIS — M6281 Muscle weakness (generalized): Secondary | ICD-10-CM | POA: Diagnosis not present

## 2019-05-27 DIAGNOSIS — M25511 Pain in right shoulder: Secondary | ICD-10-CM | POA: Diagnosis not present

## 2019-05-27 DIAGNOSIS — S42201D Unspecified fracture of upper end of right humerus, subsequent encounter for fracture with routine healing: Secondary | ICD-10-CM | POA: Diagnosis not present

## 2019-05-29 DIAGNOSIS — S42201D Unspecified fracture of upper end of right humerus, subsequent encounter for fracture with routine healing: Secondary | ICD-10-CM | POA: Diagnosis not present

## 2019-05-29 DIAGNOSIS — M25511 Pain in right shoulder: Secondary | ICD-10-CM | POA: Diagnosis not present

## 2019-05-29 DIAGNOSIS — W11XXXD Fall on and from ladder, subsequent encounter: Secondary | ICD-10-CM | POA: Diagnosis not present

## 2019-05-29 DIAGNOSIS — M6281 Muscle weakness (generalized): Secondary | ICD-10-CM | POA: Diagnosis not present

## 2019-05-31 ENCOUNTER — Other Ambulatory Visit: Payer: Self-pay

## 2019-05-31 ENCOUNTER — Encounter: Payer: Self-pay | Admitting: Cardiology

## 2019-05-31 ENCOUNTER — Telehealth (INDEPENDENT_AMBULATORY_CARE_PROVIDER_SITE_OTHER): Payer: BC Managed Care – PPO | Admitting: Cardiology

## 2019-05-31 DIAGNOSIS — E669 Obesity, unspecified: Secondary | ICD-10-CM

## 2019-05-31 DIAGNOSIS — I25119 Atherosclerotic heart disease of native coronary artery with unspecified angina pectoris: Secondary | ICD-10-CM

## 2019-05-31 DIAGNOSIS — E7800 Pure hypercholesterolemia, unspecified: Secondary | ICD-10-CM

## 2019-05-31 DIAGNOSIS — I119 Hypertensive heart disease without heart failure: Secondary | ICD-10-CM

## 2019-05-31 NOTE — Progress Notes (Signed)
Virtual Visit via Video Note   This visit type was conducted due to national recommendations for restrictions regarding the COVID-19 Pandemic (e.g. social distancing) in an effort to limit this patient's exposure and mitigate transmission in our community.  Due to his co-morbid illnesses, this patient is at least at moderate risk for complications without adequate follow up.  This format is felt to be most appropriate for this patient at this time.  All issues noted in this document were discussed and addressed.  A limited physical exam was performed with this format.  Please refer to the patient's chart for his consent to telehealth for [facility].  Evaluation Performed:  Follow-up visit  This visit type was conducted due to national recommendations for restrictions regarding the COVID-19 Pandemic (e.g. social distancing).  This format is felt to be most appropriate for this patient at this time.  All issues noted in this document were discussed and addressed.  No physical exam was performed (except for noted visual exam findings with Video Visits).  Please refer to the patient's chart (MyChart message for video visits and phone note for telephone visits) for the patient's consent to telehealth for [facility].  Date:  05/31/2019  ID: [name], DOB 1961/09/06, MRN [id number]   Patient Location: [address] [place] [address]   Provider location:   [facility] [place] [facility]  PCP:  Wilfrid Lund, [place]  Cardiologist:  Gypsy Balsam, MD     Chief Complaint: Cardiac wise doing well  History of Present Illness:    NAIF HOBGOOD is a 58 y.o. male  who presents via audio/video conferencing for a telehealth visit today.  With history of coronary artery disease 2017 he got 90% LAD 99% mid LAD stenosis that required stenting, diabetes, sleep apnea, hypertension, hyperlipidemia does have a visit with me today.  He is doing well cardiac wise however he fell down the ladder  and broke his arm and 3 ribs.  Recovering from it.  Denies have any chest pain tightness squeezing pressure burning chest.  He is less active right now because of injury to that he sustained   The patient does not have symptoms concerning for COVID-19 infection (fever, chills, cough, or new SHORTNESS OF BREATH).    Prior CV studies:   The following studies were reviewed today:       Past Medical History:  Diagnosis Date   Arthritis    "knees, lower back, hands" (05/18/2016)   CAD (coronary artery disease), native coronary artery    05/18/16  Cath severe 90% LAD prox, 99% mid, Circ dominant with 30% OM1, 50% OM2, 30% OM3, 60% distal circ and PL, severe stenosis nondominant RCA.  EF 55%  3.0x 20, 2.5 x 20 mm, 2.25 x 16 mm Promus premier sent to LAD Dr. Clifton James    Chronic bronchitis [facility])    "most years" (05/18/2016)   Chronic lower back pain    DDD (degenerative disc disease), lumbar    Hyperlipidemia    Hypertension    Sleep apnea    "dx'd; never wore mask" (05/18/2016)   Squamous carcinoma    cut off right upper arm, left shoulder; froze off nose and right side of face   Type II diabetes mellitus (HCC)    Unstable angina (HCC)    Vomiting     Past Surgical History:  Procedure Laterality Date   CARDIAC CATHETERIZATION N/A 05/18/2016   Procedure: Left Heart Cath and Coronary Angiography;  Surgeon:  Burnell Blanks, MD;  Location: [place] [facility];  Service: Cardiovascular;  Laterality: N/A;   CARDIAC CATHETERIZATION N/A 05/18/2016   Procedure: Coronary Stent Intervention;  Surgeon: Burnell Blanks, MD;  Location: [place] [facility];  Service: Cardiovascular;  Laterality: N/A;   CORONARY ANGIOPLASTY     FOOT FRACTURE SURGERY Right 1970s   LAPAROSCOPIC GASTRIC BANDING  07/2007   by Dr. Hassell Done   PILONIDAL CYST EXCISION  ~ 1985   "took 8 out all at once"   SQUAMOUS CELL CARCINOMA EXCISION     right upper arm, left shoulder     Current Meds    Medication Sig   aspirin EC 81 MG tablet Take 81 mg by mouth daily.   atorvastatin (LIPITOR) 40 MG tablet    colchicine 0.6 MG tablet Take 1 tablet (0.6 mg total) by mouth 2 (two) times daily.   diclofenac sodium (VOLTAREN) 1 % GEL Apply 2 g topically 4 (four) times daily. To affected joint.   FARXIGA 5 MG TABS tablet Take 1 tablet by mouth daily.   loratadine (CLARITIN) 10 MG tablet Take 10 mg by mouth daily.   losartan (COZAAR) 100 MG tablet 100 mg.    metFORMIN (GLUCOPHAGE) 500 MG tablet Take 1,000 mg by mouth 2 (two) times daily with a meal.    Multiple Vitamin (MULTIVITAMIN) capsule Take 1 capsule by mouth daily.   nitroGLYCERIN (NITROSTAT) 0.4 MG SL tablet Place 1 tablet (0.4 mg total) under the tongue every 5 (five) minutes as needed for chest pain. Please give 90 day supply in the small bottles   oxyCODONE-acetaminophen (PERCOCET/ROXICET) 5-325 MG tablet Take 1 tablet by mouth every 6 (six) hours as needed for severe pain.   sildenafil (VIAGRA) 25 MG tablet Take 1 tablet (25 mg total) by mouth daily as needed for erectile dysfunction.   Vitamin D, Ergocalciferol, (DRISDOL) 1.25 MG (50000 UT) CAPS capsule TAKE 1 CAPSULE EVERY 7 DAYS      Family History: The patient's family history includes Diabetes Mellitus I in his sister; Heart attack in his father; Heart disease in his father, mother, and sister; Stroke in his brother and father.   ROS:   Please see the history of present illness.     All other systems reviewed and are negative.   Labs/Other Tests and Data Reviewed:     Recent Labs: 12/07/2018: ALT 22  Recent Lipid Panel    Component Value Date/Time   CHOL 125 12/07/2018 1520   TRIG 103 12/07/2018 1520   HDL 57 12/07/2018 1520   CHOLHDL 2.2 12/07/2018 1520   LDLCALC 47 12/07/2018 1520      Exam:    Vital Signs:  There were no vitals taken for this visit.    Wt Readings from Last 3 Encounters:  03/25/19 248 lb (112.5 kg)  02/25/19 242 lb  (109.8 kg)  02/25/19 247 lb (112 kg)     Well nourished, well developed in no acute distress. Alert awake and x3 talking to me from his living room.  I am in our office in hospital.  Not in any distress  Diagnosis for this visit:   1. Coronary artery disease involving native coronary artery with angina pectoris, unspecified whether native or transplanted heart ([place])   2. Pure hypercholesterolemia   3. Hypertensive heart disease without CHF   4. Obesity (BMI 30-39.9)      ASSESSMENT & PLAN:    1.  Coronary artery disease stable asymptomatic on appropriate medications which  will continue. Dyslipidemia last cholesterol profile check in March was excellent we will continue present management. 3.  Hypertension blood pressure well controlled continue present management. 4.  Diabetes slightly worse right now.  However he is working on it new medication has been started.  COVID-19 Education: The signs and symptoms of COVID-19 were discussed with the patient and how to seek care for testing (follow up with PCP or arrange E-visit).  The importance of social distancing was discussed today.  Patient Risk:   After full review of this patients clinical status, I feel that they are at least moderate risk at this time.  Time:   Today, I have spent 15 minutes with the patient with telehealth technology discussing pt health issues.  I spent 5 minutes reviewing her chart before the visit.  Visit was finished at 3 of.    Medication Adjustments/Labs and Tests Ordered: Current medicines are reviewed at length with the patient today.  Concerns regarding medicines are outlined above.  No orders of the defined types were placed in this encounter.  Medication changes: No orders of the defined types were placed in this encounter.    Disposition:   Follow-up in 5 months follow-up in 5 months  Signed, Georgeanna Leaobert J. Anastasia Tompson, MD, [facility] Center[facility]ACC 05/31/2019 3:04 PM    [facility]

## 2019-06-05 DIAGNOSIS — S42201D Unspecified fracture of upper end of right humerus, subsequent encounter for fracture with routine healing: Secondary | ICD-10-CM | POA: Diagnosis not present

## 2019-06-06 DIAGNOSIS — M25511 Pain in right shoulder: Secondary | ICD-10-CM | POA: Diagnosis not present

## 2019-06-06 DIAGNOSIS — M6281 Muscle weakness (generalized): Secondary | ICD-10-CM | POA: Diagnosis not present

## 2019-06-06 DIAGNOSIS — S42201D Unspecified fracture of upper end of right humerus, subsequent encounter for fracture with routine healing: Secondary | ICD-10-CM | POA: Diagnosis not present

## 2019-06-06 DIAGNOSIS — W11XXXD Fall on and from ladder, subsequent encounter: Secondary | ICD-10-CM | POA: Diagnosis not present

## 2019-06-10 DIAGNOSIS — S42201D Unspecified fracture of upper end of right humerus, subsequent encounter for fracture with routine healing: Secondary | ICD-10-CM | POA: Diagnosis not present

## 2019-06-10 DIAGNOSIS — M6281 Muscle weakness (generalized): Secondary | ICD-10-CM | POA: Diagnosis not present

## 2019-06-10 DIAGNOSIS — W11XXXD Fall on and from ladder, subsequent encounter: Secondary | ICD-10-CM | POA: Diagnosis not present

## 2019-06-10 DIAGNOSIS — M25511 Pain in right shoulder: Secondary | ICD-10-CM | POA: Diagnosis not present

## 2019-06-12 DIAGNOSIS — W11XXXD Fall on and from ladder, subsequent encounter: Secondary | ICD-10-CM | POA: Diagnosis not present

## 2019-06-12 DIAGNOSIS — M25511 Pain in right shoulder: Secondary | ICD-10-CM | POA: Diagnosis not present

## 2019-06-12 DIAGNOSIS — S42201D Unspecified fracture of upper end of right humerus, subsequent encounter for fracture with routine healing: Secondary | ICD-10-CM | POA: Diagnosis not present

## 2019-06-12 DIAGNOSIS — M6281 Muscle weakness (generalized): Secondary | ICD-10-CM | POA: Diagnosis not present

## 2019-06-17 DIAGNOSIS — M6281 Muscle weakness (generalized): Secondary | ICD-10-CM | POA: Diagnosis not present

## 2019-06-17 DIAGNOSIS — W11XXXD Fall on and from ladder, subsequent encounter: Secondary | ICD-10-CM | POA: Diagnosis not present

## 2019-06-17 DIAGNOSIS — S42201D Unspecified fracture of upper end of right humerus, subsequent encounter for fracture with routine healing: Secondary | ICD-10-CM | POA: Diagnosis not present

## 2019-06-17 DIAGNOSIS — M25511 Pain in right shoulder: Secondary | ICD-10-CM | POA: Diagnosis not present

## 2019-06-19 DIAGNOSIS — W11XXXD Fall on and from ladder, subsequent encounter: Secondary | ICD-10-CM | POA: Diagnosis not present

## 2019-06-19 DIAGNOSIS — M25511 Pain in right shoulder: Secondary | ICD-10-CM | POA: Diagnosis not present

## 2019-06-19 DIAGNOSIS — D225 Melanocytic nevi of trunk: Secondary | ICD-10-CM | POA: Diagnosis not present

## 2019-06-19 DIAGNOSIS — L821 Other seborrheic keratosis: Secondary | ICD-10-CM | POA: Diagnosis not present

## 2019-06-19 DIAGNOSIS — M6281 Muscle weakness (generalized): Secondary | ICD-10-CM | POA: Diagnosis not present

## 2019-06-19 DIAGNOSIS — S42201D Unspecified fracture of upper end of right humerus, subsequent encounter for fracture with routine healing: Secondary | ICD-10-CM | POA: Diagnosis not present

## 2019-06-19 DIAGNOSIS — L820 Inflamed seborrheic keratosis: Secondary | ICD-10-CM | POA: Diagnosis not present

## 2019-06-19 DIAGNOSIS — Z85828 Personal history of other malignant neoplasm of skin: Secondary | ICD-10-CM | POA: Diagnosis not present

## 2019-06-21 DIAGNOSIS — Z23 Encounter for immunization: Secondary | ICD-10-CM | POA: Diagnosis not present

## 2019-06-24 DIAGNOSIS — M6281 Muscle weakness (generalized): Secondary | ICD-10-CM | POA: Diagnosis not present

## 2019-06-24 DIAGNOSIS — S42201D Unspecified fracture of upper end of right humerus, subsequent encounter for fracture with routine healing: Secondary | ICD-10-CM | POA: Diagnosis not present

## 2019-06-24 DIAGNOSIS — W11XXXD Fall on and from ladder, subsequent encounter: Secondary | ICD-10-CM | POA: Diagnosis not present

## 2019-06-24 DIAGNOSIS — M25511 Pain in right shoulder: Secondary | ICD-10-CM | POA: Diagnosis not present

## 2019-06-25 ENCOUNTER — Ambulatory Visit: Payer: BC Managed Care – PPO | Admitting: Family Medicine

## 2019-06-26 DIAGNOSIS — M6281 Muscle weakness (generalized): Secondary | ICD-10-CM | POA: Diagnosis not present

## 2019-06-26 DIAGNOSIS — M25511 Pain in right shoulder: Secondary | ICD-10-CM | POA: Diagnosis not present

## 2019-06-26 DIAGNOSIS — S42201D Unspecified fracture of upper end of right humerus, subsequent encounter for fracture with routine healing: Secondary | ICD-10-CM | POA: Diagnosis not present

## 2019-06-26 DIAGNOSIS — W11XXXD Fall on and from ladder, subsequent encounter: Secondary | ICD-10-CM | POA: Diagnosis not present

## 2019-07-03 DIAGNOSIS — J208 Acute bronchitis due to other specified organisms: Secondary | ICD-10-CM | POA: Diagnosis not present

## 2019-07-05 DIAGNOSIS — Z20828 Contact with and (suspected) exposure to other viral communicable diseases: Secondary | ICD-10-CM | POA: Diagnosis not present

## 2019-07-15 DIAGNOSIS — S42201D Unspecified fracture of upper end of right humerus, subsequent encounter for fracture with routine healing: Secondary | ICD-10-CM | POA: Diagnosis not present

## 2019-07-15 DIAGNOSIS — M25511 Pain in right shoulder: Secondary | ICD-10-CM | POA: Diagnosis not present

## 2019-07-15 DIAGNOSIS — W11XXXD Fall on and from ladder, subsequent encounter: Secondary | ICD-10-CM | POA: Diagnosis not present

## 2019-07-15 DIAGNOSIS — M6281 Muscle weakness (generalized): Secondary | ICD-10-CM | POA: Diagnosis not present

## 2019-07-17 DIAGNOSIS — M25511 Pain in right shoulder: Secondary | ICD-10-CM | POA: Diagnosis not present

## 2019-07-17 DIAGNOSIS — W11XXXD Fall on and from ladder, subsequent encounter: Secondary | ICD-10-CM | POA: Diagnosis not present

## 2019-07-17 DIAGNOSIS — S42201D Unspecified fracture of upper end of right humerus, subsequent encounter for fracture with routine healing: Secondary | ICD-10-CM | POA: Diagnosis not present

## 2019-07-17 DIAGNOSIS — M6281 Muscle weakness (generalized): Secondary | ICD-10-CM | POA: Diagnosis not present

## 2019-07-22 DIAGNOSIS — M6281 Muscle weakness (generalized): Secondary | ICD-10-CM | POA: Diagnosis not present

## 2019-07-22 DIAGNOSIS — M25511 Pain in right shoulder: Secondary | ICD-10-CM | POA: Diagnosis not present

## 2019-07-22 DIAGNOSIS — W11XXXD Fall on and from ladder, subsequent encounter: Secondary | ICD-10-CM | POA: Diagnosis not present

## 2019-07-22 DIAGNOSIS — S42201D Unspecified fracture of upper end of right humerus, subsequent encounter for fracture with routine healing: Secondary | ICD-10-CM | POA: Diagnosis not present

## 2019-07-23 ENCOUNTER — Ambulatory Visit: Payer: BC Managed Care – PPO | Admitting: Family Medicine

## 2019-07-24 DIAGNOSIS — M25511 Pain in right shoulder: Secondary | ICD-10-CM | POA: Diagnosis not present

## 2019-07-24 DIAGNOSIS — M6281 Muscle weakness (generalized): Secondary | ICD-10-CM | POA: Diagnosis not present

## 2019-07-24 DIAGNOSIS — S42201D Unspecified fracture of upper end of right humerus, subsequent encounter for fracture with routine healing: Secondary | ICD-10-CM | POA: Diagnosis not present

## 2019-07-24 DIAGNOSIS — W11XXXD Fall on and from ladder, subsequent encounter: Secondary | ICD-10-CM | POA: Diagnosis not present

## 2019-07-29 DIAGNOSIS — W11XXXD Fall on and from ladder, subsequent encounter: Secondary | ICD-10-CM | POA: Diagnosis not present

## 2019-07-29 DIAGNOSIS — M6281 Muscle weakness (generalized): Secondary | ICD-10-CM | POA: Diagnosis not present

## 2019-07-29 DIAGNOSIS — M25511 Pain in right shoulder: Secondary | ICD-10-CM | POA: Diagnosis not present

## 2019-07-29 DIAGNOSIS — S42201D Unspecified fracture of upper end of right humerus, subsequent encounter for fracture with routine healing: Secondary | ICD-10-CM | POA: Diagnosis not present

## 2019-07-31 DIAGNOSIS — M6281 Muscle weakness (generalized): Secondary | ICD-10-CM | POA: Diagnosis not present

## 2019-07-31 DIAGNOSIS — S42201D Unspecified fracture of upper end of right humerus, subsequent encounter for fracture with routine healing: Secondary | ICD-10-CM | POA: Diagnosis not present

## 2019-07-31 DIAGNOSIS — W11XXXD Fall on and from ladder, subsequent encounter: Secondary | ICD-10-CM | POA: Diagnosis not present

## 2019-07-31 DIAGNOSIS — M25511 Pain in right shoulder: Secondary | ICD-10-CM | POA: Diagnosis not present

## 2019-08-05 DIAGNOSIS — W11XXXD Fall on and from ladder, subsequent encounter: Secondary | ICD-10-CM | POA: Diagnosis not present

## 2019-08-05 DIAGNOSIS — S42201D Unspecified fracture of upper end of right humerus, subsequent encounter for fracture with routine healing: Secondary | ICD-10-CM | POA: Diagnosis not present

## 2019-08-05 DIAGNOSIS — M6281 Muscle weakness (generalized): Secondary | ICD-10-CM | POA: Diagnosis not present

## 2019-08-05 DIAGNOSIS — M25511 Pain in right shoulder: Secondary | ICD-10-CM | POA: Diagnosis not present

## 2019-08-07 DIAGNOSIS — S42201D Unspecified fracture of upper end of right humerus, subsequent encounter for fracture with routine healing: Secondary | ICD-10-CM | POA: Diagnosis not present

## 2019-08-07 DIAGNOSIS — M25511 Pain in right shoulder: Secondary | ICD-10-CM | POA: Diagnosis not present

## 2019-08-07 DIAGNOSIS — W11XXXD Fall on and from ladder, subsequent encounter: Secondary | ICD-10-CM | POA: Diagnosis not present

## 2019-08-07 DIAGNOSIS — M6281 Muscle weakness (generalized): Secondary | ICD-10-CM | POA: Diagnosis not present

## 2019-08-12 DIAGNOSIS — M6281 Muscle weakness (generalized): Secondary | ICD-10-CM | POA: Diagnosis not present

## 2019-08-12 DIAGNOSIS — M25511 Pain in right shoulder: Secondary | ICD-10-CM | POA: Diagnosis not present

## 2019-08-12 DIAGNOSIS — S42201D Unspecified fracture of upper end of right humerus, subsequent encounter for fracture with routine healing: Secondary | ICD-10-CM | POA: Diagnosis not present

## 2019-08-12 DIAGNOSIS — W11XXXD Fall on and from ladder, subsequent encounter: Secondary | ICD-10-CM | POA: Diagnosis not present

## 2019-08-14 DIAGNOSIS — S42201D Unspecified fracture of upper end of right humerus, subsequent encounter for fracture with routine healing: Secondary | ICD-10-CM | POA: Diagnosis not present

## 2019-08-14 DIAGNOSIS — M25511 Pain in right shoulder: Secondary | ICD-10-CM | POA: Diagnosis not present

## 2019-08-14 DIAGNOSIS — W11XXXD Fall on and from ladder, subsequent encounter: Secondary | ICD-10-CM | POA: Diagnosis not present

## 2019-08-14 DIAGNOSIS — M6281 Muscle weakness (generalized): Secondary | ICD-10-CM | POA: Diagnosis not present

## 2019-08-26 DIAGNOSIS — S42201D Unspecified fracture of upper end of right humerus, subsequent encounter for fracture with routine healing: Secondary | ICD-10-CM | POA: Diagnosis not present

## 2019-08-26 DIAGNOSIS — M6281 Muscle weakness (generalized): Secondary | ICD-10-CM | POA: Diagnosis not present

## 2019-08-26 DIAGNOSIS — M25511 Pain in right shoulder: Secondary | ICD-10-CM | POA: Diagnosis not present

## 2019-08-26 DIAGNOSIS — W11XXXD Fall on and from ladder, subsequent encounter: Secondary | ICD-10-CM | POA: Diagnosis not present

## 2019-08-28 DIAGNOSIS — S42201D Unspecified fracture of upper end of right humerus, subsequent encounter for fracture with routine healing: Secondary | ICD-10-CM | POA: Diagnosis not present

## 2019-08-28 DIAGNOSIS — M25511 Pain in right shoulder: Secondary | ICD-10-CM | POA: Diagnosis not present

## 2019-08-28 DIAGNOSIS — W11XXXD Fall on and from ladder, subsequent encounter: Secondary | ICD-10-CM | POA: Diagnosis not present

## 2019-08-28 DIAGNOSIS — M6281 Muscle weakness (generalized): Secondary | ICD-10-CM | POA: Diagnosis not present

## 2019-09-03 DIAGNOSIS — M6281 Muscle weakness (generalized): Secondary | ICD-10-CM | POA: Diagnosis not present

## 2019-09-03 DIAGNOSIS — W11XXXD Fall on and from ladder, subsequent encounter: Secondary | ICD-10-CM | POA: Diagnosis not present

## 2019-09-03 DIAGNOSIS — M25511 Pain in right shoulder: Secondary | ICD-10-CM | POA: Diagnosis not present

## 2019-09-03 DIAGNOSIS — S42201D Unspecified fracture of upper end of right humerus, subsequent encounter for fracture with routine healing: Secondary | ICD-10-CM | POA: Diagnosis not present

## 2019-09-06 DIAGNOSIS — M6281 Muscle weakness (generalized): Secondary | ICD-10-CM | POA: Diagnosis not present

## 2019-09-06 DIAGNOSIS — S42201D Unspecified fracture of upper end of right humerus, subsequent encounter for fracture with routine healing: Secondary | ICD-10-CM | POA: Diagnosis not present

## 2019-09-06 DIAGNOSIS — M25511 Pain in right shoulder: Secondary | ICD-10-CM | POA: Diagnosis not present

## 2019-09-06 DIAGNOSIS — W11XXXD Fall on and from ladder, subsequent encounter: Secondary | ICD-10-CM | POA: Diagnosis not present

## 2019-09-09 DIAGNOSIS — M6281 Muscle weakness (generalized): Secondary | ICD-10-CM | POA: Diagnosis not present

## 2019-09-09 DIAGNOSIS — S42201D Unspecified fracture of upper end of right humerus, subsequent encounter for fracture with routine healing: Secondary | ICD-10-CM | POA: Diagnosis not present

## 2019-09-09 DIAGNOSIS — W11XXXD Fall on and from ladder, subsequent encounter: Secondary | ICD-10-CM | POA: Diagnosis not present

## 2019-09-09 DIAGNOSIS — M25511 Pain in right shoulder: Secondary | ICD-10-CM | POA: Diagnosis not present

## 2019-09-17 ENCOUNTER — Ambulatory Visit: Payer: BC Managed Care – PPO | Attending: Internal Medicine

## 2019-09-17 DIAGNOSIS — Z20828 Contact with and (suspected) exposure to other viral communicable diseases: Secondary | ICD-10-CM | POA: Diagnosis not present

## 2019-09-17 DIAGNOSIS — Z20822 Contact with and (suspected) exposure to covid-19: Secondary | ICD-10-CM

## 2019-09-18 DIAGNOSIS — W11XXXD Fall on and from ladder, subsequent encounter: Secondary | ICD-10-CM | POA: Diagnosis not present

## 2019-09-18 DIAGNOSIS — M25511 Pain in right shoulder: Secondary | ICD-10-CM | POA: Diagnosis not present

## 2019-09-18 DIAGNOSIS — M6281 Muscle weakness (generalized): Secondary | ICD-10-CM | POA: Diagnosis not present

## 2019-09-18 DIAGNOSIS — S42201D Unspecified fracture of upper end of right humerus, subsequent encounter for fracture with routine healing: Secondary | ICD-10-CM | POA: Diagnosis not present

## 2019-09-19 LAB — NOVEL CORONAVIRUS, NAA: SARS-CoV-2, NAA: NOT DETECTED

## 2019-09-23 DIAGNOSIS — M6281 Muscle weakness (generalized): Secondary | ICD-10-CM | POA: Diagnosis not present

## 2019-09-23 DIAGNOSIS — S42201D Unspecified fracture of upper end of right humerus, subsequent encounter for fracture with routine healing: Secondary | ICD-10-CM | POA: Diagnosis not present

## 2019-09-23 DIAGNOSIS — W11XXXD Fall on and from ladder, subsequent encounter: Secondary | ICD-10-CM | POA: Diagnosis not present

## 2019-09-23 DIAGNOSIS — M25511 Pain in right shoulder: Secondary | ICD-10-CM | POA: Diagnosis not present

## 2019-09-30 DIAGNOSIS — M25511 Pain in right shoulder: Secondary | ICD-10-CM | POA: Diagnosis not present

## 2019-09-30 DIAGNOSIS — S42201D Unspecified fracture of upper end of right humerus, subsequent encounter for fracture with routine healing: Secondary | ICD-10-CM | POA: Diagnosis not present

## 2019-09-30 DIAGNOSIS — M6281 Muscle weakness (generalized): Secondary | ICD-10-CM | POA: Diagnosis not present

## 2019-09-30 DIAGNOSIS — W11XXXD Fall on and from ladder, subsequent encounter: Secondary | ICD-10-CM | POA: Diagnosis not present

## 2019-10-01 ENCOUNTER — Other Ambulatory Visit: Payer: Self-pay

## 2019-10-01 ENCOUNTER — Ambulatory Visit: Payer: BC Managed Care – PPO | Attending: Internal Medicine

## 2019-10-01 DIAGNOSIS — Z20822 Contact with and (suspected) exposure to covid-19: Secondary | ICD-10-CM

## 2019-10-02 ENCOUNTER — Other Ambulatory Visit: Payer: Self-pay

## 2019-10-02 DIAGNOSIS — M6281 Muscle weakness (generalized): Secondary | ICD-10-CM | POA: Diagnosis not present

## 2019-10-02 DIAGNOSIS — S42201D Unspecified fracture of upper end of right humerus, subsequent encounter for fracture with routine healing: Secondary | ICD-10-CM | POA: Diagnosis not present

## 2019-10-02 DIAGNOSIS — M25511 Pain in right shoulder: Secondary | ICD-10-CM | POA: Diagnosis not present

## 2019-10-02 DIAGNOSIS — W11XXXD Fall on and from ladder, subsequent encounter: Secondary | ICD-10-CM | POA: Diagnosis not present

## 2019-10-02 LAB — NOVEL CORONAVIRUS, NAA: SARS-CoV-2, NAA: NOT DETECTED

## 2019-10-07 DIAGNOSIS — M542 Cervicalgia: Secondary | ICD-10-CM | POA: Diagnosis not present

## 2019-10-07 DIAGNOSIS — M25531 Pain in right wrist: Secondary | ICD-10-CM | POA: Diagnosis not present

## 2019-10-07 DIAGNOSIS — W11XXXD Fall on and from ladder, subsequent encounter: Secondary | ICD-10-CM | POA: Diagnosis not present

## 2019-10-07 DIAGNOSIS — S42201D Unspecified fracture of upper end of right humerus, subsequent encounter for fracture with routine healing: Secondary | ICD-10-CM | POA: Diagnosis not present

## 2019-10-07 DIAGNOSIS — M6281 Muscle weakness (generalized): Secondary | ICD-10-CM | POA: Diagnosis not present

## 2019-10-07 DIAGNOSIS — M25511 Pain in right shoulder: Secondary | ICD-10-CM | POA: Diagnosis not present

## 2019-10-09 DIAGNOSIS — M6281 Muscle weakness (generalized): Secondary | ICD-10-CM | POA: Diagnosis not present

## 2019-10-09 DIAGNOSIS — S42201D Unspecified fracture of upper end of right humerus, subsequent encounter for fracture with routine healing: Secondary | ICD-10-CM | POA: Diagnosis not present

## 2019-10-09 DIAGNOSIS — M25511 Pain in right shoulder: Secondary | ICD-10-CM | POA: Diagnosis not present

## 2019-10-09 DIAGNOSIS — W11XXXD Fall on and from ladder, subsequent encounter: Secondary | ICD-10-CM | POA: Diagnosis not present

## 2019-10-16 DIAGNOSIS — S42201D Unspecified fracture of upper end of right humerus, subsequent encounter for fracture with routine healing: Secondary | ICD-10-CM | POA: Diagnosis not present

## 2019-10-16 DIAGNOSIS — M6281 Muscle weakness (generalized): Secondary | ICD-10-CM | POA: Diagnosis not present

## 2019-10-16 DIAGNOSIS — W11XXXD Fall on and from ladder, subsequent encounter: Secondary | ICD-10-CM | POA: Diagnosis not present

## 2019-10-16 DIAGNOSIS — M25511 Pain in right shoulder: Secondary | ICD-10-CM | POA: Diagnosis not present

## 2019-10-23 DIAGNOSIS — S42201D Unspecified fracture of upper end of right humerus, subsequent encounter for fracture with routine healing: Secondary | ICD-10-CM | POA: Diagnosis not present

## 2019-10-23 DIAGNOSIS — W11XXXD Fall on and from ladder, subsequent encounter: Secondary | ICD-10-CM | POA: Diagnosis not present

## 2019-10-23 DIAGNOSIS — M6281 Muscle weakness (generalized): Secondary | ICD-10-CM | POA: Diagnosis not present

## 2019-10-23 DIAGNOSIS — M25511 Pain in right shoulder: Secondary | ICD-10-CM | POA: Diagnosis not present

## 2019-10-30 DIAGNOSIS — M79643 Pain in unspecified hand: Secondary | ICD-10-CM | POA: Diagnosis not present

## 2019-10-30 DIAGNOSIS — E782 Mixed hyperlipidemia: Secondary | ICD-10-CM | POA: Diagnosis not present

## 2019-10-30 DIAGNOSIS — E1169 Type 2 diabetes mellitus with other specified complication: Secondary | ICD-10-CM | POA: Diagnosis not present

## 2019-10-30 DIAGNOSIS — I10 Essential (primary) hypertension: Secondary | ICD-10-CM | POA: Diagnosis not present

## 2019-11-04 DIAGNOSIS — M25511 Pain in right shoulder: Secondary | ICD-10-CM | POA: Diagnosis not present

## 2019-11-04 DIAGNOSIS — G5601 Carpal tunnel syndrome, right upper limb: Secondary | ICD-10-CM | POA: Diagnosis not present

## 2019-12-02 DIAGNOSIS — G5601 Carpal tunnel syndrome, right upper limb: Secondary | ICD-10-CM | POA: Diagnosis not present

## 2019-12-02 DIAGNOSIS — S42201D Unspecified fracture of upper end of right humerus, subsequent encounter for fracture with routine healing: Secondary | ICD-10-CM | POA: Diagnosis not present

## 2019-12-06 ENCOUNTER — Other Ambulatory Visit: Payer: Self-pay

## 2019-12-06 ENCOUNTER — Encounter: Payer: Self-pay | Admitting: Neurology

## 2019-12-06 DIAGNOSIS — G5601 Carpal tunnel syndrome, right upper limb: Secondary | ICD-10-CM

## 2019-12-12 ENCOUNTER — Other Ambulatory Visit: Payer: Self-pay

## 2019-12-12 DIAGNOSIS — R202 Paresthesia of skin: Secondary | ICD-10-CM

## 2020-01-07 ENCOUNTER — Other Ambulatory Visit: Payer: Self-pay

## 2020-01-07 ENCOUNTER — Ambulatory Visit (INDEPENDENT_AMBULATORY_CARE_PROVIDER_SITE_OTHER): Payer: BC Managed Care – PPO | Admitting: Neurology

## 2020-01-07 DIAGNOSIS — G5601 Carpal tunnel syndrome, right upper limb: Secondary | ICD-10-CM

## 2020-01-07 DIAGNOSIS — G5621 Lesion of ulnar nerve, right upper limb: Secondary | ICD-10-CM

## 2020-01-07 DIAGNOSIS — R202 Paresthesia of skin: Secondary | ICD-10-CM | POA: Diagnosis not present

## 2020-01-07 NOTE — Procedures (Signed)
[facility]  [address] [place], [address]  [place], [place] [address] Tel: [phone] Fax:  [phone] Test Date:  01/07/2020  Patient: Tim Cook DOB: November 07, 1960 Physician: Nita Sickle, DO  Sex: Male Height: 5\' 8"  Ref Phys: , MD  ID#: Teryl Lucy Temp: 33.0C Technician:    Patient Complaints: This is a 59 year old man referred for evaluation of right hand paresthesias.  NCV & EMG Findings: Extensive electrodiagnostic testing of the right upper extremity shows:  1. Right median sensory response is absent.  Right ulnar sensory response shows reduced amplitude (7.3 V).  Right radial sensory responses within normal limits. 2. Right median motor response shows severely prolonged latency (9.5 ms) and reduced amplitude (1.6 mV).  Of note, there is evidence of a median-to-ulnar crossover in the forearm as seen by a motor response stimulating at the ulnar-wrist and recording at the abductor pollicis brevis muscle, consistent with a Martin-Gruber anastomosis.  The right ulnar motor response shows reduced amplitude (5.9 mV) and decreased conduction velocity (A Elbow-B Elbow, 31 m/s).  3. Chronic motor axonal loss changes are seen affecting the abductor pollicis brevis, first dorsal interosseous, and abductor digiti minimi muscles, without accompanied active denervation.  Impression: 1. Right median neuropathy at or distal to the wrist (very severe), consistent with a clinical diagnosis of carpal tunnel syndrome.  2. Right ulnar neuropathy with slowing across the elbow, demyelinating and axonal in type; moderate. 3. Incidentally, there is a right Martin-Gruber anastomosis, a normal anatomic variant.   ___________________________ 59, DO    Nerve Conduction Studies Anti Sensory Summary Table   Stim Site NR Peak (ms) Norm Peak (ms) P-T Amp (V) Norm P-T Amp  Right Median Anti Sensory (2nd Digit)  33C  Wrist NR  <3.6  >15  Right Radial Anti Sensory (Base  1st Digit)  33C  Wrist    2.7 <2.7 15.2 >14  Right Ulnar Anti Sensory (5th Digit)  33C  Wrist    3.0 <3.1 7.3 >10   Motor Summary Table   Stim Site NR Onset (ms) Norm Onset (ms) O-P Amp (mV) Norm O-P Amp Site1 Site2 Delta-0 (ms) Dist (cm) Vel (m/s) Norm Vel (m/s)  Right Median Motor (Abd Poll Brev)  33C  Wrist    9.5 <4.0 1.6 >6 Elbow Wrist 5.0 29.0 58 >50  Elbow    14.5  0.9  Ulnar-wrist Elbow 9.8 0.0    Ulnar-wrist    4.7  2.4         Right Ulnar Motor (Abd Dig Minimi)  33C  Wrist    2.3 <3.1 5.9 >7 B Elbow Wrist 4.3 23.0 53 >50  B Elbow    6.6  5.2  A Elbow B Elbow 3.2 10.0 31 >50  A Elbow    9.8  4.9          EMG   Side Muscle Ins Act Fibs Psw Fasc Number Recrt Dur Dur. Amp Amp. Poly Poly. Comment  Right 1stDorInt Nml Nml Nml Nml 1- Rapid Few 1+ Few 1+ Nml Nml N/A  Right ABD Dig Min Nml Nml Nml Nml 1- Mod-R Some 1+ Some 1+ Some 1+ N/A  Right Abd Poll Brev Nml Nml Nml Nml 3- Rapid Most 1+ Most 1+ Most 1+ N/A  Right Biceps Nml Nml Nml Nml Nml Nml Nml Nml Nml Nml Nml Nml N/A  Right Triceps Nml Nml Nml Nml Nml Nml Nml Nml Nml Nml Nml Nml N/A  Right Deltoid Nml Nml Nml Nml  Nml Nml Nml Nml Nml Nml Nml Nml N/A  Right PronatorTeres Nml Nml Nml Nml Nml Nml Nml Nml Nml Nml Nml Nml N/A  Right FlexDigProf 4,5 Nml Nml Nml Nml Nml Nml Nml Nml Nml Nml Nml Nml N/A      Waveforms:

## 2020-01-13 DIAGNOSIS — G5601 Carpal tunnel syndrome, right upper limb: Secondary | ICD-10-CM | POA: Diagnosis not present

## 2020-01-28 DIAGNOSIS — I10 Essential (primary) hypertension: Secondary | ICD-10-CM | POA: Diagnosis not present

## 2020-01-28 DIAGNOSIS — E782 Mixed hyperlipidemia: Secondary | ICD-10-CM | POA: Diagnosis not present

## 2020-01-28 DIAGNOSIS — E1169 Type 2 diabetes mellitus with other specified complication: Secondary | ICD-10-CM | POA: Diagnosis not present

## 2020-01-28 DIAGNOSIS — I2510 Atherosclerotic heart disease of native coronary artery without angina pectoris: Secondary | ICD-10-CM | POA: Diagnosis not present

## 2020-02-10 DIAGNOSIS — G5601 Carpal tunnel syndrome, right upper limb: Secondary | ICD-10-CM | POA: Diagnosis not present

## 2020-02-10 DIAGNOSIS — S42201D Unspecified fracture of upper end of right humerus, subsequent encounter for fracture with routine healing: Secondary | ICD-10-CM | POA: Diagnosis not present

## 2020-03-06 ENCOUNTER — Other Ambulatory Visit: Payer: Self-pay

## 2020-03-06 ENCOUNTER — Encounter: Payer: Self-pay | Admitting: Family Medicine

## 2020-03-06 ENCOUNTER — Ambulatory Visit: Payer: BC Managed Care – PPO | Admitting: Family Medicine

## 2020-03-06 VITALS — BP 128/80 | HR 98 | Ht 68.0 in | Wt 247.0 lb

## 2020-03-06 DIAGNOSIS — M5136 Other intervertebral disc degeneration, lumbar region: Secondary | ICD-10-CM

## 2020-03-06 DIAGNOSIS — M999 Biomechanical lesion, unspecified: Secondary | ICD-10-CM

## 2020-03-06 MED ORDER — TIZANIDINE HCL 4 MG PO TABS: 4.0000 mg | ORAL_TABLET | Freq: Every day | ORAL | 0 refills | Status: DC

## 2020-03-06 MED ORDER — METHYLPREDNISOLONE ACETATE 80 MG/ML IJ SUSP
80.0000 mg | Freq: Once | INTRAMUSCULAR | Status: AC
  Administered 2020-03-06: 80 mg via INTRAMUSCULAR

## 2020-03-06 MED ORDER — PREDNISONE 50 MG PO TABS: ORAL_TABLET | ORAL | 0 refills | Status: DC

## 2020-03-06 MED ORDER — KETOROLAC TROMETHAMINE 60 MG/2ML IM SOLN
60.0000 mg | Freq: Once | INTRAMUSCULAR | Status: AC
  Administered 2020-03-06: 60 mg via INTRAMUSCULAR

## 2020-03-06 MED ORDER — GABAPENTIN 100 MG PO CAPS: 200.0000 mg | ORAL_CAPSULE | Freq: Every day | ORAL | 0 refills | Status: DC

## 2020-03-06 NOTE — Progress Notes (Signed)
[name] [address] [place] [address] Phone: [phone] Subjective:    I'm seeing this patient by the request  of:  Wilfrid Lund, PA   I, Debbe Odea, am serving as a scribe for Dr. Clementeen Graham.  CC: Back pain  [id number]  Tim Cook is a 59 y.o. male coming in with complaint of back and neck pain. OMT 03/25/2019. Patient states he fell on Monday morning leg got hung up on a ladder and he twisted his back. Hurts worse to stand and walk. Describes pain as sharp has expired tramadol from the past and took that. Locates pain across low back worse on right side.  Patient states that this feels similar to some of his exacerbations previously.  Nearly a year since we have seen patient.         Reviewed prior external information including notes and imaging from previsou exam, outside providers and external EMR if available.   As well as notes that were available from care everywhere and other healthcare systems.  Past medical history, social, surgical and family history all reviewed in electronic medical record.  No pertanent information unless stated regarding to the chief complaint.   Past Medical History:  Diagnosis Date  . Arthritis    "knees, lower back, hands" (05/18/2016)  . CAD (coronary artery disease), native coronary artery    05/18/16  Cath severe 90% LAD prox, 99% mid, Circ dominant with 30% OM1, 50% OM2, 30% OM3, 60% distal circ and PL, severe stenosis nondominant RCA.  EF 55%  3.0x 20, 2.5 x 20 mm, 2.25 x 16 mm Promus premier sent to LAD Dr. Clifton James   . Chronic bronchitis (HCC)    "most years" (05/18/2016)  . Chronic lower back pain   . DDD (degenerative disc disease), lumbar   . Hyperlipidemia   . Hypertension   . Sleep apnea    "dx'd; never wore mask" (05/18/2016)  . Squamous carcinoma    cut off right upper arm, left shoulder; froze off nose and right side of face  . Type II diabetes mellitus (HCC)   .  Unstable angina (HCC)   . Vomiting     No Known Allergies   Review of Systems:  No headache, visual changes, nausea, vomiting, diarrhea, constipation, dizziness, abdominal pain, skin rash, fevers, chills, night sweats, weight loss, swollen lymph nodes, body aches, joint swelling, chest pain, shortness of breath, mood changes. POSITIVE muscle aches  Objective  Blood pressure 128/80, pulse 98, height 5\' 8"  (1.727 m), weight 247 lb (112 kg), SpO2 99 %.   General: No apparent distress alert and oriented x3 mood and affect normal, dressed appropriately.  HEENT: Pupils equal, extraocular movements intact  Respiratory: Patient's speak in full sentences and does not appear short of breath  Cardiovascular: No lower extremity edema, non tender, no erythema  Neuro: Cranial nerves II through XII are intact, neurovascularly intact in all extremities with 2+ DTRs and 2+ pulses.  Gait normal with good balance and coordination.  MSK:  Non tender with full range of motion and good stability and symmetric strength and tone of shoulders, elbows, wrist, hip, knee and ankles bilaterally.  Back -back exam does show that patient has some loss of lordosis.  Patient is tender to palpation in the thoracolumbar juncture more than the sacroiliac joint.  Patient is tight in all range of motion but negative straight leg test.  Tightness with FABER test.  Osteopathic findings  T9 extended rotated and side bent left L2 flexed rotated and side bent right L4 flexed rotated and side bent left sacrum right on right       Assessment and Plan:  Patient Active Problem List   Diagnosis Date Noted  . Dislocation of left middle finger 01/21/2019  . Bursitis of right shoulder 01/21/2019  . Degenerative arthritis of right knee 05/09/2017  . Nonallopathic lesion of lumbosacral region 07/07/2016  . Nonallopathic lesion of sacral region 07/07/2016  . Nonallopathic lesion of thoracic region 07/07/2016  . Degenerative  disc disease, lumbar 07/07/2016  . Acute lateral meniscal tear 06/09/2016  . Bariatric surgery status 05/18/2016  . Hyperlipidemia 05/18/2016  . Unstable angina ([place])   . CAD (coronary artery disease), native coronary artery   . Hypertensive heart disease without CHF   . Controlled type 2 diabetes with retinopathy ([place])   . Obesity (BMI 30-39.9)      Degenerative disc disease, lumbar Degenerative disc disease.  Discussed posture and ergonomics, discussed which activities to doing which wants to avoid.  Increase in activity slowly.  Discussed medication management for this exacerbation including the gabapentin as well as refilled the Zanaflex.  Patient given today another handout of different home exercises.  Patient will follow up with me again in 4 to 6 weeks   Nonallopathic problems  Decision today to treat with OMT was based on Physical Exam  After verbal consent patient was treated with HVLA, ME, FPR techniques in  thoracic, lumbar, and sacral  areas  Patient tolerated the procedure well with improvement in symptoms  Patient given exercises, stretches and lifestyle modifications  See medications in patient instructions if given  Patient will follow up in 4-8 weeks      The above documentation has been reviewed and is accurate and complete [name], DO       Note: This dictation was prepared with Dragon dictation along with smaller phrase technology. Any transcriptional errors that result from this process are unintentional.

## 2020-03-06 NOTE — Patient Instructions (Addendum)
Prednisone- start tomorrow 03/07/2020 Gabapentin Zanaflex See me again in 4 weeks

## 2020-03-07 ENCOUNTER — Encounter: Payer: Self-pay | Admitting: Family Medicine

## 2020-03-07 NOTE — Assessment & Plan Note (Signed)
Degenerative disc disease.  Discussed posture and ergonomics, discussed which activities to doing which wants to avoid.  Increase in activity slowly.  Discussed medication management for this exacerbation including the gabapentin as well as refilled the Zanaflex.  Patient given today another handout of different home exercises.  Patient will follow up with me again in 4 to 6 weeks

## 2020-03-09 DIAGNOSIS — W19XXXA Unspecified fall, initial encounter: Secondary | ICD-10-CM | POA: Diagnosis not present

## 2020-03-09 DIAGNOSIS — R69 Illness, unspecified: Secondary | ICD-10-CM | POA: Diagnosis not present

## 2020-03-09 DIAGNOSIS — R5381 Other malaise: Secondary | ICD-10-CM | POA: Diagnosis not present

## 2020-03-11 DIAGNOSIS — G5601 Carpal tunnel syndrome, right upper limb: Secondary | ICD-10-CM | POA: Diagnosis not present

## 2020-03-11 DIAGNOSIS — S42294A Other nondisplaced fracture of upper end of right humerus, initial encounter for closed fracture: Secondary | ICD-10-CM | POA: Diagnosis not present

## 2020-03-11 DIAGNOSIS — S52501A Unspecified fracture of the lower end of right radius, initial encounter for closed fracture: Secondary | ICD-10-CM | POA: Diagnosis not present

## 2020-03-12 DIAGNOSIS — S42201A Unspecified fracture of upper end of right humerus, initial encounter for closed fracture: Secondary | ICD-10-CM

## 2020-03-12 DIAGNOSIS — S52501A Unspecified fracture of the lower end of right radius, initial encounter for closed fracture: Secondary | ICD-10-CM | POA: Diagnosis present

## 2020-03-12 HISTORY — DX: Unspecified fracture of upper end of right humerus, initial encounter for closed fracture: S42.201A

## 2020-03-12 HISTORY — DX: Unspecified fracture of the lower end of right radius, initial encounter for closed fracture: S52.501A

## 2020-03-13 ENCOUNTER — Other Ambulatory Visit (HOSPITAL_COMMUNITY)
Admission: RE | Admit: 2020-03-13 | Discharge: 2020-03-13 | Disposition: A | Discharge status: Home / Self Care | Payer: BC Managed Care – PPO | Source: Ambulatory Visit | Attending: Orthopedic Surgery | Admitting: Orthopedic Surgery

## 2020-03-13 DIAGNOSIS — Z20822 Contact with and (suspected) exposure to covid-19: Secondary | ICD-10-CM | POA: Diagnosis not present

## 2020-03-13 DIAGNOSIS — Z01812 Encounter for preprocedural laboratory examination: Secondary | ICD-10-CM | POA: Diagnosis not present

## 2020-03-13 NOTE — Patient Instructions (Addendum)
DUE TO COVID-19 ONLY ONE VISITOR IS ALLOWED TO COME WITH YOU AND STAY IN THE WAITING ROOM ONLY DURING PRE OP AND PROCEDURE DAY OF SURGERY. THE 2 VISITOR MAY VISIT WITH YOU AFTER SURGERY IN YOUR PRIVATE ROOM DURING VISITING HOURS ONLY!  ) ONCE YOUR COVID TEST IS COMPLETED, PLEASE BEGIN THE QUARANTINE INSTRUCTIONS AS OUTLINED IN YOUR HANDOUT.                Tim Cook    Your procedure is scheduled on: 03/17/20   Report to [facility] Main  Entrance   Report to admitting at 10:25AM     Call this number if you have problems the morning of surgery [place], NO CHEWING GUM [place].   Do not eat food After Midnight  . YOU MAY HAVE CLEAR LIQUIDS FROM MIDNIGHT UNTIL 9:00 AM  . At 9:00 AM Please finish the prescribed Pre-Surgery Gatorade drink   Nothing by mouth after you finish the Gatorade drink !   Take these medicines the morning of surgery with A SIP OF WATER: None  DO NOT TAKE ANY DIABETIC MEDICATIONS DAY OF YOUR SURGERY      How to Manage Your Diabetes Before and After Surgery  Why is it important to control my blood sugar before and after surgery? . Improving blood sugar levels before and after surgery helps healing and can limit problems. . A way of improving blood sugar control is eating a healthy diet by: o  Eating less sugar and carbohydrates o  Increasing activity/exercise o  Talking with your doctor about reaching your blood sugar goals . High blood sugars (greater than 180 mg/dL) can raise your risk of infections and slow your recovery, so you will need to focus on controlling your diabetes during the weeks before surgery. . Make sure that the doctor who takes care of your diabetes knows about your planned surgery including the date and location.  How do I manage my blood sugar before surgery? . Check your blood sugar at least 4 times a day, starting 2 days before surgery, to make  sure that the level is not too high or low. o Check your blood sugar the morning of your surgery when you wake up and every 2 hours until you get to the Short Stay unit. . If your blood sugar is less than 70 mg/dL, you will need to treat for low blood sugar: o Do not take insulin. o Treat a low blood sugar (less than 70 mg/dL) with  cup of clear juice (cranberry or apple), 4 glucose tablets, OR glucose gel. o Recheck blood sugar in 15 minutes after treatment (to make sure it is greater than 70 mg/dL). If your blood sugar is not greater than 70 mg/dL on recheck, call [phone] for further instructions. . Report your blood sugar to the short stay nurse when you get to Short Stay.  . If you are admitted to the hospital after surgery: o Your blood sugar will be checked by the staff and you will probably be given insulin after surgery (instead of oral diabetes medicines) to make sure you have good blood sugar levels. o The goal for blood sugar control after surgery is 80-180 mg/dL.   WHAT DO I DO ABOUT MY DIABETES MEDICATION?  Marland Kitchen Do not take oral diabetes medicines (pills) the morning of surgery.        You may not  have any metal on your body including               piercings  Do not wear jewelry, lotions, powders or deodorant                      Men may shave face and neck.   Do not bring valuables to the hospital. [facility] IS NOT             RESPONSIBLE   FOR VALUABLES.  Contacts, dentures or bridgework may not be worn into surgery.      Patients discharged the day of surgery will not be allowed to drive home  . IF YOU ARE HAVING SURGERY AND GOING HOME THE SAME DAY, YOU MUST HAVE AN ADULT TO DRIVE YOU HOME AND BE WITH YOU FOR 24 HOURS.   YOU MAY GO HOME BY TAXI OR UBER OR ORTHERWISE, BUT AN ADULT MUST ACCOMPANY YOU HOME AND STAY WITH YOU FOR 24 HOURS.  Name and phone number of your driver:  Special Instructions: N/A              Please read over the following fact sheets you  were given: _____________________________________________________________________             [facility] - Preparing for Surgery Before surgery, you can play an important role.   Because skin is not sterile, your skin needs to be as free of germs as possible.   You can reduce the number of germs on your skin by washing with CHG (chlorahexidine gluconate) soap before surgery .  CHG is an antiseptic cleaner which kills germs and bonds with the skin to continue killing germs even after washing. Please DO NOT use if you have an allergy to CHG or antibacterial soaps.   If your skin becomes reddened/irritated stop using the CHG and inform your nurse when you arrive at Short Stay.   You may shave your face/neck. Please follow these instructions carefully:  1.  Shower with CHG Soap the night before surgery and the  morning of Surgery.  2.  If you choose to wash your hair, wash your hair first as usual with your  normal  shampoo.  3.  After you shampoo, rinse your hair and body thoroughly to remove the  shampoo.                                        4.  Use CHG as you would any other liquid soap.  You can apply chg directly  to the skin and wash                       Gently with a scrungie or clean washcloth.  5.  Apply the CHG Soap to your body ONLY FROM THE NECK DOWN.   Do not use on face/ open                           Wound or open sores. Avoid contact with eyes, ears mouth and genitals (private parts).                       Wash face,  Genitals (private parts) with your normal soap.             6.  Wash  thoroughly, paying special attention to the area where your surgery  will be performed.  7.  Thoroughly rinse your body with warm water from the neck down.  8.  DO NOT shower/wash with your normal soap after using and rinsing off  the CHG Soap.             9.  Pat yourself dry with a clean towel.            10.  Wear clean pajamas.            11.  Place clean sheets on your bed the night of  your first shower and do not  sleep with pets. Day of Surgery : Do not apply any lotions/deodorants the morning of surgery.  Please wear clean clothes to the hospital/surgery center.  FAILURE TO FOLLOW THESE INSTRUCTIONS MAY RESULT IN THE CANCELLATION OF YOUR SURGERY PATIENT SIGNATURE_________________________________  NURSE SIGNATURE__________________________________  ________________________________________________________________________   Rogelia Mire  An incentive spirometer is a tool that can help keep your lungs clear and active. This tool measures how well you are filling your lungs with each breath. Taking long deep breaths may help reverse or decrease the chance of developing breathing (pulmonary) problems (especially infection) following:  A long period of time when you are unable to move or be active. BEFORE THE PROCEDURE   If the spirometer includes an indicator to show your best effort, your nurse or respiratory therapist will set it to a desired goal.  If possible, sit up straight or lean slightly forward. Try not to slouch.  Hold the incentive spirometer in an upright position. INSTRUCTIONS FOR USE  1. Sit on the edge of your bed if possible, or sit up as far as you can in bed or on a chair. 2. Hold the incentive spirometer in an upright position. 3. Breathe out normally. 4. Place the mouthpiece in your mouth and seal your lips tightly around it. 5. Breathe in slowly and as deeply as possible, raising the piston or the ball toward the top of the column. 6. Hold your breath for 3-5 seconds or for as long as possible. Allow the piston or ball to fall to the bottom of the column. 7. Remove the mouthpiece from your mouth and breathe out normally. 8. Rest for a few seconds and repeat Steps 1 through 7 at least 10 times every 1-2 hours when you are awake. Take your time and take a few normal breaths between deep breaths. 9. The spirometer may include an indicator to  show your best effort. Use the indicator as a goal to work toward during each repetition. 10. After each set of 10 deep breaths, practice coughing to be sure your lungs are clear. If you have an incision (the cut made at the time of surgery), support your incision when coughing by placing a pillow or rolled up towels firmly against it. Once you are able to get out of bed, walk around indoors and cough well. You may stop using the incentive spirometer when instructed by your caregiver.  RISKS AND COMPLICATIONS  Take your time so you do not get dizzy or light-headed.  If you are in pain, you may need to take or ask for pain medication before doing incentive spirometry. It is harder to take a deep breath if you are having pain. AFTER USE  Rest and breathe slowly and easily.  It can be helpful to keep track of a log of your progress. Your caregiver can provide  you with a simple table to help with this. If you are using the spirometer at home, follow these instructions: [place] IF:   You are having difficultly using the spirometer.  You have trouble using the spirometer as often as instructed.  Your pain medication is not giving enough relief while using the spirometer.  You develop fever of 100.5 F (38.1 C) or higher. SEEK IMMEDIATE MEDICAL CARE IF:   You cough up bloody sputum that had not been present before.  You develop fever of 102 F (38.9 C) or greater.  You develop worsening pain at or near the incision site. MAKE SURE YOU:   Understand these instructions.  Will watch your condition.  Will get help right away if you are not doing well or get worse. Document Released: 01/23/2007 Document Revised: 12/05/2011 Document Reviewed: 03/26/2007 [facility] Patient Information 2014 [place], [place].   ________________________________________________________________________

## 2020-03-14 LAB — SARS CORONAVIRUS 2 (TAT 6-24 HRS): SARS Coronavirus 2: NEGATIVE

## 2020-03-16 ENCOUNTER — Encounter (HOSPITAL_COMMUNITY): Payer: Self-pay

## 2020-03-16 ENCOUNTER — Other Ambulatory Visit: Payer: Self-pay

## 2020-03-16 ENCOUNTER — Encounter (HOSPITAL_COMMUNITY)
Admission: RE | Admit: 2020-03-16 | Discharge: 2020-03-16 | Disposition: A | Discharge status: Home / Self Care | Payer: PRIVATE HEALTH INSURANCE | Source: Ambulatory Visit | Attending: Orthopedic Surgery | Admitting: Orthopedic Surgery

## 2020-03-16 ENCOUNTER — Ambulatory Visit: Payer: BC Managed Care – PPO | Admitting: Cardiology

## 2020-03-16 ENCOUNTER — Telehealth: Payer: Self-pay | Admitting: Cardiology

## 2020-03-16 DIAGNOSIS — I519 Heart disease, unspecified: Secondary | ICD-10-CM | POA: Insufficient documentation

## 2020-03-16 DIAGNOSIS — Z01818 Encounter for other preprocedural examination: Secondary | ICD-10-CM | POA: Insufficient documentation

## 2020-03-16 LAB — CBC
HCT: 47.2 % (ref 39.0–52.0)
Hemoglobin: 14.7 g/dL (ref 13.0–17.0)
MCH: 30.9 pg (ref 26.0–34.0)
MCHC: 31.1 g/dL (ref 30.0–36.0)
MCV: 99.2 fL (ref 80.0–100.0)
Platelets: 358 10*3/uL (ref 150–400)
RBC: 4.76 MIL/uL (ref 4.22–5.81)
RDW: 12 % (ref 11.5–15.5)
WBC: 11.7 10*3/uL — ABNORMAL HIGH (ref 4.0–10.5)
nRBC: 0 % (ref 0.0–0.2)

## 2020-03-16 LAB — GLUCOSE, CAPILLARY: Glucose-Capillary: 122 mg/dL — ABNORMAL HIGH (ref 70–99)

## 2020-03-16 LAB — BASIC METABOLIC PANEL
Anion gap: 11 (ref 5–15)
BUN: 20 mg/dL (ref 6–20)
CO2: 24 mmol/L (ref 22–32)
Calcium: 9.3 mg/dL (ref 8.9–10.3)
Chloride: 101 mmol/L (ref 98–111)
Creatinine, Ser: 1 mg/dL (ref 0.61–1.24)
GFR calc Af Amer: >60 mL/min (ref >60)
GFR calc non Af Amer: >60 mL/min (ref >60)
Glucose, Bld: 131 mg/dL — ABNORMAL HIGH (ref 70–99)
Potassium: 4.7 mmol/L (ref 3.5–5.1)
Sodium: 136 mmol/L (ref 135–145)

## 2020-03-16 NOTE — Telephone Encounter (Signed)
LVM for surgery scheduler at Dr. Shelba Flake office for someone to call with surgical clearance information.

## 2020-03-16 NOTE — Telephone Encounter (Signed)
Patient calling stating he has surgery tomorrow and he would like to know if he needs to stop his aspirin. He states Dr. Britt Boozer office sent a fax last Wednesday.

## 2020-03-16 NOTE — Telephone Encounter (Signed)
Left Voice mail to back to my personal cell.

## 2020-03-16 NOTE — Telephone Encounter (Signed)
Per Pre OP Team requested clearance form to be faxed to our office. Pt's surgery has been set for tomorrow 03/17/20 with Dr. Teryl Lucy. I have left a message for Dr. Shelba Flake office to please fax a clearance form over to our office [phone].

## 2020-03-16 NOTE — Telephone Encounter (Signed)
Spoke with Careers adviser. Patient cleared at acceptable risk. Per provider, patient hasn't had any active cardiac symptoms.

## 2020-03-16 NOTE — Telephone Encounter (Signed)
I s/w Sherri, surgery scheduler for Dr. Dion Saucier. I discussed the recommendations from our pre op provider Chelsea Aus, PAC that the pt will need an appt since he has not bee seen 05/2019, as well as generally ASA would be held x 7 days. Per Roanna Raider, this pt's surgery is urgent and cannot be postponed. I stated that I am going to have Vin Bhagat, PAC call her to discuss further. Sherri also states Dr. Dion Saucier is in the office right now if the [facility] needs to s/w the surgeon as well.

## 2020-03-17 ENCOUNTER — Ambulatory Visit (HOSPITAL_COMMUNITY): Payer: PRIVATE HEALTH INSURANCE

## 2020-03-17 ENCOUNTER — Ambulatory Visit (HOSPITAL_COMMUNITY): Payer: PRIVATE HEALTH INSURANCE | Admitting: Anesthesiology

## 2020-03-17 ENCOUNTER — Observation Stay (HOSPITAL_COMMUNITY)
Admission: RE | Admit: 2020-03-17 | Discharge: 2020-03-18 | Disposition: A | Discharge status: Home / Self Care | Payer: PRIVATE HEALTH INSURANCE | Attending: Orthopedic Surgery | Admitting: Orthopedic Surgery

## 2020-03-17 ENCOUNTER — Encounter (HOSPITAL_COMMUNITY)
Admission: RE | Disposition: A | Discharge status: Home / Self Care | Payer: Self-pay | Source: Home / Self Care | Attending: Orthopedic Surgery

## 2020-03-17 ENCOUNTER — Encounter (HOSPITAL_COMMUNITY): Payer: Self-pay | Admitting: Orthopedic Surgery

## 2020-03-17 ENCOUNTER — Ambulatory Visit (HOSPITAL_COMMUNITY): Payer: PRIVATE HEALTH INSURANCE | Attending: Orthopedic Surgery

## 2020-03-17 DIAGNOSIS — Z87891 Personal history of nicotine dependence: Secondary | ICD-10-CM | POA: Diagnosis not present

## 2020-03-17 DIAGNOSIS — E785 Hyperlipidemia, unspecified: Secondary | ICD-10-CM | POA: Insufficient documentation

## 2020-03-17 DIAGNOSIS — Z955 Presence of coronary angioplasty implant and graft: Secondary | ICD-10-CM | POA: Diagnosis not present

## 2020-03-17 DIAGNOSIS — E119 Type 2 diabetes mellitus without complications: Secondary | ICD-10-CM | POA: Diagnosis not present

## 2020-03-17 DIAGNOSIS — G4730 Sleep apnea, unspecified: Secondary | ICD-10-CM | POA: Insufficient documentation

## 2020-03-17 DIAGNOSIS — G5601 Carpal tunnel syndrome, right upper limb: Secondary | ICD-10-CM | POA: Insufficient documentation

## 2020-03-17 DIAGNOSIS — Z85828 Personal history of other malignant neoplasm of skin: Secondary | ICD-10-CM | POA: Diagnosis not present

## 2020-03-17 DIAGNOSIS — Z7982 Long term (current) use of aspirin: Secondary | ICD-10-CM | POA: Insufficient documentation

## 2020-03-17 DIAGNOSIS — M898X3 Other specified disorders of bone, forearm: Secondary | ICD-10-CM | POA: Diagnosis not present

## 2020-03-17 DIAGNOSIS — S52501A Unspecified fracture of the lower end of right radius, initial encounter for closed fracture: Secondary | ICD-10-CM | POA: Diagnosis present

## 2020-03-17 DIAGNOSIS — Z823 Family history of stroke: Secondary | ICD-10-CM | POA: Insufficient documentation

## 2020-03-17 DIAGNOSIS — S42201A Unspecified fracture of upper end of right humerus, initial encounter for closed fracture: Secondary | ICD-10-CM | POA: Insufficient documentation

## 2020-03-17 DIAGNOSIS — Z833 Family history of diabetes mellitus: Secondary | ICD-10-CM | POA: Diagnosis not present

## 2020-03-17 DIAGNOSIS — I2510 Atherosclerotic heart disease of native coronary artery without angina pectoris: Secondary | ICD-10-CM | POA: Insufficient documentation

## 2020-03-17 DIAGNOSIS — I25110 Atherosclerotic heart disease of native coronary artery with unstable angina pectoris: Secondary | ICD-10-CM | POA: Insufficient documentation

## 2020-03-17 DIAGNOSIS — S42291A Other displaced fracture of upper end of right humerus, initial encounter for closed fracture: Principal | ICD-10-CM | POA: Insufficient documentation

## 2020-03-17 DIAGNOSIS — M130 Polyarthritis, unspecified: Secondary | ICD-10-CM | POA: Diagnosis not present

## 2020-03-17 DIAGNOSIS — W19XXXA Unspecified fall, initial encounter: Secondary | ICD-10-CM | POA: Insufficient documentation

## 2020-03-17 DIAGNOSIS — Y990 Civilian activity done for income or pay: Secondary | ICD-10-CM | POA: Insufficient documentation

## 2020-03-17 DIAGNOSIS — G8929 Other chronic pain: Secondary | ICD-10-CM | POA: Insufficient documentation

## 2020-03-17 DIAGNOSIS — Z7984 Long term (current) use of oral hypoglycemic drugs: Secondary | ICD-10-CM | POA: Insufficient documentation

## 2020-03-17 DIAGNOSIS — Z419 Encounter for procedure for purposes other than remedying health state, unspecified: Secondary | ICD-10-CM

## 2020-03-17 DIAGNOSIS — Z8249 Family history of ischemic heart disease and other diseases of the circulatory system: Secondary | ICD-10-CM | POA: Diagnosis not present

## 2020-03-17 DIAGNOSIS — M5136 Other intervertebral disc degeneration, lumbar region: Secondary | ICD-10-CM | POA: Diagnosis not present

## 2020-03-17 DIAGNOSIS — M545 Low back pain: Secondary | ICD-10-CM | POA: Diagnosis not present

## 2020-03-17 DIAGNOSIS — Z79899 Other long term (current) drug therapy: Secondary | ICD-10-CM | POA: Diagnosis not present

## 2020-03-17 DIAGNOSIS — S52571A Other intraarticular fracture of lower end of right radius, initial encounter for closed fracture: Secondary | ICD-10-CM | POA: Insufficient documentation

## 2020-03-17 HISTORY — PX: CARPAL TUNNEL RELEASE: SHX101

## 2020-03-17 HISTORY — PX: ORIF HUMERUS FRACTURE: SHX2126

## 2020-03-17 LAB — GLUCOSE, CAPILLARY
Glucose-Capillary: 133 mg/dL — ABNORMAL HIGH (ref 70–99)
Glucose-Capillary: 137 mg/dL — ABNORMAL HIGH (ref 70–99)
Glucose-Capillary: 154 mg/dL — ABNORMAL HIGH (ref 70–99)
Glucose-Capillary: 159 mg/dL — ABNORMAL HIGH (ref 70–99)

## 2020-03-17 LAB — HEMOGLOBIN A1C
Hgb A1c MFr Bld: 6.9 % — ABNORMAL HIGH (ref 4.8–5.6)
Mean Plasma Glucose: 151 mg/dL

## 2020-03-17 SURGERY — OPEN REDUCTION INTERNAL FIXATION (ORIF) PROXIMAL HUMERUS FRACTURE
Anesthesia: General | Laterality: Right

## 2020-03-17 MED ORDER — INSULIN ASPART 100 UNIT/ML ~~LOC~~ SOLN
0.0000 [IU] | Freq: Three times a day (TID) | SUBCUTANEOUS | Status: DC
  Administered 2020-03-18: 2 [IU] via SUBCUTANEOUS

## 2020-03-17 MED ORDER — DAPAGLIFLOZIN PROPANEDIOL 10 MG PO TABS
10.0000 mg | ORAL_TABLET | Freq: Every day | ORAL | Status: DC
  Administered 2020-03-18: 10 mg via ORAL
  Filled 2020-03-17: qty 1

## 2020-03-17 MED ORDER — ACETAMINOPHEN 325 MG PO TABS: 325.0000 mg | ORAL_TABLET | Freq: Four times a day (QID) | ORAL | Status: DC | PRN

## 2020-03-17 MED ORDER — MIDAZOLAM HCL 5 MG/5ML IJ SOLN
INTRAMUSCULAR | Status: DC | PRN
  Administered 2020-03-17: 1 mg via INTRAVENOUS

## 2020-03-17 MED ORDER — DIPHENHYDRAMINE HCL 12.5 MG/5ML PO ELIX: 12.5000 mg | ORAL_SOLUTION | ORAL | Status: DC | PRN

## 2020-03-17 MED ORDER — CEFAZOLIN SODIUM-DEXTROSE 2-4 GM/100ML-% IV SOLN
INTRAVENOUS | Status: AC
  Filled 2020-03-17: qty 100

## 2020-03-17 MED ORDER — SENNA 8.6 MG PO TABS
1.0000 | ORAL_TABLET | Freq: Two times a day (BID) | ORAL | Status: DC
  Administered 2020-03-17 – 2020-03-18 (×2): 8.6 mg via ORAL
  Filled 2020-03-17 (×2): qty 1

## 2020-03-17 MED ORDER — ROPIVACAINE HCL 7.5 MG/ML IJ SOLN
INTRAMUSCULAR | Status: DC | PRN
Start: 2020-03-17 — End: 2020-03-17
  Administered 2020-03-17: 20 mL via PERINEURAL

## 2020-03-17 MED ORDER — MORPHINE SULFATE (PF) 4 MG/ML IV SOLN
0.5000 mg | INTRAVENOUS | Status: DC | PRN
  Administered 2020-03-17 (×2): 1 mg via INTRAVENOUS
  Filled 2020-03-17 (×2): qty 1

## 2020-03-17 MED ORDER — ONDANSETRON HCL 4 MG/2ML IJ SOLN: 4.0000 mg | Freq: Four times a day (QID) | INTRAMUSCULAR | Status: DC | PRN

## 2020-03-17 MED ORDER — FENTANYL CITRATE (PF) 100 MCG/2ML IJ SOLN
INTRAMUSCULAR | Status: DC | PRN
  Administered 2020-03-17 (×2): 25 ug via INTRAVENOUS
  Administered 2020-03-17 (×2): 50 ug via INTRAVENOUS

## 2020-03-17 MED ORDER — ESMOLOL HCL 100 MG/10ML IV SOLN
INTRAVENOUS | Status: AC
  Filled 2020-03-17: qty 10

## 2020-03-17 MED ORDER — CEFAZOLIN SODIUM-DEXTROSE 2-4 GM/100ML-% IV SOLN
2.0000 g | Freq: Four times a day (QID) | INTRAVENOUS | Status: DC
  Administered 2020-03-17 – 2020-03-18 (×2): 2 g via INTRAVENOUS
  Filled 2020-03-17 (×2): qty 100

## 2020-03-17 MED ORDER — PHENYLEPHRINE HCL (PRESSORS) 10 MG/ML IV SOLN
INTRAVENOUS | Status: DC | PRN
Start: 2020-03-17 — End: 2020-03-17
  Administered 2020-03-17: 120 ug via INTRAVENOUS
  Administered 2020-03-17: 80 ug via INTRAVENOUS
  Administered 2020-03-17 (×2): 120 ug via INTRAVENOUS

## 2020-03-17 MED ORDER — MAGNESIUM CITRATE PO SOLN: 1.0000 | Freq: Once | ORAL | Status: DC | PRN

## 2020-03-17 MED ORDER — LORATADINE 10 MG PO TABS
10.0000 mg | ORAL_TABLET | Freq: Every day | ORAL | Status: DC
  Administered 2020-03-17 – 2020-03-18 (×2): 10 mg via ORAL
  Filled 2020-03-17 (×2): qty 1

## 2020-03-17 MED ORDER — PHENYLEPHRINE HCL-NACL 10-0.9 MG/250ML-% IV SOLN
INTRAVENOUS | Status: DC | PRN
  Administered 2020-03-17: 50 ug/min via INTRAVENOUS

## 2020-03-17 MED ORDER — MIDAZOLAM HCL 2 MG/2ML IJ SOLN
INTRAMUSCULAR | Status: AC
  Filled 2020-03-17: qty 2

## 2020-03-17 MED ORDER — CEFAZOLIN SODIUM-DEXTROSE 2-4 GM/100ML-% IV SOLN
2.0000 g | INTRAVENOUS | Status: AC
  Administered 2020-03-17 (×2): 2 g via INTRAVENOUS
  Filled 2020-03-17: qty 100

## 2020-03-17 MED ORDER — DOCUSATE SODIUM 100 MG PO CAPS
100.0000 mg | ORAL_CAPSULE | Freq: Two times a day (BID) | ORAL | Status: DC
  Administered 2020-03-17 – 2020-03-18 (×2): 100 mg via ORAL
  Filled 2020-03-17 (×2): qty 1

## 2020-03-17 MED ORDER — HYDROCODONE-ACETAMINOPHEN 5-325 MG PO TABS
1.0000 | ORAL_TABLET | ORAL | Status: DC | PRN
  Administered 2020-03-17: 2 via ORAL
  Filled 2020-03-17: qty 2

## 2020-03-17 MED ORDER — ONDANSETRON HCL 4 MG/2ML IJ SOLN
INTRAMUSCULAR | Status: DC | PRN
  Administered 2020-03-17: 4 mg via INTRAVENOUS

## 2020-03-17 MED ORDER — DEXAMETHASONE SODIUM PHOSPHATE 10 MG/ML IJ SOLN
INTRAMUSCULAR | Status: DC | PRN
  Administered 2020-03-17: 7 mg via INTRAVENOUS

## 2020-03-17 MED ORDER — SUGAMMADEX SODIUM 200 MG/2ML IV SOLN
INTRAVENOUS | Status: DC | PRN
  Administered 2020-03-17: 200 mg via INTRAVENOUS

## 2020-03-17 MED ORDER — LIDOCAINE 2% (20 MG/ML) 5 ML SYRINGE
INTRAMUSCULAR | Status: DC | PRN
  Administered 2020-03-17: 80 mg via INTRAVENOUS

## 2020-03-17 MED ORDER — ACETAMINOPHEN 500 MG PO TABS
1000.0000 mg | ORAL_TABLET | Freq: Once | ORAL | Status: AC
  Administered 2020-03-17: 1000 mg via ORAL
  Filled 2020-03-17: qty 2

## 2020-03-17 MED ORDER — POTASSIUM CHLORIDE IN NACL 20-0.45 MEQ/L-% IV SOLN
INTRAVENOUS | Status: DC
  Filled 2020-03-17 (×2): qty 1000

## 2020-03-17 MED ORDER — NITROGLYCERIN 0.4 MG SL SUBL: 0.4000 mg | SUBLINGUAL_TABLET | SUBLINGUAL | Status: DC | PRN

## 2020-03-17 MED ORDER — FENTANYL CITRATE (PF) 100 MCG/2ML IJ SOLN
INTRAMUSCULAR | Status: AC
  Filled 2020-03-17: qty 2

## 2020-03-17 MED ORDER — PROPOFOL 10 MG/ML IV BOLUS
INTRAVENOUS | Status: DC | PRN
  Administered 2020-03-17: 150 mg via INTRAVENOUS

## 2020-03-17 MED ORDER — METFORMIN HCL 500 MG PO TABS
1000.0000 mg | ORAL_TABLET | Freq: Two times a day (BID) | ORAL | Status: DC
  Administered 2020-03-17 – 2020-03-18 (×2): 1000 mg via ORAL
  Filled 2020-03-17 (×2): qty 2

## 2020-03-17 MED ORDER — PROPOFOL 10 MG/ML IV BOLUS
INTRAVENOUS | Status: AC
  Filled 2020-03-17: qty 20

## 2020-03-17 MED ORDER — ONDANSETRON HCL 4 MG PO TABS: 4.0000 mg | ORAL_TABLET | Freq: Three times a day (TID) | ORAL | 0 refills | Status: DC | PRN

## 2020-03-17 MED ORDER — HYDROCODONE-ACETAMINOPHEN 10-325 MG PO TABS: 1.0000 | ORAL_TABLET | Freq: Four times a day (QID) | ORAL | 0 refills | Status: DC | PRN

## 2020-03-17 MED ORDER — SENNA-DOCUSATE SODIUM 8.6-50 MG PO TABS
2.0000 | ORAL_TABLET | Freq: Every day | ORAL | 1 refills | Status: DC
Start: 2020-03-17 — End: 2020-07-20

## 2020-03-17 MED ORDER — METOCLOPRAMIDE HCL 5 MG/ML IJ SOLN: 5.0000 mg | Freq: Three times a day (TID) | INTRAMUSCULAR | Status: DC | PRN

## 2020-03-17 MED ORDER — METHOCARBAMOL 500 MG IVPB - SIMPLE MED
500.0000 mg | Freq: Four times a day (QID) | INTRAVENOUS | Status: DC | PRN
  Filled 2020-03-17: qty 50

## 2020-03-17 MED ORDER — CLONIDINE HCL (ANALGESIA) 100 MCG/ML EP SOLN
EPIDURAL | Status: DC | PRN
  Administered 2020-03-17: 100 ug

## 2020-03-17 MED ORDER — GLYCOPYRROLATE PF 0.2 MG/ML IJ SOSY
PREFILLED_SYRINGE | INTRAMUSCULAR | Status: AC
  Filled 2020-03-17: qty 2

## 2020-03-17 MED ORDER — ASPIRIN EC 81 MG PO TBEC
81.0000 mg | DELAYED_RELEASE_TABLET | Freq: Every day | ORAL | Status: DC
  Administered 2020-03-18: 81 mg via ORAL
  Filled 2020-03-17: qty 1

## 2020-03-17 MED ORDER — POLYETHYLENE GLYCOL 3350 17 G PO PACK: 17.0000 g | PACK | Freq: Every day | ORAL | Status: DC | PRN

## 2020-03-17 MED ORDER — FENTANYL CITRATE (PF) 100 MCG/2ML IJ SOLN
50.0000 ug | INTRAMUSCULAR | Status: DC
  Administered 2020-03-17: 50 ug via INTRAVENOUS
  Filled 2020-03-17: qty 2

## 2020-03-17 MED ORDER — 0.9 % SODIUM CHLORIDE (POUR BTL) OPTIME
TOPICAL | Status: DC | PRN
  Administered 2020-03-17: 1000 mL

## 2020-03-17 MED ORDER — SEMAGLUTIDE 7 MG PO TABS: 7.0000 mg | ORAL_TABLET | Freq: Every day | ORAL | Status: DC

## 2020-03-17 MED ORDER — BISACODYL 10 MG RE SUPP: 10.0000 mg | Freq: Every day | RECTAL | Status: DC | PRN

## 2020-03-17 MED ORDER — BACLOFEN 10 MG PO TABS
10.0000 mg | ORAL_TABLET | Freq: Three times a day (TID) | ORAL | 0 refills | Status: DC
Start: 2020-03-17 — End: 2020-07-20

## 2020-03-17 MED ORDER — ORAL CARE MOUTH RINSE: 15.0000 mL | Freq: Once | OROMUCOSAL | Status: AC

## 2020-03-17 MED ORDER — METOCLOPRAMIDE HCL 5 MG PO TABS: 5.0000 mg | ORAL_TABLET | Freq: Three times a day (TID) | ORAL | Status: DC | PRN

## 2020-03-17 MED ORDER — HYDROCODONE-ACETAMINOPHEN 7.5-325 MG PO TABS
1.0000 | ORAL_TABLET | ORAL | Status: DC | PRN
  Administered 2020-03-17 – 2020-03-18 (×2): 2 via ORAL
  Filled 2020-03-17 (×3): qty 2

## 2020-03-17 MED ORDER — EPHEDRINE SULFATE-NACL 50-0.9 MG/10ML-% IV SOSY
PREFILLED_SYRINGE | INTRAVENOUS | Status: DC | PRN
  Administered 2020-03-17: 10 mg via INTRAVENOUS

## 2020-03-17 MED ORDER — ROCURONIUM BROMIDE 10 MG/ML (PF) SYRINGE
PREFILLED_SYRINGE | INTRAVENOUS | Status: DC | PRN
  Administered 2020-03-17 (×2): 10 mg via INTRAVENOUS
  Administered 2020-03-17: 20 mg via INTRAVENOUS
  Administered 2020-03-17: 60 mg via INTRAVENOUS
  Administered 2020-03-17 (×2): 20 mg via INTRAVENOUS

## 2020-03-17 MED ORDER — CHLORHEXIDINE GLUCONATE 0.12 % MT SOLN
15.0000 mL | Freq: Once | OROMUCOSAL | Status: AC
  Administered 2020-03-17: 15 mL via OROMUCOSAL

## 2020-03-17 MED ORDER — KETOROLAC TROMETHAMINE 15 MG/ML IJ SOLN
7.5000 mg | Freq: Four times a day (QID) | INTRAMUSCULAR | Status: DC
  Administered 2020-03-17 – 2020-03-18 (×3): 7.5 mg via INTRAVENOUS
  Filled 2020-03-17 (×3): qty 1

## 2020-03-17 MED ORDER — LACTATED RINGERS IV SOLN: INTRAVENOUS | Status: DC

## 2020-03-17 MED ORDER — LOSARTAN POTASSIUM 50 MG PO TABS
100.0000 mg | ORAL_TABLET | Freq: Every day | ORAL | Status: DC
  Administered 2020-03-17 – 2020-03-18 (×2): 100 mg via ORAL
  Filled 2020-03-17 (×2): qty 2

## 2020-03-17 MED ORDER — MIDAZOLAM HCL 2 MG/2ML IJ SOLN
1.0000 mg | INTRAMUSCULAR | Status: DC
  Administered 2020-03-17: 2 mg via INTRAVENOUS
  Filled 2020-03-17: qty 2

## 2020-03-17 MED ORDER — ZOLPIDEM TARTRATE 5 MG PO TABS: 5.0000 mg | ORAL_TABLET | Freq: Every evening | ORAL | Status: DC | PRN

## 2020-03-17 MED ORDER — ATORVASTATIN CALCIUM 40 MG PO TABS
40.0000 mg | ORAL_TABLET | Freq: Every day | ORAL | Status: DC
  Administered 2020-03-18: 40 mg via ORAL
  Filled 2020-03-17: qty 1

## 2020-03-17 MED ORDER — ONDANSETRON HCL 4 MG PO TABS: 4.0000 mg | ORAL_TABLET | Freq: Four times a day (QID) | ORAL | Status: DC | PRN

## 2020-03-17 MED ORDER — METHOCARBAMOL 500 MG PO TABS
500.0000 mg | ORAL_TABLET | Freq: Four times a day (QID) | ORAL | Status: DC | PRN
  Administered 2020-03-17 – 2020-03-18 (×2): 500 mg via ORAL
  Filled 2020-03-17 (×2): qty 1

## 2020-03-17 SURGICAL SUPPLY — 84 items
APL SKNCLS STERI-STRIP NONHPOA (GAUZE/BANDAGES/DRESSINGS) ×2
BENZOIN TINCTURE PRP APPL 2/3 (GAUZE/BANDAGES/DRESSINGS) ×6 IMPLANT
BIT DRILL 2.2 SS TIBIAL (BIT) ×2 IMPLANT
BIT DRILL 3.2 (BIT) ×3
BIT DRILL 3.2XCALB NS DISP (BIT) IMPLANT
BIT DRILL CALIBRATED 2.7 (BIT) ×1 IMPLANT
BIT DRILL CALIBRATED 2.7MM (BIT) ×1
BIT DRL 3.2XCALB NS DISP (BIT) ×1
BNDG ELASTIC 4X5.8 VLCR STR LF (GAUZE/BANDAGES/DRESSINGS) ×2 IMPLANT
BNDG GAUZE ELAST 4 BULKY (GAUZE/BANDAGES/DRESSINGS) ×6 IMPLANT
CLOSURE STERI-STRIP 1/2X4 (GAUZE/BANDAGES/DRESSINGS) ×1
CLOSURE WOUND 1/4X4 (GAUZE/BANDAGES/DRESSINGS) ×1
CLSR STERI-STRIP ANTIMIC 1/2X4 (GAUZE/BANDAGES/DRESSINGS) ×1 IMPLANT
CORD BIPOLAR FORCEPS 12FT (ELECTRODE) ×2 IMPLANT
COVER SURGICAL LIGHT HANDLE (MISCELLANEOUS) ×3 IMPLANT
COVER WAND RF STERILE (DRAPES) IMPLANT
DRAPE C-ARM 42X120 X-RAY (DRAPES) ×3 IMPLANT
DRAPE INCISE IOBAN 66X45 STRL (DRAPES) IMPLANT
DRAPE SURG 17X11 SM STRL (DRAPES) ×6 IMPLANT
DRAPE U-SHAPE 47X51 STRL (DRAPES) ×3 IMPLANT
DRSG ADAPTIC 3X8 NADH LF (GAUZE/BANDAGES/DRESSINGS) ×3 IMPLANT
DRSG MEPILEX BORDER 4X8 (GAUZE/BANDAGES/DRESSINGS) ×2 IMPLANT
DRSG PAD ABDOMINAL 8X10 ST (GAUZE/BANDAGES/DRESSINGS) ×6 IMPLANT
ELECT REM PT RETURN 15FT ADLT (MISCELLANEOUS) ×3 IMPLANT
EVACUATOR 1/8 PVC DRAIN (DRAIN) IMPLANT
GAUZE SPONGE 4X4 12PLY STRL (GAUZE/BANDAGES/DRESSINGS) ×4 IMPLANT
GLOVE BIO SURGEON STRL SZ7.5 (GLOVE) ×5 IMPLANT
GLOVE BIOGEL PI IND STRL 8 (GLOVE) ×1 IMPLANT
GLOVE BIOGEL PI INDICATOR 8 (GLOVE) ×4
GOWN STRL REUS W/TWL LRG LVL3 (GOWN DISPOSABLE) ×10 IMPLANT
K-WIRE 1.6 (WIRE) ×9
K-WIRE 2X5 SS THRDED S3 (WIRE) ×9
K-WIRE FX5X1.6XNS BN SS (WIRE) ×3
KIT BASIN (CUSTOM PROCEDURE TRAY) ×3 IMPLANT
KIT TURNOVER KIT A (KITS) IMPLANT
KWIRE 2X5 SS THRDED S3 (WIRE) IMPLANT
KWIRE FX5X1.6XNS BN SS (WIRE) IMPLANT
MANIFOLD NEPTUNE II (INSTRUMENTS) ×3 IMPLANT
NS IRRIG 1000ML POUR BTL (IV SOLUTION) ×3 IMPLANT
PACK ORTHO EXTREMITY (CUSTOM PROCEDURE TRAY) ×3 IMPLANT
PAD CAST 4YDX4 CTTN HI CHSV (CAST SUPPLIES) IMPLANT
PADDING CAST COTTON 4X4 STRL (CAST SUPPLIES) ×3
PEG LOCKING 3.2MMX54 (Peg) ×2 IMPLANT
PEG LOCKING 3.2X32 (Peg) ×2 IMPLANT
PEG LOCKING 3.2X36 (Screw) ×2 IMPLANT
PEG LOCKING 3.2X40 (Peg) ×4 IMPLANT
PEG LOCKING 3.2X42 (Screw) ×2 IMPLANT
PEG LOCKING 3.2X50 (Screw) ×2 IMPLANT
PEG LOCKING SMOOTH 2.2X18 (Peg) ×4 IMPLANT
PEG LOCKING SMOOTH 2.2X20 (Screw) ×10 IMPLANT
PEG LOCKING SMOOTH 2.2X22 (Screw) ×2 IMPLANT
PENCIL SMOKE EVACUATOR (MISCELLANEOUS) IMPLANT
PLATE 3HOLE HUMERUS PROX RT (Plate) ×2 IMPLANT
PLATE CROSSLOCK MINI RT (Plate) ×2 IMPLANT
PROTECTOR NERVE ULNAR (MISCELLANEOUS) ×3 IMPLANT
SCREW LOCK 18X2.7X 3 LD TPR (Screw) IMPLANT
SCREW LOCK 20X2.7X 3 LD TPR (Screw) IMPLANT
SCREW LOCK CORT STAR 3.5X28 (Screw) ×2 IMPLANT
SCREW LOCKING 2.7X18 (Screw) ×3 IMPLANT
SCREW LOCKING 2.7X20MM (Screw) ×3 IMPLANT
SCREW LOW PROF TIS 3.5X28MM (Screw) ×2 IMPLANT
SCREW LOW PROFILE 3.5X30MM TIS (Screw) ×2 IMPLANT
SLING ARM IMMOBILIZER LRG (SOFTGOODS) ×3 IMPLANT
SLING ARM IMMOBILIZER MED (SOFTGOODS) IMPLANT
SOL PREP POV-IOD 4OZ 10% (MISCELLANEOUS) ×3 IMPLANT
SOL PREP PROV IODINE SCRUB 4OZ (MISCELLANEOUS) ×3 IMPLANT
SPONGE LAP 18X18 RF (DISPOSABLE) IMPLANT
STAPLER VISISTAT 35W (STAPLE) IMPLANT
STOCKINETTE 8 INCH (MISCELLANEOUS) ×3 IMPLANT
STRIP CLOSURE SKIN 1/4X4 (GAUZE/BANDAGES/DRESSINGS) ×1 IMPLANT
SUCTION FRAZIER HANDLE 10FR (MISCELLANEOUS) ×3
SUCTION TUBE FRAZIER 10FR DISP (MISCELLANEOUS) ×1 IMPLANT
SUT ETHIBOND 2 0 SH (SUTURE) ×3
SUT ETHIBOND 2 0 SH 36X2 (SUTURE) ×1 IMPLANT
SUT ETHILON 3 0 PS 1 (SUTURE) ×2 IMPLANT
SUT FIBERWIRE #2 38 REV NDL BL (SUTURE) ×6
SUT MNCRL AB 4-0 PS2 18 (SUTURE) ×3 IMPLANT
SUT VIC AB 0 CT1 36 (SUTURE) ×3 IMPLANT
SUT VIC AB 3-0 SH 18 (SUTURE) ×3 IMPLANT
SUT VIC AB 4-0 PS2 27 (SUTURE) ×3 IMPLANT
SUTURE FIBERWR#2 38 REV NDL BL (SUTURE) IMPLANT
TOWEL OR 17X26 10 PK STRL BLUE (TOWEL DISPOSABLE) ×8 IMPLANT
TRAY FOLEY MTR SLVR 16FR STAT (SET/KITS/TRAYS/PACK) IMPLANT
WATER STERILE IRR 1000ML POUR (IV SOLUTION) ×3 IMPLANT

## 2020-03-17 NOTE — H&P (Signed)
PREOPERATIVE H&P  Chief Complaint: Right arm pain  HPI: Tim Cook is a 59 y.o. male who had a right proximal humerus fracture about a year ago, and then had a fall at work just recently and had a proximal humerus fracture, with displacement, distal radius fracture, and he had some pre-existing carpal tunnel syndrome, however this has gotten significantly worse from his distal radius fracture.  He complains of acute severe pain and inability to use his right arm.  Denies any loss of consciousness.  Denies any cardiac symptoms.  Past Medical History:  Diagnosis Date  . Arthritis    "knees, lower back, hands" (05/18/2016)  . CAD (coronary artery disease), native coronary artery    05/18/16  Cath severe 90% LAD prox, 99% mid, Circ dominant with 30% OM1, 50% OM2, 30% OM3, 60% distal circ and PL, severe stenosis nondominant RCA.  EF 55%  3.0x 20, 2.5 x 20 mm, 2.25 x 16 mm Promus premier sent to LAD Dr. Clifton James   . Chronic bronchitis (HCC)    "most years" (05/18/2016)  . Chronic lower back pain   . DDD (degenerative disc disease), lumbar   . Hyperlipidemia   . Sleep apnea    "dx'd; never wore mask" (05/18/2016)  . Squamous carcinoma    cut off right upper arm, left shoulder; froze off nose and right side of face  . Type II diabetes mellitus (HCC)   . Unstable angina (HCC)   . Vomiting    Past Surgical History:  Procedure Laterality Date  . CARDIAC CATHETERIZATION N/A 05/18/2016   Procedure: Left Heart Cath and Coronary Angiography;  Surgeon: Kathleene Hazel, MD;  Location: [facility] INVASIVE CV LAB;  Service: Cardiovascular;  Laterality: N/A;  . CARDIAC CATHETERIZATION N/A 05/18/2016   Procedure: Coronary Stent Intervention;  Surgeon: Kathleene Hazel, MD;  Location: [facility] INVASIVE CV LAB;  Service: Cardiovascular;  Laterality: N/A;  . CORONARY ANGIOPLASTY    . FOOT FRACTURE SURGERY Right 1970s  . LAPAROSCOPIC GASTRIC BANDING  07/2007   by Dr. Daphine Deutscher  . PILONIDAL CYST EXCISION  ~  1985   "took 8 out all at once"  . SQUAMOUS CELL CARCINOMA EXCISION     right upper arm, left shoulder   Social History   Socioeconomic History  . Marital status: Married    Spouse name: Not on file  . Number of children: Not on file  . Years of education: Not on file  . Highest education level: Not on file  Occupational History  . Not on file  Tobacco Use  . Smoking status: Former Smoker    Packs/day: 0.50    Years: 12.00    Pack years: 6.00    Types: Cigarettes    Quit date: 12/15/1991    Years since quitting: 28.2  . Smokeless tobacco: Former Neurosurgeon    Types: Snuff  . Tobacco comment: "quit snuff in ~ 1981"  Vaping Use  . Vaping Use: Never used  Substance and Sexual Activity  . Alcohol use: Yes    Alcohol/week: 7.0 standard drinks    Types: 7 Cans of beer per week    Comment: More social use  . Drug use: No  . Sexual activity: Yes  Other Topics Concern  . Not on file  Social History Narrative  . Not on file   Social Determinants of Health   Financial Resource Strain:   . Difficulty of Paying Living Expenses:   Food Insecurity:   . Worried About Radiation protection practitioner  of Food in the Last Year:   . Ran Out of Food in the Last Year:   Transportation Needs:   . Lack of Transportation (Medical):   Marland Kitchen Lack of Transportation (Non-Medical):   Physical Activity:   . Days of Exercise per Week:   . Minutes of Exercise per Session:   Stress:   . Feeling of Stress :   Social Connections:   . Frequency of Communication with Friends and Family:   . Frequency of Social Gatherings with Friends and Family:   . Attends Religious Services:   . Active Member of Clubs or Organizations:   . Attends Banker Meetings:   Marland Kitchen Marital Status:    Family History  Problem Relation Age of Onset  . Heart disease Mother   . Heart disease Father   . Stroke Father   . Heart attack Father   . Heart disease Sister   . Diabetes Mellitus I Sister   . Stroke Brother    No Known  Allergies Prior to Admission medications   Medication Sig Start Date End Date Taking? Authorizing Provider  aspirin EC 81 MG tablet Take 81 mg by mouth daily.   Yes [provider]  atorvastatin (LIPITOR) 40 MG tablet  05/02/16  Yes [provider]  diclofenac sodium (VOLTAREN) 1 % GEL Apply 2 g topically 4 (four) times daily. To affected joint. Patient taking differently: Apply 2 g topically 3 (three) times daily as needed (joint pain).  03/25/19  Yes Antoine Primas M, DO  FARXIGA 10 MG TABS tablet Take 10 mg by mouth daily.  01/06/19  Yes [provider]  fexofenadine (ALLEGRA) 180 MG tablet Take 180 mg by mouth daily.   Yes [provider]  gabapentin (NEURONTIN) 100 MG capsule Take 2 capsules (200 mg total) by mouth at bedtime. Patient taking differently: Take 200 mg by mouth at bedtime as needed (pain).  03/06/20  Yes Judi Saa, DO  HYDROcodone-acetaminophen (NORCO/VICODIN) 5-325 MG tablet Take 1 tablet by mouth every 8 (eight) hours as needed for pain. 03/09/20  Yes [provider]  losartan (COZAAR) 100 MG tablet 100 mg.  05/02/16  Yes [provider]  metFORMIN (GLUCOPHAGE) 500 MG tablet Take 1,000 mg by mouth 2 (two) times daily with a meal.  05/02/16  Yes [provider]  nitroGLYCERIN (NITROSTAT) 0.4 MG SL tablet Place 1 tablet (0.4 mg total) under the tongue every 5 (five) minutes as needed for chest pain. Please give 90 day supply in the small bottles 05/03/19  Yes Georgeanna Lea, MD  RYBELSUS 7 MG TABS Take 7 mg by mouth daily. 02/21/20  Yes [provider]  colchicine 0.6 MG tablet Take 1 tablet (0.6 mg total) by mouth 2 (two) times daily. Patient not taking: Reported on 03/12/2020 02/25/19   Judi Saa, DO  Multiple Vitamin (MULTIVITAMIN) capsule Take 1 capsule by mouth daily.    [provider]  oxyCODONE-acetaminophen (PERCOCET/ROXICET) 5-325 MG tablet Take 1 tablet by mouth every 6 (six) hours as  needed for severe pain. Patient not taking: Reported on 03/12/2020 03/28/19   Charlestine Night, PA-C  predniSONE (DELTASONE) 50 MG tablet Take one tablet daily for the next 5 days. Patient not taking: Reported on 03/12/2020 03/06/20   Judi Saa, DO  sildenafil (VIAGRA) 25 MG tablet Take 1 tablet (25 mg total) by mouth daily as needed for erectile dysfunction. Patient not taking: Reported on 03/12/2020 02/25/19   Georgeanna Lea,  MD  tiZANidine (ZANAFLEX) 4 MG tablet Take 1 tablet (4 mg total) by mouth at bedtime. Patient not taking: Reported on 03/12/2020 03/06/20   [name], DO  Vitamin D, Ergocalciferol, (DRISDOL) 1.25 MG (50000 UT) CAPS capsule TAKE 1 CAPSULE EVERY 7 DAYS Patient not taking: Reported on 03/12/2020 05/06/19   [name], DO     Positive ROS: All other systems have been reviewed and were otherwise negative with the exception of those mentioned in the HPI and as above.  Physical Exam: General: Alert, no acute distress Cardiovascular: No pedal edema Respiratory: No cyanosis, no use of accessory musculature GI: No organomegaly, abdomen is soft and non-tender Skin: No lesions in the area of chief complaint Neurologic: Sensation intact distally Psychiatric: Patient is competent for consent with normal mood and affect Lymphatic: No axillary or cervical lymphadenopathy  MUSCULOSKELETAL: Right shoulder has significant ecchymosis, all fingers flex extend and abduct, positive pain over the distal radius, mild decrease sensation in the median nerve distribution.  Assessment: 1.  Right proximal humerus fracture with displacement, recurrent 2.  Right distal radius fracture with intra-articular split and angulation 3.  Right carpal tunnel syndrome, acute on chronic   Plan: Plan for Procedure(s): OPEN REDUCTION INTERNAL FIXATION (ORIF) PROXIMAL HUMERUS FRACTURE AND DISTAL RADIUS FRACTURE CARPAL TUNNEL RELEASE  The risks benefits and alternatives were discussed  with the patient including but not limited to the risks of nonoperative treatment, versus surgical intervention including infection, bleeding, nerve injury, malunion, nonunion, the need for revision surgery, hardware prominence, hardware failure, the need for hardware removal, blood clots, cardiopulmonary complications, morbidity, mortality, among others, and they were willing to proceed.      Patient's anticipated LOS is less than 2 midnights, meeting these requirements: - Younger than 43 - Lives within 1 hour of care - Has a competent adult at home to recover with post-op recover - NO history of  - Chronic pain requiring opiods  - Diabetes  - Coronary Artery Disease  - Heart failure  - Heart attack  - Stroke  - DVT/VTE  - Cardiac arrhythmia  - Respiratory Failure/COPD  - Renal failure  - Anemia  - Advanced Liver disease        Johnny Bridge, MD Cell [phone]   03/17/2020 9:43 AM

## 2020-03-17 NOTE — Op Note (Signed)
03/17/2020  1:07 PM  PATIENT:  Tim Cook    PRE-OPERATIVE DIAGNOSIS:    1.  RIGHT DISTAL RADIUS FRACTURE 2.  RIGHT PROXIMAL HUMERUS FRACTURE 3.  RIGHT CARPAL TUNNEL SYNDROME  POST-OPERATIVE DIAGNOSIS:  Same  PROCEDURE:    1.  OPEN REDUCTION INTERNAL FIXATION (ORIF) RIGHT PROXIMAL HUMERUS FRACTURE 2.  ORIF RIGHT DISTAL RADIUS FRACTURE, > 3 PIECES 3.  RIGHT CARPAL TUNNEL RELEASE  SURGEON:  Eulas Post, MD  PHYSICIAN ASSISTANT: Janine Ores, PA-C, present and scrubbed throughout the case, critical for completion in a timely fashion, and for retraction, instrumentation, and closure.  ANESTHESIA:   General  ESTIMATED BLOOD LOSS: 150 ml  PREOPERATIVE INDICATIONS:  Tim Cook is a  59 y.o. male who broke his shoulder about 6 months ago, healed, and went back to work and then fell and rebroke his shoulder, and also his wrist.  He also had carpal tunnel syndrome, which was worsened by his wrist fracture.  He elected for surgical management.   The risks benefits and alternatives were discussed with the patient including but not limited to the risks of nonoperative treatment, versus surgical intervention including infection, bleeding, nerve injury, malunion, nonunion, the need for revision surgery, hardware prominence, hardware failure, the need for hardware removal, blood clots, cardiopulmonary complications, conversion to arthroplasty, morbidity, mortality, among others, and they were willing to proceed.  Predicted outcome is good, although there will be at least a six to nine month expected recovery.   OPERATIVE IMPLANTS: Biomet ALPS proximal humerus locking plate.  DVR volar plate for the wrist.  OPERATIVE FINDINGS: Displaced proximal humerus fracture with some hypertrophic bone from his previous fracture.  The median nerve was tight and had slight atrophy.  No masses in the carpal tunnel.  The distal radius had comminution and intraarticular extension.  There was an odd  contour to the volar cortex, and I am suspicious that he has broken his distal radius before.  The ulnar syloid also had sclerosis concerning for an old fracture.    UNIQUE ASPECTS OF THE CASE: I restored the relationship of the humeral head to the shaft, although I suspect he had some fracture gap because I think he broke through his previous fracture, and was malunited.  OPERATIVE PROCEDURE: The patient was brought to the operating room and placed in the supine position. General anesthesia was administered. IV antibiotics were given. He was placed in the beach chair position. All bony prominences were padded. The upper extremity was prepped and draped in usual sterile fashion. Deltopectoral incision was performed.  I exposed the fracture site, and placed deep retractors. I did not tenotomize the biceps tendon. This was left in place. I elevated a small portion of the deltoid off of the shaft, in order to gain access for the plate. I placed supraspinatus and subscapularis stitches, and then reduced the head onto the shaft. This was maintained in satisfactory position.  I applied the plate and secured it into the sliding hole first. I confirmed position of the reduction and the plate with C-arm, and I placed a total of 2 guidewires into the appropriate position in the head. I was satisfied that the plate was distal appropriately, and then secured the plate proximally with smooth pegs, taking care to prevent penetration into the arch articular surface, using C-arm, as well as manual feel using a hand drill.  I then secured the plate distally using another cortical screw. I then passed the FiberWire sutures from the subscapularis  and supraspinatus through the plate and secured the tuberosities. Once complete fixation and reduction of been achieved, took final C-arm pictures, and irrigated the wounds copiously, and repaired the deltopectoral interval with Vicryl followed by Vicryl for the subcutaneous tissue  with Monocryl and Steri-Strips for the skin. He was placed in a sling. He had a preoperative regional block as well. He tolerated the procedure well with no complications.  I then turned my attention to the distal radius and the carpal tunnel  Under a separate sterile prep and drape, the right upper extremity was elevated and exsanguinated, tourniquet was inflated, an incision was made over the carpal tunnel in line with the radial border of the ring finger.  Dissection carried down, the carpal tunnel was opened, care was taken to protect the motor branch of the median nerve.  The median nerve was identified, dissection carried down proximally and distally taking care not to extend beyond Kaplan's line, and I released the carpal tunnel up to the level of the distal forearm fascia.  Complete release was achieved, and then I turned my attention to the distal radius.  Volar approach to the distal radius carried out, I dissected through the dorsal sheath of the flexor carpi radialis, and then deep retractors placed.  The radial artery and median nerve were retracted were protected.  Deep dissection was carried down, and the pronator quadratus was elevated off of the radius. The fracture site was identified and cleaned and reduced anatomically. This keyed into place nicely.   I held this provisionally with a K wire, and C-arm used to confirm alignment.   I had to work on the ulnar corner to optimize.    I had restored height and inclination and then applied a volar plate. A K wire was used to confirm appropriate position of the plate, and once I was satisfied with the overall alignment I was able to secure the plate proximally with a cortical screw.   I then secured the fracture with multiple smooth interlocking pegs distally, and confirmed that none of these were in the joint, and none of these were penetrating the dorsal cortex. I also secured the plate proximally with one more cortical screw. On the  ulnar side of the plate distally I used shorter pegs than on the radial side.    The wounds were irrigated copiously, and I repaired the subcutaneous tissue with 3-0 subcutaneous Vicryl for the skin and Steri-Strips and sterile gauze and a volar splint. I also used nylon for the carpal tunnel incision.  The tourniquet was released. He was awakened and returned back in stable and satisfactory condition. There were no complications and He tolerated the procedure well.

## 2020-03-17 NOTE — Anesthesia Postprocedure Evaluation (Signed)
Anesthesia Post Note  Patient: MERLON ALCORTA  Procedure(s) Performed: OPEN REDUCTION INTERNAL FIXATION (ORIF) PROXIMAL HUMERUS FRACTURE AND DISTAL RADIUS FRACTURE (Right ) CARPAL TUNNEL RELEASE (Right )     Patient location during evaluation: PACU Anesthesia Type: General Level of consciousness: awake and alert and oriented Pain management: pain level controlled Vital Signs Assessment: post-procedure vital signs reviewed and stable Respiratory status: spontaneous breathing, nonlabored ventilation and respiratory function stable Cardiovascular status: blood pressure returned to baseline and stable Postop Assessment: no apparent nausea or vomiting Anesthetic complications: no   No complications documented.  Last Vitals:  Vitals:   03/17/20 1645 03/17/20 1656  BP: (!) 147/79 (!) 150/89  Pulse: 91 91  Resp: 16 16  Temp: 36.4 C 36.6 C  SpO2: 98% 96%    Last Pain:  Vitals:   03/17/20 1729  TempSrc:   PainSc: 9                  Karley Pho A.

## 2020-03-17 NOTE — Transfer of Care (Signed)
Immediate Anesthesia Transfer of Care Note  Patient: Tim Cook  Procedure(s) Performed: OPEN REDUCTION INTERNAL FIXATION (ORIF) PROXIMAL HUMERUS FRACTURE AND DISTAL RADIUS FRACTURE (Right ) CARPAL TUNNEL RELEASE (Right )  Patient Location: PACU  Anesthesia Type:GA combined with regional for post-op pain  Level of Consciousness: awake, oriented, patient cooperative and responds to stimulation  Airway & Oxygen Therapy: Patient Spontanous Breathing and Patient connected to face mask oxygen  Post-op Assessment: Report given to RN and Post -op Vital signs reviewed and stable  Post vital signs: Reviewed and stable  Last Vitals:  Vitals Value Taken Time  BP 148/80 03/17/20 1549  Temp    Pulse 105 03/17/20 1553  Resp 16 03/17/20 1553  SpO2 93 % 03/17/20 1553  Vitals shown include unvalidated device data.  Last Pain:  Vitals:   03/17/20 1018  TempSrc:   PainSc: 5       Patients Stated Pain Goal: 4 (03/17/20 1018)  Complications: No complications documented.

## 2020-03-17 NOTE — Anesthesia Procedure Notes (Signed)
Anesthesia Regional Block: Interscalene brachial plexus block   Pre-Anesthetic Checklist: ,, timeout performed, Correct Patient, Correct Site, Correct Laterality, Correct Procedure, Correct Position, site marked, Risks and benefits discussed,  Surgical consent,  Pre-op evaluation,  At surgeon's request and post-op pain management  Laterality: Right  Prep: chloraprep       Needles:  Injection technique: Single-shot  Needle Type: Echogenic Stimulator Needle     Needle Length: 5cm  Needle Gauge: 22     Additional Needles:   Procedures:,,,, ultrasound used (permanent image in chart),,,,  Narrative:  Start time: 03/17/2020 10:12 AM End time: 03/17/2020 10:22 AM Injection made incrementally with aspirations every 5 mL.  Performed by: Personally  Anesthesiologist: Cecile Hearing, MD  Additional Notes: Functioning IV was confirmed and monitors were applied.  A 37mm 22ga Arrow echogenic stimulator needle was used. Sterile prep and drape, hand hygiene, and sterile gloves were used.  Negative aspiration and negative test dose prior to incremental administration of local anesthetic. The patient tolerated the procedure well.  Ultrasound guidance: relevent anatomy identified, needle position confirmed, local anesthetic spread visualized around nerve(s), vascular puncture avoided.  Image printed for medical record.

## 2020-03-17 NOTE — Anesthesia Procedure Notes (Signed)
Procedure Name: Intubation Date/Time: 03/17/2020 11:00 AM Performed by: West Pugh, CRNA Pre-anesthesia Checklist: Patient identified, Emergency Drugs available, Suction available, Patient being monitored and Timeout performed Patient Re-evaluated:Patient Re-evaluated prior to induction Oxygen Delivery Method: Circle system utilized Preoxygenation: Pre-oxygenation with 100% oxygen Induction Type: IV induction Ventilation: Two handed mask ventilation required and Oral airway inserted - appropriate to patient size Laryngoscope Size: Mac and 4 Grade View: Grade I Tube type: Oral Tube size: 7.5 mm Number of attempts: 1 Airway Equipment and Method: Stylet Placement Confirmation: ETT inserted through vocal cords under direct vision,  positive ETCO2,  CO2 detector and breath sounds checked- equal and bilateral Secured at: 22 cm Tube secured with: Tape Dental Injury: Teeth and Oropharynx as per pre-operative assessment

## 2020-03-17 NOTE — Progress Notes (Signed)
Assisted Dr. Turk with right, ultrasound guided, interscalene  block. Side rails up, monitors on throughout procedure. See vital signs in flow sheet. Tolerated Procedure well. 

## 2020-03-17 NOTE — Anesthesia Preprocedure Evaluation (Signed)
Anesthesia Evaluation  Patient identified by MRN, date of birth, ID band Patient awake    Reviewed: Allergy & Precautions, NPO status , Patient's Chart, lab work & pertinent test results  Airway Mallampati: II  TM Distance: >3 FB Neck ROM: Full    Dental  (+) Teeth Intact, Dental Advisory Given   Pulmonary sleep apnea , former smoker,    Pulmonary exam normal breath sounds clear to auscultation       Cardiovascular hypertension, Pt. on medications + CAD and + Cardiac Stents  Normal cardiovascular exam Rhythm:Regular Rate:Normal     Neuro/Psych negative neurological ROS     GI/Hepatic negative GI ROS, Neg liver ROS,   Endo/Other  diabetes, Type 2, Oral Hypoglycemic AgentsObesity   Renal/GU negative Renal ROS     Musculoskeletal negative musculoskeletal ROS (+)   Abdominal   Peds  Hematology negative hematology ROS (+)   Anesthesia Other Findings Day of surgery medications reviewed with the patient.  Reproductive/Obstetrics                             Anesthesia Physical Anesthesia Plan  ASA: III  Anesthesia Plan: General   Post-op Pain Management:  Regional for Post-op pain   Induction: Intravenous  PONV Risk Score and Plan: 2 and Midazolam, Dexamethasone and Ondansetron  Airway Management Planned: Oral ETT  Additional Equipment:   Intra-op Plan:   Post-operative Plan: Extubation in OR  Informed Consent: I have reviewed the patients History and Physical, chart, labs and discussed the procedure including the risks, benefits and alternatives for the proposed anesthesia with the patient or authorized representative who has indicated his/her understanding and acceptance.     Dental advisory given  Plan Discussed with: CRNA  Anesthesia Plan Comments: (Risks/benefits of general anesthesia discussed with patient including risk of damage to teeth, lips, gum, and tongue,  nausea/vomiting, allergic reactions to medications, and the possibility of heart attack, stroke and death.  All patient questions answered.  Patient wishes to proceed.)        Anesthesia Quick Evaluation

## 2020-03-17 NOTE — Discharge Instructions (Signed)
Diet: As you were doing prior to hospitalization   Shower:  May shower but keep the wounds dry, use an occlusive plastic wrap, NO SOAKING IN TUB.  If the bandage gets wet, change with a clean dry gauze.  If you have a splint on, leave the splint in place and keep the splint dry with a plastic bag.  Dressing:  You may change your dressing 3-5 days after surgery, unless you have a splint.  If you have a splint, then just leave the splint in place and we will change your bandages during your first follow-up appointment.    If you had hand or foot surgery, we will plan to remove your stitches in about 2 weeks in the office.  For all other surgeries, there are sticky tapes (steri-strips) on your wounds and all the stitches are absorbable.  Leave the steri-strips in place when changing your dressings, they will peel off with time, usually 2-3 weeks.  Activity:  Increase activity slowly as tolerated, but follow the weight bearing instructions below.  The rules on driving is that you can not be taking narcotics while you drive, and you must feel in control of the vehicle.    Weight Bearing:   Non weight bearing right arm  To prevent constipation: you may use a stool softener such as -  Colace (over the counter) 100 mg by mouth twice a day  Drink plenty of fluids (prune juice may be helpful) and high fiber foods Miralax (over the counter) for constipation as needed.    Itching:  If you experience itching with your medications, try taking only a single pain pill, or even half a pain pill at a time.  You may take up to 10 pain pills per day, and you can also use benadryl over the counter for itching or also to help with sleep.   Precautions:  If you experience chest pain or shortness of breath - call 911 immediately for transfer to the hospital emergency department!!  If you develop a fever greater that 101 F, purulent drainage from wound, increased redness or drainage from wound, or calf pain -- Call the  office at [phone]                                                Follow- Up Appointment:  Please call for an appointment to be seen in 2 weeks [place] - [phone]     

## 2020-03-18 ENCOUNTER — Encounter (HOSPITAL_COMMUNITY): Payer: Self-pay | Admitting: Orthopedic Surgery

## 2020-03-18 DIAGNOSIS — S42291A Other displaced fracture of upper end of right humerus, initial encounter for closed fracture: Secondary | ICD-10-CM | POA: Diagnosis not present

## 2020-03-18 LAB — BASIC METABOLIC PANEL
Anion gap: 10 (ref 5–15)
BUN: 24 mg/dL — ABNORMAL HIGH (ref 6–20)
CO2: 24 mmol/L (ref 22–32)
Calcium: 8.2 mg/dL — ABNORMAL LOW (ref 8.9–10.3)
Chloride: 98 mmol/L (ref 98–111)
Creatinine, Ser: 1.12 mg/dL (ref 0.61–1.24)
GFR calc Af Amer: >60 mL/min (ref >60)
GFR calc non Af Amer: >60 mL/min (ref >60)
Glucose, Bld: 135 mg/dL — ABNORMAL HIGH (ref 70–99)
Potassium: 4.2 mmol/L (ref 3.5–5.1)
Sodium: 132 mmol/L — ABNORMAL LOW (ref 135–145)

## 2020-03-18 LAB — CBC
HCT: 36.4 % — ABNORMAL LOW (ref 39.0–52.0)
Hemoglobin: 12.2 g/dL — ABNORMAL LOW (ref 13.0–17.0)
MCH: 31.5 pg (ref 26.0–34.0)
MCHC: 33.5 g/dL (ref 30.0–36.0)
MCV: 94.1 fL (ref 80.0–100.0)
Platelets: 342 10*3/uL (ref 150–400)
RBC: 3.87 MIL/uL — ABNORMAL LOW (ref 4.22–5.81)
RDW: 12.3 % (ref 11.5–15.5)
WBC: 11.4 10*3/uL — ABNORMAL HIGH (ref 4.0–10.5)
nRBC: 0 % (ref 0.0–0.2)

## 2020-03-18 LAB — GLUCOSE, CAPILLARY: Glucose-Capillary: 139 mg/dL — ABNORMAL HIGH (ref 70–99)

## 2020-03-18 NOTE — Progress Notes (Signed)
Subjective: 1 Day Post-Op s/p Procedure(s): OPEN REDUCTION INTERNAL FIXATION (ORIF) PROXIMAL HUMERUS FRACTURE AND DISTAL RADIUS FRACTURE CARPAL TUNNEL RELEASE   Patient is alert, oriented, sitting up in bed. Reports pain is moderate this morning, starting to have some throbbing in his wrist and hand and getting feeling back in his hand and forearm. Had a restless night with pain due to foley but this has resolved since voiding this morning.     Objective:  PE: VITALS:   Vitals:   03/17/20 2004 03/17/20 2207 03/18/20 0155 03/18/20 0610  BP: (!) 142/75 128/77 (!) 141/76 (!) 145/86  Pulse: (!) 106 (!) 104 (!) 101 92  Resp: 18 18 18 18   Temp: 98.3 F (36.8 C) 98.7 F (37.1 C) 97.9 F (36.6 C) 98.3 F (36.8 C)  TempSrc: Oral Oral Oral Oral  SpO2: 100% 97% 97% 100%  Weight:      Height:       General: alert, oriented, in no acute distress GI: abdomen soft, non-tender MSK: Right short arm splint in place. Distal sensation intact. Able to flex and extend all fingers in right hand. R shoulder incision appears well approximated. Dressing with cant drainage, changed today. Ecchymosis at distal humerus from original injury.   LABS  Results for orders placed or performed during the hospital encounter of 03/17/20 (from the past 24 hour(s))  Glucose, capillary     Status: Abnormal   Collection Time: 03/17/20 10:10 AM  Result Value Ref Range   Glucose-Capillary 133 (H) 70 - 99 mg/dL  Glucose, capillary     Status: Abnormal   Collection Time: 03/17/20  2:28 PM  Result Value Ref Range   Glucose-Capillary 137 (H) 70 - 99 mg/dL  Glucose, capillary     Status: Abnormal   Collection Time: 03/17/20  4:05 PM  Result Value Ref Range   Glucose-Capillary 154 (H) 70 - 99 mg/dL  Glucose, capillary     Status: Abnormal   Collection Time: 03/17/20 10:02 PM  Result Value Ref Range   Glucose-Capillary 159 (H) 70 - 99 mg/dL  Basic metabolic panel     Status: Abnormal   Collection Time:  03/18/20  5:53 AM  Result Value Ref Range   Sodium 132 (L) 135 - 145 mmol/L   Potassium 4.2 3.5 - 5.1 mmol/L   Chloride 98 98 - 111 mmol/L   CO2 24 22 - 32 mmol/L   Glucose, Bld 135 (H) 70 - 99 mg/dL   BUN 24 (H) 6 - 20 mg/dL   Creatinine, Ser 03/20/20 0.61 - 1.24 mg/dL   Calcium 8.2 (L) 8.9 - 10.3 mg/dL   GFR calc non Af Amer >60 >60 mL/min   GFR calc Af Amer >60 >60 mL/min   Anion gap 10 5 - 15  CBC     Status: Abnormal   Collection Time: 03/18/20  5:53 AM  Result Value Ref Range   WBC 11.4 (H) 4.0 - 10.5 K/uL   RBC 3.87 (L) 4.22 - 5.81 MIL/uL   Hemoglobin 12.2 (L) 13.0 - 17.0 g/dL   HCT 03/20/20 (L) 39 - 52 %   MCV 94.1 80.0 - 100.0 fL   MCH 31.5 26.0 - 34.0 pg   MCHC 33.5 30.0 - 36.0 g/dL   RDW 09.3 81.8 - 29.9 %   Platelets 342 150 - 400 K/uL   nRBC 0.0 0.0 - 0.2 %  Glucose, capillary     Status: Abnormal   Collection Time: 03/18/20  7:34  AM  Result Value Ref Range   Glucose-Capillary 139 (H) 70 - 99 mg/dL    DG Wrist 2 Views Right  Result Date: 03/17/2020 CLINICAL DATA:  Wrist fracture EXAM: RIGHT WRIST - 2 VIEW COMPARISON:  None. FINDINGS: Five low resolution intraoperative spot views of the right wrist. Total fluoroscopy time was 1 minutes 45 seconds. The images demonstrate surgical plate and multiple screw fixation of distal radius fracture with anatomic alignment. Ulnar styloid process fracture is also suspected. IMPRESSION: Intraoperative fluoroscopic assistance provided during surgical fixation of distal radius fracture. Electronically Signed   By: Donavan Foil M.D.   On: 03/17/2020 17:59   DG Humerus Right  Result Date: 03/17/2020 CLINICAL DATA:  Fracture EXAM: RIGHT HUMERUS - 2+ VIEW COMPARISON:  03/27/2019, CT 03/28/2019 FINDINGS: Three low resolution intraoperative spot views of the right proximal humerus. Total fluoroscopy time was 80.2 seconds. There is a comminuted proximal humerus fracture. Subsequent images demonstrate surgical plate and multiple screw fixation of  the fracture. IMPRESSION: Intraoperative fluoroscopic assistance provided during surgical fixation of proximal humerus fracture. Electronically Signed   By: Donavan Foil M.D.   On: 03/17/2020 15:38   DG C-Arm 1-60 Min-No Report  Result Date: 03/17/2020 Fluoroscopy was utilized by the requesting physician.  No radiographic interpretation.   DG C-Arm 1-60 Min-No Report  Result Date: 03/17/2020 Fluoroscopy was utilized by the requesting physician.  No radiographic interpretation.    Assessment/Plan: Active Problems:   Closed fracture of right proximal humerus   Closed fracture of right distal radius right carpal tunnel syndrome  1 Day Post-Op s/p Procedure(s): OPEN REDUCTION INTERNAL FIXATION (ORIF) PROXIMAL HUMERUS FRACTURE AND DISTAL RADIUS FRACTURE CARPAL TUNNEL RELEASE  Weightbearing: NWB RUE, sling for comfort Insicional and dressing care: splint in place, reinforce shoulder dressing PRN VTE prophylaxis: SCD's, early ambulation,  Pain control: discharge with norco 10/325 Follow - up plan: Follow-up in office in 2 weeks Dispo: patient ready for discharge home today   Contact information:   Weekdays [address], [place] [phone] A fter hours and holidays please check [email or web address] for group call information for [facility]  Ventura Bruns 03/18/2020, 8:00 AM

## 2020-03-18 NOTE — Discharge Summary (Signed)
Discharge Summary  Patient ID: Tim Cook MRN: [id number] DOB/AGE: [birth year] 59 y.o.  Admit date: 03/17/2020 Discharge date: 03/18/2020  Admission Diagnoses:  Right proximal humerus fracture, right distal radius fracture, right carpal tunnel syndrome   Discharge Diagnoses:  Active Problems:   Closed fracture of right proximal humerus   Closed fracture of right distal radius right carpal tunnel syndrome  Past Medical History:  Diagnosis Date  . Arthritis    "knees, lower back, hands" (05/18/2016)  . CAD (coronary artery disease), native coronary artery    05/18/16  Cath severe 90% LAD prox, 99% mid, Circ dominant with 30% OM1, 50% OM2, 30% OM3, 60% distal circ and PL, severe stenosis nondominant RCA.  EF 55%  3.0x 20, 2.5 x 20 mm, 2.25 x 16 mm Promus premier sent to LAD Dr. Clifton James   . Chronic bronchitis (HCC)    "most years" (05/18/2016)  . Chronic lower back pain   . DDD (degenerative disc disease), lumbar   . Hyperlipidemia   . Sleep apnea    "dx'd; never wore mask" (05/18/2016)  . Squamous carcinoma    cut off right upper arm, left shoulder; froze off nose and right side of face  . Type II diabetes mellitus (HCC)   . Unstable angina (HCC)   . Vomiting     Surgeries: Procedure(s): OPEN REDUCTION INTERNAL FIXATION (ORIF) PROXIMAL HUMERUS FRACTURE AND DISTAL RADIUS FRACTURE CARPAL TUNNEL RELEASE on 03/17/2020   Consultants (if any):   Discharged Condition: Improved  Hospital Course: Tim Cook is an 59 y.o. male who was admitted 03/17/2020 with a diagnosis of right proximal humerus fracture, right distal radius fracture, and right carpal tunnel syndrome and went to the operating room on 03/17/2020 and underwent the above named procedures.    He was given perioperative antibiotics:  Anti-infectives (From admission, onward)   Start     Dose/Rate Route Frequency Ordered Stop   03/17/20 2100  ceFAZolin (ANCEF) IVPB 2g/100 mL premix     Discontinue     2 g 200 mL/hr  over 30 Minutes Intravenous Every 6 hours 03/17/20 1656 03/18/20 1459   03/17/20 1505  ceFAZolin (ANCEF) 2-4 GM/100ML-% IVPB       Note to Pharmacy: Sharyn Creamer   : cabinet override      03/17/20 1505 03/18/20 0314   03/17/20 1000  ceFAZolin (ANCEF) IVPB 2g/100 mL premix        2 g 200 mL/hr over 30 Minutes Intravenous On call to O.R. 03/17/20 4098 03/17/20 1519    .  He was given sequential compression devices and early ambulation for DVT prophylaxis.  He benefited maximally from the hospital stay and there were no complications.    Recent vital signs:  Vitals:   03/18/20 0155 03/18/20 0610  BP: (!) 141/76 (!) 145/86  Pulse: (!) 101 92  Resp: 18 18  Temp: 97.9 F (36.6 C) 98.3 F (36.8 C)  SpO2: 97% 100%    Recent laboratory studies:  Lab Results  Component Value Date   HGB 12.2 (L) 03/18/2020   HGB 14.7 03/16/2020   HGB 13.8 05/19/2016   Lab Results  Component Value Date   WBC 11.4 (H) 03/18/2020   PLT 342 03/18/2020   No results found for: INR Lab Results  Component Value Date   NA 132 (L) 03/18/2020   K 4.2 03/18/2020   CL 98 03/18/2020   CO2 24 03/18/2020   BUN 24 (H) 03/18/2020   CREATININE 1.12 03/18/2020  GLUCOSE 135 (H) 03/18/2020    Discharge Medications:   Allergies as of 03/18/2020   No Known Allergies     Medication List    STOP taking these medications   colchicine 0.6 MG tablet   diclofenac sodium 1 % Gel Commonly known as: VOLTAREN   HYDROcodone-acetaminophen 5-325 MG tablet Commonly known as: NORCO/VICODIN Replaced by: HYDROcodone-acetaminophen 10-325 MG tablet   oxyCODONE-acetaminophen 5-325 MG tablet Commonly known as: PERCOCET/ROXICET   predniSONE 50 MG tablet Commonly known as: DELTASONE   sildenafil 25 MG tablet Commonly known as: Viagra   tiZANidine 4 MG tablet Commonly known as: Zanaflex   Vitamin D (Ergocalciferol) 1.25 MG (50000 UNIT) Caps capsule Commonly known as: DRISDOL     TAKE these medications    aspirin EC 81 MG tablet Take 81 mg by mouth daily.   atorvastatin 40 MG tablet Commonly known as: LIPITOR   baclofen 10 MG tablet Commonly known as: LIORESAL Take 1 tablet (10 mg total) by mouth 3 (three) times daily. As needed for muscle spasm   Farxiga 10 MG Tabs tablet Generic drug: dapagliflozin propanediol Take 10 mg by mouth daily.   fexofenadine 180 MG tablet Commonly known as: ALLEGRA Take 180 mg by mouth daily.   gabapentin 100 MG capsule Commonly known as: NEURONTIN Take 2 capsules (200 mg total) by mouth at bedtime. What changed:   when to take this  reasons to take this   HYDROcodone-acetaminophen 10-325 MG tablet Commonly known as: Norco Take 1 tablet by mouth every 6 (six) hours as needed. Replaces: HYDROcodone-acetaminophen 5-325 MG tablet   losartan 100 MG tablet Commonly known as: COZAAR 100 mg.   metFORMIN 500 MG tablet Commonly known as: GLUCOPHAGE Take 1,000 mg by mouth 2 (two) times daily with a meal.   multivitamin capsule Take 1 capsule by mouth daily.   nitroGLYCERIN 0.4 MG SL tablet Commonly known as: Nitrostat Place 1 tablet (0.4 mg total) under the tongue every 5 (five) minutes as needed for chest pain. Please give 90 day supply in the small bottles   ondansetron 4 MG tablet Commonly known as: Zofran Take 1 tablet (4 mg total) by mouth every 8 (eight) hours as needed for nausea or vomiting.   Rybelsus 7 MG Tabs Generic drug: Semaglutide Take 7 mg by mouth daily.   sennosides-docusate sodium 8.6-50 MG tablet Commonly known as: SENOKOT-S Take 2 tablets by mouth daily.       Diagnostic Studies: DG Wrist 2 Views Right  Result Date: 03/17/2020 CLINICAL DATA:  Wrist fracture EXAM: RIGHT WRIST - 2 VIEW COMPARISON:  None. FINDINGS: Five low resolution intraoperative spot views of the right wrist. Total fluoroscopy time was 1 minutes 45 seconds. The images demonstrate surgical plate and multiple screw fixation of distal radius  fracture with anatomic alignment. Ulnar styloid process fracture is also suspected. IMPRESSION: Intraoperative fluoroscopic assistance provided during surgical fixation of distal radius fracture. Electronically Signed   By: Donavan Foil M.D.   On: 03/17/2020 17:59   DG Humerus Right  Result Date: 03/17/2020 CLINICAL DATA:  Fracture EXAM: RIGHT HUMERUS - 2+ VIEW COMPARISON:  03/27/2019, CT 03/28/2019 FINDINGS: Three low resolution intraoperative spot views of the right proximal humerus. Total fluoroscopy time was 80.2 seconds. There is a comminuted proximal humerus fracture. Subsequent images demonstrate surgical plate and multiple screw fixation of the fracture. IMPRESSION: Intraoperative fluoroscopic assistance provided during surgical fixation of proximal humerus fracture. Electronically Signed   By: Donavan Foil M.D.   On: 03/17/2020 15:38  DG C-Arm 1-60 Min-No Report  Result Date: 03/17/2020 Fluoroscopy was utilized by the requesting physician.  No radiographic interpretation.   DG C-Arm 1-60 Min-No Report  Result Date: 03/17/2020 Fluoroscopy was utilized by the requesting physician.  No radiographic interpretation.    Disposition: Discharge disposition: 01-Home or Self Care          Follow-up Information    Teryl Lucy, MD. Schedule an appointment as soon as possible for a visit in 2 weeks.   Specialty: Orthopedic Surgery Contact information: [address] [place] [address] [phone]                Signed: Annita Brod 03/18/2020, 8:53 AM

## 2020-03-18 NOTE — TOC Transition Note (Signed)
Transition of Care [facility]) - CM/SW Discharge Note   Patient Details  Name: Tim Cook MRN: [id number] Date of Birth: Dec 25, 1960  Transition [facility]) CM/SW Contact:  Golda Acre, RN Phone Number: 03/18/2020, 9:51 AM   Clinical Narrative:    Pt dcd to home with self care no toc needs present.   Final next level of care: Home/Self Care Barriers to Discharge: No Barriers Identified   Patient Goals and CMS Choice Patient states their goals for this hospitalization and ongoing recovery are:: to go home and heal CMS Medicare.gov Compare Post Acute Care list provided to:: Patient    Discharge Placement                       Discharge Plan and Services                                     Social Determinants of Health (SDOH) Interventions     Readmission Risk Interventions No flowsheet data found.

## 2020-03-18 NOTE — Progress Notes (Signed)
Instructions were reviewed with patient. All questions were answered. Patient was transported to main entrance by wheelchair. ° °

## 2020-03-18 NOTE — Progress Notes (Signed)
Orthopedic Tech Progress Note Patient Details:  Tim Cook Feb 26, 1961 [id number]  Ortho Devices Type of Ortho Device: Sling immobilizer Ortho Device/Splint Location: Right replacement Ortho Device/Splint Interventions: Application   Post Interventions Patient Tolerated: Well Instructions Provided: Care of device   Saul Fordyce 03/18/2020, 10:17 AM

## 2020-03-18 NOTE — Evaluation (Signed)
Physical Therapy Evaluation Patient Details Name: Tim Cook MRN: [id number] DOB: 1960-10-11 Today's Date: 03/18/2020   History of Present Illness  Pt is a 59 y.o. male who had a right proximal humerus fracture about a year ago, and then had a fall at work just recently and had a proximal humerus fracture, with displacement, distal radius fracture, and he had some pre-existing carpal tunnel syndrome.  Pt is s/p ORIF R humerus, ORIF distal radius fx, and R carpal tunnel release on 03/17/20. PMH of arthritis, CAD, DDD, DM2  Clinical Impression  Pt admitted with above diagnosis.  He was able to demonstrate safe gait and transfers with maintenance of shoulder immobilization and NWB status.  Pt with good understanding of ADLs and sling use , as he has had shoulder fx in the past.  Pt with good support and necessary DME at home.  No further acute PT indicated.     Follow Up Recommendations Other (comment) (f/u outpt PT once ordered by surgeon)    Equipment Recommendations  None recommended by PT    Recommendations for Other Services       Precautions / Restrictions Precautions Precautions: Fall Required Braces or Orthoses: Sling Restrictions Weight Bearing Restrictions: Yes RUE Weight Bearing: Non weight bearing      Mobility  Bed Mobility Overal bed mobility: Needs Assistance Bed Mobility: Supine to Sit     Supine to sit: Supervision;HOB elevated     General bed mobility comments: reports plans to sleep in recliner  Transfers Overall transfer level: Needs assistance Equipment used: None Transfers: Sit to/from Stand Sit to Stand: Supervision         General transfer comment: Demonstrated safely without use of R UE  Ambulation/Gait Ambulation/Gait assistance: Supervision Gait Distance (Feet): 300 Feet Assistive device: None Gait Pattern/deviations: Step-through pattern Gait velocity: decreased   General Gait Details: steady; no LOB  Stairs             Wheelchair Mobility    Modified Rankin (Stroke Patients Only)       Balance Overall balance assessment: Needs assistance   Sitting balance-Leahy Scale: Normal     Standing balance support: During functional activity;No upper extremity supported Standing balance-Leahy Scale: Good                               Pertinent Vitals/Pain Pain Assessment: 0-10 Pain Score: 9  Pain Location: Entire R arm Pain Descriptors / Indicators: Throbbing;Burning Pain Intervention(s): Limited activity within patient's tolerance;Monitored during session    Home Living Family/patient expects to be discharged to:: Private residence Living Arrangements: Spouse/significant other Available Help at Discharge: Family;Available 24 hours/day (wife works from home) Type of Home: House Home Access: Stairs to enter   Secretary/administrator of Steps: 1-threshold Home Layout: One level Home Equipment: Hand held shower head;Shower seat;Grab bars - tub/shower;Cane - single point;Walker - 2 wheels      Prior Function Level of Independence: Independent         Comments: Pt independent with IADL, ADLs, and community ambulation.  Pt working.  Has had 2 falls at work (off ladder and off running board)     Hand Dominance        Extremity/Trunk Assessment   Upper Extremity Assessment Upper Extremity Assessment: RUE deficits/detail RUE Deficits / Details: Able to partially flex/ext fingers - limited by splint.  R wrist in splint and arm in sling.    Lower Extremity Assessment  Lower Extremity Assessment: Overall WFL for tasks assessed    Cervical / Trunk Assessment Cervical / Trunk Assessment: Normal  Communication   Communication: No difficulties  Cognition Arousal/Alertness: Awake/alert Behavior During Therapy: WFL for tasks assessed/performed Overall Cognitive Status: Within Functional Limits for tasks assessed                                        General  Comments General comments (skin integrity, edema, etc.): Pt educated on wearing sling - reports MD said to wear when up and moving.  Discussed moving fingers to help with swelling, no further exercise at this time due to orders for immobilization.  Assisted pt with getting dressed.  He has had shoulder fx before and was aware of using button shirts and sequencing.  Educated on don/doffing sling.    Exercises     Assessment/Plan    PT Assessment Patent does not need any further PT services  PT Problem List         PT Treatment Interventions      PT Goals (Current goals can be found in the Care Plan section)  Acute Rehab PT Goals Patient Stated Goal: return home PT Goal Formulation: All assessment and education complete, DC therapy Time For Goal Achievement: 03/18/20 Potential to Achieve Goals: Good    Frequency     Barriers to discharge        Co-evaluation               AM-PAC PT "6 Clicks" Mobility  Outcome Measure Help needed turning from your back to your side while in a flat bed without using bedrails?: None Help needed moving from lying on your back to sitting on the side of a flat bed without using bedrails?: None Help needed moving to and from a bed to a chair (including a wheelchair)?: None Help needed standing up from a chair using your arms (e.g., wheelchair or bedside chair)?: None Help needed to walk in hospital room?: None Help needed climbing 3-5 steps with a railing? : None 6 Click Score: 24    End of Session Equipment Utilized During Treatment: Gait belt;Other (comment) (shoulder sling) Activity Tolerance: Patient tolerated treatment well Patient left: in chair;with family/visitor present Nurse Communication: Mobility status      Time: 3790-2409 PT Time Calculation (min) (ACUTE ONLY): 30 min   Charges:   PT Evaluation $PT Eval Moderate Complexity: 1 Melina Schools, PT Acute Rehab Services Pager [phone] [facility]  [place] 03/18/2020, 10:49 AM

## 2020-03-19 ENCOUNTER — Telehealth: Payer: Self-pay | Admitting: Cardiology

## 2020-03-19 NOTE — Telephone Encounter (Signed)
Called patient informed him that he could be switched to a virtual appointment. He recently injured his arm and it  is hard for him to drive right now.

## 2020-03-19 NOTE — Telephone Encounter (Signed)
Patient wants to know if his 04/03/20 appt with Dr. Bing Matter can be virtual. Please advise.

## 2020-03-25 ENCOUNTER — Other Ambulatory Visit: Payer: Self-pay | Admitting: Family Medicine

## 2020-03-28 ENCOUNTER — Other Ambulatory Visit: Payer: Self-pay | Admitting: Family Medicine

## 2020-03-31 NOTE — Telephone Encounter (Signed)
Sent patient MyChart message to see if he was still using medication 

## 2020-04-03 ENCOUNTER — Telehealth (INDEPENDENT_AMBULATORY_CARE_PROVIDER_SITE_OTHER): Payer: BC Managed Care – PPO | Admitting: Cardiology

## 2020-04-03 VITALS — BP 116/77 | HR 97 | Ht 68.0 in | Wt 234.0 lb

## 2020-04-03 DIAGNOSIS — I25119 Atherosclerotic heart disease of native coronary artery with unspecified angina pectoris: Secondary | ICD-10-CM | POA: Diagnosis not present

## 2020-04-03 DIAGNOSIS — E7800 Pure hypercholesterolemia, unspecified: Secondary | ICD-10-CM | POA: Diagnosis not present

## 2020-04-03 DIAGNOSIS — E669 Obesity, unspecified: Secondary | ICD-10-CM

## 2020-04-03 NOTE — Progress Notes (Addendum)
Cardiology virtual office Note:    Date:  04/03/2020   ID:  Tim Cook, DOB 1961/02/20, MRN [id number]  PCP:  Wilfrid Lund, PA  Cardiologist:  Gypsy Balsam, MD    Referring MD: Wilfrid Lund, [place]   Chief Complaint  Patient presents with  . Follow-up  Doing well except I have a fracture of my humerus and my wrist  History of Present Illness:    Tim Cook is a 59 y.o. male with past medical history significant for coronary artery disease, in 2017 he had 90% proximal LAD and 90% mid LAD stenosis requiring stenting.  His past medical history is also significant for diabetes, hypertension, obesity dyslipidemia, sleep apnea.  He does have a televisit with me today and the reason for that is the fact that he is at home after he sustained fracture of his wrist and humerus.  He does have a cast and he preferred to have a video visit.  Recently she had had a surgery.  Surgery was uncomplicated, he did not have any cardiac complication of surgery.  Denies have any chest pain tightness squeezing pressure burning chest.  It looks like he is living fairly sedentary lifestyle because of recent fracture of different bones.  Past Medical History:  Diagnosis Date  . Arthritis    "knees, lower back, hands" (05/18/2016)  . CAD (coronary artery disease), native coronary artery    05/18/16  Cath severe 90% LAD prox, 99% mid, Circ dominant with 30% OM1, 50% OM2, 30% OM3, 60% distal circ and PL, severe stenosis nondominant RCA.  EF 55%  3.0x 20, 2.5 x 20 mm, 2.25 x 16 mm Promus premier sent to LAD Dr. Clifton James   . Chronic bronchitis (HCC)    "most years" (05/18/2016)  . Chronic lower back pain   . DDD (degenerative disc disease), lumbar   . Hyperlipidemia   . Sleep apnea    "dx'd; never wore mask" (05/18/2016)  . Squamous carcinoma    cut off right upper arm, left shoulder; froze off nose and right side of face  . Type II diabetes mellitus (HCC)   . Unstable angina (HCC)   . Vomiting      Past Surgical History:  Procedure Laterality Date  . CARDIAC CATHETERIZATION N/A 05/18/2016   Procedure: Left Heart Cath and Coronary Angiography;  Surgeon: Kathleene Hazel, MD;  Location: [facility] INVASIVE CV LAB;  Service: Cardiovascular;  Laterality: N/A;  . CARDIAC CATHETERIZATION N/A 05/18/2016   Procedure: Coronary Stent Intervention;  Surgeon: Kathleene Hazel, MD;  Location: [facility] INVASIVE CV LAB;  Service: Cardiovascular;  Laterality: N/A;  . CARPAL TUNNEL RELEASE Right 03/17/2020   Procedure: CARPAL TUNNEL RELEASE;  Surgeon: Teryl Lucy, MD;  Location: [facility] ORS;  Service: Orthopedics;  Laterality: Right;  . CORONARY ANGIOPLASTY    . FOOT FRACTURE SURGERY Right 1970s  . LAPAROSCOPIC GASTRIC BANDING  07/2007   by Dr. Daphine Deutscher  . ORIF HUMERUS FRACTURE Right 03/17/2020   Procedure: OPEN REDUCTION INTERNAL FIXATION (ORIF) PROXIMAL HUMERUS FRACTURE AND DISTAL RADIUS FRACTURE;  Surgeon: Teryl Lucy, MD;  Location: [facility] ORS;  Service: Orthopedics;  Laterality: Right;  . PILONIDAL CYST EXCISION  ~ 1985   "took 8 out all at once"  . SQUAMOUS CELL CARCINOMA EXCISION     right upper arm, left shoulder    Current Medications: Current Meds  Medication Sig  . aspirin EC 81 MG tablet Take 81 mg by mouth daily.  Marland Kitchen atorvastatin (LIPITOR) 40 MG  tablet   . baclofen (LIORESAL) 10 MG tablet Take 1 tablet (10 mg total) by mouth 3 (three) times daily. As needed for muscle spasm  . FARXIGA 10 MG TABS tablet Take 10 mg by mouth daily.   . fexofenadine (ALLEGRA) 180 MG tablet Take 180 mg by mouth daily.  Marland Kitchen gabapentin (NEURONTIN) 100 MG capsule Take 2 capsules (200 mg total) by mouth at bedtime. (Patient taking differently: Take 200 mg by mouth at bedtime as needed (pain). )  . HYDROcodone-acetaminophen (NORCO) 10-325 MG tablet Take 1 tablet by mouth every 6 (six) hours as needed.  Marland Kitchen losartan (COZAAR) 100 MG tablet 100 mg.   . metFORMIN (GLUCOPHAGE) 500 MG tablet Take 1,000 mg by mouth 2 (two)  times daily with a meal.   . Multiple Vitamin (MULTIVITAMIN) capsule Take 1 capsule by mouth daily.  . nitroGLYCERIN (NITROSTAT) 0.4 MG SL tablet Place 1 tablet (0.4 mg total) under the tongue every 5 (five) minutes as needed for chest pain. Please give 90 day supply in the small bottles  . ondansetron (ZOFRAN) 4 MG tablet Take 1 tablet (4 mg total) by mouth every 8 (eight) hours as needed for nausea or vomiting.  . RYBELSUS 7 MG TABS Take 7 mg by mouth daily.  . sennosides-docusate sodium (SENOKOT-S) 8.6-50 MG tablet Take 2 tablets by mouth daily.  . Vitamin D, Ergocalciferol, (DRISDOL) 1.25 MG (50000 UNIT) CAPS capsule TAKE 1 CAPSULE EVERY 7 DAYS     Allergies:   Patient has no known allergies.   Social History   Socioeconomic History  . Marital status: Married    Spouse name: Not on file  . Number of children: Not on file  . Years of education: Not on file  . Highest education level: Not on file  Occupational History  . Not on file  Tobacco Use  . Smoking status: Former Smoker    Packs/day: 0.50    Years: 12.00    Pack years: 6.00    Types: Cigarettes    Quit date: 12/15/1991    Years since quitting: 28.3  . Smokeless tobacco: Former Neurosurgeon    Types: Snuff  . Tobacco comment: "quit snuff in ~ 1981"  Vaping Use  . Vaping Use: Never used  Substance and Sexual Activity  . Alcohol use: Yes    Alcohol/week: 7.0 standard drinks    Types: 7 Cans of beer per week    Comment: More social use  . Drug use: No  . Sexual activity: Yes  Other Topics Concern  . Not on file  Social History Narrative  . Not on file   Social Determinants of Health   Financial Resource Strain:   . Difficulty of Paying Living Expenses:   Food Insecurity:   . Worried About Programme researcher, broadcasting/film/video in the Last Year:   . Barista in the Last Year:   Transportation Needs:   . Freight forwarder (Medical):   Marland Kitchen Lack of Transportation (Non-Medical):   Physical Activity:   . Days of Exercise per  Week:   . Minutes of Exercise per Session:   Stress:   . Feeling of Stress :   Social Connections:   . Frequency of Communication with Friends and Family:   . Frequency of Social Gatherings with Friends and Family:   . Attends Religious Services:   . Active Member of Clubs or Organizations:   . Attends Banker Meetings:   Marland Kitchen Marital Status:  Family History: The patient's family history includes Diabetes Mellitus I in his sister; Heart attack in his father; Heart disease in his father, mother, and sister; Stroke in his brother and father. ROS:   Please see the history of present illness.    All 14 point review of systems negative except as described per history of present illness  EKGs/Labs/Other Studies Reviewed:      Recent Labs: 03/18/2020: BUN 24; Creatinine, Ser 1.12; Hemoglobin 12.2; Platelets 342; Potassium 4.2; Sodium 132  Recent Lipid Panel    Component Value Date/Time   CHOL 125 12/07/2018 1520   TRIG 103 12/07/2018 1520   HDL 57 12/07/2018 1520   CHOLHDL 2.2 12/07/2018 1520   LDLCALC 47 12/07/2018 1520    Physical Exam:    VS:  BP 116/77   Pulse 97   Ht 5\' 8"  (1.727 m)   Wt 234 lb (106.1 kg)   BMI 35.58 kg/m     Wt Readings from Last 3 Encounters:  04/03/20 234 lb (106.1 kg)  03/17/20 240 lb 7 oz (109.1 kg)  03/16/20 240 lb 7 oz (109.1 kg)     That was a video visit.  Patient was alert awake oriented x3 not in any distress.  ASSESSMENT:    1. Coronary artery disease involving native coronary artery with angina pectoris, unspecified whether native or transplanted heart (HCC)   2. Pure hypercholesterolemia   3. Obesity (BMI 30-39.9)    PLAN:    In order of problems listed above:  1. Coronary disease seems to be stable from that point review on appropriate medication which I will continue. 2. Dyslipidemia: I did review his K PN I do see laboratory test from February which showing LDL still not being well controlled his LDL was 74  HDL 51.  With his risk profile I prefer to have his LDL less than 70.  He is scheduled to see his primary care physician this coming month I asked him to make sure that you have fasting lipid profile done. 3. Obesity: Obviously a problem and will talk about this more in the future he will be to start being more active and exercise more.  Obviously now after recent surgery on his arm he is not too active. 4. Diabetes: His hemoglobin A1c checked last time in July 21 showed 6.9 this is from K PN.  Well-controlled continue present management.  Patient was at his home, I was in our office in [place].  Total time spent with the patient 20 minutes I was about 5 minutes of time to prepare for this visit  Medication Adjustments/Labs and Tests Ordered: Current medicines are reviewed at length with the patient today.  Concerns regarding medicines are outlined above.  No orders of the defined types were placed in this encounter.  Medication changes: No orders of the defined types were placed in this encounter.   Signed, [place], MD, [facility] 04/03/2020 1:57 PM    [facility]

## 2020-04-03 NOTE — Patient Instructions (Signed)

## 2020-04-17 ENCOUNTER — Ambulatory Visit: Payer: BC Managed Care – PPO | Admitting: Family Medicine

## 2020-05-01 DIAGNOSIS — E782 Mixed hyperlipidemia: Secondary | ICD-10-CM | POA: Diagnosis not present

## 2020-05-01 DIAGNOSIS — Z0000 Encounter for general adult medical examination without abnormal findings: Secondary | ICD-10-CM | POA: Diagnosis not present

## 2020-05-01 DIAGNOSIS — E1169 Type 2 diabetes mellitus with other specified complication: Secondary | ICD-10-CM | POA: Diagnosis not present

## 2020-05-01 DIAGNOSIS — E669 Obesity, unspecified: Secondary | ICD-10-CM | POA: Diagnosis not present

## 2020-05-01 DIAGNOSIS — I2510 Atherosclerotic heart disease of native coronary artery without angina pectoris: Secondary | ICD-10-CM | POA: Diagnosis not present

## 2020-05-01 DIAGNOSIS — I10 Essential (primary) hypertension: Secondary | ICD-10-CM | POA: Diagnosis not present

## 2020-05-01 DIAGNOSIS — Z125 Encounter for screening for malignant neoplasm of prostate: Secondary | ICD-10-CM | POA: Diagnosis not present

## 2020-05-01 DIAGNOSIS — E559 Vitamin D deficiency, unspecified: Secondary | ICD-10-CM | POA: Diagnosis not present

## 2020-05-01 DIAGNOSIS — Z1159 Encounter for screening for other viral diseases: Secondary | ICD-10-CM | POA: Diagnosis not present

## 2020-05-03 ENCOUNTER — Other Ambulatory Visit: Payer: Self-pay | Admitting: Cardiology

## 2020-05-18 DIAGNOSIS — L820 Inflamed seborrheic keratosis: Secondary | ICD-10-CM | POA: Diagnosis not present

## 2020-05-18 DIAGNOSIS — L570 Actinic keratosis: Secondary | ICD-10-CM | POA: Diagnosis not present

## 2020-05-18 DIAGNOSIS — L821 Other seborrheic keratosis: Secondary | ICD-10-CM | POA: Diagnosis not present

## 2020-05-18 DIAGNOSIS — D225 Melanocytic nevi of trunk: Secondary | ICD-10-CM | POA: Diagnosis not present

## 2020-05-18 DIAGNOSIS — Z85828 Personal history of other malignant neoplasm of skin: Secondary | ICD-10-CM | POA: Diagnosis not present

## 2020-07-03 DIAGNOSIS — Z23 Encounter for immunization: Secondary | ICD-10-CM | POA: Diagnosis not present

## 2020-07-15 DIAGNOSIS — E119 Type 2 diabetes mellitus without complications: Secondary | ICD-10-CM | POA: Insufficient documentation

## 2020-07-15 DIAGNOSIS — G4730 Sleep apnea, unspecified: Secondary | ICD-10-CM | POA: Insufficient documentation

## 2020-07-15 DIAGNOSIS — M5136 Other intervertebral disc degeneration, lumbar region: Secondary | ICD-10-CM | POA: Insufficient documentation

## 2020-07-15 DIAGNOSIS — J42 Unspecified chronic bronchitis: Secondary | ICD-10-CM | POA: Insufficient documentation

## 2020-07-15 DIAGNOSIS — M1990 Unspecified osteoarthritis, unspecified site: Secondary | ICD-10-CM | POA: Insufficient documentation

## 2020-07-15 DIAGNOSIS — M545 Low back pain, unspecified: Secondary | ICD-10-CM | POA: Insufficient documentation

## 2020-07-15 DIAGNOSIS — G8929 Other chronic pain: Secondary | ICD-10-CM | POA: Insufficient documentation

## 2020-07-15 DIAGNOSIS — R1110 Vomiting, unspecified: Secondary | ICD-10-CM | POA: Insufficient documentation

## 2020-07-20 ENCOUNTER — Ambulatory Visit (INDEPENDENT_AMBULATORY_CARE_PROVIDER_SITE_OTHER): Payer: BC Managed Care – PPO | Admitting: Cardiology

## 2020-07-20 ENCOUNTER — Encounter: Payer: Self-pay | Admitting: Cardiology

## 2020-07-20 ENCOUNTER — Other Ambulatory Visit: Payer: Self-pay

## 2020-07-20 VITALS — BP 112/70 | HR 96 | Ht 68.0 in | Wt 254.0 lb

## 2020-07-20 DIAGNOSIS — E669 Obesity, unspecified: Secondary | ICD-10-CM | POA: Diagnosis not present

## 2020-07-20 DIAGNOSIS — E7800 Pure hypercholesterolemia, unspecified: Secondary | ICD-10-CM | POA: Diagnosis not present

## 2020-07-20 DIAGNOSIS — I119 Hypertensive heart disease without heart failure: Secondary | ICD-10-CM

## 2020-07-20 DIAGNOSIS — I25119 Atherosclerotic heart disease of native coronary artery with unspecified angina pectoris: Secondary | ICD-10-CM

## 2020-07-20 NOTE — Patient Instructions (Signed)
Medication Instructions:  Your physician recommends that you continue on your current medications as directed. Please refer to the Current Medication list given to you today.  *If you need a refill on your cardiac medications before your next appointment, please call your pharmacy*   Lab Work: None, If you have labs (blood work) drawn today and your tests are completely normal, you will receive your results only by: Marland Kitchen MyChart Message (if you have MyChart) OR . A paper copy in the mail If you have any lab test that is abnormal or we need to change your treatment, we will call you to review the results.   Testing/Procedures: Your physician has requested that you have an echocardiogram. Echocardiography is a painless test that uses sound waves to create images of your heart. It provides your doctor with information about the size and shape of your heart and how well your heart's chambers and valves are working. This procedure takes approximately one hour. There are no restrictions for this procedure.     Follow-Up: At [facility], you and your health needs are our priority.  As part of our continuing mission to provide you with exceptional heart care, we have created designated Provider Care Teams.  These Care Teams include your primary Cardiologist (physician) and Advanced Practice Providers (APPs -  Physician Assistants and Nurse Practitioners) who all work together to provide you with the care you need, when you need it.  We recommend signing up for the patient portal called "MyChart".  Sign up information is provided on this After Visit Summary.  MyChart is used to connect with patients for Virtual Visits (Telemedicine).  Patients are able to view lab/test results, encounter notes, upcoming appointments, etc.  Non-urgent messages can be sent to your provider as well.   To learn more about what you can do with MyChart, go to [email or web address].    Your next appointment:   6  month(s)  The format for your next appointment:   In Person  Provider:   Gypsy Balsam, MD   Other Instructions

## 2020-07-20 NOTE — Addendum Note (Signed)
Addended by: Hazle Quant on: 07/20/2020 08:46 AM   Modules accepted: Orders

## 2020-07-20 NOTE — Progress Notes (Signed)
Cardiology Office Note:    Date:  07/20/2020   ID:  Tim Cook, DOB 1960-10-04, MRN [id number]  PCP:  Wilfrid Lund, PA  Cardiologist:  Gypsy Balsam, MD    Referring MD: Wilfrid Lund, PA   No chief complaint on file. Doing well  History of Present Illness:    Tim Cook is a 59 y.o. male with past medical history significant for coronary artery disease in 2017 he got 90% proximal LAD and 90% mid LAD at that required stenting.  His past medical history is also significant for hypertension, obesity, sleep apnea, dyslipidemia, diabetes.  Comes today 2 months for follow-up.  He is doing well.  Denies have any chest pain tightness squeezing pressure burning chest.  Overall however he lives a very sedentary lifestyle.  His wife is with him in the office.  Few days ago they went to note: Is doing walk a lot he did quite okay just simply was tired and exhausted.  Past Medical History:  Diagnosis Date  . Acute lateral meniscal tear 06/09/2016  . Arthritis    "knees, lower back, hands" (05/18/2016)  . Bariatric surgery status 05/18/2016  . Bursitis of right shoulder 01/21/2019  . CAD (coronary artery disease), native coronary artery    05/18/16  Cath severe 90% LAD prox, 99% mid, Circ dominant with 30% OM1, 50% OM2, 30% OM3, 60% distal circ and PL, severe stenosis nondominant RCA.  EF 55%  3.0x 20, 2.5 x 20 mm, 2.25 x 16 mm Promus premier sent to LAD Dr. Clifton James   . Chronic bronchitis (HCC)    "most years" (05/18/2016)  . Chronic lower back pain   . Closed fracture of right distal radius 03/12/2020  . Closed fracture of right proximal humerus 03/12/2020  . Controlled type 2 diabetes with retinopathy (HCC)   . DDD (degenerative disc disease), lumbar   . Degenerative arthritis of right knee 05/09/2017  . Degenerative disc disease, lumbar 07/07/2016  . Dislocation of left middle finger 01/21/2019  . Hyperlipidemia   . Hypertensive heart disease without CHF   . Nonallopathic lesion  of lumbosacral region 07/07/2016  . Nonallopathic lesion of sacral region 07/07/2016  . Nonallopathic lesion of thoracic region 07/07/2016  . Sleep apnea    "dx'd; never wore mask" (05/18/2016)  . Squamous carcinoma    cut off right upper arm, left shoulder; froze off nose and right side of face  . Type II diabetes mellitus (HCC)   . Unstable angina (HCC)   . Vomiting     Past Surgical History:  Procedure Laterality Date  . CARDIAC CATHETERIZATION N/A 05/18/2016   Procedure: Left Heart Cath and Coronary Angiography;  Surgeon: Kathleene Hazel, MD;  Location: [facility] INVASIVE CV LAB;  Service: Cardiovascular;  Laterality: N/A;  . CARDIAC CATHETERIZATION N/A 05/18/2016   Procedure: Coronary Stent Intervention;  Surgeon: Kathleene Hazel, MD;  Location: [facility] INVASIVE CV LAB;  Service: Cardiovascular;  Laterality: N/A;  . CARPAL TUNNEL RELEASE Right 03/17/2020   Procedure: CARPAL TUNNEL RELEASE;  Surgeon: Teryl Lucy, MD;  Location: [facility] ORS;  Service: Orthopedics;  Laterality: Right;  . CORONARY ANGIOPLASTY    . FOOT FRACTURE SURGERY Right 1970s  . LAPAROSCOPIC GASTRIC BANDING  07/2007   by Dr. Daphine Deutscher  . ORIF HUMERUS FRACTURE Right 03/17/2020   Procedure: OPEN REDUCTION INTERNAL FIXATION (ORIF) PROXIMAL HUMERUS FRACTURE AND DISTAL RADIUS FRACTURE;  Surgeon: Teryl Lucy, MD;  Location: [facility] ORS;  Service: Orthopedics;  Laterality: Right;  .  PILONIDAL CYST EXCISION  ~ 1985   "took 8 out all at once"  . SQUAMOUS CELL CARCINOMA EXCISION     right upper arm, left shoulder    Current Medications: Current Meds  Medication Sig  . aspirin EC 81 MG tablet Take 81 mg by mouth daily.  Marland Kitchen atorvastatin (LIPITOR) 40 MG tablet   . FARXIGA 10 MG TABS tablet Take 10 mg by mouth daily.   Marland Kitchen gabapentin (NEURONTIN) 100 MG capsule Take 2 capsules (200 mg total) by mouth at bedtime. (Patient taking differently: Take 200 mg by mouth at bedtime as needed (pain). )  . losartan (COZAAR) 100 MG tablet 100  mg.   . metFORMIN (GLUCOPHAGE) 500 MG tablet Take 1,000 mg by mouth 2 (two) times daily with a meal.   . Multiple Vitamin (MULTIVITAMIN) capsule Take 1 capsule by mouth daily.  . nitroGLYCERIN (NITROSTAT) 0.4 MG SL tablet PLACE 1 TABLET (0.4 MG TOTAL) UNDER THE TONGUE EVERY 5 (FIVE) MINUTES AS NEEDED FOR CHEST PAIN.  . RYBELSUS 7 MG TABS Take 7 mg by mouth daily.  . Vitamin D, Ergocalciferol, (DRISDOL) 1.25 MG (50000 UNIT) CAPS capsule TAKE 1 CAPSULE EVERY 7 DAYS     Allergies:   Patient has no known allergies.   Social History   Socioeconomic History  . Marital status: Married    Spouse name: Not on file  . Number of children: Not on file  . Years of education: Not on file  . Highest education level: Not on file  Occupational History  . Not on file  Tobacco Use  . Smoking status: Former Smoker    Packs/day: 0.50    Years: 12.00    Pack years: 6.00    Types: Cigarettes    Quit date: 12/15/1991    Years since quitting: 28.6  . Smokeless tobacco: Former Neurosurgeon    Types: Snuff  . Tobacco comment: "quit snuff in ~ 1981"  Vaping Use  . Vaping Use: Never used  Substance and Sexual Activity  . Alcohol use: Yes    Alcohol/week: 7.0 standard drinks    Types: 7 Cans of beer per week    Comment: More social use  . Drug use: No  . Sexual activity: Yes  Other Topics Concern  . Not on file  Social History Narrative  . Not on file   Social Determinants of Health   Financial Resource Strain:   . Difficulty of Paying Living Expenses: Not on file  Food Insecurity:   . Worried About Programme researcher, broadcasting/film/video in the Last Year: Not on file  . Ran Out of Food in the Last Year: Not on file  Transportation Needs:   . Lack of Transportation (Medical): Not on file  . Lack of Transportation (Non-Medical): Not on file  Physical Activity:   . Days of Exercise per Week: Not on file  . Minutes of Exercise per Session: Not on file  Stress:   . Feeling of Stress : Not on file  Social Connections:     . Frequency of Communication with Friends and Family: Not on file  . Frequency of Social Gatherings with Friends and Family: Not on file  . Attends Religious Services: Not on file  . Active Member of Clubs or Organizations: Not on file  . Attends Banker Meetings: Not on file  . Marital Status: Not on file     Family History: The patient's family history includes Diabetes Mellitus I in his sister; Heart  attack in his father; Heart disease in his father, mother, and sister; Stroke in his brother and father. ROS:   Please see the history of present illness.    All 14 point review of systems negative except as described per history of present illness  EKGs/Labs/Other Studies Reviewed:      Recent Labs: 03/18/2020: BUN 24; Creatinine, Ser 1.12; Hemoglobin 12.2; Platelets 342; Potassium 4.2; Sodium 132  Recent Lipid Panel    Component Value Date/Time   CHOL 125 12/07/2018 1520   TRIG 103 12/07/2018 1520   HDL 57 12/07/2018 1520   CHOLHDL 2.2 12/07/2018 1520   LDLCALC 47 12/07/2018 1520    Physical Exam:    VS:  BP 112/70   Pulse 96   Ht 5\' 8"  (1.727 m)   Wt 254 lb (115.2 kg)   SpO2 95%   BMI 38.62 kg/m     Wt Readings from Last 3 Encounters:  07/20/20 254 lb (115.2 kg)  04/03/20 234 lb (106.1 kg)  03/17/20 240 lb 7 oz (109.1 kg)     GEN:  Well nourished, well developed in no acute distress HEENT: Normal NECK: No JVD; No carotid bruits LYMPHATICS: No lymphadenopathy CARDIAC: RRR, no murmurs, no rubs, no gallops RESPIRATORY:  Clear to auscultation without rales, wheezing or rhonchi  ABDOMEN: Soft, non-tender, non-distended MUSCULOSKELETAL:  No edema; No deformity  SKIN: Warm and dry LOWER EXTREMITIES: no swelling NEUROLOGIC:  Alert and oriented x 3 PSYCHIATRIC:  Normal affect   ASSESSMENT:    1. Coronary artery disease involving native coronary artery with angina pectoris, unspecified whether native or transplanted heart (HCC)   2. Hypertensive  heart disease without CHF   3. Obesity (BMI 30-39.9)   4. Pure hypercholesterolemia    PLAN:    In order of problems listed above:  1. Coronary disease stable from that point review denies have any chest pain tightness squeezing pressure burning chest.  I will ask him to have echocardiogram to recheck left ventricle ejection fraction I do this mostly for the fact that he became short of breath quite significantly while walking after his do I suspect the reason is simply deconditioning. 2. Essential hypertension blood pressure well controlled continue present management. 3. Obesity we talked in length about measures that he can take to help with his obesity he is trying to be a little more active and I strongly encouraged him to exercise at least 30 minutes 5 times a week. 4. Dyslipidemia I did get data from his primary care physician from the summer his LDL was 59 HDL 64 this is on high intense statin which I will continue. 5. I will ask you to get the EKG for baseline.  Echocardiogram will be done because of dyspnea on exertion which I suspect is mostly secondary to deconditioning but I will make sure he does not have any significant cardiomyopathy.  I see him back in 6 months   Medication Adjustments/Labs and Tests Ordered: Current medicines are reviewed at length with the patient today.  Concerns regarding medicines are outlined above.  No orders of the defined types were placed in this encounter.  Medication changes: No orders of the defined types were placed in this encounter.   Signed, 03/19/20, MD, [facility] 07/20/2020 8:33 AM    [facility]

## 2020-08-05 ENCOUNTER — Other Ambulatory Visit: Payer: Self-pay

## 2020-08-05 ENCOUNTER — Ambulatory Visit (HOSPITAL_BASED_OUTPATIENT_CLINIC_OR_DEPARTMENT_OTHER)
Admission: RE | Admit: 2020-08-05 | Discharge: 2020-08-05 | Disposition: A | Discharge status: Home / Self Care | Payer: BC Managed Care – PPO | Source: Ambulatory Visit | Attending: Cardiology | Admitting: Cardiology

## 2020-08-05 DIAGNOSIS — E669 Obesity, unspecified: Secondary | ICD-10-CM | POA: Insufficient documentation

## 2020-08-05 DIAGNOSIS — I25119 Atherosclerotic heart disease of native coronary artery with unspecified angina pectoris: Secondary | ICD-10-CM | POA: Diagnosis not present

## 2020-08-05 DIAGNOSIS — I119 Hypertensive heart disease without heart failure: Secondary | ICD-10-CM | POA: Insufficient documentation

## 2020-08-05 DIAGNOSIS — E7800 Pure hypercholesterolemia, unspecified: Secondary | ICD-10-CM | POA: Insufficient documentation

## 2020-08-05 LAB — ECHOCARDIOGRAM COMPLETE
AR max vel: 1.23 cm2
AV Area VTI: 1.32 cm2
AV Area mean vel: 1.12 cm2
AV Mean grad: 11 mmHg
AV Peak grad: 17.3 mmHg
Ao pk vel: 2.08 m/s
Area-P 1/2: 1.98 cm2
S' Lateral: 2.92 cm

## 2020-09-29 IMAGING — RF DG WRIST 2V*R*
1 series · 5 of 5 positions shown · non-contrast
Comparison: None.

CLINICAL DATA: Wrist fracture

EXAM:
RIGHT WRIST - 2 VIEW

[Series 1: run · 5 of 5 slices shown]
[im 1/5]
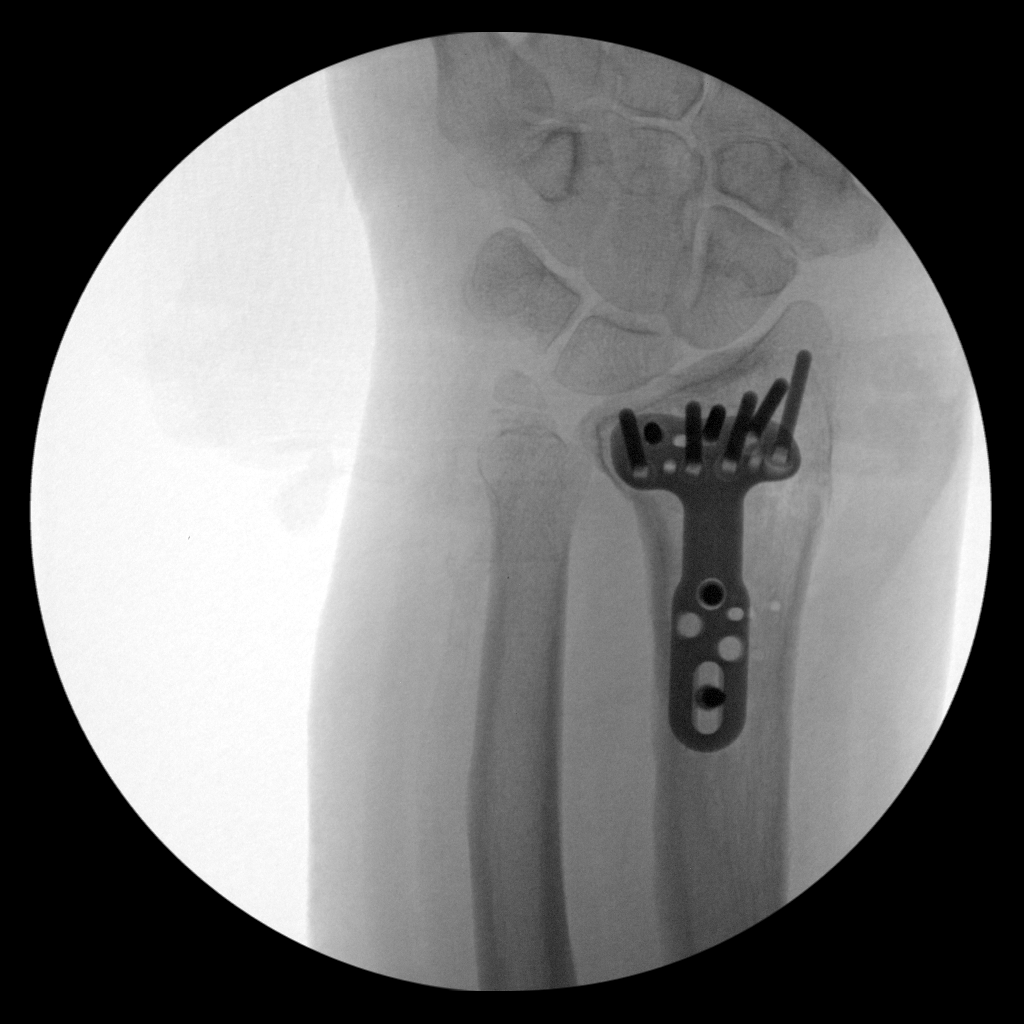
[im 2/5]
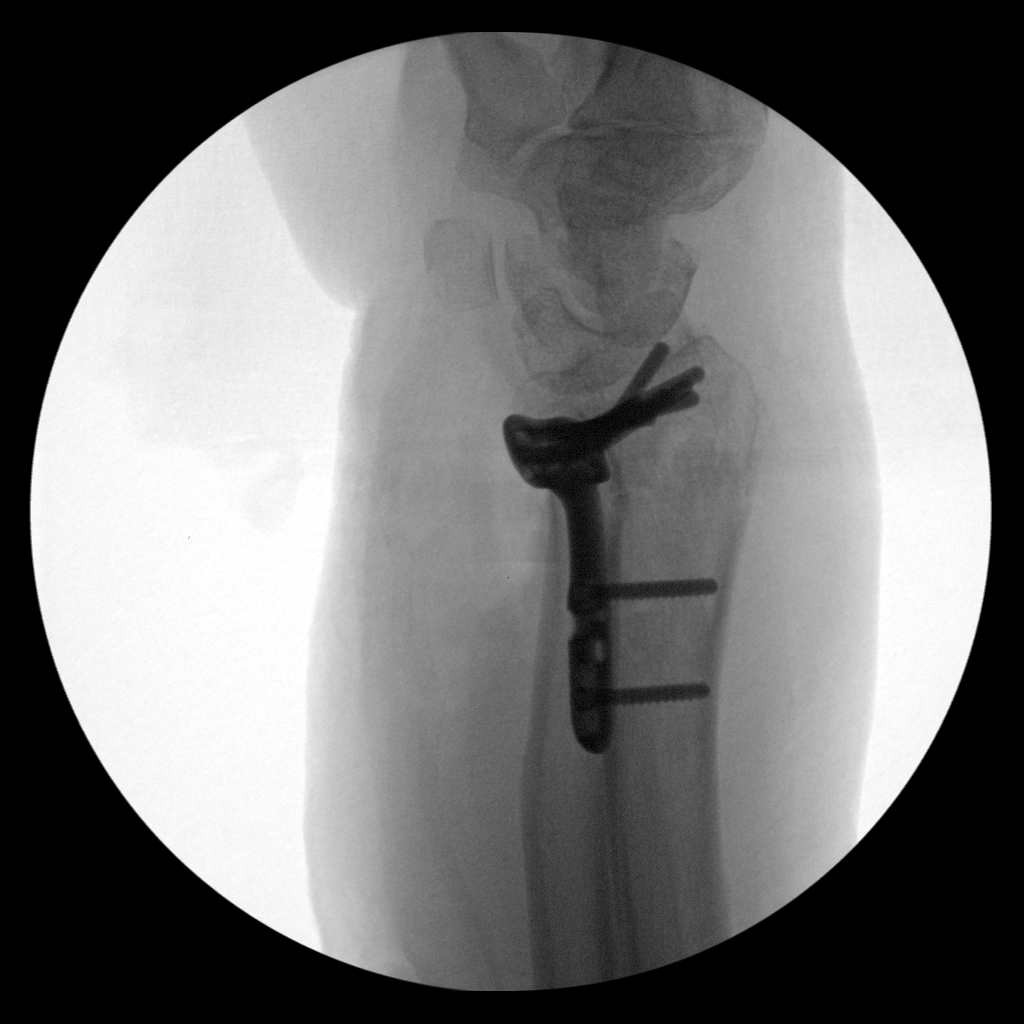
[im 3/5]
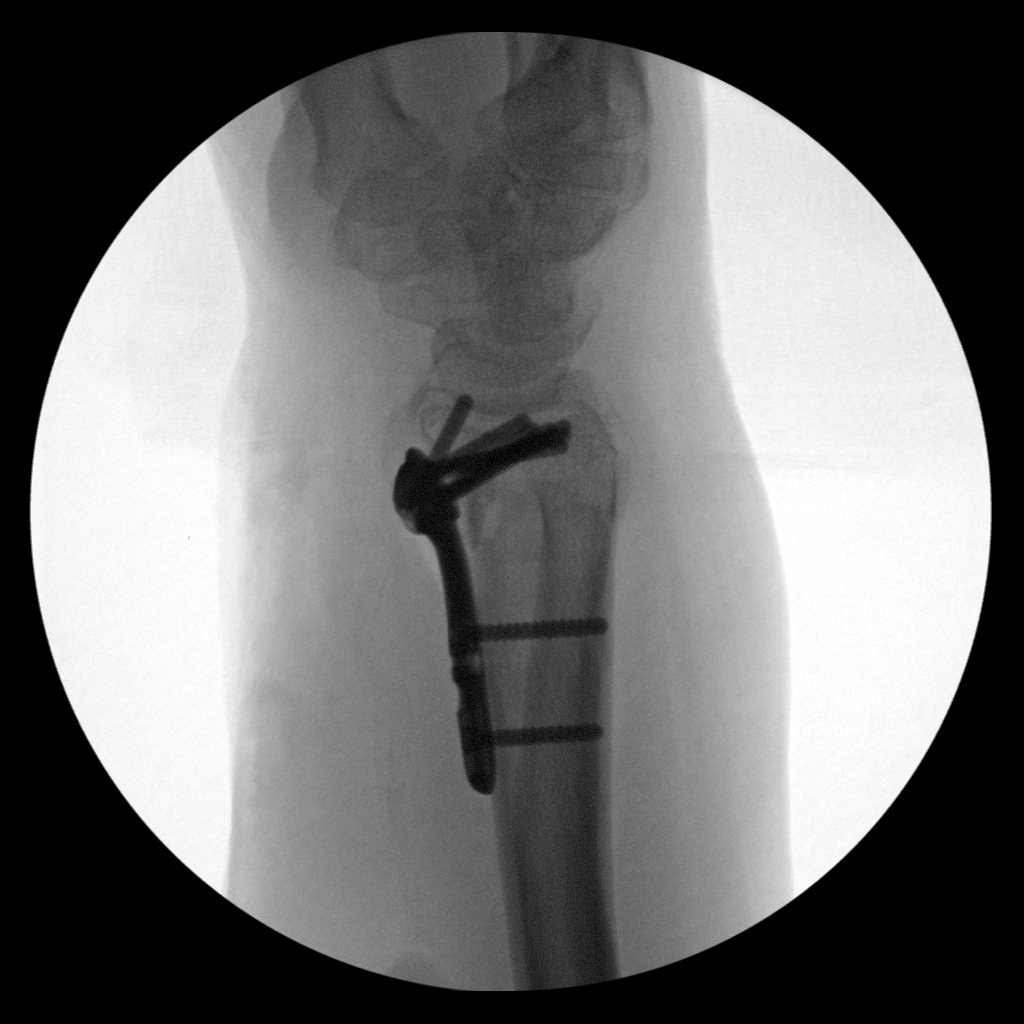
[im 4/5]
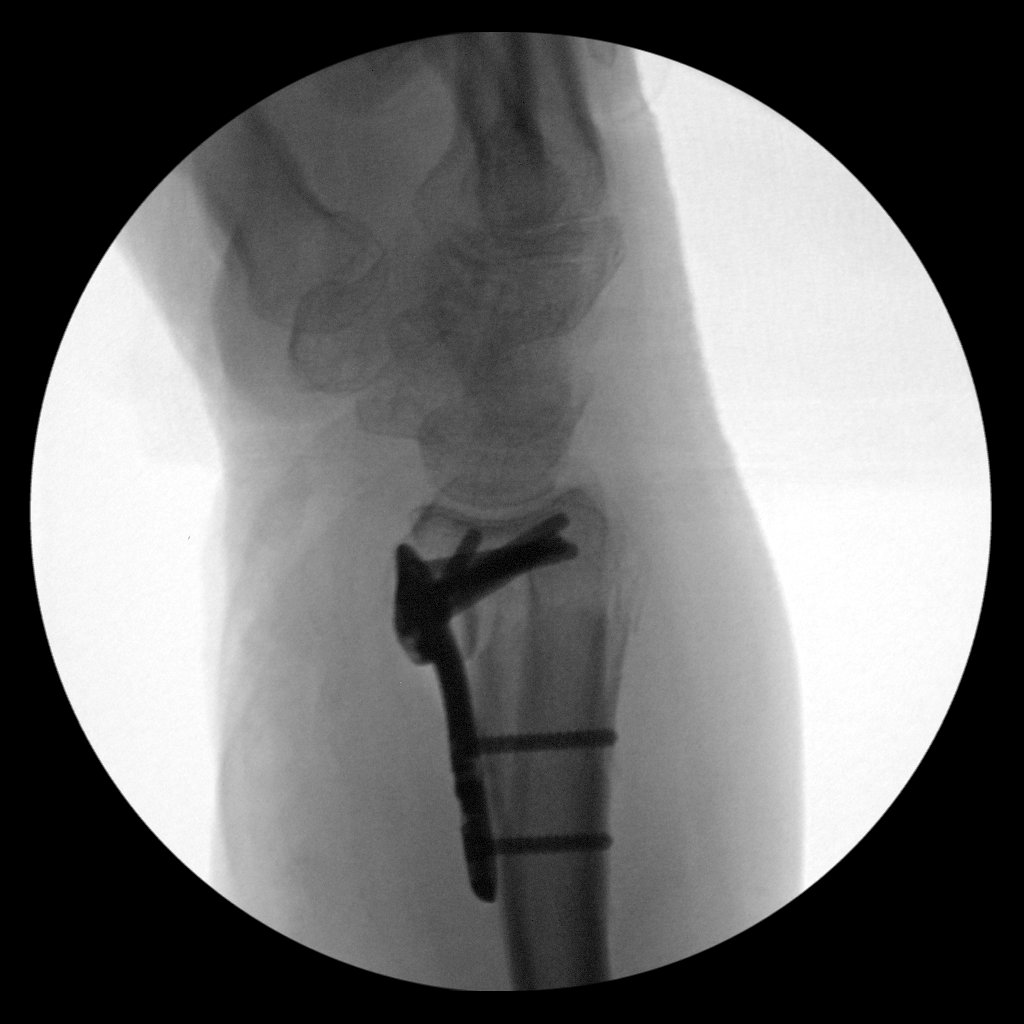
[im 5/5]
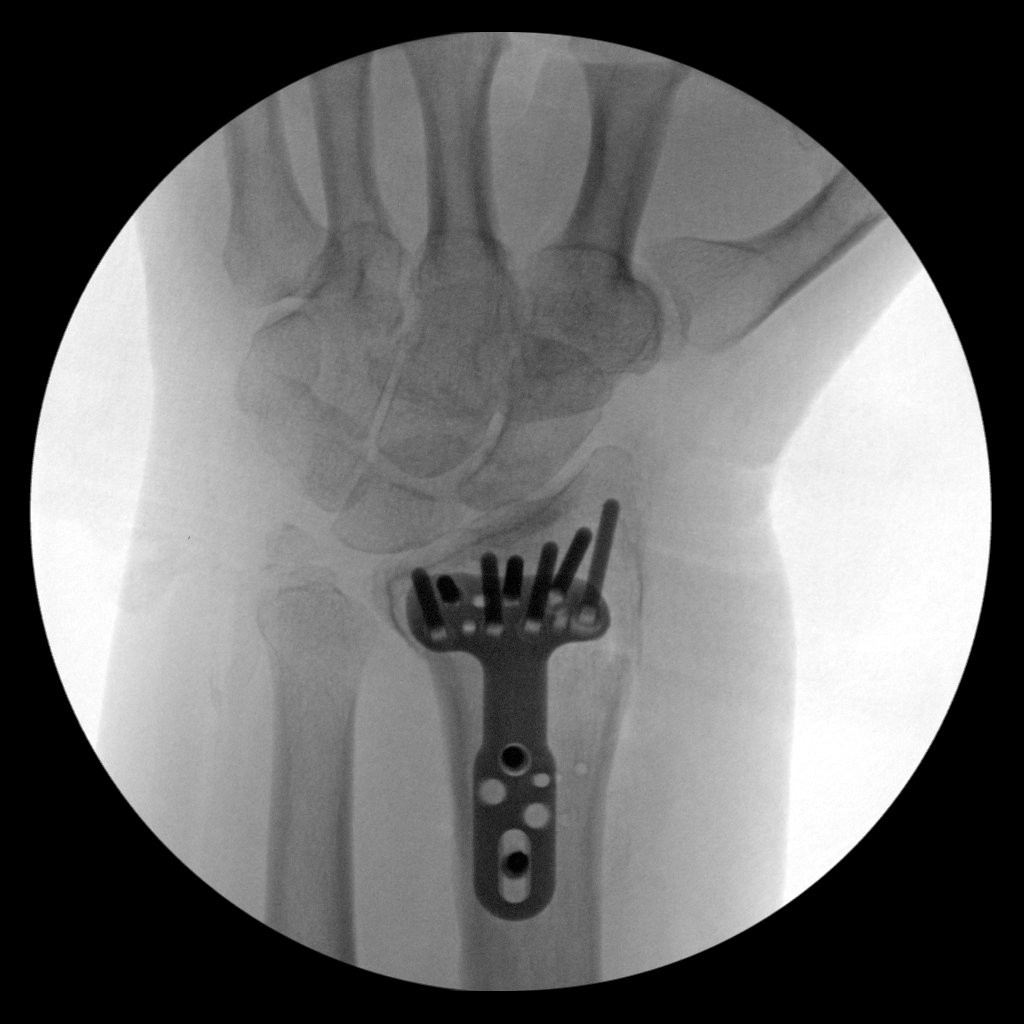

[5 of 5 positions shown; findings below may reference images not displayed]

FINDINGS: Five low resolution intraoperative spot views of the right wrist.
Total fluoroscopy time was 1 minutes 45 seconds. The images
demonstrate surgical plate and multiple screw fixation of distal
radius fracture with anatomic alignment. Ulnar styloid process
fracture is also suspected.
IMPRESSION: Intraoperative fluoroscopic assistance provided during surgical
fixation of distal radius fracture.

## 2020-10-07 DIAGNOSIS — Z1152 Encounter for screening for COVID-19: Secondary | ICD-10-CM | POA: Diagnosis not present

## 2020-10-16 DIAGNOSIS — E113293 Type 2 diabetes mellitus with mild nonproliferative diabetic retinopathy without macular edema, bilateral: Secondary | ICD-10-CM | POA: Diagnosis not present

## 2020-10-19 ENCOUNTER — Telehealth: Payer: Self-pay | Admitting: Cardiology

## 2020-10-19 DIAGNOSIS — Z01810 Encounter for preprocedural cardiovascular examination: Secondary | ICD-10-CM

## 2020-10-19 NOTE — Telephone Encounter (Signed)
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 They are stated that they need a letter from Dr. Bing Matter saying he is okay to drive and the most recent echocardiogram notes. Their office fax number is [phone]. If any questions/comments please call patient. Please call after information has been sent if able to send.

## 2020-10-20 DIAGNOSIS — E782 Mixed hyperlipidemia: Secondary | ICD-10-CM | POA: Diagnosis not present

## 2020-10-20 DIAGNOSIS — E1169 Type 2 diabetes mellitus with other specified complication: Secondary | ICD-10-CM | POA: Diagnosis not present

## 2020-10-20 NOTE — Telephone Encounter (Signed)
Left message for patient to return call.

## 2020-10-20 NOTE — Telephone Encounter (Signed)
In his situation he need to have Lexiscan before clearing him for driving, moderate risks

## 2020-10-21 ENCOUNTER — Telehealth: Payer: Self-pay | Admitting: Cardiology

## 2020-10-21 NOTE — Telephone Encounter (Addendum)
Called patient informed him of scheduled lexiscan appointment. Also informed him of instructions.

## 2020-10-21 NOTE — Telephone Encounter (Signed)
See previous encounter

## 2020-10-21 NOTE — Telephone Encounter (Signed)
Patient is returning Tim Cook call about results. Please advise.

## 2020-10-21 NOTE — Telephone Encounter (Signed)
Called patient informed him that he needs to have a lexiscan per Dr. Bing Matter. He verbally understood order placed. He needs before 11/04/20.    Also will forward to Dr. Bing Matter so he can sign attestation.

## 2020-10-28 ENCOUNTER — Telehealth: Payer: Self-pay

## 2020-10-28 NOTE — Telephone Encounter (Signed)
Spoke with the patient, detailed instructions given. He stated that he would be here for his test in the am. Asked to call back with any questions. S.Dajanee Voorheis EMTP 

## 2020-10-29 ENCOUNTER — Other Ambulatory Visit: Payer: Self-pay

## 2020-10-29 ENCOUNTER — Ambulatory Visit (HOSPITAL_COMMUNITY): Payer: BC Managed Care – PPO | Attending: Internal Medicine

## 2020-10-29 DIAGNOSIS — Z01810 Encounter for preprocedural cardiovascular examination: Secondary | ICD-10-CM | POA: Diagnosis not present

## 2020-10-29 LAB — MYOCARDIAL PERFUSION IMAGING
LV dias vol: 72 mL (ref 62–150)
LV sys vol: 33 mL
Peak HR: 108 {beats}/min
Rest HR: 83 {beats}/min
SDS: 0
SRS: 0
SSS: 0
TID: 1.13

## 2020-10-29 MED ORDER — TECHNETIUM TC 99M TETROFOSMIN IV KIT
32.6000 | PACK | Freq: Once | INTRAVENOUS | Status: AC | PRN
  Administered 2020-10-29: 32.6 via INTRAVENOUS
  Filled 2020-10-29: qty 33

## 2020-10-29 MED ORDER — TECHNETIUM TC 99M TETROFOSMIN IV KIT
10.7000 | PACK | Freq: Once | INTRAVENOUS | Status: AC | PRN
  Administered 2020-10-29: 10.7 via INTRAVENOUS
  Filled 2020-10-29: qty 11

## 2020-10-29 MED ORDER — REGADENOSON 0.4 MG/5ML IV SOLN
0.4000 mg | Freq: Once | INTRAVENOUS | Status: AC
  Administered 2020-10-29: 0.4 mg via INTRAVENOUS

## 2020-11-04 DIAGNOSIS — E1169 Type 2 diabetes mellitus with other specified complication: Secondary | ICD-10-CM | POA: Diagnosis not present

## 2020-11-04 DIAGNOSIS — I10 Essential (primary) hypertension: Secondary | ICD-10-CM | POA: Diagnosis not present

## 2020-11-04 DIAGNOSIS — I2510 Atherosclerotic heart disease of native coronary artery without angina pectoris: Secondary | ICD-10-CM | POA: Diagnosis not present

## 2020-11-04 DIAGNOSIS — R944 Abnormal results of kidney function studies: Secondary | ICD-10-CM | POA: Diagnosis not present

## 2020-11-04 DIAGNOSIS — E782 Mixed hyperlipidemia: Secondary | ICD-10-CM | POA: Diagnosis not present

## 2020-11-11 DIAGNOSIS — E113412 Type 2 diabetes mellitus with severe nonproliferative diabetic retinopathy with macular edema, left eye: Secondary | ICD-10-CM | POA: Diagnosis not present

## 2020-11-11 DIAGNOSIS — H43823 Vitreomacular adhesion, bilateral: Secondary | ICD-10-CM | POA: Diagnosis not present

## 2020-11-11 DIAGNOSIS — E113311 Type 2 diabetes mellitus with moderate nonproliferative diabetic retinopathy with macular edema, right eye: Secondary | ICD-10-CM | POA: Diagnosis not present

## 2020-11-11 DIAGNOSIS — H2513 Age-related nuclear cataract, bilateral: Secondary | ICD-10-CM | POA: Diagnosis not present

## 2020-11-17 NOTE — Telephone Encounter (Signed)
Patient is following up regarding his DOT physical clearance. He is requesting an update on the status of the letter.

## 2020-11-18 ENCOUNTER — Encounter: Payer: Self-pay | Admitting: Emergency Medicine

## 2020-11-18 NOTE — Telephone Encounter (Signed)
His stress test is negative, his echocardiogram looks good.  He is cleared to drive for DOT

## 2020-11-18 NOTE — Telephone Encounter (Signed)
Letter is ready. Informed patient. Patient asked me to fax to [phone]. Will do this.

## 2020-12-01 ENCOUNTER — Other Ambulatory Visit: Payer: Self-pay | Admitting: Orthopedic Surgery

## 2020-12-01 DIAGNOSIS — G5601 Carpal tunnel syndrome, right upper limb: Secondary | ICD-10-CM

## 2020-12-03 ENCOUNTER — Telehealth: Payer: Self-pay | Admitting: Cardiology

## 2020-12-03 NOTE — Telephone Encounter (Signed)
Patient called and said that he got a steroid tablet methylprednisolone 4 mg and wants to know if it will have affect on the stent in his heart. Supposed to start today

## 2020-12-03 NOTE — Telephone Encounter (Signed)
Now, it will not

## 2020-12-03 NOTE — Telephone Encounter (Signed)
Spoke to the patient just now and let him know Dr. Krasowski's recommendations. He verbalizes understanding and thanks me for the call back.  

## 2020-12-15 NOTE — Progress Notes (Signed)
[name] [address] [place] [address] Phone: [phone] Subjective:   [name], am serving as a scribe for Dr. Antoine Primas. This visit occurred during the SARS-CoV-2 public health emergency.  Safety protocols were in place, including screening questions prior to the visit, additional usage of staff PPE, and extensive cleaning of exam room while observing appropriate contact time as indicated for disinfecting solutions.   I'm seeing this patient by the request  of:  Wilfrid Lund, PA  CC: Low back pain  YOV:[id number]  IZSAK MEIR is a 60 y.o. male coming in with complaint of back and neck pain. OMT 03/06/2020. Patient states that he broke humerus a second time and has been sitting around a lot due to having surgery. Pain in lower back and legs has increased. Radiates down into legs to knees. Patient has been trying to walk for exercise and his knees feel like they are doing to buckle due to sharp pain. Is using gabapentin at night. Patient was on steroid but his pain came back after use last week. Methylprednisone dose pack from Dr. Dion Saucier.        Reviewed prior external information including notes and imaging from previsou exam, outside providers and external EMR if available.   As well as notes that were available from care everywhere and other healthcare systems.  Past medical history, social, surgical and family history all reviewed in electronic medical record.  No pertanent information unless stated regarding to the chief complaint.   Past Medical History:  Diagnosis Date  . Acute lateral meniscal tear 06/09/2016  . Arthritis    "knees, lower back, hands" (05/18/2016)  . Bariatric surgery status 05/18/2016  . Bursitis of right shoulder 01/21/2019  . CAD (coronary artery disease), native coronary artery    05/18/16  Cath severe 90% LAD prox, 99% mid, Circ dominant with 30% OM1, 50% OM2, 30% OM3, 60% distal circ and PL, severe  stenosis nondominant RCA.  EF 55%  3.0x 20, 2.5 x 20 mm, 2.25 x 16 mm Promus premier sent to LAD Dr. Clifton James   . Chronic bronchitis (HCC)    "most years" (05/18/2016)  . Chronic lower back pain   . Closed fracture of right distal radius 03/12/2020  . Closed fracture of right proximal humerus 03/12/2020  . Controlled type 2 diabetes with retinopathy (HCC)   . DDD (degenerative disc disease), lumbar   . Degenerative arthritis of right knee 05/09/2017  . Degenerative disc disease, lumbar 07/07/2016  . Dislocation of left middle finger 01/21/2019  . Hyperlipidemia   . Hypertensive heart disease without CHF   . Nonallopathic lesion of lumbosacral region 07/07/2016  . Nonallopathic lesion of sacral region 07/07/2016  . Nonallopathic lesion of thoracic region 07/07/2016  . Sleep apnea    "dx'd; never wore mask" (05/18/2016)  . Squamous carcinoma    cut off right upper arm, left shoulder; froze off nose and right side of face  . Type II diabetes mellitus (HCC)   . Unstable angina (HCC)   . Vomiting     No Known Allergies   Review of Systems:  No headache, visual changes, nausea, vomiting, diarrhea, constipation, dizziness, abdominal pain, skin rash, fevers, chills, night sweats, weight loss, swollen lymph nodes, , joint swelling, chest pain, shortness of breath, mood changes. POSITIVE muscle aches, body aches  Objective  Blood pressure 118/64, pulse (!) 105, height 5\' 8"  (1.727 m), weight 253 lb (114.8 kg), SpO2 98 %.  General: No apparent distress alert and oriented x3 mood and affect normal, dressed appropriately.  HEENT: Pupils equal, extraocular movements intact  Respiratory: Patient's speak in full sentences and does not appear short of breath  Cardiovascular: No lower extremity edema, non tender, no erythema   Gait normal with good balance and coordination.  MSK: Patient does have limited range of motion of the right shoulder noted. Patient does have tenderness to palpation of the  paraspinal musculature of the lumbar spine right greater than left.  Patient though does have pain in the left sacroiliac joint.  Negative straight leg test but does have tightness of the hamstrings.  Unable to do Pearlean Brownie very well secondary to tightness.  Limited extension of the back noted as well.     Assessment and Plan:       The above documentation has been reviewed and is accurate and complete Judi Saa, DO       Note: This dictation was prepared with Dragon dictation along with smaller phrase technology. Any transcriptional errors that result from this process are unintentional.

## 2020-12-16 ENCOUNTER — Ambulatory Visit: Payer: BC Managed Care – PPO | Admitting: Family Medicine

## 2020-12-16 ENCOUNTER — Ambulatory Visit (INDEPENDENT_AMBULATORY_CARE_PROVIDER_SITE_OTHER): Payer: BC Managed Care – PPO

## 2020-12-16 ENCOUNTER — Encounter: Payer: Self-pay | Admitting: Family Medicine

## 2020-12-16 ENCOUNTER — Other Ambulatory Visit: Payer: Self-pay

## 2020-12-16 ENCOUNTER — Other Ambulatory Visit: Payer: Self-pay | Admitting: Family Medicine

## 2020-12-16 VITALS — BP 118/64 | HR 105 | Ht 68.0 in | Wt 253.0 lb

## 2020-12-16 DIAGNOSIS — M545 Low back pain, unspecified: Secondary | ICD-10-CM

## 2020-12-16 DIAGNOSIS — G8929 Other chronic pain: Secondary | ICD-10-CM

## 2020-12-16 MED ORDER — TIZANIDINE HCL 2 MG PO TABS
2.0000 mg | ORAL_TABLET | Freq: Every day | ORAL | 0 refills | Status: DC
Start: 2020-12-16 — End: 2021-01-07

## 2020-12-16 MED ORDER — METHYLPREDNISOLONE ACETATE 40 MG/ML IJ SUSP
40.0000 mg | Freq: Once | INTRAMUSCULAR | Status: AC
  Administered 2020-12-16: 40 mg via INTRAMUSCULAR

## 2020-12-16 NOTE — Assessment & Plan Note (Addendum)
Patient has chronic low back pain with radicular symptoms at this time.  Patient is having some increase in this.  We discussed continuing the gabapentin given a very low dose of Zanaflex to take at night if necessary.  Warned of potential side effects.  No history of seizure disorder.  Attempted very mild osteopathic manipulation with more muscle energy but patient has significant tightness and discontinue immediately.  Patient was given a shot of Depo-Medrol today as well.  We will hold off on patient having another round of oral steroids if possible.

## 2020-12-16 NOTE — Patient Instructions (Signed)
See me again in 3 weeks Xray on way out Exercises 3x a  Week Zanaflex at night 2mg  as needed

## 2021-01-06 NOTE — Progress Notes (Signed)
[name] [address] [place] [address] Phone: [phone] Subjective:   Tim Cook, am serving as a scribe for Dr. Antoine Primas. This visit occurred during the SARS-CoV-2 public health emergency.  Safety protocols were in place, including screening questions prior to the visit, additional usage of staff PPE, and extensive cleaning of exam room while observing appropriate contact time as indicated for disinfecting solutions.  I'm seeing this patient by the request  of:  Wilfrid Lund, PA  CC: Low back pain follow-up  ALP:[id number]   12/16/2020 Patient has chronic low back pain with radicular symptoms at this time.  Patient is having some increase in this.  We discussed continuing the gabapentin given a very low dose of Zanaflex to take at night if necessary.  Warned of potential side effects.  No history of seizure disorder.  Attempted very mild osteopathic manipulation with more muscle energy but patient has significant tightness and discontinue immediately.  Patient was given a shot of Depo-Medrol today as well.  We will hold off on patient having another round of oral steroids if possible.  Update 01/07/2021 Tim Cook is a 60 y.o. male coming in with complaint of low back pain. Patient states that is it hard for him to climb in and out of the truck. Pain will be sharp intermittently with walking. No change in pain since last visit.  Patient states if anything potentially is little bit more worse at this time.   Lumbar xray 12/16/2020 IMPRESSION: Spondylosis, without acute osseous abnormality.  Aortic Atherosclerosis (ICD10-I70.0).    Past Medical History:  Diagnosis Date  . Acute lateral meniscal tear 06/09/2016  . Arthritis    "knees, lower back, hands" (05/18/2016)  . Bariatric surgery status 05/18/2016  . Bursitis of right shoulder 01/21/2019  . CAD (coronary artery disease), native coronary artery    05/18/16  Cath severe 90%  LAD prox, 99% mid, Circ dominant with 30% OM1, 50% OM2, 30% OM3, 60% distal circ and PL, severe stenosis nondominant RCA.  EF 55%  3.0x 20, 2.5 x 20 mm, 2.25 x 16 mm Promus premier sent to LAD Dr. Clifton James   . Chronic bronchitis (HCC)    "most years" (05/18/2016)  . Chronic lower back pain   . Closed fracture of right distal radius 03/12/2020  . Closed fracture of right proximal humerus 03/12/2020  . Controlled type 2 diabetes with retinopathy (HCC)   . DDD (degenerative disc disease), lumbar   . Degenerative arthritis of right knee 05/09/2017  . Degenerative disc disease, lumbar 07/07/2016  . Dislocation of left middle finger 01/21/2019  . Hyperlipidemia   . Hypertensive heart disease without CHF   . Nonallopathic lesion of lumbosacral region 07/07/2016  . Nonallopathic lesion of sacral region 07/07/2016  . Nonallopathic lesion of thoracic region 07/07/2016  . Sleep apnea    "dx'd; never wore mask" (05/18/2016)  . Squamous carcinoma    cut off right upper arm, left shoulder; froze off nose and right side of face  . Type II diabetes mellitus (HCC)   . Unstable angina (HCC)   . Vomiting    Past Surgical History:  Procedure Laterality Date  . CARDIAC CATHETERIZATION N/A 05/18/2016   Procedure: Left Heart Cath and Coronary Angiography;  Surgeon: Kathleene Hazel, MD;  Location: [facility] INVASIVE CV LAB;  Service: Cardiovascular;  Laterality: N/A;  . CARDIAC CATHETERIZATION N/A 05/18/2016   Procedure: Coronary Stent Intervention;  Surgeon: Kathleene Hazel, MD;  Location: [facility] INVASIVE CV LAB;  Service: Cardiovascular;  Laterality: N/A;  . CARPAL TUNNEL RELEASE Right 03/17/2020   Procedure: CARPAL TUNNEL RELEASE;  Surgeon: Teryl Lucy, MD;  Location: [facility] OR[facility];  Service: Orthopedics;  Laterality: Right;  . CORONARY ANGIOPLASTY    . FOOT FRACTURE SURGERY Right 1970s  . LAPAROSCOPIC GASTRIC BANDING  07/2007   by Dr. Daphine Deutscher  . ORIF HUMERUS FRACTURE Right 03/17/2020   Procedure: OPEN REDUCTION  INTERNAL FIXATION (ORIF) PROXIMAL HUMERUS FRACTURE AND DISTAL RADIUS FRACTURE;  Surgeon: Teryl Lucy, MD;  Location: [facility] OR[facility];  Service: Orthopedics;  Laterality: Right;  . PILONIDAL CYST EXCISION  ~ 1985   "took 8 out all at once"  . SQUAMOUS CELL CARCINOMA EXCISION     right upper arm, left shoulder   Social History   Socioeconomic History  . Marital status: Married    Spouse name: Not on file  . Number of children: Not on file  . Years of education: Not on file  . Highest education level: Not on file  Occupational History  . Not on file  Tobacco Use  . Smoking status: Former Smoker    Packs/day: 0.50    Years: 12.00    Pack years: 6.00    Types: Cigarettes    Quit date: 12/15/1991    Years since quitting: 29.0  . Smokeless tobacco: Former Neurosurgeon    Types: Snuff  . Tobacco comment: "quit snuff in ~ 1981"  Vaping Use  . Vaping Use: Never used  Substance and Sexual Activity  . Alcohol use: Yes    Alcohol/week: 7.0 standard drinks    Types: 7 Cans of beer per week    Comment: More social use  . Drug use: No  . Sexual activity: Yes  Other Topics Concern  . Not on file  Social History Narrative  . Not on file   Social Determinants of Health   Financial Resource Strain: Not on file  Food Insecurity: Not on file  Transportation Needs: Not on file  Physical Activity: Not on file  Stress: Not on file  Social Connections: Not on file   No Known Allergies Family History  Problem Relation Age of Onset  . Heart disease Mother   . Heart disease Father   . Stroke Father   . Heart attack Father   . Heart disease Sister   . Diabetes Mellitus I Sister   . Stroke Brother     Current Outpatient Medications (Endocrine & Metabolic):  Marland Kitchen  FARXIGA 10 MG TABS tablet, Take 10 mg by mouth daily.  .  metFORMIN (GLUCOPHAGE) 500 MG tablet, Take 1,000 mg by mouth 2 (two) times daily with a meal.  .  RYBELSUS 7 MG TABS, Take 7 mg by mouth daily.  Current Outpatient Medications  (Cardiovascular):  .  atorvastatin (LIPITOR) 40 MG tablet,  .  losartan (COZAAR) 100 MG tablet, 100 mg.  .  nitroGLYCERIN (NITROSTAT) 0.4 MG SL tablet, PLACE 1 TABLET (0.4 MG TOTAL) UNDER THE TONGUE EVERY 5 (FIVE) MINUTES AS NEEDED FOR CHEST PAIN.   Current Outpatient Medications (Analgesics):  .  aspirin EC 81 MG tablet, Take 81 mg by mouth daily.   Current Outpatient Medications (Other):  .  gabapentin (NEURONTIN) 100 MG capsule, TAKE 2 CAPSULES (200 MG TOTAL) BY MOUTH AT BEDTIME. .  Multiple Vitamin (MULTIVITAMIN) capsule, Take 1 capsule by mouth daily. .  Vitamin D, Ergocalciferol, (DRISDOL) 1.25 MG (50000 UNIT) CAPS capsule, TAKE 1 CAPSULE EVERY 7 DAYS .  tiZANidine (ZANAFLEX) 2 MG tablet, TAKE 1 TABLET BY MOUTH AT BEDTIME.   Reviewed prior external information including notes and imaging from  primary care provider As well as notes that were available from care everywhere and other healthcare systems.  Past medical history, social, surgical and family history all reviewed in electronic medical record.  No pertanent information unless stated regarding to the chief complaint.   Review of Systems:  No headache, visual changes, nausea, vomiting, diarrhea, constipation, dizziness, abdominal pain, skin rash, fevers, chills, night sweats, weight loss, swollen lymph nodes, body aches, joint swelling, chest pain, shortness of breath, mood changes. POSITIVE muscle aches  Objective  Blood pressure 112/70, pulse (!) 104, height 5\' 8"  (1.727 m), weight 253 lb (114.8 kg), SpO2 97 %.   General: No apparent distress alert and oriented x3 mood and affect normal, dressed appropriately.  HEENT: Pupils equal, extraocular movements intact  Respiratory: Patient's speak in full sentences and does not appear short of breath  Cardiovascular: No lower extremity edema, non tender, no erythema  Gait antalgic with patient favoring his back. Patient does have loss of lordosis.  No significant tightness  noted patient does have mild radicular symptoms it seems like actually with straight leg test on the right side.  Worsening pain with anything of extension also of the back of 5 degrees.  Poor core strength noted. Patient noted does appear neurovascularly intact distally.   Impression and Recommendations:     The above documentation has been reviewed and is accurate and complete , DO

## 2021-01-07 ENCOUNTER — Other Ambulatory Visit: Payer: Self-pay | Admitting: Family Medicine

## 2021-01-07 ENCOUNTER — Other Ambulatory Visit: Payer: Self-pay

## 2021-01-07 ENCOUNTER — Encounter: Payer: Self-pay | Admitting: Family Medicine

## 2021-01-07 ENCOUNTER — Ambulatory Visit: Payer: BC Managed Care – PPO | Admitting: Family Medicine

## 2021-01-07 VITALS — BP 112/70 | HR 104 | Ht 68.0 in | Wt 253.0 lb

## 2021-01-07 DIAGNOSIS — M5136 Other intervertebral disc degeneration, lumbar region: Secondary | ICD-10-CM

## 2021-01-07 DIAGNOSIS — M545 Low back pain, unspecified: Secondary | ICD-10-CM | POA: Diagnosis not present

## 2021-01-07 DIAGNOSIS — G8929 Other chronic pain: Secondary | ICD-10-CM | POA: Diagnosis not present

## 2021-01-07 MED ORDER — METHYLPREDNISOLONE ACETATE 40 MG/ML IJ SUSP
40.0000 mg | Freq: Once | INTRAMUSCULAR | Status: AC
Start: 2021-01-07 — End: 2021-01-07
  Administered 2021-01-07: 40 mg via INTRAMUSCULAR

## 2021-01-07 NOTE — Assessment & Plan Note (Signed)
Known degenerative disc disease.  Concerned that patient is also potentially having some facet arthritic changes that also could be causing some mild lumbar radiculopathy.  Patient is pain is getting possibly worse at this time.  Patient is even having pain when he is driving in a car.  I feel like at this point we need to consider advanced imaging.  An MRI of the lumbar spine is ordered today for further evaluation follow-up with me again after imaging we will discuss if patient is a candidate for injections, or formal physical therapy or if need for surgical intervention.

## 2021-01-07 NOTE — Patient Instructions (Signed)
Good to see you  MRI of lumbar spine ordered Call [phone] to schedule Injection given today  We will be in touch once the results are in from the MRI

## 2021-01-14 ENCOUNTER — Ambulatory Visit
Admission: RE | Admit: 2021-01-14 | Discharge: 2021-01-14 | Disposition: A | Discharge status: Home / Self Care | Payer: BC Managed Care – PPO | Source: Ambulatory Visit | Attending: Family Medicine | Admitting: Family Medicine

## 2021-01-14 ENCOUNTER — Other Ambulatory Visit: Payer: Self-pay

## 2021-01-14 DIAGNOSIS — M545 Low back pain, unspecified: Secondary | ICD-10-CM | POA: Diagnosis not present

## 2021-01-14 DIAGNOSIS — M48061 Spinal stenosis, lumbar region without neurogenic claudication: Secondary | ICD-10-CM | POA: Diagnosis not present

## 2021-01-14 DIAGNOSIS — G8929 Other chronic pain: Secondary | ICD-10-CM

## 2021-01-15 ENCOUNTER — Other Ambulatory Visit: Payer: Self-pay

## 2021-01-15 DIAGNOSIS — M5416 Radiculopathy, lumbar region: Secondary | ICD-10-CM

## 2021-01-22 ENCOUNTER — Other Ambulatory Visit: Payer: Self-pay

## 2021-01-22 ENCOUNTER — Ambulatory Visit
Admission: RE | Admit: 2021-01-22 | Discharge: 2021-01-22 | Disposition: A | Discharge status: Home / Self Care | Payer: BC Managed Care – PPO | Source: Ambulatory Visit | Attending: Family Medicine | Admitting: Family Medicine

## 2021-01-22 DIAGNOSIS — M5416 Radiculopathy, lumbar region: Secondary | ICD-10-CM

## 2021-01-22 MED ORDER — METHYLPREDNISOLONE ACETATE 40 MG/ML INJ SUSP (RADIOLOG
80.0000 mg | Freq: Once | INTRAMUSCULAR | Status: AC
  Administered 2021-01-22: 80 mg via EPIDURAL

## 2021-01-22 MED ORDER — IOPAMIDOL (ISOVUE-M 200) INJECTION 41%
1.0000 mL | Freq: Once | INTRAMUSCULAR | Status: AC
  Administered 2021-01-22: 1 mL via EPIDURAL

## 2021-01-22 NOTE — Discharge Instructions (Signed)
Post Procedure Spinal Discharge Instruction Sheet  1. You may resume a regular diet and any medications that you routinely take (including pain medications).  2. No driving day of procedure.  3. Light activity throughout the rest of the day.  Do not do any strenuous work, exercise, bending or lifting.  The day following the procedure, you can resume normal physical activity but you should refrain from exercising or physical therapy for at least three days thereafter.   Common Side Effects:   Headaches- take your usual medications as directed by your physician.  Increase your fluid intake.  Caffeinated beverages may be helpful.  Lie flat in bed until your headache resolves.   Restlessness or inability to sleep- you may have trouble sleeping for the next few days.  Ask your referring physician if you need any medication for sleep.   Facial flushing or redness- should subside within a few days.   Increased pain- a temporary increase in pain a day or two following your procedure is not unusual.  Take your pain medication as prescribed by your referring physician.   Leg cramps  Please contact our office at [phone] for the following symptoms:  Fever greater than 100 degrees.  Headaches unresolved with medication after 2-3 days.  Increased swelling, pain, or redness at injection site.  IF ASPRIN WAS STOPPED FOR THIS PROCEDURE, YOU MAY RESUME IT TODAY 

## 2021-01-28 ENCOUNTER — Other Ambulatory Visit: Payer: Self-pay

## 2021-01-28 DIAGNOSIS — I10 Essential (primary) hypertension: Secondary | ICD-10-CM

## 2021-01-28 DIAGNOSIS — Z23 Encounter for immunization: Secondary | ICD-10-CM

## 2021-01-28 DIAGNOSIS — K219 Gastro-esophageal reflux disease without esophagitis: Secondary | ICD-10-CM

## 2021-01-28 DIAGNOSIS — E1165 Type 2 diabetes mellitus with hyperglycemia: Secondary | ICD-10-CM

## 2021-01-28 DIAGNOSIS — N529 Male erectile dysfunction, unspecified: Secondary | ICD-10-CM

## 2021-01-28 DIAGNOSIS — E113299 Type 2 diabetes mellitus with mild nonproliferative diabetic retinopathy without macular edema, unspecified eye: Secondary | ICD-10-CM

## 2021-01-28 DIAGNOSIS — E559 Vitamin D deficiency, unspecified: Secondary | ICD-10-CM

## 2021-01-28 DIAGNOSIS — M25519 Pain in unspecified shoulder: Secondary | ICD-10-CM

## 2021-01-28 HISTORY — DX: Pain in unspecified shoulder: M25.519

## 2021-01-28 HISTORY — DX: Encounter for immunization: Z23

## 2021-01-28 HISTORY — DX: Gastro-esophageal reflux disease without esophagitis: K21.9

## 2021-01-28 HISTORY — DX: Male erectile dysfunction, unspecified: N52.9

## 2021-01-28 HISTORY — DX: Essential (primary) hypertension: I10

## 2021-01-28 HISTORY — DX: Type 2 diabetes mellitus with mild nonproliferative diabetic retinopathy without macular edema, unspecified eye: E11.3299

## 2021-01-28 HISTORY — DX: Vitamin D deficiency, unspecified: E55.9

## 2021-01-28 HISTORY — DX: Type 2 diabetes mellitus with hyperglycemia: E11.65

## 2021-02-02 ENCOUNTER — Ambulatory Visit: Payer: BC Managed Care – PPO | Admitting: Cardiology

## 2021-02-02 ENCOUNTER — Encounter: Payer: Self-pay | Admitting: Cardiology

## 2021-02-02 ENCOUNTER — Other Ambulatory Visit: Payer: Self-pay

## 2021-02-02 VITALS — BP 114/76 | HR 93 | Ht 69.0 in | Wt 248.0 lb

## 2021-02-02 DIAGNOSIS — I10 Essential (primary) hypertension: Secondary | ICD-10-CM | POA: Diagnosis not present

## 2021-02-02 DIAGNOSIS — I2510 Atherosclerotic heart disease of native coronary artery without angina pectoris: Secondary | ICD-10-CM

## 2021-02-02 DIAGNOSIS — E782 Mixed hyperlipidemia: Secondary | ICD-10-CM

## 2021-02-02 DIAGNOSIS — M5136 Other intervertebral disc degeneration, lumbar region: Secondary | ICD-10-CM | POA: Diagnosis not present

## 2021-02-02 NOTE — Patient Instructions (Signed)

## 2021-02-02 NOTE — Progress Notes (Signed)
Cardiology Office Note:    Date:  02/02/2021   ID:  Tim Cook, DOB [birth year], MRN [id number]  PCP:  Wilfrid Lund, PA  Cardiologist:  Gypsy Balsam, MD    Referring MD: Wilfrid Lund, [place]   Chief Complaint  Patient presents with  . Follow-up    [name] results    History of Present Illness:    Tim Cook is a 60 y.o. male with past medical history significant for coronary artery disease.  In 2017 he was noted to have 90% proximal LAD and 90% mid LAD that required stenting.  His past medical history also significant for essential hypertension, diabetes, obesity, sleep apnea, dyslipidemia. He is coming today to my office for follow-up overall he is doing well problem is his chronic back problem he does have some cyst over there and he cannot exercise is much as he needs to.  He is trying to walk some with his wife but he cannot simply keep up with her.  Denies have any chest pain tightness squeezing pressure burning chest.  Past Medical History:  Diagnosis Date  . Acute lateral meniscal tear 06/09/2016  . Arthritis    "knees, lower back, hands" (05/18/2016)  . Atherosclerotic heart disease of native coronary artery without angina pectoris    05/18/16  Cath severe 90% LAD prox, 99% mid, Circ dominant with 30% OM1, 50% OM2, 30% OM3, 60% distal circ and PL, severe stenosis nondominant RCA.  EF 55%  3.0x 20, 2.5 x 20 mm, 2.25 x 16 mm Promus premier sent to LAD Dr. Clifton James   . Bariatric surgery status 05/18/2016  . Benign essential hypertension 01/28/2021  . Bursitis of right shoulder 01/21/2019  . CAD (coronary artery disease), native coronary artery    05/18/16  Cath severe 90% LAD prox, 99% mid, Circ dominant with 30% OM1, 50% OM2, 30% OM3, 60% distal circ and PL, severe stenosis nondominant RCA.  EF 55%  3.0x 20, 2.5 x 20 mm, 2.25 x 16 mm Promus premier sent to LAD Dr. Clifton James   . Chronic bronchitis (HCC)    "most years" (05/18/2016)  . Chronic lower back pain   . Closed  fracture of right distal radius 03/12/2020  . Closed fracture of right proximal humerus 03/12/2020  . Controlled type 2 diabetes with retinopathy (HCC)   . DDD (degenerative disc disease), lumbar   . Degenerative arthritis of right knee 05/09/2017  . Degenerative disc disease, lumbar 07/07/2016  . Dislocation of left middle finger 01/21/2019  . Encounter for immunization 01/28/2021  . Erectile dysfunction 01/28/2021  . Hyperglycemia due to type 2 diabetes mellitus (HCC) 01/28/2021  . Hyperlipidemia   . Hypertensive heart disease without CHF   . 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 type 2 diabetes mellitus (HCC) 01/28/2021  . Mixed hyperlipidemia 05/18/2016  . Nonallopathic lesion of lumbosacral region 07/07/2016  . Nonallopathic lesion of sacral region 07/07/2016  . Nonallopathic lesion of thoracic region 07/07/2016  . Obesity   . Shoulder joint pain 01/28/2021  . Sleep apnea    "dx'd; never wore mask" (05/18/2016)  . Squamous carcinoma    cut off right upper arm, left shoulder; froze off nose and right side of face  . Type II diabetes mellitus (HCC)   . Unstable angina (HCC)   . Vitamin D deficiency 01/28/2021  . Vomiting     Past Surgical History:  Procedure Laterality Date  . CARDIAC CATHETERIZATION N/A 05/18/2016   Procedure: Left Heart  Cath and Coronary Angiography;  Surgeon: Kathleene Hazel, MD;  Location: [facility] INVASIVE CV LAB;  Service: Cardiovascular;  Laterality: N/A;  . CARDIAC CATHETERIZATION N/A 05/18/2016   Procedure: Coronary Stent Intervention;  Surgeon: Kathleene Hazel, MD;  Location: [facility] INVASIVE CV LAB;  Service: Cardiovascular;  Laterality: N/A;  . CARPAL TUNNEL RELEASE Right 03/17/2020   Procedure: CARPAL TUNNEL RELEASE;  Surgeon: Teryl Lucy, MD;  Location: [facility];  Service: Orthopedics;  Laterality: Right;  . CORONARY ANGIOPLASTY    . FOOT FRACTURE SURGERY Right 1970s  . LAPAROSCOPIC GASTRIC BANDING  07/2007   by Dr.  Daphine Deutscher  . ORIF HUMERUS FRACTURE Right 03/17/2020   Procedure: OPEN REDUCTION INTERNAL FIXATION (ORIF) PROXIMAL HUMERUS FRACTURE AND DISTAL RADIUS FRACTURE;  Surgeon: Teryl Lucy, MD;  Location: [facility];  Service: Orthopedics;  Laterality: Right;  . PILONIDAL CYST EXCISION  ~ 1985   "took 8 out all at once"  . SQUAMOUS CELL CARCINOMA EXCISION     right upper arm, left shoulder    Current Medications: Current Meds  Medication Sig  . aspirin EC 81 MG tablet Take 81 mg by mouth daily.  Marland Kitchen atorvastatin (LIPITOR) 40 MG tablet Take 40 mg by mouth daily.  Marland Kitchen FARXIGA 10 MG TABS tablet Take 10 mg by mouth daily.   Marland Kitchen gabapentin (NEURONTIN) 100 MG capsule TAKE 2 CAPSULES (200 MG TOTAL) BY MOUTH AT BEDTIME.  Marland Kitchen HYDROcodone-acetaminophen (NORCO) 10-325 MG tablet Take 1 tablet by mouth as needed for pain.  Marland Kitchen loratadine (CLARITIN) 10 MG tablet Take 1 tablet by mouth daily.  Marland Kitchen losartan (COZAAR) 100 MG tablet Take 100 mg by mouth daily.  . metFORMIN (GLUCOPHAGE) 500 MG tablet Take 1,000 mg by mouth 2 (two) times daily with a meal.   . Multiple Vitamin (MULTIVITAMIN) capsule Take 1 capsule by mouth daily. Unknown strength  . nitroGLYCERIN (NITROSTAT) 0.4 MG SL tablet PLACE 1 TABLET (0.4 MG TOTAL) UNDER THE TONGUE EVERY 5 (FIVE) MINUTES AS NEEDED FOR CHEST PAIN. (Patient taking differently: Place 0.4 mg under the tongue every 5 (five) minutes as needed for chest pain.)  . RYBELSUS 7 MG TABS Take 14 mg by mouth daily.  Marland Kitchen tiZANidine (ZANAFLEX) 2 MG tablet TAKE 1 TABLET BY MOUTH AT BEDTIME. (Patient taking differently: Take 2 mg by mouth at bedtime.)  . Vitamin D, Ergocalciferol, (DRISDOL) 1.25 MG (50000 UNIT) CAPS capsule TAKE 1 CAPSULE EVERY 7 DAYS (Patient taking differently: Take 50,000 Units by mouth every 7 (seven) days.)     Allergies:   Patient has no known allergies.   Social History   Socioeconomic History  . Marital status: Married    Spouse name: Not on file  . Number of children: Not on file   . Years of education: Not on file  . Highest education level: Not on file  Occupational History  . Not on file  Tobacco Use  . Smoking status: Former Smoker    Packs/day: 0.50    Years: 12.00    Pack years: 6.00    Types: Cigarettes    Quit date: 12/15/1991    Years since quitting: 29.1  . Smokeless tobacco: Former Neurosurgeon    Types: Snuff  . Tobacco comment: "quit snuff in ~ 1981"  Vaping Use  . Vaping Use: Never used  Substance and Sexual Activity  . Alcohol use: Yes    Alcohol/week: 7.0 standard drinks    Types: 7 Cans of beer per week    Comment: More social use  .  Drug use: No  . Sexual activity: Yes  Other Topics Concern  . Not on file  Social History Narrative  . Not on file   Social Determinants of Health   Financial Resource Strain: Not on file  Food Insecurity: Not on file  Transportation Needs: Not on file  Physical Activity: Not on file  Stress: Not on file  Social Connections: Not on file     Family History: The patient's family history includes Diabetes Mellitus I in his sister; Heart attack in his father; Heart disease in his father, mother, and sister; Stroke in his brother and father. ROS:   Please see the history of present illness.    All 14 point review of systems negative except as described per history of present illness  EKGs/Labs/Other Studies Reviewed:      Recent Labs: 03/18/2020: BUN 24; Creatinine, Ser 1.12; Hemoglobin 12.2; Platelets 342; Potassium 4.2; Sodium 132  Recent Lipid Panel    Component Value Date/Time   CHOL 125 12/07/2018 1520   TRIG 103 12/07/2018 1520   HDL 57 12/07/2018 1520   CHOLHDL 2.2 12/07/2018 1520   LDLCALC 47 12/07/2018 1520    Physical Exam:    VS:  BP 114/76 (BP Location: Right Arm, Patient Position: Sitting)   Pulse 93   Ht 5\' 9"  (1.753 m)   Wt 248 lb (112.5 kg)   SpO2 94%   BMI 36.62 kg/m     Wt Readings from Last 3 Encounters:  02/02/21 248 lb (112.5 kg)  01/07/21 253 lb (114.8 kg)  12/16/20  253 lb (114.8 kg)     GEN:  Well nourished, well developed in no acute distress HEENT: Normal NECK: No JVD; No carotid bruits LYMPHATICS: No lymphadenopathy CARDIAC: RRR, no murmurs, no rubs, no gallops RESPIRATORY:  Clear to auscultation without rales, wheezing or rhonchi  ABDOMEN: Soft, non-tender, non-distended MUSCULOSKELETAL:  No edema; No deformity  SKIN: Warm and dry LOWER EXTREMITIES: no swelling NEUROLOGIC:  Alert and oriented x 3 PSYCHIATRIC:  Normal affect   ASSESSMENT:    1. Atherosclerosis of native coronary artery of native heart without angina pectoris   2. Benign essential hypertension   3. Mixed hyperlipidemia   4. Degenerative disc disease, lumbar    PLAN:    In order of problems listed above:  1. Coronary artery disease status post PTCA and stenting to LAD in 2017.  He is asymptomatic on appropriate medications which include antiplatelet therapy in form of aspirin, he is also on statin I will continue.  I did review stress test with him which was done just few months ago which showed no evidence of ischemia. 2. Essential hypertension blood pressure well controlled continue present medications. 3. Dyslipidemia, I did review his K PN which show me LDL 45 HDL 45 as well it is from October 20, 2020.  He is on Lipitor high intensity statin 40 mg daily which I will continue. 4. Chronic back problem which is basically a problem he is having some therapy for it but was not limited success.  He is not contemplating any surgery.   Medication Adjustments/Labs and Tests Ordered: Current medicines are reviewed at length with the patient today.  Concerns regarding medicines are outlined above.  No orders of the defined types were placed in this encounter.  Medication changes: No orders of the defined types were placed in this encounter.   Signed, October 22, 2020, MD, [facility] 02/02/2021 8:32 AM    [facility]

## 2021-02-04 DIAGNOSIS — E1169 Type 2 diabetes mellitus with other specified complication: Secondary | ICD-10-CM | POA: Diagnosis not present

## 2021-02-04 DIAGNOSIS — Z8639 Personal history of other endocrine, nutritional and metabolic disease: Secondary | ICD-10-CM | POA: Diagnosis not present

## 2021-02-04 DIAGNOSIS — E782 Mixed hyperlipidemia: Secondary | ICD-10-CM | POA: Diagnosis not present

## 2021-02-04 DIAGNOSIS — I10 Essential (primary) hypertension: Secondary | ICD-10-CM | POA: Diagnosis not present

## 2021-02-04 DIAGNOSIS — I2510 Atherosclerotic heart disease of native coronary artery without angina pectoris: Secondary | ICD-10-CM | POA: Diagnosis not present

## 2021-02-09 ENCOUNTER — Other Ambulatory Visit: Payer: Self-pay | Admitting: Family Medicine

## 2021-02-18 NOTE — Progress Notes (Signed)
[name] [address] [place] [address] Phone: [phone] Subjective:   Tim Cook, am serving as a scribe for Dr. Antoine Primas. This visit occurred during the SARS-CoV-2 public health emergency.  Safety protocols were in place, including screening questions prior to the visit, additional usage of staff PPE, and extensive cleaning of exam room while observing appropriate contact time as indicated for disinfecting solutions.   I'm seeing this patient by the request  of:  Tim Cook, [place]  CC: Low back pain follow-up  TFT:[id number]   01/07/2021 Known degenerative disc disease.  Concerned that patient is also potentially having some facet arthritic changes that also could be causing some mild lumbar radiculopathy.  Patient is pain is getting possibly worse at this time.  Patient is even having pain when he is driving in a car.  I feel like at this point we need to consider advanced imaging.  An MRI of the lumbar spine is ordered today for further evaluation follow-up with me again after imaging we will discuss if patient is a candidate for injections, or formal physical therapy or if need for surgical intervention.  Update 02/19/2021 Tim Cook is a 60 y.o. male coming in with complaint of low back pain. Patient states that epidural injection on 4/29 only worked for 10 days. Continued sharp pain that radiates down L leg returned. Patient has been using gabapentin, zanaflex, and Aleve.  Patient states that the pain is significantly severe overall it is no longer just with certain activities.  Seems to be affecting daily activities.  Patient is having a difficult time working secondary to the discomfort. Patient is an MRI of the lumbar spine that was also independently visualized by me again today. MRI of the lumbar spine shows the patient does have severe spinal stenosis at L3-L4 with a 4 mm dorsal midline synovial cyst also noted in the area.   Moderate canal stenosis at L2 and L3 as well as other significant foraminal stenosis of the entire lumbar spine.     Past Medical History:  Diagnosis Date  . Acute lateral meniscal tear 06/09/2016  . Arthritis    "knees, lower back, hands" (05/18/2016)  . Atherosclerotic heart disease of native coronary artery without angina pectoris    05/18/16  Cath severe 90% LAD prox, 99% mid, Circ dominant with 30% OM1, 50% OM2, 30% OM3, 60% distal circ and PL, severe stenosis nondominant RCA.  EF 55%  3.0x 20, 2.5 x 20 mm, 2.25 x 16 mm Promus premier sent to L[facility] Dr. Clifton James   . Bariatric surgery status 05/18/2016  . Benign essential hypertension 01/28/2021  . Bursitis of right shoulder 01/21/2019  . CAD (coronary artery disease), native coronary artery    05/18/16  Cath severe 90% LAD prox, 99% mid, Circ dominant with 30% OM1, 50% OM2, 30% OM3, 60% distal circ and PL, severe stenosis nondominant RCA.  EF 55%  3.0x 20, 2.5 x 20 mm, 2.25 x 16 mm Promus premier sent to [facility] Dr. Clifton James   . Chronic bronchitis (HCC)    "most years" (05/18/2016)  . Chronic lower back pain   . Closed fracture of right distal radius 03/12/2020  . Closed fracture of right proximal humerus 03/12/2020  . Controlled type 2 diabetes with retinopathy (HCC)   . DDD (degenerative disc disease), lumbar   . Degenerative arthritis of right knee 05/09/2017  . Degenerative disc disease, lumbar 07/07/2016  . Dislocation of left middle finger  01/21/2019  . Encounter for immunization 01/28/2021  . Erectile dysfunction 01/28/2021  . Hyperglycemia due to type 2 diabetes mellitus (HCC) 01/28/2021  . Hyperlipidemia   . Hypertensive heart disease without CHF   . 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 type 2 diabetes mellitus (HCC) 01/28/2021  . Mixed hyperlipidemia 05/18/2016  . Nonallopathic lesion of lumbosacral region 07/07/2016  . Nonallopathic lesion of sacral region 07/07/2016  . Nonallopathic lesion of  thoracic region 07/07/2016  . Obesity   . Shoulder joint pain 01/28/2021  . Sleep apnea    "dx'd; never wore mask" (05/18/2016)  . Squamous carcinoma    cut off right upper arm, left shoulder; froze off nose and right side of face  . Type II diabetes mellitus (HCC)   . Unstable angina (HCC)   . Vitamin D deficiency 01/28/2021  . Vomiting    Past Surgical History:  Procedure Laterality Date  . CARDIAC CATHETERIZATION N/A 05/18/2016   Procedure: Left Heart Cath and Coronary Angiography;  Surgeon: Kathleene Hazel, MD;  Location: [facility] INVASIVE CV LAB;  Service: Cardiovascular;  Laterality: N/A;  . CARDIAC CATHETERIZATION N/A 05/18/2016   Procedure: Coronary Stent Intervention;  Surgeon: Kathleene Hazel, MD;  Location: [facility] INVASIVE CV LAB;  Service: Cardiovascular;  Laterality: N/A;  . CARPAL TUNNEL RELEASE Right 03/17/2020   Procedure: CARPAL TUNNEL RELEASE;  Surgeon: Teryl Lucy, MD;  Location: [facility] ORS;  Service: Orthopedics;  Laterality: Right;  . CORONARY ANGIOPLASTY    . FOOT FRACTURE SURGERY Right 1970s  . LAPAROSCOPIC GASTRIC BANDING  07/2007   by Dr. Daphine Deutscher  . ORIF HUMERUS FRACTURE Right 03/17/2020   Procedure: OPEN REDUCTION INTERNAL FIXATION (ORIF) PROXIMAL HUMERUS FRACTURE AND DISTAL RADIUS FRACTURE;  Surgeon: Teryl Lucy, MD;  Location: [facility] ORS;  Service: Orthopedics;  Laterality: Right;  . PILONIDAL CYST EXCISION  ~ 1985   "took 8 out all at once"  . SQUAMOUS CELL CARCINOMA EXCISION     right upper arm, left shoulder   Social History   Socioeconomic History  . Marital status: Married    Spouse name: Not on file  . Number of children: Not on file  . Years of education: Not on file  . Highest education level: Not on file  Occupational History  . Not on file  Tobacco Use  . Smoking status: Former Smoker    Packs/day: 0.50    Years: 12.00    Pack years: 6.00    Types: Cigarettes    Quit date: 12/15/1991    Years since quitting: 29.2  . Smokeless tobacco:  Former Neurosurgeon    Types: Snuff  . Tobacco comment: "quit snuff in ~ 1981"  Vaping Use  . Vaping Use: Never used  Substance and Sexual Activity  . Alcohol use: Yes    Alcohol/week: 7.0 standard drinks    Types: 7 Cans of beer per week    Comment: More social use  . Drug use: No  . Sexual activity: Yes  Other Topics Concern  . Not on file  Social History Narrative  . Not on file   Social Determinants of Health   Financial Resource Strain: Not on file  Food Insecurity: Not on file  Transportation Needs: Not on file  Physical Activity: Not on file  Stress: Not on file  Social Connections: Not on file   No Known Allergies Family History  Problem Relation Age of Onset  . Heart disease Mother   . Heart disease Father   .  Stroke Father   . Heart attack Father   . Heart disease Sister   . Diabetes Mellitus I Sister   . Stroke Brother     Current Outpatient Medications (Endocrine & Metabolic):  Marland Kitchen  FARXIGA 10 MG TABS tablet, Take 10 mg by mouth daily.  .  metFORMIN (GLUCOPHAGE) 500 MG tablet, Take 1,000 mg by mouth 2 (two) times daily with a meal.  .  RYBELSUS 7 MG TABS, Take 14 mg by mouth daily.  Current Outpatient Medications (Cardiovascular):  .  atorvastatin (LIPITOR) 40 MG tablet, Take 40 mg by mouth daily. Marland Kitchen  losartan (COZAAR) 100 MG tablet, Take 100 mg by mouth daily. .  nitroGLYCERIN (NITROSTAT) 0.4 MG SL tablet, PLACE 1 TABLET (0.4 MG TOTAL) UNDER THE TONGUE EVERY 5 (FIVE) MINUTES AS NEEDED FOR CHEST PAIN. (Patient taking differently: Place 0.4 mg under the tongue every 5 (five) minutes as needed for chest pain.)  Current Outpatient Medications (Respiratory):  .  loratadine (CLARITIN) 10 MG tablet, Take 1 tablet by mouth daily.  Current Outpatient Medications (Analgesics):  .  aspirin EC 81 MG tablet, Take 81 mg by mouth daily. Marland Kitchen  HYDROcodone-acetaminophen (NORCO) 10-325 MG tablet, Take 1 tablet by mouth as needed for pain.   Current Outpatient Medications (Other):   .  gabapentin (NEURONTIN) 100 MG capsule, TAKE 2 CAPSULES (200 MG TOTAL) BY MOUTH AT BEDTIME. .  Multiple Vitamin (MULTIVITAMIN) capsule, Take 1 capsule by mouth daily. Unknown strength .  tiZANidine (ZANAFLEX) 2 MG tablet, TAKE 1 TABLET BY MOUTH EVERYDAY AT BEDTIME .  Vitamin D, Ergocalciferol, (DRISDOL) 1.25 MG (50000 UNIT) CAPS capsule, TAKE 1 CAPSULE EVERY 7 DAYS (Patient taking differently: Take 50,000 Units by mouth every 7 (seven) days.)   Reviewed prior external information including notes and imaging from  primary care provider As well as notes that were available from care everywhere and other healthcare systems.  Past medical history, social, surgical and family history all reviewed in electronic medical record.  No pertanent information unless stated regarding to the chief complaint.   Review of Systems:  No headache, visual changes, nausea, vomiting, diarrhea, constipation, dizziness, abdominal pain, skin rash, fevers, chills, night sweats, weight loss, swollen lymph nodes, joint swelling, chest pain, shortness of breath, mood changes. POSITIVE muscle aches, body aches  Objective  Blood pressure 126/80, pulse 93, height 5\' 9"  (1.753 m), weight 247 lb (112 kg), SpO2 98 %.   General: No apparent distress alert and oriented x3 mood and affect normal, dressed appropriately.  HEENT: Pupils equal, extraocular movements intact  Respiratory: Patient's speak in full sentences and does not appear short of breath  Cardiovascular: No lower extremity edema, non tender, no erythema  Gait normal with good balance and coordination.  MSK:  Non tender with full range of motion and good stability and symmetric strength and tone of shoulders, elbows, wrist, hip, knee and ankles bilaterally.  Low back exam does show poor core strength noted.  Loss of lordosis.  Limited range of motion in all planes.  Patient does have tightness with straight leg test and worsening pain with extension of the back  greater than 5 degrees.  Patient was neurovascular intact distally and the strength is good in the lower extremities.   Impression and Recommendations:     The above documentation has been reviewed and is accurate and complete , DO

## 2021-02-19 ENCOUNTER — Other Ambulatory Visit: Payer: Self-pay

## 2021-02-19 ENCOUNTER — Ambulatory Visit: Payer: BC Managed Care – PPO | Admitting: Family Medicine

## 2021-02-19 ENCOUNTER — Encounter: Payer: Self-pay | Admitting: Family Medicine

## 2021-02-19 VITALS — BP 126/80 | HR 93 | Ht 69.0 in | Wt 247.0 lb

## 2021-02-19 DIAGNOSIS — M5136 Other intervertebral disc degeneration, lumbar region: Secondary | ICD-10-CM

## 2021-02-19 DIAGNOSIS — M545 Low back pain, unspecified: Secondary | ICD-10-CM

## 2021-02-19 DIAGNOSIS — G8929 Other chronic pain: Secondary | ICD-10-CM | POA: Diagnosis not present

## 2021-02-19 NOTE — Patient Instructions (Addendum)
Good to see you Continue gabapentin Dr. Elie Confer [facility] I am here if you have questions

## 2021-02-19 NOTE — Assessment & Plan Note (Signed)
Patient has significant degenerative disc disease and severe spinal stenosis mostly at the L3-L4 that also has what appears to be a small midline cyst.  I do believe the patient's symptoms do correspond with this area.  Patient did have 10 days of relief with an epidural.  We discussed the potential for doing another epidural but patient would like more long-term benefit.  Due to the severity of the rest of his back I do think that he could be a surgical candidate and this would help quality of life.  Patient is unable to walk more than half a mile without a significant amount of pain that can stop him from other activities.  Patient is having significant difficulty even doing work at this point.  Encouraged him to continue the gabapentin but will refer him to neurosurgery to discuss potential surgical management.

## 2021-03-02 ENCOUNTER — Encounter: Payer: Self-pay | Admitting: Family Medicine

## 2021-03-05 ENCOUNTER — Other Ambulatory Visit: Payer: Self-pay | Admitting: Family Medicine

## 2021-03-14 ENCOUNTER — Other Ambulatory Visit: Payer: Self-pay | Admitting: Family Medicine

## 2021-03-28 ENCOUNTER — Other Ambulatory Visit: Payer: Self-pay | Admitting: Family Medicine

## 2021-04-07 DIAGNOSIS — M5416 Radiculopathy, lumbar region: Secondary | ICD-10-CM

## 2021-04-07 DIAGNOSIS — M7138 Other bursal cyst, other site: Secondary | ICD-10-CM | POA: Diagnosis not present

## 2021-04-07 DIAGNOSIS — M5442 Lumbago with sciatica, left side: Secondary | ICD-10-CM | POA: Diagnosis not present

## 2021-04-07 DIAGNOSIS — M5441 Lumbago with sciatica, right side: Secondary | ICD-10-CM

## 2021-04-07 DIAGNOSIS — M48061 Spinal stenosis, lumbar region without neurogenic claudication: Secondary | ICD-10-CM

## 2021-04-07 HISTORY — DX: Lumbago with sciatica, right side: M54.41

## 2021-04-07 HISTORY — DX: Spinal stenosis, lumbar region without neurogenic claudication: M48.061

## 2021-04-07 HISTORY — DX: Radiculopathy, lumbar region: M54.16

## 2021-04-14 ENCOUNTER — Telehealth: Payer: Self-pay | Admitting: Family Medicine

## 2021-04-14 NOTE — Telephone Encounter (Signed)
Pt called, states [facility] has been faxing [place] for a Vit D refill. I do not see any notes on this.  Can we refill Vit D to [facility]?

## 2021-04-15 ENCOUNTER — Other Ambulatory Visit: Payer: Self-pay

## 2021-04-15 MED ORDER — VITAMIN D (ERGOCALCIFEROL) 1.25 MG (50000 UNIT) PO CAPS: 50000.0000 [IU] | ORAL_CAPSULE | ORAL | 0 refills | Status: DC

## 2021-04-15 NOTE — Telephone Encounter (Signed)
Sent to mail order

## 2021-04-15 NOTE — Telephone Encounter (Signed)
Pt informed refill sent into mail order.

## 2021-04-16 DIAGNOSIS — Z20822 Contact with and (suspected) exposure to covid-19: Secondary | ICD-10-CM | POA: Diagnosis not present

## 2021-04-28 ENCOUNTER — Other Ambulatory Visit: Payer: Self-pay | Admitting: Family Medicine

## 2021-05-05 DIAGNOSIS — M48062 Spinal stenosis, lumbar region with neurogenic claudication: Secondary | ICD-10-CM | POA: Diagnosis not present

## 2021-05-05 DIAGNOSIS — M48061 Spinal stenosis, lumbar region without neurogenic claudication: Secondary | ICD-10-CM | POA: Diagnosis not present

## 2021-05-05 DIAGNOSIS — M7138 Other bursal cyst, other site: Secondary | ICD-10-CM | POA: Diagnosis not present

## 2021-05-05 DIAGNOSIS — M5136 Other intervertebral disc degeneration, lumbar region: Secondary | ICD-10-CM | POA: Diagnosis not present

## 2021-05-05 DIAGNOSIS — M4726 Other spondylosis with radiculopathy, lumbar region: Secondary | ICD-10-CM | POA: Diagnosis not present

## 2021-05-05 DIAGNOSIS — M5116 Intervertebral disc disorders with radiculopathy, lumbar region: Secondary | ICD-10-CM | POA: Diagnosis not present

## 2021-05-21 ENCOUNTER — Other Ambulatory Visit: Payer: Self-pay | Admitting: Family Medicine

## 2021-05-26 DIAGNOSIS — L814 Other melanin hyperpigmentation: Secondary | ICD-10-CM | POA: Diagnosis not present

## 2021-05-26 DIAGNOSIS — L821 Other seborrheic keratosis: Secondary | ICD-10-CM | POA: Diagnosis not present

## 2021-05-26 DIAGNOSIS — L570 Actinic keratosis: Secondary | ICD-10-CM | POA: Diagnosis not present

## 2021-05-26 DIAGNOSIS — Z85828 Personal history of other malignant neoplasm of skin: Secondary | ICD-10-CM | POA: Diagnosis not present

## 2021-05-26 DIAGNOSIS — D225 Melanocytic nevi of trunk: Secondary | ICD-10-CM | POA: Diagnosis not present

## 2021-06-11 ENCOUNTER — Other Ambulatory Visit: Payer: Self-pay | Admitting: Family Medicine

## 2021-06-13 ENCOUNTER — Other Ambulatory Visit: Payer: Self-pay | Admitting: Family Medicine

## 2021-06-16 ENCOUNTER — Other Ambulatory Visit: Payer: Self-pay | Admitting: Family Medicine

## 2021-06-22 DIAGNOSIS — E113311 Type 2 diabetes mellitus with moderate nonproliferative diabetic retinopathy with macular edema, right eye: Secondary | ICD-10-CM | POA: Diagnosis not present

## 2021-06-22 DIAGNOSIS — E113492 Type 2 diabetes mellitus with severe nonproliferative diabetic retinopathy without macular edema, left eye: Secondary | ICD-10-CM | POA: Diagnosis not present

## 2021-06-22 DIAGNOSIS — H35372 Puckering of macula, left eye: Secondary | ICD-10-CM | POA: Diagnosis not present

## 2021-06-22 DIAGNOSIS — H43823 Vitreomacular adhesion, bilateral: Secondary | ICD-10-CM | POA: Diagnosis not present

## 2021-06-25 DIAGNOSIS — I10 Essential (primary) hypertension: Secondary | ICD-10-CM | POA: Diagnosis not present

## 2021-06-25 DIAGNOSIS — I2510 Atherosclerotic heart disease of native coronary artery without angina pectoris: Secondary | ICD-10-CM | POA: Diagnosis not present

## 2021-06-25 DIAGNOSIS — E1169 Type 2 diabetes mellitus with other specified complication: Secondary | ICD-10-CM | POA: Diagnosis not present

## 2021-06-25 DIAGNOSIS — Z23 Encounter for immunization: Secondary | ICD-10-CM | POA: Diagnosis not present

## 2021-06-25 DIAGNOSIS — E782 Mixed hyperlipidemia: Secondary | ICD-10-CM | POA: Diagnosis not present

## 2021-07-01 ENCOUNTER — Ambulatory Visit: Payer: BC Managed Care – PPO | Admitting: Podiatry

## 2021-07-01 DIAGNOSIS — E782 Mixed hyperlipidemia: Secondary | ICD-10-CM | POA: Diagnosis not present

## 2021-07-01 DIAGNOSIS — Z125 Encounter for screening for malignant neoplasm of prostate: Secondary | ICD-10-CM | POA: Diagnosis not present

## 2021-07-01 DIAGNOSIS — E1169 Type 2 diabetes mellitus with other specified complication: Secondary | ICD-10-CM | POA: Diagnosis not present

## 2021-07-05 ENCOUNTER — Other Ambulatory Visit: Payer: Self-pay

## 2021-07-05 ENCOUNTER — Ambulatory Visit (INDEPENDENT_AMBULATORY_CARE_PROVIDER_SITE_OTHER): Payer: BC Managed Care – PPO

## 2021-07-05 ENCOUNTER — Ambulatory Visit (INDEPENDENT_AMBULATORY_CARE_PROVIDER_SITE_OTHER): Payer: BC Managed Care – PPO | Admitting: Podiatry

## 2021-07-05 DIAGNOSIS — Q66221 Congenital metatarsus adductus, right foot: Secondary | ICD-10-CM | POA: Diagnosis not present

## 2021-07-05 DIAGNOSIS — M722 Plantar fascial fibromatosis: Secondary | ICD-10-CM

## 2021-07-05 DIAGNOSIS — M7750 Other enthesopathy of unspecified foot: Secondary | ICD-10-CM | POA: Diagnosis not present

## 2021-07-05 DIAGNOSIS — M19071 Primary osteoarthritis, right ankle and foot: Secondary | ICD-10-CM

## 2021-07-05 NOTE — Patient Instructions (Signed)

## 2021-07-05 NOTE — Progress Notes (Signed)
  Subjective:  Patient ID: Tim Cook, male    DOB: 1961/04/26,  MRN: [id number]  Chief Complaint  Patient presents with   Foot Pain     NP // Right foot pain    60 y.o. male presents with the above complaint. History confirmed with patient.  Has chronic right foot pain in the midfoot.  He saw Dr. Elijah Birk many years ago and he discussed shaving down bone spurs in the middle the foot.  Describes it as stiffness in the foot and the toes.  Had orthotics previously and these did not help much and he walked towards the outside of the foot.  Also has sharp heel pain on the bottom of the heel that is worse with weightbearing and pressure  Objective:  Physical Exam: warm, good capillary refill, no trophic changes or ulcerative lesions, normal DP and PT pulses, and normal sensory exam. Left Foot: Sharp pain on plantar fascial at the insertion of the medial calcaneus Right Foot: He has metatarsal adductus and palpable dorsal spurring in the midtarsal joint  No images are attached to the encounter.  Radiographs: Multiple views x-ray of both feet: Severe second and third TMT J arthritis with dorsal spurring, plantar heel spur on the left Assessment:   1. Plantar fasciitis   2. Arthritis of midtarsal joint of right foot      Plan:  Patient was evaluated and treated and all questions answered.  Discussed the etiology and treatment options for plantar fasciitis including stretching, formal physical therapy, supportive shoegears such as a running shoe or sneaker, pre fabricated orthoses, injection therapy, and oral medications. We also discussed the role of surgical treatment of this for patients who do not improve after exhausting non-surgical treatment options.   -XR reviewed with patient -Educated patient on stretching and icing of the affected limb -Injection delivered to the plantar fascia of the left foot.  After sterile prep with povidone-iodine solution and alcohol, the left heel was  injected with 0.5cc 2% xylocaine plain, 0.5cc 0.5% marcaine plain, 5mg  triamcinolone acetonide, and 2mg  dexamethasone was injected along the medial plantar fascia at the insertion on the plantar calcaneus. The patient tolerated the procedure well without complication.   Regarding his right foot I reviewed the radiographs with him and his wife.  I discussed how metatarsal adductus leads to chronic foot deformity as well as arthritis of the TMT J's.  We discussed nonsurgical and surgical treatment for this.  Surgical we discussed arthrodesis of the joints.  At this point he would like to hold off on surgical intervention.  I recommended wearing a supportive shoe with a stiff insert such as a carbon fiber plate which I showed him on .  He will try this for now and see how he does with this  Return in about 8 weeks (around 08/30/2021) for recheck plantar fasciitis L, arthritis on R .

## 2021-07-13 ENCOUNTER — Other Ambulatory Visit: Payer: Self-pay | Admitting: Family Medicine

## 2021-08-06 DIAGNOSIS — M48061 Spinal stenosis, lumbar region without neurogenic claudication: Secondary | ICD-10-CM | POA: Diagnosis not present

## 2021-08-06 DIAGNOSIS — M5442 Lumbago with sciatica, left side: Secondary | ICD-10-CM | POA: Diagnosis not present

## 2021-08-06 DIAGNOSIS — M7138 Other bursal cyst, other site: Secondary | ICD-10-CM | POA: Diagnosis not present

## 2021-08-06 DIAGNOSIS — M5441 Lumbago with sciatica, right side: Secondary | ICD-10-CM | POA: Diagnosis not present

## 2021-08-06 IMAGING — XA Imaging study
2 series · 2 of 2 positions shown · non-contrast
Comparison: none

CLINICAL DATA: Lumbar radiculopathy.

[Series 1: ortho standard · 1 of 1 slices shown (1 of 2)]
[im 1/1]
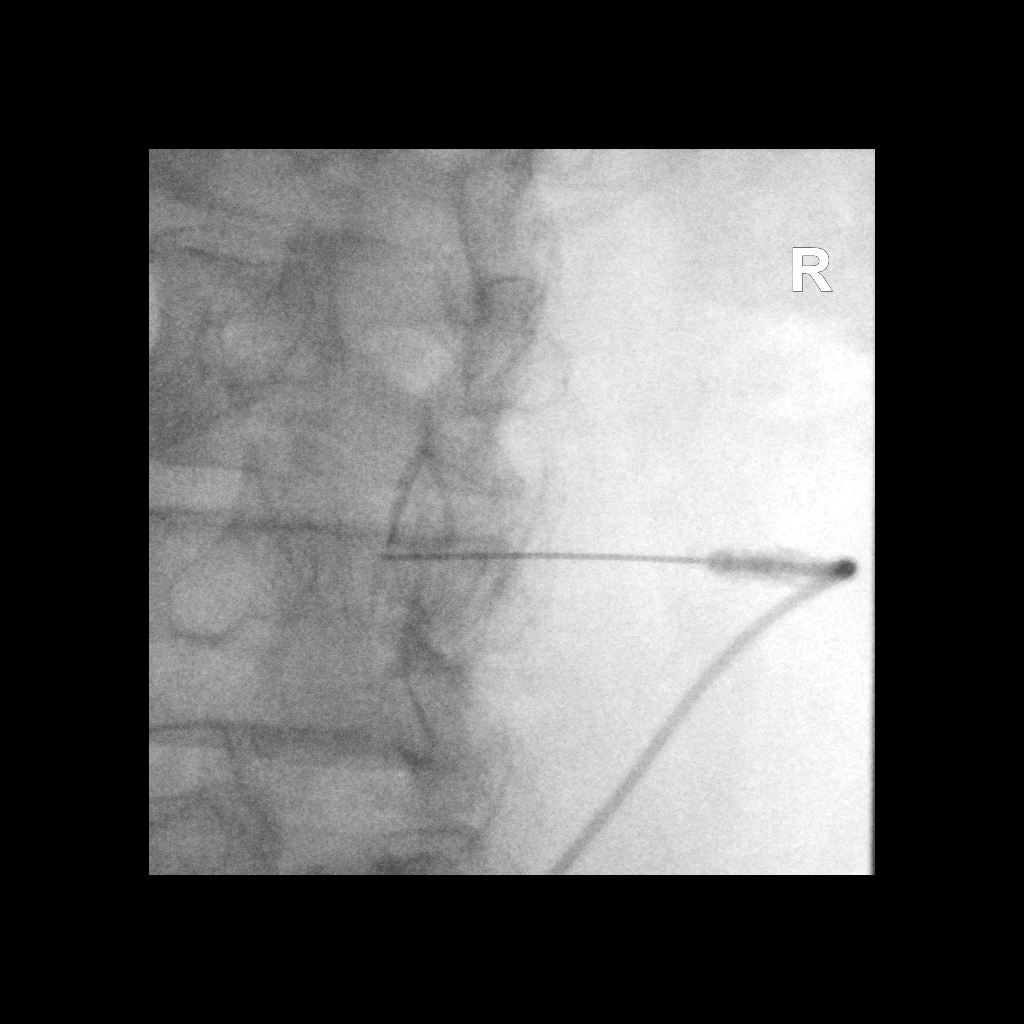

[Series 2: ortho standard · 1 of 1 slices shown (2 of 2)]
[im 1/1]
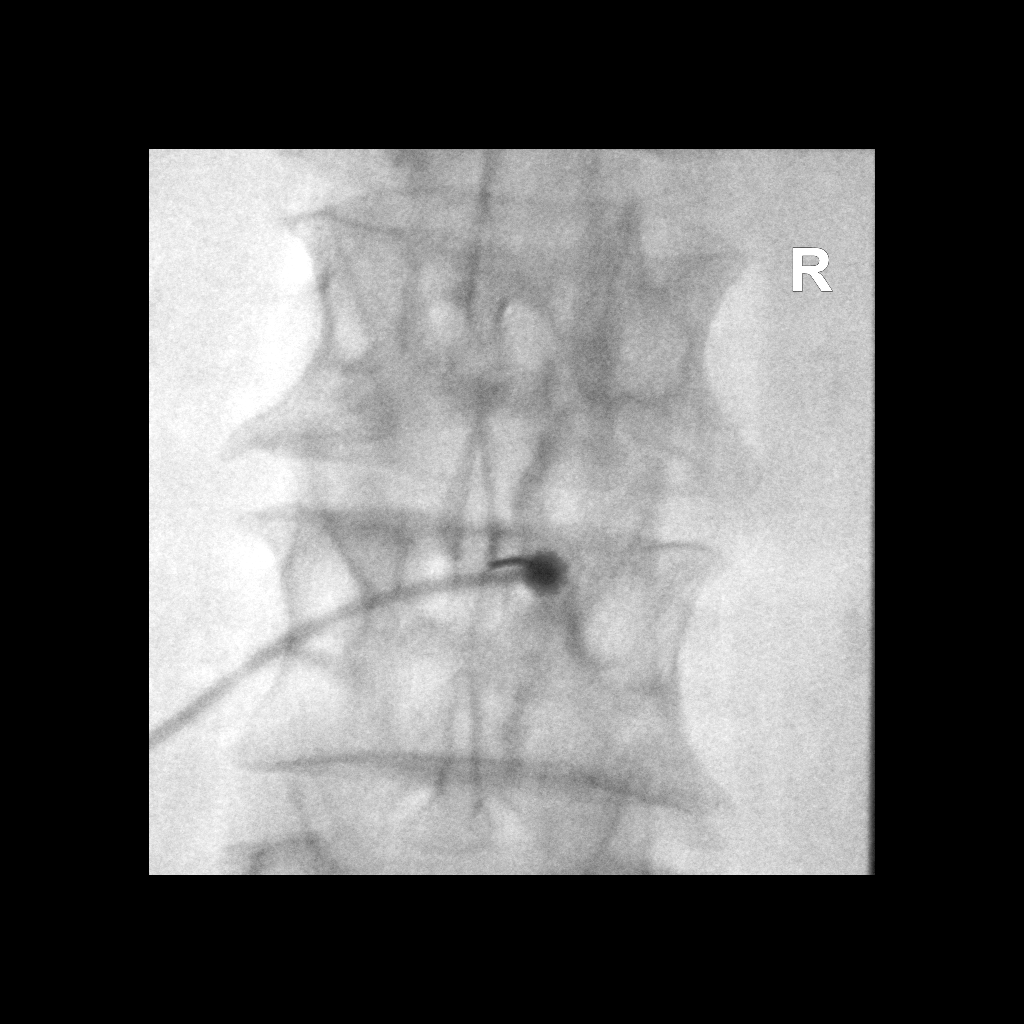

[2 of 2 positions shown; findings below may reference images not displayed]

FLUOROSCOPY TIME:  Radiation Exposure Index (as provided by the
fluoroscopic device): 28.93 uGy*m2

PROCEDURE:
The procedure, risks, benefits, and alternatives were explained to
the patient. Questions regarding the procedure were encouraged and
answered. The patient understands and consents to the procedure.

LUMBAR EPIDURAL INJECTION:

An interlaminar approach was performed on right at L3-4. The
overlying skin was cleansed and anesthetized. A 20 gauge epidural
needle was advanced using loss-of-resistance technique.

DIAGNOSTIC EPIDURAL INJECTION:

Injection of Isovue-M 200 shows a good epidural pattern with spread
above and below the level of needle placement, primarily on the
right no vascular opacification is seen.

THERAPEUTIC EPIDURAL INJECTION:

80 mg of Depo-Medrol mixed with 2 mL 1% lidocaine were instilled.
The procedure was well-tolerated, and the patient was discharged
thirty minutes following the injection in good condition.

COMPLICATIONS:
None
IMPRESSION: Technically successful epidural injection on the right L3-4 # 1

## 2021-08-07 ENCOUNTER — Other Ambulatory Visit: Payer: Self-pay | Admitting: Family Medicine

## 2021-08-13 ENCOUNTER — Other Ambulatory Visit: Payer: Self-pay | Admitting: Family Medicine

## 2021-08-13 ENCOUNTER — Ambulatory Visit
Admission: RE | Admit: 2021-08-13 | Discharge: 2021-08-13 | Disposition: A | Discharge status: Home / Self Care | Payer: BC Managed Care – PPO | Source: Ambulatory Visit | Attending: Family Medicine | Admitting: Family Medicine

## 2021-08-13 DIAGNOSIS — R058 Other specified cough: Secondary | ICD-10-CM | POA: Diagnosis not present

## 2021-08-13 DIAGNOSIS — R059 Cough, unspecified: Secondary | ICD-10-CM | POA: Diagnosis not present

## 2021-08-27 ENCOUNTER — Other Ambulatory Visit: Payer: Self-pay | Admitting: Family Medicine

## 2021-08-30 ENCOUNTER — Ambulatory Visit (INDEPENDENT_AMBULATORY_CARE_PROVIDER_SITE_OTHER): Payer: BC Managed Care – PPO | Admitting: Podiatry

## 2021-08-30 ENCOUNTER — Other Ambulatory Visit: Payer: Self-pay

## 2021-08-30 DIAGNOSIS — M722 Plantar fascial fibromatosis: Secondary | ICD-10-CM

## 2021-08-30 DIAGNOSIS — Q66221 Congenital metatarsus adductus, right foot: Secondary | ICD-10-CM | POA: Diagnosis not present

## 2021-08-30 DIAGNOSIS — M19071 Primary osteoarthritis, right ankle and foot: Secondary | ICD-10-CM

## 2021-08-30 NOTE — Progress Notes (Signed)
  Subjective:  Patient ID: Tim Cook, male    DOB: Mar 04, 1961,  MRN: [id number]  Chief Complaint  Patient presents with   Plantar Fasciitis    8 week follow up    60 y.o. male presents with the above complaint. History confirmed with patient.  Overall has had quite a bit of improvement in the foot with the last injection.  He did begin using the carbon fiber insoles and thinks this may be helping as well  Objective:  Physical Exam: warm, good capillary refill, no trophic changes or ulcerative lesions, normal DP and PT pulses, and normal sensory exam. Left Foot: Today no pain on plantar fascia at the insertion of the medial calcaneus Right Foot: He has metatarsal adductus and palpable dorsal spurring in the midtarsal joint  No images are attached to the encounter.  Radiographs: Multiple views x-ray of both feet: Severe second and third TMT J arthritis with dorsal spurring, plantar heel spur on the left Assessment:   1. Plantar fasciitis   2. Congenital metatarsus adductus, right foot   3. Arthritis of midtarsal joint of right foot      Plan:  Patient was evaluated and treated and all questions answered.  Overall has had quite a bit of improvement after the last injection wearing carbon fiber insoles.  We again discussed symptomatic treatment of this and I think supporting with stiff insoles is likely the best recourse for now.  Reconstruction of the arthritis would be quite extensive and certainly carry significant risk and prolonged recovery.  He will continue with our current plan and return to see me as needed for this or other issues  Return if symptoms worsen or fail to improve.

## 2021-09-04 ENCOUNTER — Other Ambulatory Visit: Payer: Self-pay | Admitting: Family Medicine

## 2021-10-02 ENCOUNTER — Other Ambulatory Visit: Payer: Self-pay | Admitting: Family Medicine

## 2021-10-10 ENCOUNTER — Other Ambulatory Visit: Payer: Self-pay | Admitting: Family Medicine

## 2021-10-21 DIAGNOSIS — E119 Type 2 diabetes mellitus without complications: Secondary | ICD-10-CM | POA: Diagnosis not present

## 2021-10-25 DIAGNOSIS — M545 Low back pain, unspecified: Secondary | ICD-10-CM | POA: Diagnosis not present

## 2021-10-26 ENCOUNTER — Other Ambulatory Visit: Payer: Self-pay

## 2021-10-26 DIAGNOSIS — Z8639 Personal history of other endocrine, nutritional and metabolic disease: Secondary | ICD-10-CM

## 2021-10-26 HISTORY — DX: Personal history of other endocrine, nutritional and metabolic disease: Z86.39

## 2021-10-28 ENCOUNTER — Other Ambulatory Visit: Payer: Self-pay | Admitting: Family Medicine

## 2021-10-29 ENCOUNTER — Encounter: Payer: Self-pay | Admitting: Cardiology

## 2021-10-29 ENCOUNTER — Ambulatory Visit: Payer: BC Managed Care – PPO | Admitting: Cardiology

## 2021-10-29 ENCOUNTER — Other Ambulatory Visit: Payer: Self-pay

## 2021-10-29 VITALS — BP 120/78 | HR 90 | Ht 69.0 in | Wt 255.0 lb

## 2021-10-29 DIAGNOSIS — E782 Mixed hyperlipidemia: Secondary | ICD-10-CM | POA: Diagnosis not present

## 2021-10-29 DIAGNOSIS — I10 Essential (primary) hypertension: Secondary | ICD-10-CM | POA: Diagnosis not present

## 2021-10-29 DIAGNOSIS — I2510 Atherosclerotic heart disease of native coronary artery without angina pectoris: Secondary | ICD-10-CM

## 2021-10-29 NOTE — Patient Instructions (Signed)
Medication Instructions:  Your physician recommends that you continue on your current medications as directed. Please refer to the Current Medication list given to you today.  *If you need a refill on your cardiac medications before your next appointment, please call your pharmacy*   Lab Work: Your physician recommends that you return for lab work in: Labs in 3 months: Lipid If you have labs (blood work) drawn today and your tests are completely normal, you will receive your results only by: MyChart Message (if you have MyChart) OR A paper copy in the mail If you have any lab test that is abnormal or we need to change your treatment, we will call you to review the results.   Testing/Procedures: None   Follow-Up: At [facility], you and your health needs are our priority.  As part of our continuing mission to provide you with exceptional heart care, we have created designated Provider Care Teams.  These Care Teams include your primary Cardiologist (physician) and Advanced Practice Providers (APPs -  Physician Assistants and Nurse Practitioners) who all work together to provide you with the care you need, when you need it.  We recommend signing up for the patient portal called "MyChart".  Sign up information is provided on this After Visit Summary.  MyChart is used to connect with patients for Virtual Visits (Telemedicine).  Patients are able to view lab/test results, encounter notes, upcoming appointments, etc.  Non-urgent messages can be sent to your provider as well.   To learn more about what you can do with MyChart, go to [email or web address].    Your next appointment:   6 month(s)  The format for your next appointment:   In Person  Provider:   Gypsy Balsam, MD    Other Instructions None

## 2021-10-29 NOTE — Addendum Note (Signed)
Addended by: Roxanne Mins I on: 10/29/2021 12:08 PM   Modules accepted: Orders

## 2021-10-29 NOTE — Progress Notes (Signed)
Cardiology Office Note:    Date:  10/29/2021   ID:  Tim Cook, DOB Feb 17, 1961, MRN [id number]  PCP:  Wilfrid Lund, PA  Cardiologist:  Gypsy Balsam, MD    Referring MD: Wilfrid Lund, [place]   Chief Complaint  Patient presents with   Follow-up  Doing well, denies have any chest pain tightness squeezing pressure burning chest  History of Present Illness:    Tim Cook is a 61 y.o. male   with past medical history significant for coronary artery disease.  In 2017 he was noted to have 90% proximal LAD and 90% mid LAD that required stenting.  His past medical history also significant for essential hypertension, diabetes, obesity, sleep apnea, dyslipidemia. Comes today to my office for follow-up overall doing very well.  Denies have any chest pain tightness squeezing pressure burning chest.  Still trying to be active but he does not like cold weather therefore activity is somewhat limited.  Past Medical History:  Diagnosis Date   Acute lateral meniscal tear 06/09/2016   Arthritis    "knees, lower back, hands" (05/18/2016)   Atherosclerotic heart disease of native coronary artery without angina pectoris    05/18/16  Cath severe 90% LAD prox, 99% mid, Circ dominant with 30% OM1, 50% OM2, 30% OM3, 60% distal circ and PL, severe stenosis nondominant RCA.  EF 55%  3.0x 20, 2.5 x 20 mm, 2.25 x 16 mm Promus premier sent to LAD Dr. Clifton James    Bariatric surgery status 05/18/2016   Benign essential hypertension 01/28/2021   Bursitis of right shoulder 01/21/2019   CAD (coronary artery disease), native coronary artery    05/18/16  Cath severe 90% LAD prox, 99% mid, Circ dominant with 30% OM1, 50% OM2, 30% OM3, 60% distal circ and PL, severe stenosis nondominant RCA.  EF 55%  3.0x 20, 2.5 x 20 mm, 2.25 x 16 mm Promus premier sent to LAD Dr. Clifton James    Chronic bronchitis [facility])    "most years" (05/18/2016)   Chronic lower back pain    Closed fracture of right distal radius 03/12/2020   Closed  fracture of right proximal humerus 03/12/2020   Controlled type 2 diabetes with retinopathy (HCC)    DDD (degenerative disc disease), lumbar    Degenerative arthritis of right knee 05/09/2017   Degenerative disc disease, lumbar 07/07/2016   Degenerative lumbar spinal stenosis 04/07/2021   Dislocation of left middle finger 01/21/2019   Encounter for immunization 01/28/2021   Erectile dysfunction 01/28/2021   History of nutritional deficiency 10/26/2021   Hyperglycemia due to type 2 diabetes mellitus (HCC) 01/28/2021   Hyperlipidemia    Hypertensive heart disease without CHF    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 type 2 diabetes mellitus (HCC) 01/28/2021   Mixed hyperlipidemia 05/18/2016   Nonallopathic lesion of lumbosacral region 07/07/2016   Nonallopathic lesion of sacral region 07/07/2016   Nonallopathic lesion of thoracic region 07/07/2016   Obesity    Shoulder joint pain 01/28/2021   Sleep apnea    "dx'd; never wore mask" (05/18/2016)   Squamous carcinoma    cut off right upper arm, left shoulder; froze off nose and right side of face   Type II diabetes mellitus (HCC)    Unstable angina (HCC)    Vitamin D deficiency 01/28/2021   Vomiting     Past Surgical History:  Procedure Laterality Date   CARDIAC CATHETERIZATION N/A 05/18/2016   Procedure: Left Heart Cath and Coronary Angiography;  Surgeon: Kathleene Hazelhristopher D McAlhany, MD;  [facility];  Service: Cardiovascular;  Laterality: N/A;   CARDIAC CATHETERIZATION N/A 05/18/2016   Procedure: Coronary Stent Intervention;  Surgeon: Kathleene Hazelhristopher D McAlhany, MD;  [facility];  Service: Cardiovascular;  Laterality: N/A;   CARPAL TUNNEL RELEASE Right 03/17/2020   Procedure: CARPAL TUNNEL RELEASE;  Surgeon: Teryl LucyLandau, Joshua, MD;  Location: [facility] ORS;  Service: Orthopedics;  Laterality: Right;   CORONARY ANGIOPLASTY     FOOT FRACTURE SURGERY Right 1970s   LAPAROSCOPIC GASTRIC BANDING   07/2007   by Dr. Daphine DeutscherMartin   ORIF HUMERUS FRACTURE Right 03/17/2020   Procedure: OPEN REDUCTION INTERNAL FIXATION (ORIF) PROXIMAL HUMERUS FRACTURE AND DISTAL RADIUS FRACTURE;  Surgeon: Teryl LucyLandau, Joshua, MD;  Location: [facility] ORS;  Service: Orthopedics;  Laterality: Right;   PILONIDAL CYST EXCISION  ~ 1985   "took 8 out all at once"   SQUAMOUS CELL CARCINOMA EXCISION     right upper arm, left shoulder    Current Medications: Current Meds  Medication Sig   Aspirin (VAZALORE) 81 MG CAPS Take 1 tablet by mouth daily.   aspirin EC 81 MG tablet Take 81 mg by mouth daily.   atorvastatin (LIPITOR) 40 MG tablet Take 40 mg by mouth daily.   FARXIGA 10 MG TABS tablet Take 10 mg by mouth daily.    gabapentin (NEURONTIN) 100 MG capsule TAKE 2 CAPSULES BY MOUTH AT BEDTIME (Patient taking differently: Take 100 mg by mouth as needed (back pain).)   HYDROcodone-acetaminophen (NORCO) 10-325 MG tablet Take 1 tablet by mouth as needed for pain.   loratadine (CLARITIN) 10 MG tablet Take 1 tablet by mouth daily.   loratadine (CLARITIN) 10 MG tablet Take 10 mg by mouth daily.   losartan (COZAAR) 100 MG tablet Take 100 mg by mouth daily.   metFORMIN (GLUCOPHAGE) 500 MG tablet Take 1,000 mg by mouth 2 (two) times daily with a meal.   Multiple Vitamin (MULTIVITAMIN) capsule Take 1 capsule by mouth daily. Unknown strength   naproxen sodium (ALEVE) 220 MG tablet Take 220 mg by mouth daily as needed (pain).   nitroGLYCERIN (NITROSTAT) 0.4 MG SL tablet PLACE 1 TABLET (0.4 MG TOTAL) UNDER THE TONGUE EVERY 5 (FIVE) MINUTES AS NEEDED FOR CHEST PAIN. (Patient taking differently: Place 0.4 mg under the tongue every 5 (five) minutes as needed for chest pain.)   RYBELSUS 7 MG TABS Take 14 mg by mouth daily.   tiZANidine (ZANAFLEX) 2 MG tablet TAKE 1 TABLET BY MOUTH EVERYDAY AT BEDTIME (Patient taking differently: Take 2 mg by mouth as needed for muscle spasms.)   Vitamin D, Ergocalciferol, (DRISDOL) 1.25 MG (50000 UNIT) CAPS capsule  TAKE 1 CAPSULE EVERY 7 DAYS (Patient taking differently: Take 50,000 Units by mouth every 7 (seven) days.)     Allergies:   Patient has no known allergies.   Social History   Socioeconomic History   Marital status: Married    Spouse name: Not on file   Number of children: Not on file   Years of education: Not on file   Highest education level: Not on file  Occupational History   Not on file  Tobacco Use   Smoking status: Former    Packs/day: 0.50    Years: 12.00    Pack years: 6.00    Types: Cigarettes    Quit date: 12/15/1991    Years since quitting: 29.8   Smokeless tobacco: Former    Types: Snuff   Tobacco comments:    "  quit snuff in ~ 1981"  Vaping Use   Vaping Use: Never used  Substance and Sexual Activity   Alcohol use: Yes    Alcohol/week: 7.0 standard drinks    Types: 7 Cans of beer per week    Comment: More social use   Drug use: No   Sexual activity: Yes  Other Topics Concern   Not on file  Social History Narrative   Not on file   Social Determinants of Health   Financial Resource Strain: Not on file  Food Insecurity: Not on file  Transportation Needs: Not on file  Physical Activity: Not on file  Stress: Not on file  Social Connections: Not on file     Family History: The patient's family history includes Diabetes Mellitus I in his sister; Heart attack in his father; Heart disease in his father, mother, and sister; Stroke in his brother and father. ROS:   Please see the history of present illness.    All 14 point review of systems negative except as described per history of present illness  EKGs/Labs/Other Studies Reviewed:      Recent Labs: No results found for requested labs within last 8760 hours.  Recent Lipid Panel    Component Value Date/Time   CHOL 125 12/07/2018 1520   TRIG 103 12/07/2018 1520   HDL 57 12/07/2018 1520   CHOLHDL 2.2 12/07/2018 1520   LDLCALC 47 12/07/2018 1520    Physical Exam:    VS:  BP 120/78 (BP Location:  Right Arm, Patient Position: Sitting)    Pulse 90    Ht 5\' 9"  (1.753 m)    Wt 255 lb (115.7 kg)    SpO2 96%    BMI 37.66 kg/m     Wt Readings from Last 3 Encounters:  10/29/21 255 lb (115.7 kg)  02/19/21 247 lb (112 kg)  02/02/21 248 lb (112.5 kg)     GEN:  Well nourished, well developed in no acute distress HEENT: Normal NECK: No JVD; No carotid bruits LYMPHATICS: No lymphadenopathy CARDIAC: RRR, no murmurs, no rubs, no gallops RESPIRATORY:  Clear to auscultation without rales, wheezing or rhonchi  ABDOMEN: Soft, non-tender, non-distended MUSCULOSKELETAL:  No edema; No deformity  SKIN: Warm and dry LOWER EXTREMITIES: no swelling NEUROLOGIC:  Alert and oriented x 3 PSYCHIATRIC:  Normal affect   ASSESSMENT:    1. Atherosclerosis of native coronary artery of native heart without angina pectoris   2. Benign essential hypertension   3. Mixed hyperlipidemia    PLAN:    In order of problems listed above:  Coronary disease stable from that point review denies have any chest pain tightness squeezing pressure burning chest on appropriate medications which I will continue. Dyslipidemia I did review his laboratory data done by primary care physician his LDL is 56 we will continue present management which include high intense statin form of Lipitor 40 Benign essential hypertension blood pressure perfectly controlled continue present medications.   Medication Adjustments/Labs and Tests Ordered: Current medicines are reviewed at length with the patient today.  Concerns regarding medicines are outlined above.  No orders of the defined types were placed in this encounter.  Medication changes: No orders of the defined types were placed in this encounter.   Signed, 04/04/21, MD, [facility] 10/29/2021 11:50 AM    [facility]

## 2021-11-02 ENCOUNTER — Other Ambulatory Visit: Payer: Self-pay | Admitting: Family Medicine

## 2021-11-02 NOTE — Progress Notes (Signed)
[name] [address] [place] [address] Phone: [phone] Subjective:   Tim Cook, am serving as a scribe for Dr. Antoine Primas.  This visit occurred during the SARS-CoV-2 public health emergency.  Safety protocols were in place, including screening questions prior to the visit, additional usage of staff PPE, and extensive cleaning of exam room while observing appropriate contact time as indicated for disinfecting solutions.  I'm seeing this patient by the request  of:  Wilfrid Lund, PA  CC: Back pain follow-up  X[id number]  02/19/2021 Patient has significant degenerative disc disease and severe spinal stenosis mostly at the L3-L4 that also has what appears to be a small midline cyst.  I do believe the patient's symptoms do correspond with this area.  Patient did have 10 days of relief with an epidural.  We discussed the potential for doing another epidural but patient would like more long-term benefit.  Due to the severity of the rest of his back I do think that he could be a surgical candidate and this would help quality of life.  Patient is unable to walk more than half a mile without a significant amount of pain that can stop him from other activities.  Patient is having significant difficulty even doing work at this point.  Encouraged him to continue the gabapentin but will refer him to neurosurgery to discuss potential surgical management.  Update 11/04/2021 Tim Cook is a 61 y.o. male coming in with complaint of back pain. Patient states that he had cyst removed off of his spinal cord in August. Pain in legs resolved immediately. Pain occurring now due to arthritis. Pain in middle of lower back. Standing, sitting and walking increases his pain. Using Tylenol, gabapentin and trazodone. Not working due to pain.       Past Medical History:  Diagnosis Date   Acute lateral meniscal tear 06/09/2016   Arthritis    "knees, lower back,  hands" (05/18/2016)   Atherosclerotic heart disease of native coronary artery without angina pectoris    05/18/16  Cath severe 90% LAD prox, 99% mid, Circ dominant with 30% OM1, 50% OM2, 30% OM3, 60% distal circ and PL, severe stenosis nondominant RCA.  EF 55%  3.0x 20, 2.5 x 20 mm, 2.25 x 16 mm Promus premier sent to LAD Dr. Clifton James    Bariatric surgery status 05/18/2016   Benign essential hypertension 01/28/2021   Bursitis of right shoulder 01/21/2019   CAD (coronary artery disease), native coronary artery    05/18/16  Cath severe 90% LAD prox, 99% mid, Circ dominant with 30% OM1, 50% OM2, 30% OM3, 60% distal circ and PL, severe stenosis nondominant RCA.  EF 55%  3.0x 20, 2.5 x 20 mm, 2.25 x 16 mm Promus premier sent to LAD Dr. Clifton James    Chronic bronchitis [facility])    "most years" (05/18/2016)   Chronic lower back pain    Closed fracture of right distal radius 03/12/2020   Closed fracture of right proximal humerus 03/12/2020   Controlled type 2 diabetes with retinopathy (HCC)    DDD (degenerative disc disease), lumbar    Degenerative arthritis of right knee 05/09/2017   Degenerative disc disease, lumbar 07/07/2016   Degenerative lumbar spinal stenosis 04/07/2021   Dislocation of left middle finger 01/21/2019   Encounter for immunization 01/28/2021   Erectile dysfunction 01/28/2021   History of nutritional deficiency 10/26/2021   Hyperglycemia due to type 2 diabetes mellitus (HCC) 01/28/2021  Hyperlipidemia    Hypertensive heart disease without CHF    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 type 2 diabetes mellitus (HCC) 01/28/2021   Mixed hyperlipidemia 05/18/2016   Nonallopathic lesion of lumbosacral region 07/07/2016   Nonallopathic lesion of sacral region 07/07/2016   Nonallopathic lesion of thoracic region 07/07/2016   Obesity    Shoulder joint pain 01/28/2021   Sleep apnea    "dx'd; never wore mask" (05/18/2016)   Squamous carcinoma    cut off right upper  arm, left shoulder; froze off nose and right side of face   Type II diabetes mellitus (HCC)    Unstable angina (HCC)    Vitamin D deficiency 01/28/2021   Vomiting    Past Surgical History:  Procedure Laterality Date   CARDIAC CATHETERIZATION N/A 05/18/2016   Procedure: Left Heart Cath and Coronary Angiography;  Surgeon: Kathleene Hazel, MD;  Location: [facility] INVASIVE CV LAB;  Service: Cardiovascular;  Laterality: N/A;   CARDIAC CATHETERIZATION N/A 05/18/2016   Procedure: Coronary Stent Intervention;  Surgeon: Kathleene Hazel, MD;  Location: [facility] INVASIVE CV LAB;  Service: Cardiovascular;  Laterality: N/A;   CARPAL TUNNEL RELEASE Right 03/17/2020   Procedure: CARPAL TUNNEL RELEASE;  Surgeon: Teryl Lucy, MD;  Location: [facility] ORS;  Service: Orthopedics;  Laterality: Right;   CORONARY ANGIOPLASTY     FOOT FRACTURE SURGERY Right 1970s   LAPAROSCOPIC GASTRIC BANDING  07/2007   by Dr. Daphine Deutscher   ORIF HUMERUS FRACTURE Right 03/17/2020   Procedure: OPEN REDUCTION INTERNAL FIXATION (ORIF) PROXIMAL HUMERUS FRACTURE AND DISTAL RADIUS FRACTURE;  Surgeon: Teryl Lucy, MD;  Location: [facility] ORS;  Service: Orthopedics;  Laterality: Right;   PILONIDAL CYST EXCISION  ~ 1985   "took 8 out all at once"   SQUAMOUS CELL CARCINOMA EXCISION     right upper arm, left shoulder   Social History   Socioeconomic History   Marital status: Married    Spouse name: Not on file   Number of children: Not on file   Years of education: Not on file   Highest education level: Not on file  Occupational History   Not on file  Tobacco Use   Smoking status: Former    Packs/day: 0.50    Years: 12.00    Pack years: 6.00    Types: Cigarettes    Quit date: 12/15/1991    Years since quitting: 29.9   Smokeless tobacco: Former    Types: Snuff   Tobacco comments:    "quit snuff in ~ 1981"  Vaping Use   Vaping Use: Never used  Substance and Sexual Activity   Alcohol use: Yes    Alcohol/week: 7.0 standard drinks     Types: 7 Cans of beer per week    Comment: More social use   Drug use: No   Sexual activity: Yes  Other Topics Concern   Not on file  Social History Narrative   Not on file   Social Determinants of Health   Financial Resource Strain: Not on file  Food Insecurity: Not on file  Transportation Needs: Not on file  Physical Activity: Not on file  Stress: Not on file  Social Connections: Not on file   No Known Allergies Family History  Problem Relation Age of Onset   Heart disease Mother    Heart disease Father    Stroke Father    Heart attack Father    Heart disease Sister    Diabetes Mellitus I Sister  Stroke Brother     Current Outpatient Medications (Endocrine & Metabolic):    FARXIGA 10 MG TABS tablet, Take 10 mg by mouth daily.    metFORMIN (GLUCOPHAGE) 500 MG tablet, Take 1,000 mg by mouth 2 (two) times daily with a meal.   RYBELSUS 7 MG TABS, Take 14 mg by mouth daily.  Current Outpatient Medications (Cardiovascular):    atorvastatin (LIPITOR) 40 MG tablet, Take 40 mg by mouth daily.   losartan (COZAAR) 100 MG tablet, Take 100 mg by mouth daily.   nitroGLYCERIN (NITROSTAT) 0.4 MG SL tablet, PLACE 1 TABLET (0.4 MG TOTAL) UNDER THE TONGUE EVERY 5 (FIVE) MINUTES AS NEEDED FOR CHEST PAIN. (Patient taking differently: Place 0.4 mg under the tongue every 5 (five) minutes as needed for chest pain.)  Current Outpatient Medications (Respiratory):    loratadine (CLARITIN) 10 MG tablet, Take 1 tablet by mouth daily.   loratadine (CLARITIN) 10 MG tablet, Take 10 mg by mouth daily.  Current Outpatient Medications (Analgesics):    Aspirin (VAZALORE) 81 MG CAPS, Take 1 tablet by mouth daily.   aspirin EC 81 MG tablet, Take 81 mg by mouth daily.   HYDROcodone-acetaminophen (NORCO) 10-325 MG tablet, Take 1 tablet by mouth as needed for pain.   naproxen sodium (ALEVE) 220 MG tablet, Take 220 mg by mouth daily as needed (pain).   Current Outpatient Medications (Other):     gabapentin (NEURONTIN) 100 MG capsule, TAKE 2 CAPSULES BY MOUTH AT BEDTIME (Patient taking differently: Take 100 mg by mouth as needed (back pain).)   Multiple Vitamin (MULTIVITAMIN) capsule, Take 1 capsule by mouth daily. Unknown strength   tiZANidine (ZANAFLEX) 2 MG tablet, TAKE 1 TABLET BY MOUTH EVERYDAY AT BEDTIME (Patient taking differently: Take 2 mg by mouth as needed for muscle spasms.)   Vitamin D, Ergocalciferol, (DRISDOL) 1.25 MG (50000 UNIT) CAPS capsule, TAKE 1 CAPSULE EVERY 7 DAYS   Reviewed prior external information including notes and imaging from  primary care provider As well as notes that were available from care everywhere and other healthcare systems.  Past medical history, social, surgical and family history all reviewed in electronic medical record.  No pertanent information unless stated regarding to the chief complaint.   Review of Systems:  No headache, visual changes, nausea, vomiting, diarrhea, constipation, dizziness, abdominal pain, skin rash, fevers, chills, night sweats, weight loss, swollen lymph nodes, body aches, joint swelling, chest pain, shortness of breath, mood changes. POSITIVE muscle aches  Objective  Blood pressure 104/72, pulse 95, height 5\' 9"  (1.753 m), weight 253 lb (114.8 kg), SpO2 97 %.   General: No apparent distress alert and oriented x3 mood and affect normal, dressed appropriately.  HEENT: Pupils equal, extraocular movements intact  Respiratory: Patient's speak in full sentences and does not appear short of breath  Cardiovascular: No lower extremity edema, non tender, no erythema  Gait normal with good balance and coordination.  MSK:   Low back exam Loss lordosis. Poor core strength noted.  Weakness with hip abductor right greater than left.  Tightness noted with FABER right greater than left. Worsening pain with extension of the back.  Osteopathic findings  C5 flexed rotated and side bent left T3 extended rotated and side bent  right inhaled third rib T5 extended rotated and side bent left L1 flexed rotated and side bent right Sacrum right on right     Impression and Recommendations:     The above documentation has been reviewed and is accurate and complete  Tamala Julian, DO

## 2021-11-04 ENCOUNTER — Other Ambulatory Visit: Payer: Self-pay

## 2021-11-04 ENCOUNTER — Ambulatory Visit (INDEPENDENT_AMBULATORY_CARE_PROVIDER_SITE_OTHER): Payer: BC Managed Care – PPO | Admitting: Family Medicine

## 2021-11-04 VITALS — BP 104/72 | HR 95 | Ht 69.0 in | Wt 253.0 lb

## 2021-11-04 DIAGNOSIS — M9908 Segmental and somatic dysfunction of rib cage: Secondary | ICD-10-CM | POA: Diagnosis not present

## 2021-11-04 DIAGNOSIS — M9902 Segmental and somatic dysfunction of thoracic region: Secondary | ICD-10-CM

## 2021-11-04 DIAGNOSIS — M9903 Segmental and somatic dysfunction of lumbar region: Secondary | ICD-10-CM | POA: Diagnosis not present

## 2021-11-04 DIAGNOSIS — M545 Low back pain, unspecified: Secondary | ICD-10-CM | POA: Diagnosis not present

## 2021-11-04 DIAGNOSIS — G8929 Other chronic pain: Secondary | ICD-10-CM

## 2021-11-04 DIAGNOSIS — M9904 Segmental and somatic dysfunction of sacral region: Secondary | ICD-10-CM

## 2021-11-04 DIAGNOSIS — M9901 Segmental and somatic dysfunction of cervical region: Secondary | ICD-10-CM

## 2021-11-04 NOTE — Assessment & Plan Note (Signed)
Chronic low back pain with patient having a history of spinal stenosis and significant facet arthropathy.  Patient did have the removal of the cyst which did help with the radicular symptoms but unfortunately continues to have low back pain.  Patient is now on disability secondary to the pain.  We discussed with patient about the possibility of medial branch blocks as another possible intervention.And the Zanaflex.  Follow-up with me again in 5 to 6 weeks patient though wanted to try osteopathic manipulation with him having improvement previously.  We will see how patient responds.  Discussed the gabapentin

## 2021-11-04 NOTE — Patient Instructions (Signed)
Good to see you Tried OMT PT is a good idea Continue gabapentin and mm relaxer See me in 5-6 weeks

## 2021-11-08 DIAGNOSIS — M47816 Spondylosis without myelopathy or radiculopathy, lumbar region: Secondary | ICD-10-CM | POA: Diagnosis not present

## 2021-11-08 DIAGNOSIS — S335XXD Sprain of ligaments of lumbar spine, subsequent encounter: Secondary | ICD-10-CM | POA: Diagnosis not present

## 2021-11-11 DIAGNOSIS — S335XXD Sprain of ligaments of lumbar spine, subsequent encounter: Secondary | ICD-10-CM | POA: Diagnosis not present

## 2021-11-11 DIAGNOSIS — M47816 Spondylosis without myelopathy or radiculopathy, lumbar region: Secondary | ICD-10-CM | POA: Diagnosis not present

## 2021-11-19 DIAGNOSIS — S335XXD Sprain of ligaments of lumbar spine, subsequent encounter: Secondary | ICD-10-CM | POA: Diagnosis not present

## 2021-11-19 DIAGNOSIS — M47816 Spondylosis without myelopathy or radiculopathy, lumbar region: Secondary | ICD-10-CM | POA: Diagnosis not present

## 2021-11-22 DIAGNOSIS — M47816 Spondylosis without myelopathy or radiculopathy, lumbar region: Secondary | ICD-10-CM | POA: Diagnosis not present

## 2021-11-22 DIAGNOSIS — S335XXD Sprain of ligaments of lumbar spine, subsequent encounter: Secondary | ICD-10-CM | POA: Diagnosis not present

## 2021-11-23 ENCOUNTER — Other Ambulatory Visit: Payer: Self-pay | Admitting: Family Medicine

## 2021-11-26 DIAGNOSIS — K5730 Diverticulosis of large intestine without perforation or abscess without bleeding: Secondary | ICD-10-CM | POA: Diagnosis not present

## 2021-11-26 DIAGNOSIS — D125 Benign neoplasm of sigmoid colon: Secondary | ICD-10-CM | POA: Diagnosis not present

## 2021-11-26 DIAGNOSIS — K649 Unspecified hemorrhoids: Secondary | ICD-10-CM | POA: Diagnosis not present

## 2021-11-26 DIAGNOSIS — D123 Benign neoplasm of transverse colon: Secondary | ICD-10-CM | POA: Diagnosis not present

## 2021-11-26 DIAGNOSIS — Z86010 Personal history of colonic polyps: Secondary | ICD-10-CM | POA: Diagnosis not present

## 2021-11-26 DIAGNOSIS — D120 Benign neoplasm of cecum: Secondary | ICD-10-CM | POA: Diagnosis not present

## 2021-11-29 DIAGNOSIS — S335XXD Sprain of ligaments of lumbar spine, subsequent encounter: Secondary | ICD-10-CM | POA: Diagnosis not present

## 2021-11-29 DIAGNOSIS — M47816 Spondylosis without myelopathy or radiculopathy, lumbar region: Secondary | ICD-10-CM | POA: Diagnosis not present

## 2021-12-01 DIAGNOSIS — R509 Fever, unspecified: Secondary | ICD-10-CM | POA: Diagnosis not present

## 2021-12-01 DIAGNOSIS — U071 COVID-19: Secondary | ICD-10-CM | POA: Diagnosis not present

## 2021-12-09 ENCOUNTER — Other Ambulatory Visit: Payer: Self-pay | Admitting: Family Medicine

## 2021-12-13 ENCOUNTER — Other Ambulatory Visit: Payer: Self-pay | Admitting: Family Medicine

## 2021-12-15 NOTE — Progress Notes (Deleted)
?Terrilee Files D.O. ?[facility] ?[address] [place] [address] ?Phone: [phone] ?Subjective:   ? ?I'm seeing this patient by the request  of:  Wilfrid Lund, PA ? ?CC:  ? ?[id number]  ?Tim Cook is a 61 y.o. male coming in with complaint of back and neck pain. OMT 11/04/2021. Patient states  ? ?Medications patient has been prescribed: Vit D, Gabapentin ? ?Taking: ? ? ?  ? ? ? ? ?Reviewed prior external information including notes and imaging from previsou exam, outside providers and external EMR if available.  ? ?As well as notes that were available from care everywhere and other healthcare systems. ? ?Past medical history, social, surgical and family history all reviewed in electronic medical record.  No pertanent information unless stated regarding to the chief complaint.  ? ?Past Medical History:  ?Diagnosis Date  ? Acute lateral meniscal tear 06/09/2016  ? Arthritis   ? "knees, lower back, hands" (05/18/2016)  ? Atherosclerotic heart disease of native coronary artery without angina pectoris   ? 05/18/16  Cath severe 90% LAD prox, 99% mid, Circ dominant with 30% OM1, 50% OM2, 30% OM3, 60% distal circ and PL, severe stenosis nondominant RCA.  EF 55%  3.0x 20, 2.5 x 20 mm, 2.25 x 16 mm Promus premier sent to LAD Dr. Clifton James   ? Bariatric surgery status 05/18/2016  ? Benign essential hypertension 01/28/2021  ? Bursitis of right shoulder 01/21/2019  ? CAD (coronary artery disease), native coronary artery   ? 05/18/16  Cath severe 90% LAD prox, 99% mid, Circ dominant with 30% OM1, 50% OM2, 30% OM3, 60% distal circ and PL, severe stenosis nondominant RCA.  EF 55%  3.0x 20, 2.5 x 20 mm, 2.25 x 16 mm Promus premier sent to L[facility] Dr. Clifton James   ? Chronic bronchitis (HCC)   ? "most years" (05/18/2016)  ? Chronic lower back pain   ? Closed fracture of right distal radius 03/12/2020  ? Closed fracture of right proximal humerus 03/12/2020  ? Controlled type 2 diabetes with retinopathy (HCC)   ? DDD  (degenerative disc disease), lumbar   ? Degenerative arthritis of right knee 05/09/2017  ? Degenerative disc disease, lumbar 07/07/2016  ? Degenerative lumbar spinal stenosis 04/07/2021  ? Dislocation of left middle finger 01/21/2019  ? Encounter for immunization 01/28/2021  ? Erectile dysfunction 01/28/2021  ? History of nutritional deficiency 10/26/2021  ? Hyperglycemia due to type 2 diabetes mellitus (HCC) 01/28/2021  ? Hyperlipidemia   ? Hypertensive heart disease without CHF   ? 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 type 2 diabetes mellitus (HCC) 01/28/2021  ? Mixed hyperlipidemia 05/18/2016  ? Nonallopathic lesion of lumbosacral region 07/07/2016  ? Nonallopathic lesion of sacral region 07/07/2016  ? Nonallopathic lesion of thoracic region 07/07/2016  ? Obesity   ? Shoulder joint pain 01/28/2021  ? Sleep apnea   ? "dx'd; never wore mask" (05/18/2016)  ? Squamous carcinoma   ? cut off right upper arm, left shoulder; froze off nose and right side of face  ? Type II diabetes mellitus (HCC)   ? Unstable angina (HCC)   ? Vitamin D deficiency 01/28/2021  ? Vomiting   ?  ?No Known Allergies ? ? ?Review of Systems: ? No headache, visual changes, nausea, vomiting, diarrhea, constipation, dizziness, abdominal pain, skin rash, fevers, chills, night sweats, weight loss, swollen lymph nodes, body aches, joint swelling, chest pain, shortness of breath, mood changes. POSITIVE muscle aches ? ?Objective  ?  There were no vitals taken for this visit. ?  ?General: No apparent distress alert and oriented x3 mood and affect normal, dressed appropriately.  ?HEENT: Pupils equal, extraocular movements intact  ?Respiratory: Patient's speak in full sentences and does not appear short of breath  ?Cardiovascular: No lower extremity edema, non tender, no erythema  ?Neuro: Cranial nerves II through XII are intact, neurovascularly intact in all extremities with 2+ DTRs and 2+ pulses.  ?Gait normal with good balance  and coordination.  ?MSK:  Non tender with full range of motion and good stability and symmetric strength and tone of shoulders, elbows, wrist, hip, knee and ankles bilaterally.  ?Back - Normal skin, Spine with normal alignment and no deformity.  No tenderness to vertebral process palpation.  Paraspinous muscles are not tender and without spasm.   Range of motion is full at neck and lumbar sacral regions ? ?Osteopathic findings ? ?C2 flexed rotated and side bent right ?C6 flexed rotated and side bent left ?T3 extended rotated and side bent right inhaled rib ?T9 extended rotated and side bent left ?L2 flexed rotated and side bent right ?Sacrum right on right ? ? ? ? ?  ?Assessment and Plan: ? ? ? ?Nonallopathic problems ? ?Decision today to treat with OMT was based on Physical Exam ? ?After verbal consent patient was treated with HVLA, ME, FPR techniques in cervical, rib, thoracic, lumbar, and sacral  areas ? ?Patient tolerated the procedure well with improvement in symptoms ? ?Patient given exercises, stretches and lifestyle modifications ? ?See medications in patient instructions if given ? ?Patient will follow up in 4-8 weeks ? ?  ? ? ?The above documentation has been reviewed and is accurate and complete [name] ? ? ?  ? ? Note: This dictation was prepared with Dragon dictation along with smaller phrase technology. Any transcriptional errors that result from this process are unintentional.    ?  ?  ? ?

## 2021-12-16 ENCOUNTER — Ambulatory Visit: Payer: BC Managed Care – PPO | Admitting: Family Medicine

## 2021-12-16 ENCOUNTER — Other Ambulatory Visit: Payer: Self-pay | Admitting: Family Medicine

## 2021-12-24 DIAGNOSIS — E113312 Type 2 diabetes mellitus with moderate nonproliferative diabetic retinopathy with macular edema, left eye: Secondary | ICD-10-CM | POA: Diagnosis not present

## 2021-12-24 DIAGNOSIS — H43823 Vitreomacular adhesion, bilateral: Secondary | ICD-10-CM | POA: Diagnosis not present

## 2021-12-24 DIAGNOSIS — E113211 Type 2 diabetes mellitus with mild nonproliferative diabetic retinopathy with macular edema, right eye: Secondary | ICD-10-CM | POA: Diagnosis not present

## 2021-12-24 DIAGNOSIS — H35372 Puckering of macula, left eye: Secondary | ICD-10-CM | POA: Diagnosis not present

## 2021-12-30 DIAGNOSIS — S335XXD Sprain of ligaments of lumbar spine, subsequent encounter: Secondary | ICD-10-CM | POA: Diagnosis not present

## 2021-12-30 DIAGNOSIS — M47816 Spondylosis without myelopathy or radiculopathy, lumbar region: Secondary | ICD-10-CM | POA: Diagnosis not present

## 2022-01-08 ENCOUNTER — Other Ambulatory Visit: Payer: Self-pay | Admitting: Family Medicine

## 2022-01-19 NOTE — Progress Notes (Signed)
?Tim Cook D.O. ?[facility] ?[address] [place] [address] ?Phone: [phone] ?Subjective:   ?I, Tim Cook, am serving as a scribe for Dr. Antoine Cook. ? ?This visit occurred during the SARS-CoV-2 public health emergency.  Safety protocols were in place, including screening questions prior to the visit, additional usage of staff PPE, and extensive cleaning of exam room while observing appropriate contact time as indicated for disinfecting solutions.  ? ?I'm seeing this patient by the request  of:  Tim Lund, PA ? ?CC: Low back pain ? ?[id number]  ?Tim Cook is a 61 y.o. male coming in with complaint of back and neck pain. OMT 11/04/2021. Patient states that he is done with PT. Back is doing well. [place] and his back pain increased. Has not taken gabapentin in 1 week.  ? ?Medications patient has been prescribed: Gabapentin, Zanaflex, Vit D ? ?Taking: ? ? ?  ? ? ? ? ?Reviewed prior external information including notes and imaging from previsou exam, outside providers and external EMR if available.  ? ?As well as notes that were available from care everywhere and other healthcare systems. ? ?Past medical history, social, surgical and family history all reviewed in electronic medical record.  No pertanent information unless stated regarding to the chief complaint.  ? ?Past Medical History:  ?Diagnosis Date  ? Acute lateral meniscal tear 06/09/2016  ? Arthritis   ? "knees, lower back, hands" (05/18/2016)  ? Atherosclerotic heart disease of native coronary artery without angina pectoris   ? 05/18/16  Cath severe 90% LAD prox, 99% mid, Circ dominant with 30% OM1, 50% OM2, 30% OM3, 60% distal circ and PL, severe stenosis nondominant RCA.  EF 55%  3.0x 20, 2.5 x 20 mm, 2.25 x 16 mm Promus premier sent to L[facility] Dr. Clifton James   ? Bariatric surgery status 05/18/2016  ? Benign essential hypertension 01/28/2021  ? Bursitis of right shoulder 01/21/2019  ? CAD  (coronary artery disease), native coronary artery   ? 05/18/16  Cath severe 90% LAD prox, 99% mid, Circ dominant with 30% OM1, 50% OM2, 30% OM3, 60% distal circ and PL, severe stenosis nondominant RCA.  EF 55%  3.0x 20, 2.5 x 20 mm, 2.25 x 16 mm Promus premier sent to L[facility] Dr. Clifton James   ? Chronic bronchitis (HCC)   ? "most years" (05/18/2016)  ? Chronic lower back pain   ? Closed fracture of right distal radius 03/12/2020  ? Closed fracture of right proximal humerus 03/12/2020  ? Controlled type 2 diabetes with retinopathy (HCC)   ? DDD (degenerative disc disease), lumbar   ? Degenerative arthritis of right knee 05/09/2017  ? Degenerative disc disease, lumbar 07/07/2016  ? Degenerative lumbar spinal stenosis 04/07/2021  ? Dislocation of left middle finger 01/21/2019  ? Encounter for immunization 01/28/2021  ? Erectile dysfunction 01/28/2021  ? History of nutritional deficiency 10/26/2021  ? Hyperglycemia due to type 2 diabetes mellitus (HCC) 01/28/2021  ? Hyperlipidemia   ? Hypertensive heart disease without CHF   ? 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 type 2 diabetes mellitus (HCC) 01/28/2021  ? Mixed hyperlipidemia 05/18/2016  ? Nonallopathic lesion of lumbosacral region 07/07/2016  ? Nonallopathic lesion of sacral region 07/07/2016  ? Nonallopathic lesion of thoracic region 07/07/2016  ? Obesity   ? Shoulder joint pain 01/28/2021  ? Sleep apnea   ? "dx'd; never wore mask" (05/18/2016)  ? Squamous carcinoma   ?  cut off right upper arm, left shoulder; froze off nose and right side of face  ? Type II diabetes mellitus (HCC)   ? Unstable angina (HCC)   ? Vitamin D deficiency 01/28/2021  ? Vomiting   ?  ?No Known Allergies ? ? ?Review of Systems: ? No headache, visual changes, nausea, vomiting, diarrhea, constipation, dizziness, abdominal pain, skin rash, fevers, chills, night sweats, weight loss, swollen lymph nodes, body aches, joint swelling, chest pain, shortness of breath, mood changes.  POSITIVE muscle aches ? ?Objective  ?Blood pressure 124/82, pulse 95, height 5\' 9"  (1.753 m), weight 252 lb (114.3 kg), SpO2 97 %. ?  ?General: No apparent distress alert and oriented x3 mood and affect normal, dressed appropriately.  ?HEENT: Pupils equal, extraocular movements intact  ?Respiratory: Patient's speak in full sentences and does not appear short of breath  ?Cardiovascular: No lower extremity edema, non tender, no erythema  ?Low back exam does have loss of lordosis, patient still working on core strength.  Patient does have tightness with FABER test bilaterally.  Tightness in the thoracolumbar juncture and laterally over the sacroiliac joint. ? ?Osteopathic findings ? ?C2 flexed rotated and side bent right ?C7 flexed rotated and side bent left ?T3 extended rotated and side bent right inhaled rib ?T9 extended rotated and side bent left ?L2 flexed rotated and side bent right ?L4 flexed rotated and side bent left ?Sacrum right on right ? ? ? ? ?  ?Assessment and Plan: ? ?Chronic lower back pain ?Patient is responding relatively well though to the formal physical therapy, does do well with the osteopathic manipulation as well.  Patient may need injections from time to time.  Patient is on disability secondary to this and other health difficulties.  Patient is to follow-up with me again 6 to 8 weeks  ? ?Nonallopathic problems ? ?Decision today to treat with OMT was based on Physical Exam ? ?After verbal consent patient was treated with HVLA, ME, FPR techniques in cervical, rib, thoracic, lumbar, and sacral  areas ? ?Patient tolerated the procedure well with improvement in symptoms ? ?Patient given exercises, stretches and lifestyle modifications ? ?See medications in patient instructions if given ? ?Patient will follow up in 4-8 weeks ? ?  ? ? ?The above documentation has been reviewed and is accurate and complete , DO ? ? ? ?  ? ? Note: This dictation was prepared with Dragon dictation along  with smaller phrase technology. Any transcriptional errors that result from this process are unintentional.    ?  ?  ? ?

## 2022-01-20 ENCOUNTER — Ambulatory Visit: Payer: BC Managed Care – PPO | Admitting: Family Medicine

## 2022-01-20 VITALS — BP 124/82 | HR 95 | Ht 69.0 in | Wt 252.0 lb

## 2022-01-20 DIAGNOSIS — M9901 Segmental and somatic dysfunction of cervical region: Secondary | ICD-10-CM

## 2022-01-20 DIAGNOSIS — M9908 Segmental and somatic dysfunction of rib cage: Secondary | ICD-10-CM | POA: Diagnosis not present

## 2022-01-20 DIAGNOSIS — G8929 Other chronic pain: Secondary | ICD-10-CM

## 2022-01-20 DIAGNOSIS — M9903 Segmental and somatic dysfunction of lumbar region: Secondary | ICD-10-CM | POA: Diagnosis not present

## 2022-01-20 DIAGNOSIS — M9904 Segmental and somatic dysfunction of sacral region: Secondary | ICD-10-CM | POA: Diagnosis not present

## 2022-01-20 DIAGNOSIS — M545 Low back pain, unspecified: Secondary | ICD-10-CM | POA: Diagnosis not present

## 2022-01-20 DIAGNOSIS — M9902 Segmental and somatic dysfunction of thoracic region: Secondary | ICD-10-CM

## 2022-01-20 NOTE — Assessment & Plan Note (Signed)
Patient is responding relatively well though to the formal physical therapy, does do well with the osteopathic manipulation as well.  Patient may need injections from time to time.  Patient is on disability secondary to this and other health difficulties.  Patient is to follow-up with me again 6 to 8 weeks ?

## 2022-01-20 NOTE — Patient Instructions (Signed)
Good to see you ?Tell other half hi ?Consider singulair ?See me in 2-3 months ? ?

## 2022-01-21 DIAGNOSIS — M48061 Spinal stenosis, lumbar region without neurogenic claudication: Secondary | ICD-10-CM | POA: Diagnosis not present

## 2022-01-21 DIAGNOSIS — M5416 Radiculopathy, lumbar region: Secondary | ICD-10-CM | POA: Diagnosis not present

## 2022-01-21 DIAGNOSIS — Z6837 Body mass index (BMI) 37.0-37.9, adult: Secondary | ICD-10-CM | POA: Diagnosis not present

## 2022-01-21 DIAGNOSIS — M545 Low back pain, unspecified: Secondary | ICD-10-CM | POA: Diagnosis not present

## 2022-02-03 ENCOUNTER — Other Ambulatory Visit: Payer: Self-pay | Admitting: Family Medicine

## 2022-03-02 ENCOUNTER — Other Ambulatory Visit: Payer: Self-pay | Admitting: Family Medicine

## 2022-03-10 DIAGNOSIS — I2510 Atherosclerotic heart disease of native coronary artery without angina pectoris: Secondary | ICD-10-CM | POA: Diagnosis not present

## 2022-03-10 DIAGNOSIS — Z125 Encounter for screening for malignant neoplasm of prostate: Secondary | ICD-10-CM | POA: Diagnosis not present

## 2022-03-10 DIAGNOSIS — E782 Mixed hyperlipidemia: Secondary | ICD-10-CM | POA: Diagnosis not present

## 2022-03-10 DIAGNOSIS — E1169 Type 2 diabetes mellitus with other specified complication: Secondary | ICD-10-CM | POA: Diagnosis not present

## 2022-03-10 DIAGNOSIS — E559 Vitamin D deficiency, unspecified: Secondary | ICD-10-CM | POA: Diagnosis not present

## 2022-03-10 DIAGNOSIS — Z23 Encounter for immunization: Secondary | ICD-10-CM | POA: Diagnosis not present

## 2022-03-10 DIAGNOSIS — I10 Essential (primary) hypertension: Secondary | ICD-10-CM | POA: Diagnosis not present

## 2022-03-10 DIAGNOSIS — Z0000 Encounter for general adult medical examination without abnormal findings: Secondary | ICD-10-CM | POA: Diagnosis not present

## 2022-03-14 ENCOUNTER — Telehealth: Payer: Self-pay | Admitting: Cardiology

## 2022-03-14 ENCOUNTER — Telehealth: Payer: Self-pay

## 2022-03-14 NOTE — Telephone Encounter (Signed)
Left message for pt that we have received his lipid panel from Dr. Donalee Citrin.

## 2022-03-14 NOTE — Telephone Encounter (Signed)
Patient is calling wanting to confirm if his 06/16 labs performed with Dr. Marylou Flesher office were received.

## 2022-03-22 ENCOUNTER — Telehealth: Payer: Self-pay

## 2022-03-22 NOTE — Telephone Encounter (Signed)
Spoke with pt about lab results. Encouraged to continue current dose of Lipitor and keep follow up appt in Aug. Pt agreed and had no questions or concerns.

## 2022-03-22 NOTE — Progress Notes (Signed)
[name] [address] [place] [address] Phone: [phone] Subjective:   Tim Cook, am serving as a scribe for Dr. Antoine Primas.   I'm seeing this patient by the request  of:  Wilfrid Lund, PA  CC: Back and neck pain follow-up  [id number]  Tim Cook is a 61 y.o. male coming in with complaint of back and neck pain. OMT 01/20/2022. Patient states that his back is sore due to caring for granddaughter. Has been doing a lot of traveling which causes tightness in his back. Uses gabapentin prn.   Medications patient has been prescribed: Gabapentin, Zanaflex  Taking: Yes         Reviewed prior external information including notes and imaging from previsou exam, outside providers and external EMR if available.   As well as notes that were available from care everywhere and other healthcare systems.  Past medical history, social, surgical and family history all reviewed in electronic medical record.  No pertanent information unless stated regarding to the chief complaint.   Past Medical History:  Diagnosis Date   Acute lateral meniscal tear 06/09/2016   Arthritis    "knees, lower back, hands" (05/18/2016)   Atherosclerotic heart disease of native coronary artery without angina pectoris    05/18/16  Cath severe 90% LAD prox, 99% mid, Circ dominant with 30% OM1, 50% OM2, 30% OM3, 60% distal circ and PL, severe stenosis nondominant RCA.  EF 55%  3.0x 20, 2.5 x 20 mm, 2.25 x 16 mm Promus premier sent to L[facility] Dr. Clifton James    Bariatric surgery status 05/18/2016   Benign essential hypertension 01/28/2021   Bursitis of right shoulder 01/21/2019   CAD (coronary artery disease), native coronary artery    05/18/16  Cath severe 90% LAD prox, 99% mid, Circ dominant with 30% OM1, 50% OM2, 30% OM3, 60% distal circ and PL, severe stenosis nondominant RCA.  EF 55%  3.0x 20, 2.5 x 20 mm, 2.25 x 16 mm Promus premier sent to L[facility] Dr. Clifton James    Chronic  bronchitis [facility])    "most years" (05/18/2016)   Chronic lower back pain    Closed fracture of right distal radius 03/12/2020   Closed fracture of right proximal humerus 03/12/2020   Controlled type 2 diabetes with retinopathy (HCC)    DDD (degenerative disc disease), lumbar    Degenerative arthritis of right knee 05/09/2017   Degenerative disc disease, lumbar 07/07/2016   Degenerative lumbar spinal stenosis 04/07/2021   Dislocation of left middle finger 01/21/2019   Encounter for immunization 01/28/2021   Erectile dysfunction 01/28/2021   History of nutritional deficiency 10/26/2021   Hyperglycemia due to type 2 diabetes mellitus (HCC) 01/28/2021   Hyperlipidemia    Hypertensive heart disease without CHF    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 type 2 diabetes mellitus (HCC) 01/28/2021   Mixed hyperlipidemia 05/18/2016   Nonallopathic lesion of lumbosacral region 07/07/2016   Nonallopathic lesion of sacral region 07/07/2016   Nonallopathic lesion of thoracic region 07/07/2016   Obesity    Shoulder joint pain 01/28/2021   Sleep apnea    "dx'd; never wore mask" (05/18/2016)   Squamous carcinoma    cut off right upper arm, left shoulder; froze off nose and right side of face   Type II diabetes mellitus (HCC)    Unstable angina (HCC)    Vitamin D deficiency 01/28/2021   Vomiting     No Known Allergies  Review of Systems:  No headache, visual changes, nausea, vomiting, diarrhea, constipation, dizziness, abdominal pain, skin rash, fevers, chills, night sweats, weight loss, swollen lymph nodes, body aches, joint swelling, chest pain, shortness of breath, mood changes. POSITIVE muscle aches  Objective  Blood pressure 110/74, pulse (!) 114, height 5\' 9"  (1.753 m), weight 256 lb (116.1 kg), SpO2 98 %.   General: No apparent distress alert and oriented x3 mood and affect normal, dressed appropriately.  HEENT: Pupils equal, extraocular movements intact   Respiratory: Patient's speak in full sentences and does not appear short of breath  Cardiovascular: No lower extremity edema, non tender, no erythema  Gait MSK:  Back no back exam has significant loss of lordosis.  Significant tightness with FABER test bilaterally right greater than left.  Patient does have tightness throughout all range of motion.  Poor core strength noted.    Osteopathic findings  C6 flexed rotated and side bent left T3 extended rotated and side bent right inhaled rib T9 extended rotated and side bent left L2 flexed rotated and side bent left Sacrum right on right       Assessment and Plan:  Lumbago with sciatica, right side Patient low back still has a loss of lordosis.  Patient is having surgical intervention.  Still wants to hold on any tenderness more aggressive at the moment.  Discussed icing regimen and home exercises.  Encourage patient to continue work on core strength.  Follow-up again in 6 to 8 weeks Continue the gabapentin and Zanaflex as needed.  Nonallopathic problems  Decision today to treat with OMT was based on Physical Exam  After verbal consent patient was treated with HVLA, ME, FPR techniques in cervical, rib, thoracic, lumbar, and sacral  areas  Patient tolerated the procedure well with improvement in symptoms  Patient given exercises, stretches and lifestyle modifications  See medications in patient instructions if given  Patient will follow up in 6-8 weeks     The above documentation has been reviewed and is accurate and complete , DO         Note: This dictation was prepared with Dragon dictation along with smaller phrase technology. Any transcriptional errors that result from this process are unintentional.

## 2022-03-24 ENCOUNTER — Ambulatory Visit: Payer: BC Managed Care – PPO | Admitting: Family Medicine

## 2022-03-24 VITALS — BP 110/74 | HR 114 | Ht 69.0 in | Wt 256.0 lb

## 2022-03-24 DIAGNOSIS — M9903 Segmental and somatic dysfunction of lumbar region: Secondary | ICD-10-CM

## 2022-03-24 DIAGNOSIS — M9904 Segmental and somatic dysfunction of sacral region: Secondary | ICD-10-CM | POA: Diagnosis not present

## 2022-03-24 DIAGNOSIS — M9901 Segmental and somatic dysfunction of cervical region: Secondary | ICD-10-CM | POA: Diagnosis not present

## 2022-03-24 DIAGNOSIS — M5441 Lumbago with sciatica, right side: Secondary | ICD-10-CM

## 2022-03-24 DIAGNOSIS — G8929 Other chronic pain: Secondary | ICD-10-CM | POA: Diagnosis not present

## 2022-03-24 DIAGNOSIS — M9908 Segmental and somatic dysfunction of rib cage: Secondary | ICD-10-CM | POA: Diagnosis not present

## 2022-03-24 DIAGNOSIS — M9902 Segmental and somatic dysfunction of thoracic region: Secondary | ICD-10-CM

## 2022-03-24 NOTE — Assessment & Plan Note (Signed)
Patient low back still has a loss of lordosis.  Patient is having surgical intervention.  Still wants to hold on any tenderness more aggressive at the moment.  Discussed icing regimen and home exercises.  Encourage patient to continue work on core strength.  Follow-up again in 6 to 8 weeks

## 2022-03-29 ENCOUNTER — Other Ambulatory Visit: Payer: Self-pay | Admitting: Family Medicine

## 2022-04-26 ENCOUNTER — Other Ambulatory Visit: Payer: Self-pay | Admitting: Family Medicine

## 2022-05-04 NOTE — Progress Notes (Unsigned)
[name] [address] [place] [address] Phone: [phone] Subjective:   INadine Counts, am serving as a scribe for Dr. Antoine Primas.  I'm seeing this patient by the request  of:  Wilfrid Lund, PA  CC: Back and neck pain following up   [id number]  Tim Cook is a 61 y.o. male coming in with complaint of back and neck pain. OMT 03/24/2022. Patient states same per usual. In a little pain today. No new issues. Was takign care of the grandchild   Medications patient has been prescribed: Zanaflex        Reviewed prior external information including notes and imaging from previsou exam, outside providers and external EMR if available.   As well as notes that were available from care everywhere and other healthcare systems.  Past medical history, social, surgical and family history all reviewed in electronic medical record.  No pertanent information unless stated regarding to the chief complaint.   Past Medical History:  Diagnosis Date   Acute lateral meniscal tear 06/09/2016   Arthritis    "knees, lower back, hands" (05/18/2016)   Atherosclerotic heart disease of native coronary artery without angina pectoris    05/18/16  Cath severe 90% LAD prox, 99% mid, Circ dominant with 30% OM1, 50% OM2, 30% OM3, 60% distal circ and PL, severe stenosis nondominant RCA.  EF 55%  3.0x 20, 2.5 x 20 mm, 2.25 x 16 mm Promus premier sent to LAD Dr. Clifton James    Bariatric surgery status 05/18/2016   Benign essential hypertension 01/28/2021   Bursitis of right shoulder 01/21/2019   CAD (coronary artery disease), native coronary artery    05/18/16  Cath severe 90% LAD prox, 99% mid, Circ dominant with 30% OM1, 50% OM2, 30% OM3, 60% distal circ and PL, severe stenosis nondominant RCA.  EF 55%  3.0x 20, 2.5 x 20 mm, 2.25 x 16 mm Promus premier sent to LAD Dr. Clifton James    Chronic bronchitis [facility])    "most years" (05/18/2016)   Chronic lower back pain    Closed  fracture of right distal radius 03/12/2020   Closed fracture of right proximal humerus 03/12/2020   Controlled type 2 diabetes with retinopathy (HCC)    DDD (degenerative disc disease), lumbar    Degenerative arthritis of right knee 05/09/2017   Degenerative disc disease, lumbar 07/07/2016   Degenerative lumbar spinal stenosis 04/07/2021   Dislocation of left middle finger 01/21/2019   Encounter for immunization 01/28/2021   Erectile dysfunction 01/28/2021   History of nutritional deficiency 10/26/2021   Hyperglycemia due to type 2 diabetes mellitus (HCC) 01/28/2021   Hyperlipidemia    Hypertensive heart disease without CHF    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 type 2 diabetes mellitus (HCC) 01/28/2021   Mixed hyperlipidemia 05/18/2016   Nonallopathic lesion of lumbosacral region 07/07/2016   Nonallopathic lesion of sacral region 07/07/2016   Nonallopathic lesion of thoracic region 07/07/2016   Obesity    Shoulder joint pain 01/28/2021   Sleep apnea    "dx'd; never wore mask" (05/18/2016)   Squamous carcinoma    cut off right upper arm, left shoulder; froze off nose and right side of face   Type II diabetes mellitus (HCC)    Unstable angina (HCC)    Vitamin D deficiency 01/28/2021   Vomiting     No Known Allergies   Review of Systems:  No headache, visual changes, nausea, vomiting, diarrhea, constipation,  dizziness, abdominal pain, skin rash, fevers, chills, night sweats, weight loss, swollen lymph nodes, body aches, joint swelling, chest pain, shortness of breath, mood changes. POSITIVE muscle aches  Objective  Blood pressure 112/68, pulse 97, height 5\' 9"  (1.753 m), weight 259 lb (117.5 kg), SpO2 98 %.   General: No apparent distress alert and oriented x3 mood and affect normal, dressed appropriately.  HEENT: Pupils equal, extraocular movements intact  Respiratory: Patient's speak in full sentences and does not appear short of breath   Cardiovascular: No lower extremity edema, non tender, no erythema  Gait mild antalgic gait MSK:  Back loss of lordosis.  Still not significant good core strength.  The patient is still overweight.  Tightness with FABER test.  Lacks some range of motion as well.  Osteopathic findings  C2 flexed rotated and side bent right C6 flexed rotated and side bent left T3 extended rotated and side bent right inhaled rib T8 extended rotated and side bent left L2 flexed rotated and side bent right L5 flexed rotated and side bent left Sacrum right on right       Assessment and Plan:  Degenerative lumbar spinal stenosis Chronic, with exacerbation.  Discussed gabapentin.  Discussed core strengthening.  Still responding well to manipulation.  Muscle relaxers for breakthrough pain.  Follow-up again in 6 to 8 weeks    Nonallopathic problems  Decision today to treat with OMT was based on Physical Exam  After verbal consent patient was treated with HVLA, ME, FPR techniques in cervical, rib, thoracic, lumbar, and sacral  areas  Patient tolerated the procedure well with improvement in symptoms  Patient given exercises, stretches and lifestyle modifications  See medications in patient instructions if given  Patient will follow up in 4-8 weeks    The above documentation has been reviewed and is accurate and complete , DO          Note: This dictation was prepared with Dragon dictation along with smaller phrase technology. Any transcriptional errors that result from this process are unintentional.

## 2022-05-05 ENCOUNTER — Ambulatory Visit: Payer: BC Managed Care – PPO | Admitting: Family Medicine

## 2022-05-05 VITALS — BP 112/68 | HR 97 | Ht 69.0 in | Wt 259.0 lb

## 2022-05-05 DIAGNOSIS — M9904 Segmental and somatic dysfunction of sacral region: Secondary | ICD-10-CM | POA: Diagnosis not present

## 2022-05-05 DIAGNOSIS — M9903 Segmental and somatic dysfunction of lumbar region: Secondary | ICD-10-CM | POA: Diagnosis not present

## 2022-05-05 DIAGNOSIS — M9901 Segmental and somatic dysfunction of cervical region: Secondary | ICD-10-CM | POA: Diagnosis not present

## 2022-05-05 DIAGNOSIS — M9908 Segmental and somatic dysfunction of rib cage: Secondary | ICD-10-CM

## 2022-05-05 DIAGNOSIS — M48061 Spinal stenosis, lumbar region without neurogenic claudication: Secondary | ICD-10-CM

## 2022-05-05 DIAGNOSIS — M9902 Segmental and somatic dysfunction of thoracic region: Secondary | ICD-10-CM

## 2022-05-05 NOTE — Assessment & Plan Note (Signed)
Chronic, with exacerbation.  Discussed gabapentin.  Discussed core strengthening.  Still responding well to manipulation.  Muscle relaxers for breakthrough pain.  Follow-up again in 6 to 8 weeks

## 2022-05-05 NOTE — Patient Instructions (Signed)
Good to see you   

## 2022-05-13 ENCOUNTER — Ambulatory Visit: Payer: BC Managed Care – PPO | Admitting: Cardiology

## 2022-05-13 ENCOUNTER — Encounter: Payer: Self-pay | Admitting: Cardiology

## 2022-05-13 VITALS — BP 124/58 | HR 88 | Ht 69.0 in | Wt 249.0 lb

## 2022-05-13 DIAGNOSIS — E782 Mixed hyperlipidemia: Secondary | ICD-10-CM

## 2022-05-13 DIAGNOSIS — I2510 Atherosclerotic heart disease of native coronary artery without angina pectoris: Secondary | ICD-10-CM | POA: Diagnosis not present

## 2022-05-13 DIAGNOSIS — E119 Type 2 diabetes mellitus without complications: Secondary | ICD-10-CM

## 2022-05-13 DIAGNOSIS — I10 Essential (primary) hypertension: Secondary | ICD-10-CM | POA: Diagnosis not present

## 2022-05-13 NOTE — Patient Instructions (Signed)

## 2022-05-13 NOTE — Progress Notes (Unsigned)
Cardiology Office Note:    Date:  05/13/2022   ID:  Tim Cook, DOB 10-Jul-1961, MRN [id number]  PCP:  Wilfrid Lund, PA  Cardiologist:  Gypsy Balsam, MD    Referring MD: Wilfrid Lund, PA   No chief complaint on file. Doing very well  History of Present Illness:    Tim Cook is a 61 y.o. male with past medical history significant for coronary artery disease in 2017 he required PTCA and stenting to proximal LAD as well as mid LAD for 90% stenosis additional past medical history include essential hypertension, diabetes, dyslipidemia, sleep apnea.  He also does have chronic back problem did have surgery last year. Comes today to my office for follow-up.  Overall doing very well.  Denies of any chest pain tightness squeezing pressure burning chest no palpitations dizziness swelling of lower extremities he started walking on the regular basis, he retired to get some more time and he is enjoying it.  Biggest issue appears to be still of back issues  Past Medical History:  Diagnosis Date   Acute lateral meniscal tear 06/09/2016   Arthritis    "knees, lower back, hands" (05/18/2016)   Atherosclerotic heart disease of native coronary artery without angina pectoris    05/18/16  Cath severe 90% LAD prox, 99% mid, Circ dominant with 30% OM1, 50% OM2, 30% OM3, 60% distal circ and PL, severe stenosis nondominant RCA.  EF 55%  3.0x 20, 2.5 x 20 mm, 2.25 x 16 mm Promus premier sent to LAD Dr. Clifton James    Bariatric surgery status 05/18/2016   Benign essential hypertension 01/28/2021   Bursitis of right shoulder 01/21/2019   CAD (coronary artery disease), native coronary artery    05/18/16  Cath severe 90% LAD prox, 99% mid, Circ dominant with 30% OM1, 50% OM2, 30% OM3, 60% distal circ and PL, severe stenosis nondominant RCA.  EF 55%  3.0x 20, 2.5 x 20 mm, 2.25 x 16 mm Promus premier sent to LAD Dr. Clifton James    Chronic bronchitis [facility])    "most years" (05/18/2016)   Chronic lower back pain     Closed fracture of right distal radius 03/12/2020   Closed fracture of right proximal humerus 03/12/2020   Controlled type 2 diabetes with retinopathy (HCC)    DDD (degenerative disc disease), lumbar    Degenerative arthritis of right knee 05/09/2017   Degenerative disc disease, lumbar 07/07/2016   Degenerative lumbar spinal stenosis 04/07/2021   Dislocation of left middle finger 01/21/2019   Encounter for immunization 01/28/2021   Erectile dysfunction 01/28/2021   History of nutritional deficiency 10/26/2021   Hyperglycemia due to type 2 diabetes mellitus (HCC) 01/28/2021   Hyperlipidemia    Hypertensive heart disease without CHF    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 type 2 diabetes mellitus (HCC) 01/28/2021   Mixed hyperlipidemia 05/18/2016   Nonallopathic lesion of lumbosacral region 07/07/2016   Nonallopathic lesion of sacral region 07/07/2016   Nonallopathic lesion of thoracic region 07/07/2016   Obesity    Shoulder joint pain 01/28/2021   Sleep apnea    "dx'd; never wore mask" (05/18/2016)   Squamous carcinoma    cut off right upper arm, left shoulder; froze off nose and right side of face   Type II diabetes mellitus (HCC)    Unstable angina (HCC)    Vitamin D deficiency 01/28/2021   Vomiting     Past Surgical History:  Procedure Laterality Date   CARDIAC  CATHETERIZATION N/A 05/18/2016   Procedure: Left Heart Cath and Coronary Angiography;  Surgeon: Kathleene Hazel, MD;  Location: [facility] INVASIVE CV LAB;  Service: Cardiovascular;  Laterality: N/A;   CARDIAC CATHETERIZATION N/A 05/18/2016   Procedure: Coronary Stent Intervention;  Surgeon: Kathleene Hazel, MD;  Location: [facility] INVASIVE CV LAB;  Service: Cardiovascular;  Laterality: N/A;   CARPAL TUNNEL RELEASE Right 03/17/2020   Procedure: CARPAL TUNNEL RELEASE;  Surgeon: Teryl Lucy, MD;  Location: [facility] ORS;  Service: Orthopedics;  Laterality: Right;   CORONARY ANGIOPLASTY     FOOT FRACTURE  SURGERY Right 1970s   LAPAROSCOPIC GASTRIC BANDING  07/2007   by Dr. Daphine Deutscher   ORIF HUMERUS FRACTURE Right 03/17/2020   Procedure: OPEN REDUCTION INTERNAL FIXATION (ORIF) PROXIMAL HUMERUS FRACTURE AND DISTAL RADIUS FRACTURE;  Surgeon: Teryl Lucy, MD;  Location: [facility];  Service: Orthopedics;  Laterality: Right;   PILONIDAL CYST EXCISION  ~ 1985   "took 8 out all at once"   SQUAMOUS CELL CARCINOMA EXCISION     right upper arm, left shoulder    Current Medications: Current Meds  Medication Sig   aspirin EC 81 MG tablet Take 81 mg by mouth daily.   atorvastatin (LIPITOR) 40 MG tablet Take 40 mg by mouth daily.   FARXIGA 10 MG TABS tablet Take 10 mg by mouth daily.    gabapentin (NEURONTIN) 100 MG capsule TAKE 2 CAPSULES BY MOUTH AT BEDTIME   HYDROcodone-acetaminophen (NORCO) 10-325 MG tablet Take 1 tablet by mouth as needed for pain.   loratadine (CLARITIN) 10 MG tablet Take 10 mg by mouth daily.   losartan (COZAAR) 100 MG tablet Take 100 mg by mouth daily.   metFORMIN (GLUCOPHAGE) 500 MG tablet Take 1,000 mg by mouth 2 (two) times daily with a meal.   Multiple Vitamin (MULTIVITAMIN) capsule Take 1 capsule by mouth daily. Unknown strength   naproxen sodium (ALEVE) 220 MG tablet Take 220 mg by mouth daily as needed (pain).   nitroGLYCERIN (NITROSTAT) 0.4 MG SL tablet PLACE 1 TABLET (0.4 MG TOTAL) UNDER THE TONGUE EVERY 5 (FIVE) MINUTES AS NEEDED FOR CHEST PAIN. (Patient taking differently: Place 0.4 mg under the tongue every 5 (five) minutes as needed for chest pain.)   tirzepatide (MOUNJARO) 7.5 MG/0.5ML Pen 7.5mg    tiZANidine (ZANAFLEX) 2 MG tablet TAKE 1 TABLET BY MOUTH EVERYDAY AT BEDTIME   Vitamin D, Ergocalciferol, (DRISDOL) 1.25 MG (50000 UNIT) CAPS capsule TAKE 1 CAPSULE EVERY 7 DAYS     Allergies:   Patient has no known allergies.   Social History   Socioeconomic History   Marital status: Married    Spouse name: Not on file   Number of children: Not on file   Years of  education: Not on file   Highest education level: Not on file  Occupational History   Not on file  Tobacco Use   Smoking status: Former    Packs/day: 0.50    Years: 12.00    Total pack years: 6.00    Types: Cigarettes    Quit date: 12/15/1991    Years since quitting: 30.4   Smokeless tobacco: Former    Types: Snuff   Tobacco comments:    "quit snuff in ~ 1981"  Vaping Use   Vaping Use: Never used  Substance and Sexual Activity   Alcohol use: Yes    Alcohol/week: 7.0 standard drinks of alcohol    Types: 7 Cans of beer per week    Comment: More social use  Drug use: No   Sexual activity: Yes  Other Topics Concern   Not on file  Social History Narrative   Not on file   Social Determinants of Health   Financial Resource Strain: Not on file  Food Insecurity: Not on file  Transportation Needs: Not on file  Physical Activity: Not on file  Stress: Not on file  Social Connections: Not on file     Family History: The patient's family history includes Diabetes Mellitus I in his sister; Heart attack in his father; Heart disease in his father, mother, and sister; Stroke in his brother and father. ROS:   Please see the history of present illness.    All 14 point review of systems negative except as described per history of present illness  EKGs/Labs/Other Studies Reviewed:      Recent Labs: No results found for requested labs within last 365 days.  Recent Lipid Panel    Component Value Date/Time   CHOL 125 12/07/2018 1520   TRIG 103 12/07/2018 1520   HDL 57 12/07/2018 1520   CHOLHDL 2.2 12/07/2018 1520   LDLCALC 47 12/07/2018 1520    Physical Exam:    VS:  BP (!) 124/58 (BP Location: Left Arm, Patient Position: Sitting)   Pulse 88   Ht 5\' 9"  (1.753 m)   Wt 249 lb (112.9 kg)   SpO2 97%   BMI 36.77 kg/m     Wt Readings from Last 3 Encounters:  05/13/22 249 lb (112.9 kg)  05/05/22 259 lb (117.5 kg)  03/24/22 256 lb (116.1 kg)     GEN:  Well nourished, well  developed in no acute distress HEENT: Normal NECK: No JVD; No carotid bruits LYMPHATICS: No lymphadenopathy CARDIAC: RRR, no murmurs, no rubs, no gallops RESPIRATORY:  Clear to auscultation without rales, wheezing or rhonchi  ABDOMEN: Soft, non-tender, non-distended MUSCULOSKELETAL:  No edema; No deformity  SKIN: Warm and dry LOWER EXTREMITIES: no swelling NEUROLOGIC:  Alert and oriented x 3 PSYCHIATRIC:  Normal affect   ASSESSMENT:    1. Atherosclerosis of native coronary artery of native heart without angina pectoris   2. Benign essential hypertension   3. Diabetes mellitus without complication (HCC)   4. Mixed hyperlipidemia    PLAN:    In order of problems listed above:  Coronary disease stable from that point review on appropriate medication which include antiplatelets therapy which I will continue Dyslipidemia he is taking high intensity statin which is Lipitor 40 I did review K PN done by primary care physician and fasting lipid profile from 03/10/2022 show LDL 62 HDL 56 this a good cholesterol profile. Diabetes mellitus.  His last hemoglobin A1c from K PN is 7.4 he understand the problem try to be unremarked if it is better and he was also put on new medications which seems to be helping. We did talk about healthy lifestyle including exercises on the regular basis and good diet.   Medication Adjustments/Labs and Tests Ordered: Current medicines are reviewed at length with the patient today.  Concerns regarding medicines are outlined above.  No orders of the defined types were placed in this encounter.  Medication changes: No orders of the defined types were placed in this encounter.   Signed, 03/12/2022, MD, [facility] 05/13/2022 8:20 AM    [facility]

## 2022-05-25 ENCOUNTER — Other Ambulatory Visit: Payer: Self-pay | Admitting: Family Medicine

## 2022-05-26 ENCOUNTER — Telehealth: Payer: Self-pay | Admitting: Cardiology

## 2022-05-26 ENCOUNTER — Other Ambulatory Visit: Payer: Self-pay

## 2022-05-26 MED ORDER — NITROGLYCERIN 0.4 MG SL SUBL: 0.4000 mg | SUBLINGUAL_TABLET | SUBLINGUAL | 3 refills | Status: DC | PRN

## 2022-05-26 NOTE — Telephone Encounter (Signed)
*  STAT* If patient is at the pharmacy, call can be transferred to refill team.   1. Which medications need to be refilled? (please list name of each medication and dose if known) nitroGLYCERIN (NITROSTAT) 0.4 MG SL tablet  2. Which pharmacy/location (including street and city if local pharmacy) is medication to be sent to?  [id number] - [place], [place] - [address]  3. Do they need a 30 day or 90 day supply? 90

## 2022-06-10 ENCOUNTER — Other Ambulatory Visit: Payer: Self-pay | Admitting: Cardiology

## 2022-06-12 ENCOUNTER — Other Ambulatory Visit: Payer: Self-pay | Admitting: Family Medicine

## 2022-06-17 ENCOUNTER — Other Ambulatory Visit: Payer: Self-pay | Admitting: Family Medicine

## 2022-06-20 ENCOUNTER — Other Ambulatory Visit: Payer: Self-pay | Admitting: Cardiology

## 2022-06-22 NOTE — Progress Notes (Unsigned)
Tim Cook [facility] [place] [address] [place] Phone: [phone] Subjective:   Tim Cook, am serving as a scribe for Dr. Hulan Cook.  I'm seeing this patient by the request  of:  Tim Huxley, PA  CC: Neck and back pain follow-up  MPN:[id number]  Tim Cook is a 61 y.o. male coming in with complaint of back and neck pain. OMT 05/05/2022 Patient states was good until about 3 days ago. Pain in lower back was so bad he couldn't sleep. Tried taking some gabapentin. No other complaints.  Patient states when it gets this bad he has to sleep on his couch.  Sometimes does have radicular symptoms.  Things such as standing in 1 place seems to cause more discomfort than actual movement.  Sometimes seems to improve with activity.  Medications patient has been prescribed: Gabapentin, Zanaflex  Taking:         Reviewed prior external information including notes and imaging from previsou exam, outside providers and external EMR if available.   As well as notes that were available from care everywhere and other healthcare systems.  Past medical history, social, surgical and family history all reviewed in electronic medical record.  No pertanent information unless stated regarding to the chief complaint.   Past Medical History:  Diagnosis Date   Acute lateral meniscal tear 06/09/2016   Arthritis    "knees, lower back, hands" (05/18/2016)   Atherosclerotic heart disease of native coronary artery without angina pectoris    05/18/16  Cath severe 90% LAD prox, 99% mid, Circ dominant with 30% OM1, 50% OM2, 30% OM3, 60% distal circ and PL, severe stenosis nondominant RCA.  EF 55%  3.0x 20, 2.5 x 20 mm, 2.25 x 16 mm Promus premier sent to LAD Dr. Angelena Form    Bariatric surgery status 05/18/2016   Benign essential hypertension 01/28/2021   Bursitis of right shoulder 01/21/2019   CAD (coronary artery disease), native coronary artery    05/18/16  Cath severe 90%  LAD prox, 99% mid, Circ dominant with 30% OM1, 50% OM2, 30% OM3, 60% distal circ and PL, severe stenosis nondominant RCA.  EF 55%  3.0x 20, 2.5 x 20 mm, 2.25 x 16 mm Promus premier sent to LAD Dr. Angelena Form    Chronic bronchitis [facility])    "most years" (05/18/2016)   Chronic lower back pain    Closed fracture of right distal radius 03/12/2020   Closed fracture of right proximal humerus 03/12/2020   Controlled type 2 diabetes with retinopathy ([place])    DDD (degenerative disc disease), lumbar    Degenerative arthritis of right knee 05/09/2017   Degenerative disc disease, lumbar 07/07/2016   Degenerative lumbar spinal stenosis 04/07/2021   Dislocation of left middle finger 01/21/2019   Encounter for immunization 01/28/2021   Erectile dysfunction 01/28/2021   History of nutritional deficiency 10/26/2021   Hyperglycemia due to type 2 diabetes mellitus ([place]) 01/28/2021   Hyperlipidemia    Hypertensive heart disease without CHF    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 type 2 diabetes mellitus ([place]) 01/28/2021   Mixed hyperlipidemia 05/18/2016   Nonallopathic lesion of lumbosacral region 07/07/2016   Nonallopathic lesion of sacral region 07/07/2016   Nonallopathic lesion of thoracic region 07/07/2016   Obesity    Shoulder joint pain 01/28/2021   Sleep apnea    "dx'd; never wore mask" (05/18/2016)   Squamous carcinoma    cut off right upper arm, left shoulder;  froze off nose and right side of face   Type II diabetes mellitus (HCC)    Unstable angina (HCC)    Vitamin D deficiency 01/28/2021   Vomiting     No Known Allergies   Review of Systems:  No headache, visual changes, nausea, vomiting, diarrhea, constipation, dizziness, abdominal pain, skin rash, fevers, chills, night sweats, weight loss, swollen lymph nodes, body aches, joint swelling, chest pain, shortness of breath, mood changes. POSITIVE muscle aches  Objective  Blood pressure (!) 142/76, pulse 100, height  5\' 9"  (1.753 m), weight 249 lb (112.9 kg), SpO2 95 %.   General: No apparent distress alert and oriented x3 mood and affect normal, dressed appropriately.  HEENT: Pupils equal, extraocular movements intact  Respiratory: Patient's speak in full sentences and does not appear short of breath  Cardiovascular: No lower extremity edema, non tender, no erythema  Gait MSK:  Back low back does have significant loss of lordosis.  Some tenderness to palpation diffusely in the low back.  He does have more tightness in the paraspinal musculature than usual.  Difficulty with straight leg test secondary to the discomfort.  Neck very tender to palpation over the occipital area.  Osteopathic findings  C2 flexed rotated and side bent right C3 flexed rotated and side bent left T3 extended rotated and side bent right inhaled rib T9 extended rotated and side bent left L2 flexed rotated and side bent right L5 flexed rotated and side bent left Sacrum right on right       Assessment and Plan:  Radiculopathy, lumbar region Known spinal stenosis noted.  Discussed with patient again at great length.  Discussed the importance of core strength.  Patient is doing a lot of walking.  We discussed which ones would be helpful and which ones may be harmful.  Increase activity slowly.  Follow-up again in 6 to 8 weeks.    Nonallopathic problems  Decision today to treat with OMT was based on Physical Exam  After verbal consent patient was treated with HVLA, ME, FPR techniques in cervical, rib, thoracic, lumbar, and sacral  areas  Patient tolerated the procedure well with improvement in symptoms  Patient given exercises, stretches and lifestyle modifications  See medications in patient instructions if given  Patient will follow up in 4-8 weeks     The above documentation has been reviewed and is accurate and complete [name], DO         Note: This dictation was prepared with Dragon dictation  along with smaller phrase technology. Any transcriptional errors that result from this process are unintentional.

## 2022-06-23 ENCOUNTER — Ambulatory Visit: Payer: BC Managed Care – PPO | Admitting: Family Medicine

## 2022-06-23 ENCOUNTER — Encounter: Payer: Self-pay | Admitting: Family Medicine

## 2022-06-23 VITALS — BP 142/76 | HR 100 | Ht 69.0 in | Wt 249.0 lb

## 2022-06-23 DIAGNOSIS — M9902 Segmental and somatic dysfunction of thoracic region: Secondary | ICD-10-CM

## 2022-06-23 DIAGNOSIS — M9904 Segmental and somatic dysfunction of sacral region: Secondary | ICD-10-CM

## 2022-06-23 DIAGNOSIS — M9901 Segmental and somatic dysfunction of cervical region: Secondary | ICD-10-CM

## 2022-06-23 DIAGNOSIS — M9903 Segmental and somatic dysfunction of lumbar region: Secondary | ICD-10-CM

## 2022-06-23 DIAGNOSIS — M9908 Segmental and somatic dysfunction of rib cage: Secondary | ICD-10-CM | POA: Diagnosis not present

## 2022-06-23 DIAGNOSIS — M5416 Radiculopathy, lumbar region: Secondary | ICD-10-CM

## 2022-06-23 NOTE — Patient Instructions (Addendum)
Good to see you! Gabapentin nightly for 3 nights Ice when you get home

## 2022-06-23 NOTE — Assessment & Plan Note (Signed)
Known spinal stenosis noted.  Discussed with patient again at great length.  Discussed the importance of core strength.  Patient is doing a lot of walking.  We discussed which ones would be helpful and which ones may be harmful.  Increase activity slowly.  Follow-up again in 6 to 8 weeks.

## 2022-07-04 DIAGNOSIS — D225 Melanocytic nevi of trunk: Secondary | ICD-10-CM | POA: Diagnosis not present

## 2022-07-04 DIAGNOSIS — L820 Inflamed seborrheic keratosis: Secondary | ICD-10-CM | POA: Diagnosis not present

## 2022-07-04 DIAGNOSIS — L570 Actinic keratosis: Secondary | ICD-10-CM | POA: Diagnosis not present

## 2022-07-04 DIAGNOSIS — L821 Other seborrheic keratosis: Secondary | ICD-10-CM | POA: Diagnosis not present

## 2022-07-04 DIAGNOSIS — Z85828 Personal history of other malignant neoplasm of skin: Secondary | ICD-10-CM | POA: Diagnosis not present

## 2022-07-08 DIAGNOSIS — E1169 Type 2 diabetes mellitus with other specified complication: Secondary | ICD-10-CM | POA: Diagnosis not present

## 2022-07-08 DIAGNOSIS — E782 Mixed hyperlipidemia: Secondary | ICD-10-CM | POA: Diagnosis not present

## 2022-07-08 DIAGNOSIS — I2510 Atherosclerotic heart disease of native coronary artery without angina pectoris: Secondary | ICD-10-CM | POA: Diagnosis not present

## 2022-07-08 DIAGNOSIS — I10 Essential (primary) hypertension: Secondary | ICD-10-CM | POA: Diagnosis not present

## 2022-07-08 DIAGNOSIS — Z23 Encounter for immunization: Secondary | ICD-10-CM | POA: Diagnosis not present

## 2022-07-13 ENCOUNTER — Encounter: Payer: Self-pay | Admitting: Cardiology

## 2022-07-13 ENCOUNTER — Other Ambulatory Visit: Payer: Self-pay | Admitting: Family Medicine

## 2022-08-04 NOTE — Progress Notes (Signed)
[facility] [address] [place] [address] Phone: [phone] Subjective:   Tim Cook, am serving as a scribe for Dr. Antoine Cook.  I'm seeing this patient by the request  of:  Tim Lund, PA  CC: Low back pain follow-up  LKG:[id number]  Tim Cook is a 61 y.o. male coming in with complaint of back and neck pain. OMT 06/23/2022. Patient states that he has not been walking as much and has had an increase in his pain for past week. No new injury. Denies any radiating symptoms today. Did have pain radiating down L leg for a day.  Overall states that he does think he is worsening since we have seen him at this moment.  Medications patient has been prescribed: Gabapentin, Zanaflex  Taking:         Reviewed prior external information including notes and imaging from previsou exam, outside providers and external EMR if available.   As well as notes that were available from care everywhere and other healthcare systems.  Past medical history, social, surgical and family history all reviewed in electronic medical record.  No pertanent information unless stated regarding to the chief complaint.   Past Medical History:  Diagnosis Date   Acute lateral meniscal tear 06/09/2016   Arthritis    "knees, lower back, hands" (05/18/2016)   Atherosclerotic heart disease of native coronary artery without angina pectoris    05/18/16  Cath severe 90% LAD prox, 99% mid, Circ dominant with 30% OM1, 50% OM2, 30% OM3, 60% distal circ and PL, severe stenosis nondominant RCA.  EF 55%  3.0x 20, 2.5 x 20 mm, 2.25 x 16 mm Promus premier sent to LAD Dr. Clifton Cook    Bariatric surgery status 05/18/2016   Benign essential hypertension 01/28/2021   Bursitis of right shoulder 01/21/2019   CAD (coronary artery disease), native coronary artery    05/18/16  Cath severe 90% LAD prox, 99% mid, Circ dominant with 30% OM1, 50% OM2, 30% OM3, 60% distal circ and PL, severe  stenosis nondominant RCA.  EF 55%  3.0x 20, 2.5 x 20 mm, 2.25 x 16 mm Promus premier sent to LAD Dr. Clifton Cook    Chronic bronchitis [facility])    "most years" (05/18/2016)   Chronic lower back pain    Closed fracture of right distal radius 03/12/2020   Closed fracture of right proximal humerus 03/12/2020   Controlled type 2 diabetes with retinopathy (HCC)    DDD (degenerative disc disease), lumbar    Degenerative arthritis of right knee 05/09/2017   Degenerative disc disease, lumbar 07/07/2016   Degenerative lumbar spinal stenosis 04/07/2021   Dislocation of left middle finger 01/21/2019   Encounter for immunization 01/28/2021   Erectile dysfunction 01/28/2021   History of nutritional deficiency 10/26/2021   Hyperglycemia due to type 2 diabetes mellitus (HCC) 01/28/2021   Hyperlipidemia    Hypertensive heart disease without CHF    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 type 2 diabetes mellitus (HCC) 01/28/2021   Mixed hyperlipidemia 05/18/2016   Nonallopathic lesion of lumbosacral region 07/07/2016   Nonallopathic lesion of sacral region 07/07/2016   Nonallopathic lesion of thoracic region 07/07/2016   Obesity    Shoulder joint pain 01/28/2021   Sleep apnea    "dx'd; never wore mask" (05/18/2016)   Squamous carcinoma    cut off right upper arm, left shoulder; froze off nose and right side of face   Type II diabetes mellitus (  [place])    Unstable angina ([place])    Vitamin D deficiency 01/28/2021   Vomiting     No Known Allergies   Review of Systems:  No headache, visual changes, nausea, vomiting, diarrhea, constipation, dizziness, abdominal pain, skin rash, fevers, chills, night sweats, weight loss, swollen lymph nodes,, joint swelling, chest pain, shortness of breath, mood changes. POSITIVE muscle aches, body aches  Objective  Blood pressure 110/82, pulse (!) 103, height 5\' 9"  (1.753 m), weight 246 lb (111.6 kg), SpO2 96 %.   General: No apparent distress alert  and oriented x3 mood and affect normal, dressed appropriately.  HEENT: Pupils equal, extraocular movements intact  Respiratory: Patient's speak in full sentences and does not appear short of breath  Cardiovascular: No lower extremity edema, non tender, no erythema  Still with poor core strength noted.  Significant loss of lordosis of the lumbar spine.  Patient does have tightness with FABER test bilaterally.  Osteopathic findings  C3 flexed rotated and side bent right C7 flexed rotated and side bent left T5 extended rotated and side bent right inhaled rib T8 extended rotated and side bent left L2 flexed rotated and side bent right Sacrum right on right       Assessment and Plan:  Degenerative lumbar spinal stenosis Continued difficulty with the pain.  Discussed gabapentin 200 to 300 mg at night if needed.  Patient has been taking different anti-inflammatories but discussed taking ibuprofen 600 mg up to 3 times a day for 3 days.  Discussed with patient the importance of weight loss but to continue to be active otherwise.  Patient will continue to try to do this and see how patient does.  Follow-up again in 6 to 8 weeks    Nonallopathic problems  Decision today to treat with OMT was based on Physical Exam  After verbal consent patient was treated with HVLA, ME, FPR techniques in cervical, rib, thoracic, lumbar, and sacral  areas  Patient tolerated the procedure well with improvement in symptoms  Patient given exercises, stretches and lifestyle modifications  See medications in patient instructions if given  Patient will follow up in 4-8 weeks    The above documentation has been reviewed and is accurate and complete Tim Pulley, DO          Note: This dictation was prepared with Dragon dictation along with smaller phrase technology. Any transcriptional errors that result from this process are unintentional.

## 2022-08-09 ENCOUNTER — Ambulatory Visit: Payer: BC Managed Care – PPO | Admitting: Family Medicine

## 2022-08-09 ENCOUNTER — Encounter: Payer: Self-pay | Admitting: Family Medicine

## 2022-08-09 VITALS — BP 110/82 | HR 103 | Ht 69.0 in | Wt 246.0 lb

## 2022-08-09 DIAGNOSIS — M48061 Spinal stenosis, lumbar region without neurogenic claudication: Secondary | ICD-10-CM | POA: Diagnosis not present

## 2022-08-09 DIAGNOSIS — M5416 Radiculopathy, lumbar region: Secondary | ICD-10-CM | POA: Diagnosis not present

## 2022-08-09 DIAGNOSIS — M9901 Segmental and somatic dysfunction of cervical region: Secondary | ICD-10-CM | POA: Diagnosis not present

## 2022-08-09 DIAGNOSIS — M9902 Segmental and somatic dysfunction of thoracic region: Secondary | ICD-10-CM

## 2022-08-09 MED ORDER — KETOROLAC TROMETHAMINE 60 MG/2ML IM SOLN
60.0000 mg | Freq: Once | INTRAMUSCULAR | Status: AC
  Administered 2022-08-09: 60 mg via INTRAMUSCULAR

## 2022-08-09 MED ORDER — METHYLPREDNISOLONE ACETATE 80 MG/ML IJ SUSP
80.0000 mg | Freq: Once | INTRAMUSCULAR | Status: AC
  Administered 2022-08-09: 80 mg via INTRAMUSCULAR

## 2022-08-09 NOTE — Patient Instructions (Addendum)
Good to see you  Injection in backside today You know where we are if you need [place]

## 2022-08-09 NOTE — Assessment & Plan Note (Addendum)
Continued difficulty with the pain.  Discussed gabapentin 200 to 300 mg at night if needed.  Patient has been taking different anti-inflammatories but discussed taking ibuprofen 600 mg up to 3 times a day for 3 days.  Discussed with patient the importance of weight loss but to continue to be active otherwise.  Patient will continue to try to do this and see how patient does.  Follow-up again in 6 to 8 weeksDue to severity of the pain patient also given a Toradol and Depo-Medrol injection today as well with 60 mg of Toradol and 80 mg of Depo-Medrol.

## 2022-10-17 ENCOUNTER — Other Ambulatory Visit: Payer: Self-pay | Admitting: Cardiology

## 2022-10-17 NOTE — Telephone Encounter (Signed)
Rx refill sent to pharmacy. 

## 2022-11-30 ENCOUNTER — Encounter: Payer: Self-pay | Admitting: Cardiology

## 2022-11-30 ENCOUNTER — Ambulatory Visit: Payer: Commercial Managed Care - HMO | Attending: Cardiology | Admitting: Cardiology

## 2022-11-30 VITALS — BP 148/82 | HR 96 | Ht 69.0 in | Wt 245.0 lb

## 2022-11-30 DIAGNOSIS — I2510 Atherosclerotic heart disease of native coronary artery without angina pectoris: Secondary | ICD-10-CM | POA: Diagnosis not present

## 2022-11-30 DIAGNOSIS — I10 Essential (primary) hypertension: Secondary | ICD-10-CM

## 2022-11-30 DIAGNOSIS — Z9884 Bariatric surgery status: Secondary | ICD-10-CM | POA: Diagnosis not present

## 2022-11-30 DIAGNOSIS — E119 Type 2 diabetes mellitus without complications: Secondary | ICD-10-CM | POA: Diagnosis not present

## 2022-11-30 NOTE — Patient Instructions (Signed)

## 2022-11-30 NOTE — Progress Notes (Signed)
Cardiology Office Note:    Date:  11/30/2022   ID:  Tim Cook, DOB June 20, 1961, MRN [id number]  PCP:  Lois Huxley, PA  Cardiologist:  Jenne Campus, MD    Referring MD: Lois Huxley, PA   No chief complaint on file. Doing well  History of Present Illness:    Tim Cook is a 62 y.o. male  with past medical history significant for coronary artery disease in 2017 he required PTCA and stenting to proximal LAD as well as mid LAD for 90% stenosis additional past medical history include essential hypertension, diabetes, dyslipidemia, sleep apnea. He also does have chronic back problem did have surgery last year.  Comes today to months for follow-up.  Overall doing very well.  Denies of any chest pain tightness squeezing pressure burning chest.  He admits that he is not too active he does not like winter and he is waiting for summertime to, and he promised to BL be more active.  Denies have any cardiac complaints.  Past Medical History:  Diagnosis Date   Acute lateral meniscal tear 06/09/2016   Arthritis    "knees, lower back, hands" (05/18/2016)   Atherosclerotic heart disease of native coronary artery without angina pectoris    05/18/16  Cath severe 90% LAD prox, 99% mid, Circ dominant with 30% OM1, 50% OM2, 30% OM3, 60% distal circ and PL, severe stenosis nondominant RCA.  EF 55%  3.0x 20, 2.5 x 20 mm, 2.25 x 16 mm Promus premier sent to LAD Dr. Angelena Form    Bariatric surgery status 05/18/2016   Benign essential hypertension 01/28/2021   Bursitis of right shoulder 01/21/2019   CAD (coronary artery disease), native coronary artery    05/18/16  Cath severe 90% LAD prox, 99% mid, Circ dominant with 30% OM1, 50% OM2, 30% OM3, 60% distal circ and PL, severe stenosis nondominant RCA.  EF 55%  3.0x 20, 2.5 x 20 mm, 2.25 x 16 mm Promus premier sent to LAD Dr. Angelena Form    Chronic bronchitis [facility])    "most years" (05/18/2016)   Chronic lower back pain    Closed fracture of right distal  radius 03/12/2020   Closed fracture of right proximal humerus 03/12/2020   Controlled type 2 diabetes with retinopathy ([place])    DDD (degenerative disc disease), lumbar    Degenerative arthritis of right knee 05/09/2017   Degenerative disc disease, lumbar 07/07/2016   Degenerative lumbar spinal stenosis 04/07/2021   Dislocation of left middle finger 01/21/2019   Encounter for immunization 01/28/2021   Erectile dysfunction 01/28/2021   History of nutritional deficiency 10/26/2021   Hyperglycemia due to type 2 diabetes mellitus ([place]) 01/28/2021   Hyperlipidemia    Hypertensive heart disease without CHF    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 type 2 diabetes mellitus ([place]) 01/28/2021   Mixed hyperlipidemia 05/18/2016   Nonallopathic lesion of lumbosacral region 07/07/2016   Nonallopathic lesion of sacral region 07/07/2016   Nonallopathic lesion of thoracic region 07/07/2016   Obesity    Shoulder joint pain 01/28/2021   Sleep apnea    "dx'd; never wore mask" (05/18/2016)   Squamous carcinoma    cut off right upper arm, left shoulder; froze off nose and right side of face   Type II diabetes mellitus (HCC)    Unstable angina ([place])    Vitamin D deficiency 01/28/2021   Vomiting     Past Surgical History:  Procedure Laterality Date   CARDIAC CATHETERIZATION N/A  05/18/2016   Procedure: Left Heart Cath and Coronary Angiography;  Surgeon: Burnell Blanks, MD;  Location: [place] CV LAB;  Service: Cardiovascular;  Laterality: N/A;   CARDIAC CATHETERIZATION N/A 05/18/2016   Procedure: Coronary Stent Intervention;  Surgeon: Burnell Blanks, MD;  Location: [place] CV LAB;  Service: Cardiovascular;  Laterality: N/A;   CARPAL TUNNEL RELEASE Right 03/17/2020   Procedure: CARPAL TUNNEL RELEASE;  Surgeon: Marchia Bond, MD;  Location: [facility];  Service: Orthopedics;  Laterality: Right;   CORONARY ANGIOPLASTY     FOOT FRACTURE SURGERY Right 1970s    LAPAROSCOPIC GASTRIC BANDING  07/2007   by Dr. Hassell Done   ORIF HUMERUS FRACTURE Right 03/17/2020   Procedure: OPEN REDUCTION INTERNAL FIXATION (ORIF) PROXIMAL HUMERUS FRACTURE AND DISTAL RADIUS FRACTURE;  Surgeon: Marchia Bond, MD;  Location: [facility];  Service: Orthopedics;  Laterality: Right;   PILONIDAL CYST EXCISION  ~ 1985   "took 8 out all at once"   SQUAMOUS CELL CARCINOMA EXCISION     right upper arm, left shoulder    Current Medications: Current Meds  Medication Sig   aspirin EC 81 MG tablet Take 81 mg by mouth daily.   atorvastatin (LIPITOR) 40 MG tablet Take 40 mg by mouth daily.   FARXIGA 10 MG TABS tablet Take 10 mg by mouth daily.    gabapentin (NEURONTIN) 100 MG capsule TAKE 2 CAPSULES BY MOUTH AT BEDTIME   HYDROcodone-acetaminophen (NORCO) 10-325 MG tablet Take 1 tablet by mouth as needed for pain.   loratadine (CLARITIN) 10 MG tablet Take 10 mg by mouth daily.   losartan (COZAAR) 100 MG tablet Take 100 mg by mouth daily.   metFORMIN (GLUCOPHAGE) 500 MG tablet Take 1,000 mg by mouth 2 (two) times daily with a meal.   Multiple Vitamin (MULTIVITAMIN) capsule Take 1 capsule by mouth daily. Unknown strength   naproxen sodium (ALEVE) 220 MG tablet Take 220 mg by mouth daily as needed (pain).   nitroGLYCERIN (NITROSTAT) 0.4 MG SL tablet Place 1 tablet (0.4 mg total) under the tongue every 5 (five) minutes as needed for chest pain.   tiZANidine (ZANAFLEX) 2 MG tablet TAKE 1 TABLET BY MOUTH EVERYDAY AT BEDTIME   Vitamin D, Ergocalciferol, (DRISDOL) 1.25 MG (50000 UNIT) CAPS capsule TAKE 1 CAPSULE EVERY 7 DAYS     Allergies:   Patient has no known allergies.   Social History   Socioeconomic History   Marital status: Married    Spouse name: Not on file   Number of children: Not on file   Years of education: Not on file   Highest education level: Not on file  Occupational History   Not on file  Tobacco Use   Smoking status: Former    Packs/day: 0.50    Years: 12.00     Total pack years: 6.00    Types: Cigarettes    Quit date: 12/15/1991    Years since quitting: 30.9   Smokeless tobacco: Former    Types: Snuff   Tobacco comments:    "quit snuff in ~ 1981"  Vaping Use   Vaping Use: Never used  Substance and Sexual Activity   Alcohol use: Yes    Alcohol/week: 7.0 standard drinks of alcohol    Types: 7 Cans of beer per week    Comment: More social use   Drug use: No   Sexual activity: Yes  Other Topics Concern   Not on file  Social History Narrative   Not on file  Social Determinants of Health   Financial Resource Strain: Not on file  Food Insecurity: Not on file  Transportation Needs: Not on file  Physical Activity: Not on file  Stress: Not on file  Social Connections: Not on file     Family History: The patient's family history includes Diabetes Mellitus I in his sister; Heart attack in his father; Heart disease in his father, mother, and sister; Stroke in his brother and father. ROS:   Please see the history of present illness.    All 14 point review of systems negative except as described per history of present illness  EKGs/Labs/Other Studies Reviewed:      Recent Labs: No results found for requested labs within last 365 days.  Recent Lipid Panel    Component Value Date/Time   CHOL 125 12/07/2018 1520   TRIG 103 12/07/2018 1520   HDL 57 12/07/2018 1520   CHOLHDL 2.2 12/07/2018 1520   LDLCALC 47 12/07/2018 1520    Physical Exam:    VS:  BP (!) 148/82 (BP Location: Right Arm, Patient Position: Sitting)   Pulse 96   Ht '5\' 9"'$  (1.753 m)   Wt 245 lb (111.1 kg)   SpO2 97%   BMI 36.18 kg/m     Wt Readings from Last 3 Encounters:  11/30/22 245 lb (111.1 kg)  08/09/22 246 lb (111.6 kg)  06/23/22 249 lb (112.9 kg)     GEN:  Well nourished, well developed in no acute distress HEENT: Normal NECK: No JVD; No carotid bruits LYMPHATICS: No lymphadenopathy CARDIAC: RRR, no murmurs, no rubs, no gallops RESPIRATORY:  Clear  to auscultation without rales, wheezing or rhonchi  ABDOMEN: Soft, non-tender, non-distended MUSCULOSKELETAL:  No edema; No deformity  SKIN: Warm and dry LOWER EXTREMITIES: no swelling NEUROLOGIC:  Alert and oriented x 3 PSYCHIATRIC:  Normal affect   ASSESSMENT:    1. Atherosclerosis of native coronary artery of native heart without angina pectoris   2. Benign essential hypertension   3. Diabetes mellitus without complication ([place])   4. Bariatric surgery status    PLAN:    In order of problems listed above:  Coronary artery disease stable from that point review and appropriate medication which I will continue.  He is on antiplatelet therapy. Dyslipidemia I did review K PN which show me his LDL 46 HDL 49 this is from December 15 excellent control, he is on high intense statin 12 Lipitor 40 which I will continue. Dyslipidemia I did review his K PN which show me his hemoglobin A1c of 6.3.  He is on appropriate medication guidelines directed which I will continue. We did talk about healthy lifestyle need to exercise on the regular basis which he said he will try to do   Medication Adjustments/Labs and Tests Ordered: Current medicines are reviewed at length with the patient today.  Concerns regarding medicines are outlined above.  No orders of the defined types were placed in this encounter.  Medication changes: No orders of the defined types were placed in this encounter.   Signed, Park Liter, MD, [facility] 11/30/2022 8:46 AM    [place]

## 2022-12-14 ENCOUNTER — Other Ambulatory Visit: Payer: Self-pay | Admitting: Family Medicine

## 2023-01-09 ENCOUNTER — Other Ambulatory Visit: Payer: Self-pay | Admitting: Family Medicine

## 2023-06-06 ENCOUNTER — Ambulatory Visit: Payer: Commercial Managed Care - HMO | Attending: Cardiology | Admitting: Cardiology

## 2023-06-06 ENCOUNTER — Encounter: Payer: Self-pay | Admitting: Cardiology

## 2023-06-06 VITALS — BP 116/66 | HR 77 | Ht 69.5 in | Wt 240.0 lb

## 2023-06-06 DIAGNOSIS — I2510 Atherosclerotic heart disease of native coronary artery without angina pectoris: Secondary | ICD-10-CM

## 2023-06-06 DIAGNOSIS — Z9884 Bariatric surgery status: Secondary | ICD-10-CM | POA: Diagnosis not present

## 2023-06-06 DIAGNOSIS — I10 Essential (primary) hypertension: Secondary | ICD-10-CM | POA: Diagnosis not present

## 2023-06-06 DIAGNOSIS — E119 Type 2 diabetes mellitus without complications: Secondary | ICD-10-CM | POA: Diagnosis not present

## 2023-06-06 NOTE — Patient Instructions (Signed)

## 2023-06-06 NOTE — Progress Notes (Signed)
Cardiology Office Note:    Date:  06/06/2023   ID:  Tim Cook, DOB 28-Feb-1961, MRN [id number]  PCP:  Tim Lund, PA  Cardiologist:  Tim Balsam, MD    Referring MD: Tim Cook, Tim Cook   Chief Complaint  Patient presents with   Follow-up  Anxiety  History of Present Illness:    Tim Cook is a 62 y.o. male with past medical history significant for coronary disease.  In 2017 regular basis stenting of the proximal LAD as well as mid LAD for 90% stenosis.  Additional problem include essential hypertension, diabetes, dyslipidemia, sleep apnea.  Comes today to months for follow-up.  Overall doing very well.  Denies any chest pain tightness squeezing pressure in her chest.  She started will be more active with greater problem with his back issue..  Surgery but not present satisfied with it.  His diabetes seems to be well-controlled  Past Medical History:  Diagnosis Date   Acute lateral meniscal tear 06/09/2016   Arthritis    "knees, lower back, hands" (05/18/2016)   Atherosclerotic heart disease of native coronary artery without angina pectoris    05/18/16  Cath severe 90% LAD prox, 99% mid, Circ dominant with 30% OM1, 50% OM2, 30% OM3, 60% distal circ and PL, severe stenosis nondominant RCA.  EF 55%  3.0x 20, 2.5 x 20 mm, 2.25 x 16 mm Promus premier sent to LAD Dr. Clifton Cook    Bariatric surgery status 05/18/2016   Benign essential hypertension 01/28/2021   Bursitis of right shoulder 01/21/2019   CAD (coronary artery disease), native coronary artery    05/18/16  Cath severe 90% LAD prox, 99% mid, Circ dominant with 30% OM1, 50% OM2, 30% OM3, 60% distal circ and PL, severe stenosis nondominant RCA.  EF 55%  3.0x 20, 2.5 x 20 mm, 2.25 x 16 mm Promus premier sent to LAD Dr. Clifton Cook    Chronic bronchitis [facility])    "most years" (05/18/2016)   Chronic lower back pain    Closed fracture of right distal radius 03/12/2020   Closed fracture of right proximal humerus 03/12/2020    Controlled type 2 diabetes with retinopathy (HCC)    DDD (degenerative disc disease), lumbar    Degenerative arthritis of right knee 05/09/2017   Degenerative disc disease, lumbar 07/07/2016   Degenerative lumbar spinal stenosis 04/07/2021   Dislocation of left middle finger 01/21/2019   Encounter for immunization 01/28/2021   Erectile dysfunction 01/28/2021   History of nutritional deficiency 10/26/2021   Hyperglycemia due to type 2 diabetes mellitus (HCC) 01/28/2021   Hyperlipidemia    Hypertensive heart disease without CHF    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 type 2 diabetes mellitus (HCC) 01/28/2021   Mixed hyperlipidemia 05/18/2016   Nonallopathic lesion of lumbosacral region 07/07/2016   Nonallopathic lesion of sacral region 07/07/2016   Nonallopathic lesion of thoracic region 07/07/2016   Obesity    Shoulder joint pain 01/28/2021   Sleep apnea    "dx'd; never wore mask" (05/18/2016)   Squamous carcinoma    cut off right upper arm, left shoulder; froze off nose and right side of face   Type II diabetes mellitus (HCC)    Unstable angina (HCC)    Vitamin D deficiency 01/28/2021   Vomiting     Past Surgical History:  Procedure Laterality Date   CARDIAC CATHETERIZATION N/A 05/18/2016   Procedure: Left Heart Cath and Coronary Angiography;  Surgeon: Kathleene Hazel, MD;  Location: [facility] IN[facility] LAB;  Service: Cardiovascular;  Laterality: N/A;   CARDIAC CATHETERIZATION N/A 05/18/2016   Procedure: Coronary Stent Intervention;  Surgeon: Kathleene Hazel, MD;  [facility]: [facility];  Service: Cardiovascular;  Laterality: N/A;   CARPAL TUNNEL RELEASE Right 03/17/2020   Procedure: CARPAL TUNNEL RELEASE;  Surgeon: Teryl Lucy, MD;  Location: [facility];  Service: Orthopedics;  Laterality: Right;   CORONARY ANGIOPLASTY     FOOT FRACTURE SURGERY Right 1970s   LAPAROSCOPIC GASTRIC BANDING  07/2007   by Dr. Daphine Deutscher   ORIF HUMERUS FRACTURE  Right 03/17/2020   Procedure: OPEN REDUCTION INTERNAL FIXATION (ORIF) PROXIMAL HUMERUS FRACTURE AND DISTAL RADIUS FRACTURE;  Surgeon: Teryl Lucy, MD;  Location: [facility];  Service: Orthopedics;  Laterality: Right;   PILONIDAL CYST EXCISION  ~ 1985   "took 8 out all at once"   SQUAMOUS CELL CARCINOMA EXCISION     right upper arm, left shoulder    Current Medications: Current Meds  Medication Sig   aspirin EC 81 MG tablet Take 81 mg by mouth daily.   atorvastatin (LIPITOR) 40 MG tablet Take 40 mg by mouth daily.   Dulaglutide (TRULICITY [place]) Inject 0.5 mg into the skin once a week.   FARXIGA 10 MG TABS tablet Take 10 mg by mouth daily.    gabapentin (NEURONTIN) 100 MG capsule TAKE 2 CAPSULES BY MOUTH AT BEDTIME (Patient taking differently: Take 100 mg by mouth at bedtime.)   HYDROcodone-acetaminophen (NORCO) 10-325 MG tablet Take 1 tablet by mouth as needed for pain.   loratadine (CLARITIN) 10 MG tablet Take 10 mg by mouth daily.   losartan (COZAAR) 100 MG tablet Take 100 mg by mouth daily.   metFORMIN (GLUCOPHAGE) 500 MG tablet Take 1,000 mg by mouth 2 (two) times daily with a meal.   Multiple Vitamin (MULTIVITAMIN) capsule Take 1 capsule by mouth daily. Unknown strength   naproxen sodium (ALEVE) 220 MG tablet Take 220 mg by mouth daily as needed (pain).   nitroGLYCERIN (NITROSTAT) 0.4 MG SL tablet Place 1 tablet (0.4 mg total) under the tongue every 5 (five) minutes as needed for chest pain.   tiZANidine (ZANAFLEX) 2 MG tablet TAKE 1 TABLET BY MOUTH EVERYDAY AT BEDTIME (Patient taking differently: Take 2 mg by mouth at bedtime.)   Vitamin D, Ergocalciferol, (DRISDOL) 1.25 MG (50000 UNIT) CAPS capsule TAKE 1 CAPSULE EVERY 7 DAYS (Patient taking differently: Take 50,000 Units by mouth every 7 (seven) days.)   [DISCONTINUED] tirzepatide (MOUNJARO) 7.5 MG/0.5ML Pen Inject 7.5 mg into the skin once a week.     Allergies:   Patient has no known allergies.   Social History   Socioeconomic  History   Marital status: Married    Spouse name: Not on file   Number of children: Not on file   Years of education: Not on file   Highest education level: Not on file  Occupational History   Not on file  Tobacco Use   Smoking status: Former    Current packs/day: 0.00    Average packs/day: 0.5 packs/day for 12.0 years (6.0 ttl pk-yrs)    Types: Cigarettes    Start date: 12/15/1979    Quit date: 12/15/1991    Years since quitting: 31.4   Smokeless tobacco: Former    Types: Snuff   Tobacco comments:    "quit snuff in ~ 1981"  Vaping Use   Vaping status: Never Used  Substance and Sexual Activity   Alcohol use: Yes  Alcohol/week: 7.0 standard drinks of alcohol    Types: 7 Cans of beer per week    Comment: More social use   Drug use: No   Sexual activity: Yes  Other Topics Concern   Not on file  Social History Narrative   Not on file   Social Determinants of Health   Financial Resource Strain: Not on file  Food Insecurity: Not on file  Transportation Needs: Not on file  Physical Activity: Not on file  Stress: Not on file  Social Connections: Not on file     Family History: The patient's family history includes Diabetes Mellitus I in his sister; Heart attack in his father; Heart disease in his father, mother, and sister; Stroke in his brother and father. ROS:   Please see the history of present illness.    All 14 point review of systems negative except as described per history of present illness  EKGs/Labs/Other Studies Reviewed:         Recent Labs: No results found for requested labs within last 365 days.  Recent Lipid Panel    Component Value Date/Time   CHOL 125 12/07/2018 1520   TRIG 103 12/07/2018 1520   HDL 57 12/07/2018 1520   CHOLHDL 2.2 12/07/2018 1520   LDLCALC 47 12/07/2018 1520    Physical Exam:    VS:  BP 116/66 (BP Location: Left Arm, Patient Position: Sitting)   Pulse 77   Ht 5' 9.5" (1.765 m)   Wt 240 lb (108.9 kg)   SpO2 93%   BMI  34.93 kg/m     Wt Readings from Last 3 Encounters:  06/06/23 240 lb (108.9 kg)  11/30/22 245 lb (111.1 kg)  08/09/22 246 lb (111.6 kg)     GEN:  Well nourished, well developed in no acute distress HEENT: Normal NECK: No JVD; No carotid bruits LYMPHATICS: No lymphadenopathy CARDIAC: RRR, no murmurs, no rubs, no gallops RESPIRATORY:  Clear to auscultation without rales, wheezing or rhonchi  ABDOMEN: Soft, non-tender, non-distended MUSCULOSKELETAL:  No edema; No deformity  SKIN: Warm and dry LOWER EXTREMITIES: no swelling NEUROLOGIC:  Alert and oriented x 3 PSYCHIATRIC:  Normal affect   ASSESSMENT:    1. Atherosclerosis of native coronary artery of native heart without angina pectoris   2. Benign essential hypertension   3. Diabetes mellitus without complication (HCC)   4. Bariatric surgery status    PLAN:    In order of problems listed above:  Coronary artery disease stable from medical Probably done as directed medical therapy. Benign essential hypertension blood pressure seems to well-controlled continue present management. Dyslipidemia I did review K PN in the EMR LDL 52 HDL 50 continue present management with Lipitor 40 mg daily which is high intense statin. Diabetes I did review K PN which show me his hemoglobin A1c is 6.1 this is from July of this year.  Well-controlled. Overall Zyree is doing well we will continue present management   Medication Adjustments/Labs and Tests Ordered: Current medicines are reviewed at length with the patient today.  Concerns regarding medicines are outlined above.  No orders of the defined types were placed in this encounter.  Medication changes: No orders of the defined types were placed in this encounter.   Signed, Georgeanna Lea, MD, [facility] 06/06/2023 8:37 AM    [facility]

## 2023-06-14 NOTE — Progress Notes (Unsigned)
[name] [address] [place] [address] Phone: [phone] Subjective:   Tim Cook, am serving as a scribe for Dr. Antoine Primas.  I'm seeing this patient by the request  of:  Wilfrid Lund, PA  CC: Low back pain follow-up  [id number]  Tim Cook is a 62 y.o. male coming in with complaint of back and neck pain. OMT on 08/09/2022. Patient states that his lower back pain has increased. Using zanaflex and gabapetin prn. Trying to sleep on firmer mattress.   Medications patient has been prescribed: gabapentin zanaflex  Taking:         Reviewed prior external information including notes and imaging from previsou exam, outside providers and external EMR if available.   As well as notes that were available from care everywhere and other healthcare systems.  Past medical history, social, surgical and family history all reviewed in electronic medical record.  No pertanent information unless stated regarding to the chief complaint.   Past Medical History:  Diagnosis Date   Acute lateral meniscal tear 06/09/2016   Arthritis    "knees, lower back, hands" (05/18/2016)   Atherosclerotic heart disease of native coronary artery without angina pectoris    05/18/16  Cath severe 90% LAD prox, 99% mid, Circ dominant with 30% OM1, 50% OM2, 30% OM3, 60% distal circ and PL, severe stenosis nondominant RCA.  EF 55%  3.0x 20, 2.5 x 20 mm, 2.25 x 16 mm Promus premier sent to LAD Dr. Clifton James    Bariatric surgery status 05/18/2016   Benign essential hypertension 01/28/2021   Bursitis of right shoulder 01/21/2019   CAD (coronary artery disease), native coronary artery    05/18/16  Cath severe 90% LAD prox, 99% mid, Circ dominant with 30% OM1, 50% OM2, 30% OM3, 60% distal circ and PL, severe stenosis nondominant RCA.  EF 55%  3.0x 20, 2.5 x 20 mm, 2.25 x 16 mm Promus premier sent to LAD Dr. Clifton James    Chronic bronchitis [facility])    "most years"  (05/18/2016)   Chronic lower back pain    Closed fracture of right distal radius 03/12/2020   Closed fracture of right proximal humerus 03/12/2020   Controlled type 2 diabetes with retinopathy (HCC)    DDD (degenerative disc disease), lumbar    Degenerative arthritis of right knee 05/09/2017   Degenerative disc disease, lumbar 07/07/2016   Degenerative lumbar spinal stenosis 04/07/2021   Dislocation of left middle finger 01/21/2019   Encounter for immunization 01/28/2021   Erectile dysfunction 01/28/2021   History of nutritional deficiency 10/26/2021   Hyperglycemia due to type 2 diabetes mellitus (HCC) 01/28/2021   Hyperlipidemia    Hypertensive heart disease without CHF    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 type 2 diabetes mellitus (HCC) 01/28/2021   Mixed hyperlipidemia 05/18/2016   Nonallopathic lesion of lumbosacral region 07/07/2016   Nonallopathic lesion of sacral region 07/07/2016   Nonallopathic lesion of thoracic region 07/07/2016   Obesity    Shoulder joint pain 01/28/2021   Sleep apnea    "dx'd; never wore mask" (05/18/2016)   Squamous carcinoma    cut off right upper arm, left shoulder; froze off nose and right side of face   Type II diabetes mellitus (HCC)    Unstable angina (HCC)    Vitamin D deficiency 01/28/2021   Vomiting     No Known Allergies   Review of Systems:  No headache, visual changes, nausea,  vomiting, diarrhea, constipation, dizziness, abdominal pain, skin rash, fevers, chills, night sweats, weight loss, swollen lymph nodes, body aches, joint swelling, chest pain, shortness of breath, mood changes. POSITIVE muscle aches  Objective  Blood pressure 110/72, pulse 81, height 5' 9.5" (1.765 m), weight 243 lb (110.2 kg), SpO2 97%.   General: No apparent distress alert and oriented x3 mood and affect normal, dressed appropriately.  HEENT: Pupils equal, extraocular movements intact  Respiratory: Patient's speak in full sentences  and does not appear short of breath  Cardiovascular: No lower extremity edema, non tender, no erythema  Low back does have significant loss of lordosis noted.  Some tenderness to palpation of the paraspinal musculature.   Osteopathic findings  C6 flexed rotated and side bent left T3 extended rotated and side bent right inhaled rib T8 extended rotated and side bent left L2 flexed rotated and side bent right L5 flexed rotated and side bent left Sacrum right on right     Assessment and Plan:  Chronic lower back pain Seems to be more on chronic low back pain.  Discussed with patient about icing regimen and home exercises.  Toradol and Depo-Medrol given today to help with some of the pain as well.  Can refill gabapentin if needed.  Continue to work on core strengthening.  Patient feels like he may be able to get new insurance which will allow him to come on a more regular basis.  Follow-up again in 2 to 3 months.    Nonallopathic problems  Decision today to treat with OMT was based on Physical Exam  After verbal consent patient was treated with HVLA, ME, FPR techniques in cervical, rib, thoracic, lumbar, and sacral  areas  Patient tolerated the procedure well with improvement in symptoms  Patient given exercises, stretches and lifestyle modifications  See medications in patient instructions if given  Patient will follow up in 4-8 weeks    The above documentation has been reviewed and is accurate and complete Judi Saa, DO          Note: This dictation was prepared with Dragon dictation along with smaller phrase technology. Any transcriptional errors that result from this process are unintentional.

## 2023-06-15 ENCOUNTER — Encounter: Payer: Self-pay | Admitting: Family Medicine

## 2023-06-15 ENCOUNTER — Ambulatory Visit: Payer: Commercial Managed Care - HMO | Admitting: Family Medicine

## 2023-06-15 VITALS — BP 110/72 | HR 81 | Ht 69.5 in | Wt 243.0 lb

## 2023-06-15 DIAGNOSIS — M9908 Segmental and somatic dysfunction of rib cage: Secondary | ICD-10-CM

## 2023-06-15 DIAGNOSIS — M9903 Segmental and somatic dysfunction of lumbar region: Secondary | ICD-10-CM

## 2023-06-15 DIAGNOSIS — G8929 Other chronic pain: Secondary | ICD-10-CM | POA: Diagnosis not present

## 2023-06-15 DIAGNOSIS — M9902 Segmental and somatic dysfunction of thoracic region: Secondary | ICD-10-CM

## 2023-06-15 DIAGNOSIS — M9904 Segmental and somatic dysfunction of sacral region: Secondary | ICD-10-CM

## 2023-06-15 DIAGNOSIS — M545 Low back pain, unspecified: Secondary | ICD-10-CM

## 2023-06-15 DIAGNOSIS — M9901 Segmental and somatic dysfunction of cervical region: Secondary | ICD-10-CM | POA: Diagnosis not present

## 2023-06-15 NOTE — Assessment & Plan Note (Signed)
Seems to be more on chronic low back pain.  Discussed with patient about icing regimen and home exercises.  Toradol and Depo-Medrol given today to help with some of the pain as well.  Can refill gabapentin if needed.  Continue to work on core strengthening.  Patient feels like he may be able to get new insurance which will allow him to come on a more regular basis.  Follow-up again in 2 to 3 months.

## 2023-06-15 NOTE — Patient Instructions (Signed)
Injections in backside See me in 6-8 weeks

## 2023-06-26 ENCOUNTER — Other Ambulatory Visit: Payer: Self-pay | Admitting: Family Medicine

## 2023-06-30 DIAGNOSIS — E113293 Type 2 diabetes mellitus with mild nonproliferative diabetic retinopathy without macular edema, bilateral: Secondary | ICD-10-CM | POA: Diagnosis not present

## 2023-07-19 DIAGNOSIS — Z23 Encounter for immunization: Secondary | ICD-10-CM | POA: Diagnosis not present

## 2023-07-21 ENCOUNTER — Other Ambulatory Visit: Payer: Self-pay | Admitting: Cardiology

## 2023-07-26 NOTE — Progress Notes (Unsigned)
[name] [address] [place] [address] Phone: [phone] Subjective:   INadine Counts, am serving as a scribe for Dr. Antoine Primas.  I'm seeing this patient by the request  of:  Wilfrid Lund, PA  CC: Low back pain follow-up  [id number]  Tim Cook is a 62 y.o. male coming in with complaint of back and neck pain. OMT 06/15/2023. Patient states same per usual. No new concerns.  Patient has had significant amount of discomfort over the course of time.  Describes the pain as a dull, throbbing aching sensation.  Feels like it is tight enough to the point where patient did have to discontinue certain activities and would have to stay in bed for nearly 2 days.  Medications patient has been prescribed: None :         Reviewed prior external information including notes and imaging from previsou exam, outside providers and external EMR if available.   As well as notes that were available from care everywhere and other healthcare systems.  Past medical history, social, surgical and family history all reviewed in electronic medical record.  No pertanent information unless stated regarding to the chief complaint.   Past Medical History:  Diagnosis Date   Acute lateral meniscal tear 06/09/2016   Arthritis    "knees, lower back, hands" (05/18/2016)   Atherosclerotic heart disease of native coronary artery without angina pectoris    05/18/16  Cath severe 90% LAD prox, 99% mid, Circ dominant with 30% OM1, 50% OM2, 30% OM3, 60% distal circ and PL, severe stenosis nondominant RCA.  EF 55%  3.0x 20, 2.5 x 20 mm, 2.25 x 16 mm Promus premier sent to LAD Dr. Clifton James    Bariatric surgery status 05/18/2016   Benign essential hypertension 01/28/2021   Bursitis of right shoulder 01/21/2019   CAD (coronary artery disease), native coronary artery    05/18/16  Cath severe 90% LAD prox, 99% mid, Circ dominant with 30% OM1, 50% OM2, 30% OM3, 60% distal circ and  PL, severe stenosis nondominant RCA.  EF 55%  3.0x 20, 2.5 x 20 mm, 2.25 x 16 mm Promus premier sent to LAD Dr. Clifton James    Chronic bronchitis [facility])    "most years" (05/18/2016)   Chronic lower back pain    Closed fracture of right distal radius 03/12/2020   Closed fracture of right proximal humerus 03/12/2020   Controlled type 2 diabetes with retinopathy (HCC)    DDD (degenerative disc disease), lumbar    Degenerative arthritis of right knee 05/09/2017   Degenerative disc disease, lumbar 07/07/2016   Degenerative lumbar spinal stenosis 04/07/2021   Dislocation of left middle finger 01/21/2019   Encounter for immunization 01/28/2021   Erectile dysfunction 01/28/2021   History of nutritional deficiency 10/26/2021   Hyperglycemia due to type 2 diabetes mellitus (HCC) 01/28/2021   Hyperlipidemia    Hypertensive heart disease without CHF    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 type 2 diabetes mellitus (HCC) 01/28/2021   Mixed hyperlipidemia 05/18/2016   Nonallopathic lesion of lumbosacral region 07/07/2016   Nonallopathic lesion of sacral region 07/07/2016   Nonallopathic lesion of thoracic region 07/07/2016   Obesity    Shoulder joint pain 01/28/2021   Sleep apnea    "dx'd; never wore mask" (05/18/2016)   Squamous carcinoma    cut off right upper arm, left shoulder; froze off nose and right side of face   Type II diabetes  mellitus (HCC)    Unstable angina (HCC)    Vitamin D deficiency 01/28/2021   Vomiting     No Known Allergies   Review of Systems:  No headache, visual changes, nausea, vomiting, diarrhea, constipation, dizziness, abdominal pain, skin rash, fevers, chills, night sweats, weight loss, swollen lymph nodes, body aches, joint swelling, chest pain, shortness of breath, mood changes. POSITIVE muscle aches  Objective  Blood pressure 122/68, pulse 93, height 5\' 9"  (1.753 m), weight 243 lb (110.2 kg), SpO2 98%.   General: No apparent distress  alert and oriented x3 mood and affect normal, dressed appropriately.  HEENT: Pupils equal, extraocular movements intact  Respiratory: Patient's speak in full sentences and does not appear short of breath  Cardiovascular: No lower extremity edema, non tender, no erythema  Gait MSK:  Back does have some loss of lordosis noted.  Patient does have poor core strength noted.  Significant tightness of the paraspinal musculature especially at the thoracolumbar junction.  Tightness also noted at the site iliac joints bilaterally lacks the last 10 degrees of extension of the back.  Lacks a 25 degrees of flexion.  Difficulty with FABER test.  Osteopathic findings  C2 flexed rotated and side bent right C6 flexed rotated and side bent left C7 flexed rotated and side bent left T3 extended rotated and side bent right inhaled rib T9 extended rotated and side bent left L2 flexed rotated and side bent right L3 flexed rotated and side bent right L5 flexed rotated and side bent left Sacrum right on right       Assessment and Plan:  Radiculopathy, lumbar region Chronic problem with increasing tightness again that is likely secondary to the spinal stenosis as well as some of the muscle imbalances.  We discussed with patient that icing regimen and home exercises, Toradol and Depo-Medrol given today.  Encourage patient to take the gabapentin on a more regular basis.  Follow-up again in 6 to 8 weeks otherwise Toradol and Depo-Medrol given today for exacerbation  Nonallopathic problems  Decision today to treat with OMT was based on Physical Exam  After verbal consent patient was treated with HVLA, ME, FPR techniques in cervical, rib, thoracic, lumbar, and sacral  areas  Patient tolerated the procedure well with improvement in symptoms  Patient given exercises, stretches and lifestyle modifications  See medications in patient instructions if given  Patient will follow up in 4-8 weeks     The above  documentation has been reviewed and is accurate and complete Judi Saa, DO         Note: This dictation was prepared with Dragon dictation along with smaller phrase technology. Any transcriptional errors that result from this process are unintentional.

## 2023-07-27 ENCOUNTER — Ambulatory Visit: Payer: Medicare Other | Admitting: Family Medicine

## 2023-07-27 ENCOUNTER — Encounter: Payer: Self-pay | Admitting: Family Medicine

## 2023-07-27 VITALS — BP 122/68 | HR 93 | Ht 69.0 in | Wt 243.0 lb

## 2023-07-27 DIAGNOSIS — M9902 Segmental and somatic dysfunction of thoracic region: Secondary | ICD-10-CM | POA: Diagnosis not present

## 2023-07-27 DIAGNOSIS — M5416 Radiculopathy, lumbar region: Secondary | ICD-10-CM

## 2023-07-27 DIAGNOSIS — M545 Low back pain, unspecified: Secondary | ICD-10-CM

## 2023-07-27 DIAGNOSIS — G8929 Other chronic pain: Secondary | ICD-10-CM | POA: Diagnosis not present

## 2023-07-27 DIAGNOSIS — M9901 Segmental and somatic dysfunction of cervical region: Secondary | ICD-10-CM

## 2023-07-27 DIAGNOSIS — M9904 Segmental and somatic dysfunction of sacral region: Secondary | ICD-10-CM | POA: Diagnosis not present

## 2023-07-27 DIAGNOSIS — M9903 Segmental and somatic dysfunction of lumbar region: Secondary | ICD-10-CM

## 2023-07-27 MED ORDER — METHYLPREDNISOLONE ACETATE 80 MG/ML IJ SUSP
80.0000 mg | Freq: Once | INTRAMUSCULAR | Status: AC
  Administered 2023-07-27: 80 mg via INTRAMUSCULAR

## 2023-07-27 MED ORDER — KETOROLAC TROMETHAMINE 60 MG/2ML IM SOLN
60.0000 mg | Freq: Once | INTRAMUSCULAR | Status: AC
  Administered 2023-07-27: 60 mg via INTRAMUSCULAR

## 2023-07-27 NOTE — Patient Instructions (Addendum)
Cocktail injection today See you again in 6-8 weeks

## 2023-07-27 NOTE — Assessment & Plan Note (Signed)
Chronic problem with increasing tightness again that is likely secondary to the spinal stenosis as well as some of the muscle imbalances.  We discussed with patient that icing regimen and home exercises, Toradol and Depo-Medrol given today.  Encourage patient to take the gabapentin on a more regular basis.  Follow-up again in 6 to 8 weeks otherwise

## 2023-07-28 DIAGNOSIS — R058 Other specified cough: Secondary | ICD-10-CM | POA: Diagnosis not present

## 2023-07-28 DIAGNOSIS — J301 Allergic rhinitis due to pollen: Secondary | ICD-10-CM | POA: Diagnosis not present

## 2023-08-10 ENCOUNTER — Telehealth: Payer: Self-pay | Admitting: Family Medicine

## 2023-08-10 ENCOUNTER — Other Ambulatory Visit: Payer: Self-pay

## 2023-08-10 MED ORDER — GABAPENTIN 100 MG PO CAPS: 200.0000 mg | ORAL_CAPSULE | Freq: Every day | ORAL | 1 refills | Status: DC

## 2023-08-10 NOTE — Telephone Encounter (Signed)
Rx filled

## 2023-08-10 NOTE — Telephone Encounter (Signed)
Pt requesting refill of Gabapentin. Please note I have changed his primary pharmacy to [facility].

## 2023-08-16 DIAGNOSIS — K080 Exfoliation of teeth due to systemic causes: Secondary | ICD-10-CM | POA: Diagnosis not present

## 2023-09-04 DIAGNOSIS — D225 Melanocytic nevi of trunk: Secondary | ICD-10-CM | POA: Diagnosis not present

## 2023-09-04 DIAGNOSIS — L723 Sebaceous cyst: Secondary | ICD-10-CM | POA: Diagnosis not present

## 2023-09-04 DIAGNOSIS — L570 Actinic keratosis: Secondary | ICD-10-CM | POA: Diagnosis not present

## 2023-09-04 DIAGNOSIS — L821 Other seborrheic keratosis: Secondary | ICD-10-CM | POA: Diagnosis not present

## 2023-09-04 DIAGNOSIS — C44712 Basal cell carcinoma of skin of right lower limb, including hip: Secondary | ICD-10-CM | POA: Diagnosis not present

## 2023-09-04 DIAGNOSIS — Z85828 Personal history of other malignant neoplasm of skin: Secondary | ICD-10-CM | POA: Diagnosis not present

## 2023-09-11 ENCOUNTER — Ambulatory Visit: Payer: Medicare Other | Admitting: Family Medicine

## 2023-09-29 NOTE — Progress Notes (Signed)
 [facility] [address] [place] [address] Phone: [phone] Subjective:   Tim Cook, am serving as a scribe for Dr. Arthea Claudene.  I'Tim seeing this patient by the request  of:  Tim Therisa MATSU, PA  CC: back and neck pain follow up   YEP:[id number]  Tim Cook is a 63 y.o. male coming in with complaint of back and neck pain. OMT on 07/27/2023. Patient states ready for OMT, tight since traveling for the holidays    Medications patient has been prescribed: gabapentin   Taking:         Reviewed prior external information including notes and imaging from previsou exam, outside providers and external EMR if available.   As well as notes that were available from care everywhere and other healthcare systems.  Past medical history, social, surgical and family history all reviewed in electronic medical record.  No pertanent information unless stated regarding to the chief complaint.   Past Medical History:  Diagnosis Date   Acute lateral meniscal tear 06/09/2016   Arthritis    knees, lower back, hands (05/18/2016)   Atherosclerotic heart disease of native coronary artery without angina pectoris    05/18/16  Cath severe 90% LAD prox, 99% mid, Circ dominant with 30% OM1, 50% OM2, 30% OM3, 60% distal circ and PL, severe stenosis nondominant RCA.  EF 55%  3.0x 20, 2.5 x 20 mm, 2.25 x 16 mm Promus premier sent to L[facility] Dr. Verlin    Bariatric surgery status 05/18/2016   Benign essential hypertension 01/28/2021   Bursitis of right shoulder 01/21/2019   CAD (coronary artery disease), native coronary artery    05/18/16  Cath severe 90% LAD prox, 99% mid, Circ dominant with 30% OM1, 50% OM2, 30% OM3, 60% distal circ and PL, severe stenosis nondominant RCA.  EF 55%  3.0x 20, 2.5 x 20 mm, 2.25 x 16 mm Promus premier sent to LAD Dr. Verlin    Chronic bronchitis (HCC)    most years (05/18/2016)   Chronic lower back pain    Closed fracture of  right distal radius 03/12/2020   Closed fracture of right proximal humerus 03/12/2020   Controlled type 2 diabetes with retinopathy (HCC)    DDD (degenerative disc disease), lumbar    Degenerative arthritis of right knee 05/09/2017   Degenerative disc disease, lumbar 07/07/2016   Degenerative lumbar spinal stenosis 04/07/2021   Dislocation of left middle finger 01/21/2019   Encounter for immunization 01/28/2021   Erectile dysfunction 01/28/2021   History of nutritional deficiency 10/26/2021   Hyperglycemia due to type 2 diabetes mellitus (HCC) 01/28/2021   Hyperlipidemia    Hypertensive heart disease without CHF    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 type 2 diabetes mellitus (HCC) 01/28/2021   Mixed hyperlipidemia 05/18/2016   Nonallopathic lesion of lumbosacral region 07/07/2016   Nonallopathic lesion of sacral region 07/07/2016   Nonallopathic lesion of thoracic region 07/07/2016   Obesity    Shoulder joint pain 01/28/2021   Sleep apnea    dx'd; never wore mask (05/18/2016)   Squamous carcinoma    cut off right upper arm, left shoulder; froze off nose and right side of face   Type II diabetes mellitus (HCC)    Unstable angina (HCC)    Vitamin D  deficiency 01/28/2021   Vomiting     No Known Allergies   Review of Systems:  No headache, visual changes, nausea, vomiting, diarrhea, constipation, dizziness, abdominal pain,  skin rash, fevers, chills, night sweats, weight loss, swollen lymph nodes, body aches, joint swelling, chest pain, shortness of breath, mood changes. POSITIVE muscle aches  Objective  Blood pressure 120/60, pulse 85, height 5' 9 (1.753 Tim), SpO2 98%.   General: No apparent distress alert and oriented x3 mood and affect normal, dressed appropriately.  HEENT: Pupils equal, extraocular movements intact  Respiratory: Patient's speak in full sentences and does not appear short of breath  Cardiovascular: No lower extremity edema, non  tender, no erythema  Gait MSK:  Back loss of lordosis, tightness noted negative SLT  In the right neurovascular intact distally.   Osteopathic findings  C3 flexed rotated and side bent right C7 flexed rotated and side bent left T3 extended rotated and side bent right inhaled rib T9 extended rotated and side bent left L3 flexed rotated and side bent right Sacrum right on right     Assessment and Plan:  DDD (degenerative disc disease), lumbar Chronic low back pain.  Exacerbation noted.  Toradol  and Depo-Medrol  given today.  Discussed icing regimen and home exercises, increase activity slowly otherwise.  Follow-up again in 6 to 8 weeks.    Nonallopathic problems  Decision today to treat with OMT was based on Physical Exam  After verbal consent patient was treated with HVLA, ME, FPR techniques in cervical, rib, thoracic, lumbar, and sacral  areas  Patient tolerated the procedure well with improvement in symptoms  Patient given exercises, stretches and lifestyle modifications  See medications in patient instructions if given  Patient will follow up in 4-8 weeks    The above documentation has been reviewed and is accurate and complete Tim Cook Tim Rishav Rockefeller, DO          Note: This dictation was prepared with Dragon dictation along with smaller phrase technology. Any transcriptional errors that result from this process are unintentional.

## 2023-10-05 ENCOUNTER — Ambulatory Visit: Payer: Medicare Other | Admitting: Family Medicine

## 2023-10-05 ENCOUNTER — Encounter: Payer: Self-pay | Admitting: Family Medicine

## 2023-10-05 VITALS — BP 120/60 | HR 85 | Ht 69.0 in

## 2023-10-05 DIAGNOSIS — M9908 Segmental and somatic dysfunction of rib cage: Secondary | ICD-10-CM

## 2023-10-05 DIAGNOSIS — I2510 Atherosclerotic heart disease of native coronary artery without angina pectoris: Secondary | ICD-10-CM | POA: Diagnosis not present

## 2023-10-05 DIAGNOSIS — M9904 Segmental and somatic dysfunction of sacral region: Secondary | ICD-10-CM | POA: Diagnosis not present

## 2023-10-05 DIAGNOSIS — E782 Mixed hyperlipidemia: Secondary | ICD-10-CM | POA: Diagnosis not present

## 2023-10-05 DIAGNOSIS — M9901 Segmental and somatic dysfunction of cervical region: Secondary | ICD-10-CM

## 2023-10-05 DIAGNOSIS — E1169 Type 2 diabetes mellitus with other specified complication: Secondary | ICD-10-CM | POA: Diagnosis not present

## 2023-10-05 DIAGNOSIS — M9903 Segmental and somatic dysfunction of lumbar region: Secondary | ICD-10-CM

## 2023-10-05 DIAGNOSIS — M9902 Segmental and somatic dysfunction of thoracic region: Secondary | ICD-10-CM | POA: Diagnosis not present

## 2023-10-05 DIAGNOSIS — M5136 Other intervertebral disc degeneration, lumbar region with discogenic back pain only: Secondary | ICD-10-CM | POA: Diagnosis not present

## 2023-10-05 DIAGNOSIS — I10 Essential (primary) hypertension: Secondary | ICD-10-CM | POA: Diagnosis not present

## 2023-10-05 MED ORDER — KETOROLAC TROMETHAMINE 30 MG/ML IJ SOLN
30.0000 mg | Freq: Once | INTRAMUSCULAR | Status: AC
  Administered 2023-10-05: 30 mg via INTRAMUSCULAR

## 2023-10-05 MED ORDER — METHYLPREDNISOLONE ACETATE 40 MG/ML IJ SUSP
40.0000 mg | Freq: Once | INTRAMUSCULAR | Status: AC
  Administered 2023-10-05: 40 mg via INTRAMUSCULAR

## 2023-10-05 NOTE — Assessment & Plan Note (Signed)
 Chronic low back pain.  Exacerbation noted.  Toradol and Depo-Medrol given today.  Discussed icing regimen and home exercises, increase activity slowly otherwise.  Follow-up again in 6 to 8 weeks.

## 2023-10-05 NOTE — Patient Instructions (Signed)
 Good to see you  Injection in backside today  Follow up in 3 months

## 2023-10-06 ENCOUNTER — Encounter: Payer: Self-pay | Admitting: Family Medicine

## 2023-10-25 DIAGNOSIS — E113393 Type 2 diabetes mellitus with moderate nonproliferative diabetic retinopathy without macular edema, bilateral: Secondary | ICD-10-CM | POA: Diagnosis not present

## 2023-12-07 NOTE — Progress Notes (Signed)
 [name] [address] [place] [address] Phone: [phone] Subjective:   INadine Counts, am serving as a scribe for Dr. Antoine Primas.  I'm seeing this patient by the request  of:  Wilfrid Lund, PA  CC: back and neck pain follow up   [id number]  Tim Cook is a 63 y.o. male coming in with complaint of back and neck pain. OMT 10/05/2023. Patient states no big changes. Wants to talk about gabapentin usage.  Medications patient has been prescribed: Gabapentin, Zanaflex  Taking:         Reviewed prior external information including notes and imaging from previsou exam, outside providers and external EMR if available.   As well as notes that were available from care everywhere and other healthcare systems.  Past medical history, social, surgical and family history all reviewed in electronic medical record.  No pertanent information unless stated regarding to the chief complaint.   Past Medical History:  Diagnosis Date   Acute lateral meniscal tear 06/09/2016   Arthritis    "knees, lower back, hands" (05/18/2016)   Atherosclerotic heart disease of native coronary artery without angina pectoris    05/18/16  Cath severe 90% LAD prox, 99% mid, Circ dominant with 30% OM1, 50% OM2, 30% OM3, 60% distal circ and PL, severe stenosis nondominant RCA.  EF 55%  3.0x 20, 2.5 x 20 mm, 2.25 x 16 mm Promus premier sent to LAD Dr. Clifton James    Bariatric surgery status 05/18/2016   Benign essential hypertension 01/28/2021   Bursitis of right shoulder 01/21/2019   CAD (coronary artery disease), native coronary artery    05/18/16  Cath severe 90% LAD prox, 99% mid, Circ dominant with 30% OM1, 50% OM2, 30% OM3, 60% distal circ and PL, severe stenosis nondominant RCA.  EF 55%  3.0x 20, 2.5 x 20 mm, 2.25 x 16 mm Promus premier sent to LAD Dr. Clifton James    Chronic bronchitis [facility])    "most years" (05/18/2016)   Chronic lower back pain    Closed fracture of  right distal radius 03/12/2020   Closed fracture of right proximal humerus 03/12/2020   Controlled type 2 diabetes with retinopathy (HCC)    DDD (degenerative disc disease), lumbar    Degenerative arthritis of right knee 05/09/2017   Degenerative disc disease, lumbar 07/07/2016   Degenerative lumbar spinal stenosis 04/07/2021   Dislocation of left middle finger 01/21/2019   Encounter for immunization 01/28/2021   Erectile dysfunction 01/28/2021   History of nutritional deficiency 10/26/2021   Hyperglycemia due to type 2 diabetes mellitus (HCC) 01/28/2021   Hyperlipidemia    Hypertensive heart disease without CHF    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 type 2 diabetes mellitus (HCC) 01/28/2021   Mixed hyperlipidemia 05/18/2016   Nonallopathic lesion of lumbosacral region 07/07/2016   Nonallopathic lesion of sacral region 07/07/2016   Nonallopathic lesion of thoracic region 07/07/2016   Obesity    Shoulder joint pain 01/28/2021   Sleep apnea    "dx'd; never wore mask" (05/18/2016)   Squamous carcinoma    cut off right upper arm, left shoulder; froze off nose and right side of face   Type II diabetes mellitus (HCC)    Unstable angina (HCC)    Vitamin D deficiency 01/28/2021   Vomiting     No Known Allergies   Review of Systems:  No headache, visual changes, nausea, vomiting, diarrhea, constipation, dizziness, abdominal pain, skin rash,  fevers, chills, night sweats, weight loss, swollen lymph nodes, body aches, joint swelling, chest pain, shortness of breath, mood changes. POSITIVE muscle aches  Objective  Blood pressure 120/74, pulse (!) 111, height 5\' 9"  (1.753 m), weight 245 lb (111.1 kg), SpO2 97%.   General: No apparent distress alert and oriented x3 mood and affect normal, dressed appropriately.  HEENT: Pupils equal, extraocular movements intact  Respiratory: Patient's speak in full sentences and does not appear short of breath  Cardiovascular: No  lower extremity edema, non tender, no erythema  MSK:  Back does have some loss lordosis.  Some poor posturing noted.  Some tenderness to palpation of the paraspinal musculature.  Osteopathic findings  C2 flexed rotated and side bent right T3 extended rotated and side bent right inhaled rib T6 extended rotated and side bent left L1 flexed rotated and side bent right L3 flexed rotated and side bent left Sacrum right on right       Assessment and Plan:  Radiculopathy, lumbar region Known spinal stenosis.  Discussed icing regimen and home exercises, discussed which activities to do and which ones to avoid.  Increase activity slowly.  Needs to continue to work on core strengthening.  Discussed icing regimen, home exercises, which activities to do and which ones to avoid.  Increase activity slowly.  Follow-up again in 6 to 8 weeks otherwise. Toradol and Depo-Medrol injections given today.  Nonallopathic problems  Decision today to treat with OMT was based on Physical Exam  After verbal consent patient was treated with HVLA, ME, FPR techniques in cervical, rib, thoracic, lumbar, and sacral  areas  Patient tolerated the procedure well with improvement in symptoms  Patient given exercises, stretches and lifestyle modifications  See medications in patient instructions if given  Patient will follow up in 4-8 weeks     The above documentation has been reviewed and is accurate and complete Judi Saa, DO         Note: This dictation was prepared with Dragon dictation along with smaller phrase technology. Any transcriptional errors that result from this process are unintentional.

## 2023-12-12 ENCOUNTER — Encounter: Payer: Self-pay | Admitting: Family Medicine

## 2023-12-12 ENCOUNTER — Ambulatory Visit (INDEPENDENT_AMBULATORY_CARE_PROVIDER_SITE_OTHER): Payer: Medicare Other | Admitting: Family Medicine

## 2023-12-12 VITALS — BP 120/74 | HR 111 | Ht 69.0 in | Wt 245.0 lb

## 2023-12-12 DIAGNOSIS — G8929 Other chronic pain: Secondary | ICD-10-CM

## 2023-12-12 DIAGNOSIS — M9902 Segmental and somatic dysfunction of thoracic region: Secondary | ICD-10-CM | POA: Diagnosis not present

## 2023-12-12 DIAGNOSIS — M5416 Radiculopathy, lumbar region: Secondary | ICD-10-CM | POA: Diagnosis not present

## 2023-12-12 DIAGNOSIS — M9904 Segmental and somatic dysfunction of sacral region: Secondary | ICD-10-CM

## 2023-12-12 DIAGNOSIS — M545 Low back pain, unspecified: Secondary | ICD-10-CM | POA: Diagnosis not present

## 2023-12-12 DIAGNOSIS — M9901 Segmental and somatic dysfunction of cervical region: Secondary | ICD-10-CM | POA: Diagnosis not present

## 2023-12-12 DIAGNOSIS — M9908 Segmental and somatic dysfunction of rib cage: Secondary | ICD-10-CM

## 2023-12-12 DIAGNOSIS — M9903 Segmental and somatic dysfunction of lumbar region: Secondary | ICD-10-CM

## 2023-12-12 MED ORDER — KETOROLAC TROMETHAMINE 30 MG/ML IJ SOLN
30.0000 mg | Freq: Once | INTRAMUSCULAR | Status: AC
  Administered 2023-12-12: 30 mg via INTRAMUSCULAR

## 2023-12-12 MED ORDER — METHYLPREDNISOLONE ACETATE 40 MG/ML IJ SUSP
40.0000 mg | Freq: Once | INTRAMUSCULAR | Status: AC
  Administered 2023-12-12: 40 mg via INTRAMUSCULAR

## 2023-12-12 NOTE — Assessment & Plan Note (Signed)
 Known spinal stenosis.  Discussed icing regimen and home exercises, discussed which activities to do and which ones to avoid.  Increase activity slowly.  Needs to continue to work on core strengthening.  Discussed icing regimen, home exercises, which activities to do and which ones to avoid.  Increase activity slowly.  Follow-up again in 6 to 8 weeks otherwise.

## 2023-12-12 NOTE — Patient Instructions (Addendum)
 Good to see you! Cocktail injection today Gabapentin up to 400mg  at night is okay See you again in 2 months

## 2023-12-20 ENCOUNTER — Telehealth: Payer: Self-pay | Admitting: Cardiology

## 2023-12-20 NOTE — Telephone Encounter (Signed)
   Pt c/o of Chest Pain: STAT if active CP, including tightness, pressure, jaw pain, radiating pain to shoulder/upper arm/back, CP unrelieved by Nitro. Symptoms reported of SOB, nausea, vomiting, sweating.  1. Are you having CP right now? No     2. Are you experiencing any other symptoms (ex. SOB, nausea, vomiting, sweating)? No    3. Is your CP continuous or coming and going? continuous   4. Have you taken Nitroglycerin? Yes    5. How long have you been experiencing CP? Happened this past weekend. Patient had radiating pain from shoulder to chest    6. If NO CP at time of call then end call with telling Pt to call back or call 911 if Chest pain returns prior to return call from triage team.

## 2023-12-20 NOTE — Telephone Encounter (Signed)
 Spoke with pt who states that he had an episode of chest pain last weekend and had mild relief with 1 NTG. Pt states that the pain was a dull ache in his chest and shoulder. Pt states that it lasted all night and resolved the next day. Pt states they had been drinking and eating bad. Advised to go to the ED if sx returned. Appointment made with Dr. Tomie China 12/26/23 for evaluation. Pt verbalized understanding and had no additional questions.

## 2023-12-21 DIAGNOSIS — I2510 Atherosclerotic heart disease of native coronary artery without angina pectoris: Secondary | ICD-10-CM | POA: Insufficient documentation

## 2023-12-21 DIAGNOSIS — E119 Type 2 diabetes mellitus without complications: Secondary | ICD-10-CM | POA: Insufficient documentation

## 2023-12-21 DIAGNOSIS — E785 Hyperlipidemia, unspecified: Secondary | ICD-10-CM | POA: Insufficient documentation

## 2023-12-26 ENCOUNTER — Encounter: Payer: Self-pay | Admitting: Cardiology

## 2023-12-26 ENCOUNTER — Ambulatory Visit: Admitting: Cardiology

## 2023-12-26 ENCOUNTER — Encounter (HOSPITAL_COMMUNITY): Payer: Self-pay

## 2023-12-26 ENCOUNTER — Ambulatory Visit: Attending: Cardiology | Admitting: Cardiology

## 2023-12-26 VITALS — BP 116/70 | HR 90 | Ht 68.6 in | Wt 242.1 lb

## 2023-12-26 DIAGNOSIS — I2510 Atherosclerotic heart disease of native coronary artery without angina pectoris: Secondary | ICD-10-CM | POA: Diagnosis not present

## 2023-12-26 DIAGNOSIS — E119 Type 2 diabetes mellitus without complications: Secondary | ICD-10-CM | POA: Diagnosis not present

## 2023-12-26 DIAGNOSIS — E669 Obesity, unspecified: Secondary | ICD-10-CM | POA: Diagnosis not present

## 2023-12-26 DIAGNOSIS — R0609 Other forms of dyspnea: Secondary | ICD-10-CM

## 2023-12-26 DIAGNOSIS — R0789 Other chest pain: Secondary | ICD-10-CM

## 2023-12-26 DIAGNOSIS — E782 Mixed hyperlipidemia: Secondary | ICD-10-CM | POA: Diagnosis not present

## 2023-12-26 DIAGNOSIS — R079 Chest pain, unspecified: Secondary | ICD-10-CM

## 2023-12-26 NOTE — Patient Instructions (Signed)
 Medication Instructions:  Your physician recommends that you continue on your current medications as directed. Please refer to the Current Medication list given to you today.   *If you need a refill on your cardiac medications before your next appointment, please call your pharmacy*   Lab Work: None ordered If you have labs (blood work) drawn today and your tests are completely normal, you will receive your results only by: MyChart Message (if you have MyChart) OR A paper copy in the mail If you have any lab test that is abnormal or we need to change your treatment, we will call you to review the results.   Testing/Procedures:   [facility] at [facility] [address], [address] [place], [place] [address] Phone: [phone]    Please arrive 15 minutes prior to your appointment time for registration and insurance purposes.  The test will take approximately 3 to 4 hours to complete; you may bring reading material.  If someone comes with you to your appointment, they will need to remain in the main lobby due to limited space in the testing area. **If you are pregnant or breastfeeding, please notify the nuclear lab prior to your appointment**  How to prepare for your Myocardial Perfusion Test: Do not eat or drink 3 hours prior to your test, except you may have water. Do not consume products containing caffeine (regular or decaffeinated) 12 hours prior to your test. (ex: coffee, chocolate, sodas, tea). Do bring a list of your current medications with you.  If not listed below, you may take your medications as normal. Do wear comfortable clothes (no dresses or overalls) and walking shoes, tennis shoes preferred (No heels or open toe shoes are allowed). Do NOT wear cologne, perfume, aftershave, or lotions (deodorant is allowed). If these instructions are not followed, your test will have to be rescheduled.  If you cannot keep your appointment, please provide  24 hours notification to the Nuclear Lab, to avoid a possible $50 charge to your account. Please report to [address] for your test.  If you have questions or concerns about your appointment, you can call the Nuclear Lab at [phone].   If you cannot keep your appointment, please provide 24 hours notification to the Nuclear Lab, to avoid a possible $50 charge to your account.    Your physician has requested that you have an echocardiogram. Echocardiography is a painless test that uses sound waves to create images of your heart. It provides your doctor with information about the size and shape of your heart and how well your heart's chambers and valves are working. This procedure takes approximately one hour. There are no restrictions for this procedure. Please do NOT wear cologne, perfume, aftershave, or lotions (deodorant is allowed). Please arrive 15 minutes prior to your appointment time.  Please note: We ask at that you not bring children with you during ultrasound (echo/ vascular) testing. Due to room size and safety concerns, children are not allowed in the ultrasound rooms during exams. Our front office staff cannot provide observation of children in our lobby area while testing is being conducted. An adult accompanying a patient to their appointment will only be allowed in the ultrasound room at the discretion of the ultrasound technician under special circumstances. We apologize for any inconvenience.    Follow-Up: At [facility], you and your health needs are our priority.  As part of our continuing mission to provide you with exceptional heart  care, we have created designated Provider Care Teams.  These Care Teams include your primary Cardiologist (physician) and Advanced Practice Providers (APPs -  Physician Assistants and Nurse Practitioners) who all work together to provide you with the care you need, when you need it.  We recommend signing up for the  patient portal called "MyChart".  Sign up information is provided on this After Visit Summary.  MyChart is used to connect with patients for Virtual Visits (Telemedicine).  Patients are able to view lab/test results, encounter notes, upcoming appointments, etc.  Non-urgent messages can be sent to your provider as well.   To learn more about what you can do with MyChart, go to [email or web address].    Your next appointment:   1-2 month(s)  Provider:   Gypsy Balsam, MD   Other Instructions  Cardiac Nuclear Scan A cardiac nuclear scan is a test that is done to check the flow of blood to your heart. It is done when you are resting and when you are exercising. The test looks for problems such as: Not enough blood reaching a portion of the heart. The heart muscle not working as it should. You may need this test if you have: Heart disease. Lab results that are not normal. Had heart surgery or a balloon procedure to open up blocked arteries (angioplasty) or a small mesh tube (stent). Chest pain. Shortness of breath. Had a heart attack. In this test, a special dye (tracer) is put into your bloodstream. The tracer will travel to your heart. A camera will then take pictures of your heart to see how the tracer moves through your heart. This test is usually done at a hospital and takes 2-4 hours. Tell a doctor about: Any allergies you have. All medicines you are taking, including vitamins, herbs, eye drops, creams, and over-the-counter medicines. Any bleeding problems you have. Any surgeries you have had. Any medical conditions you have. Whether you are pregnant or may be pregnant. Any history of asthma or long-term (chronic) lung disease. Any history of heart rhythm disorders or heart valve conditions. What are the risks? Your doctor will talk with you about risks. These may include: Serious chest pain and heart attack. This is only a risk if the stress portion of the test is done. Fast  or uneven heartbeats (palpitations). A feeling of warmth in your chest. This feeling usually does not last long. Allergic reaction to the tracer. Shortness of breath or trouble breathing. What happens before the test? Ask your doctor about changing or stopping your normal medicines. Follow instructions from your doctor about what you cannot eat or drink. Remove your jewelry on the day of the test. Ask your doctor if you need to avoid nicotine or caffeine. What happens during the test? An IV tube will be inserted into one of your veins. Your doctor will give you a small amount of tracer through the IV tube. You will wait for 20-40 minutes while the tracer moves through your bloodstream. Your heart will be monitored with an electrocardiogram (ECG). You will lie down on an exam table. Pictures of your heart will be taken for about 15-20 minutes. You may also have a stress test. For this test, one of these things may be done: You will be asked to exercise on a treadmill or a stationary bike. You will be given medicines that will make your heart work harder. This is done if you are unable to exercise. When blood flow to your heart has peaked, a  tracer will again be given through the IV tube. After 20-40 minutes, you will get back on the exam table. More pictures will be taken of your heart. Depending on the tracer that is used, more pictures may need to be taken 3-4 hours later. Your IV tube will be removed when the test is over. The test may vary among doctors and hospitals. What happens after the test? Ask your doctor: Whether you can return to your normal schedule, including diet, activities, travel, and medicines. Whether you should drink more fluids. This will help to remove the tracer from your body. Ask your doctor, or the department that is doing the test: When will my results be ready? How will I get my results? What are my treatment options? What other tests do I need? What are  my next steps? This information is not intended to replace advice given to you by your health care provider. Make sure you discuss any questions you have with your health care provider. Document Revised: 02/08/2022 Document Reviewed: 02/08/2022 Elsevier Patient Education  2023 Elsevier Inc.  Echocardiogram An echocardiogram is a test that uses sound waves (ultrasound) to produce images of the heart. Images from an echocardiogram can provide important information about: Heart size and shape. The size and thickness and movement of your heart's walls. Heart muscle function and strength. Heart valve function or if you have stenosis. Stenosis is when the heart valves are too narrow. If blood is flowing backward through the heart valves (regurgitation). A tumor or infectious growth around the heart valves. Areas of heart muscle that are not working well because of poor blood flow or injury from a heart attack. Aneurysm detection. An aneurysm is a weak or damaged part of an artery wall. The wall bulges out from the normal force of blood pumping through the body. Tell a health care provider about: Any allergies you have. All medicines you are taking, including vitamins, herbs, eye drops, creams, and over-the-counter medicines. Any blood disorders you have. Any surgeries you have had. Any medical conditions you have. Whether you are pregnant or may be pregnant. What are the risks? Generally, this is a safe test. However, problems may occur, including an allergic reaction to dye (contrast) that may be used during the test. What happens before the test? No specific preparation is needed. You may eat and drink normally. What happens during the test?  You will take off your clothes from the waist up and put on a hospital gown. Electrodes or electrocardiogram (ECG)patches may be placed on your chest. The electrodes or patches are then connected to a device that monitors your heart rate and  rhythm. You will lie down on a table for an ultrasound exam. A gel will be applied to your chest to help sound waves pass through your skin. A handheld device, called a transducer, will be pressed against your chest and moved over your heart. The transducer produces sound waves that travel to your heart and bounce back (or "echo" back) to the transducer. These sound waves will be captured in real-time and changed into images of your heart that can be viewed on a video monitor. The images will be recorded on a computer and reviewed by your health care provider. You may be asked to change positions or hold your breath for a short time. This makes it easier to get different views or better views of your heart. In some cases, you may receive contrast through an IV in one of your veins.  This can improve the quality of the pictures from your heart. The procedure may vary among health care providers and hospitals. What can I expect after the test? You may return to your normal, everyday life, including diet, activities, and medicines, unless your health care provider tells you not to do that. Follow these instructions at home: It is up to you to get the results of your test. Ask your health care provider, or the department that is doing the test, when your results will be ready. Keep all follow-up visits. This is important. Summary An echocardiogram is a test that uses sound waves (ultrasound) to produce images of the heart. Images from an echocardiogram can provide important information about the size and shape of your heart, heart muscle function, heart valve function, and other possible heart problems. You do not need to do anything to prepare before this test. You may eat and drink normally. After the echocardiogram is completed, you may return to your normal, everyday life, unless your health care provider tells you not to do that. This information is not intended to replace advice given to you by your  health care provider. Make sure you discuss any questions you have with your health care provider. Document Revised: 05/26/2021 Document Reviewed: 05/05/2020 Elsevier Patient Education  2023 Elsevier Inc.    Important Information About Sugar

## 2023-12-26 NOTE — Progress Notes (Signed)
 Cardiology Office Note:    Date:  12/26/2023   ID:  Tim Cook, DOB [birth year], MRN [id number]  PCP:  Tim Goodpasture, NP  Cardiologist:  Tim Brothers, MD   Referring MD: Tim Lund, PA    ASSESSMENT:    1. Mixed hyperlipidemia   2. Coronary artery disease involving native coronary artery of native heart without angina pectoris   3. Diabetes mellitus without complication (HCC)   4. Obesity (BMI 35.0-39.9 without comorbidity)   5. Chest pain, unspecified type   6. DOE (dyspnea on exertion)   7. Chest pain, atypical    PLAN:    In order of problems listed above:  Coronary artery disease: Chest pain: Atypical in nature.  Did not relieved with sublingual nitroglycerin.  Since the past 2 weeks he has been well-regulated without any symptoms.  Symptoms are low risk.  I recommended various modalities of evaluation and the patient agreed for Lexiscan sestamibi. Essential hypertension: Blood pressure is stable and diet was emphasized. Mixed dyslipidemia and diabetes mellitus: Managed by primary care.  I reviewed lab work from his cell phone and it appears to be fine.  He is doing well with diet. Obesity: Weight reduction stressed diet emphasized.  Risks of obesity explained and he promises to do better. Patient will be seen in follow-up appointment in 1-2 months Tim Cook or earlier if the patient has any concerns.    Medication Adjustments/Labs and Tests Ordered: Current medicines are reviewed at length with the patient today.  Concerns regarding medicines are outlined above.  Orders Placed This Encounter  Procedures   MYOCARDIAL PERFUSION IMAGING   EKG 12-Lead   ECHOCARDIOGRAM COMPLETE   No orders of the defined types were placed in this encounter.    No chief complaint on file.    History of Present Illness:    Tim Cook is a 63 y.o. male.  Patient has previously unknown to me.  He sees Tim Cook. He has coronary artery disease and remote past.  He  has history of essential hypertension, dyslipidemia, diabetes mellitus and obesity.  He is a sedentary lifestyle because of orthopedic issues.  He mentions to me that about 2 weeks ago he had some dull pain in the right shoulder and right upper chest.  He has orthopedic issues also in that part of the body.  Because of this she was concerned and came for an evaluation.  At the time of my evaluation, the patient is alert awake oriented and in no distress.  Past Medical History:  Diagnosis Date   Acute lateral meniscal tear 06/09/2016   Arthritis    "knees, lower back, hands" (05/18/2016)   Atherosclerotic heart disease of native coronary artery without angina pectoris    05/18/16  Cath severe 90% LAD prox, 99% mid, Circ dominant with 30% OM1, 50% OM2, 30% OM3, 60% distal circ and PL, severe stenosis nondominant RCA.  EF 55%  3.0x 20, 2.5 x 20 mm, 2.25 x 16 mm Promus premier sent to [facility] Dr. Clifton James    Bariatric surgery status 05/18/2016   Benign essential hypertension 01/28/2021   Bursitis of right shoulder 01/21/2019   CAD (coronary artery disease), native coronary artery    05/18/16  Cath severe 90% LAD prox, 99% mid, Circ dominant with 30% OM1, 50% OM2, 30% OM3, 60% distal circ and PL, severe stenosis nondominant RCA.  EF 55%  3.0x 20, 2.5 x 20 mm, 2.25 x 16 mm Promus premier sent to [facility] Dr.  McALhany    Chronic bronchitis (HCC)    "most years" (05/18/2016)   Chronic lower back pain    Closed fracture of right distal radius 03/12/2020   Closed fracture of right proximal humerus 03/12/2020   DDD (degenerative disc disease), lumbar    Degenerative arthritis of right knee 05/09/2017   Degenerative disc disease, lumbar 07/07/2016   Degenerative lumbar spinal stenosis 04/07/2021   Diabetes mellitus without complication (HCC)    Dislocation of left middle finger 01/21/2019   Encounter for immunization 01/28/2021   Erectile dysfunction 01/28/2021   Gastro-esophageal reflux 01/28/2021   History of  nutritional deficiency 10/26/2021   Hyperglycemia due to type 2 diabetes mellitus (HCC) 01/28/2021   Hyperlipidemia    Hypertensive heart disease without CHF    Lumbago with sciatica, right side 04/07/2021   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 type 2 diabetes mellitus (HCC) 01/28/2021   Mixed hyperlipidemia 05/18/2016   Nonallopathic lesion of lumbosacral region 07/07/2016   Nonallopathic lesion of sacral region 07/07/2016   Nonallopathic lesion of thoracic region 07/07/2016   Obesity    Radiculopathy, lumbar region 04/07/2021   Shoulder joint pain 01/28/2021   Type II diabetes mellitus (HCC)    Unstable angina (HCC)    Vitamin D deficiency 01/28/2021    Past Surgical History:  Procedure Laterality Date   CARDIAC CATHETERIZATION N/A 05/18/2016   Procedure: Left Heart Cath and Coronary Angiography;  Surgeon: Kathleene Hazel, MD;  Location[facility] INVASIVE CV LAB;  Service: Cardiovascular;  Laterality: N/A;   CARDIAC CATHETERIZATION N/A 05/18/2016   Procedure: Coronary Stent Intervention;  Surgeon: Kathleene Hazel, MD;  Location: [facility] INVASIVE CV LAB;  Service: Cardiovascular;  Laterality: N/A;   CARPAL TUNNEL RELEASE Right 03/17/2020   Procedure: CARPAL TUNNEL RELEASE;  Surgeon: Teryl Lucy, MD;  Location: [facility] ORS;  Service: Orthopedics;  Laterality: Right;   CORONARY ANGIOPLASTY     FOOT FRACTURE SURGERY Right 1970s   LAPAROSCOPIC GASTRIC BANDING  07/2007   by Dr. Daphine Deutscher   ORIF HUMERUS FRACTURE Right 03/17/2020   Procedure: OPEN REDUCTION INTERNAL FIXATION (ORIF) PROXIMAL HUMERUS FRACTURE AND DISTAL RADIUS FRACTURE;  Surgeon: Teryl Lucy, MD;  Location: [facility] ORS;  Service: Orthopedics;  Laterality: Right;   PILONIDAL CYST EXCISION  ~ 1985   "took 8 out all at once"   SQUAMOUS CELL CARCINOMA EXCISION     right upper arm, left shoulder    Current Medications: Current Meds  Medication Sig   aspirin EC 81 MG tablet Take 81 mg by  mouth daily.   atorvastatin (LIPITOR) 40 MG tablet Take 40 mg by mouth daily.   Dulaglutide (TRULICITY [place]) Inject 0.5 mg into the skin once a week.   FARXIGA 10 MG TABS tablet Take 10 mg by mouth daily.    gabapentin (NEURONTIN) 100 MG capsule Take 2 capsules (200 mg total) by mouth at bedtime.   HYDROcodone-acetaminophen (NORCO) 10-325 MG tablet Take 1 tablet by mouth as needed for pain.   loratadine (CLARITIN) 10 MG tablet Take 10 mg by mouth daily.   losartan (COZAAR) 100 MG tablet Take 100 mg by mouth daily.   metFORMIN (GLUCOPHAGE) 500 MG tablet Take 1,000 mg by mouth 2 (two) times daily with a meal.   Multiple Vitamin (MULTIVITAMIN) capsule Take 1 capsule by mouth daily. Unknown strength   naproxen sodium (ALEVE) 220 MG tablet Take 220 mg by mouth daily as needed (pain).   nitroGLYCERIN (NITROSTAT) 0.4 MG SL tablet PLACE 1 TABLET  UNDER THE TONGUE EVERY 5 MINUTES AS NEEDED FOR CHEST PAIN.   tiZANidine (ZANAFLEX) 2 MG tablet TAKE 1 TABLET BY MOUTH EVERYDAY AT BEDTIME   Vitamin D, Ergocalciferol, (DRISDOL) 1.25 MG (50000 UNIT) CAPS capsule TAKE 1 CAPSULE EVERY 7 DAYS     Allergies:   Patient has no known allergies.   Social History   Socioeconomic History   Marital status: Married    Spouse name: Not on file   Number of children: Not on file   Years of education: Not on file   Highest education level: Not on file  Occupational History   Not on file  Tobacco Use   Smoking status: Former    Current packs/day: 0.00    Average packs/day: 0.5 packs/day for 12.0 years (6.0 ttl pk-yrs)    Types: Cigarettes    Start date: 12/15/1979    Quit date: 12/15/1991    Years since quitting: 32.0   Smokeless tobacco: Former    Types: Snuff   Tobacco comments:    "quit snuff in ~ 1981"  Vaping Use   Vaping status: Never Used  Substance and Sexual Activity   Alcohol use: Yes    Alcohol/week: 7.0 standard drinks of alcohol    Types: 7 Cans of beer per week    Comment: More social use    Drug use: No   Sexual activity: Yes  Other Topics Concern   Not on file  Social History Narrative   Not on file   Social Drivers of Health   Financial Resource Strain: Not on file  Food Insecurity: Not on file  Transportation Needs: Not on file  Physical Activity: Not on file  Stress: Not on file  Social Connections: Not on file     Family History: The patient's family history includes Diabetes Mellitus I in his sister; Heart attack in his father; Heart disease in his father, mother, and sister; Stroke in his brother and father.  ROS:   Please see the history of present illness.    All other systems reviewed and are negative.  EKGs/Labs/Other Studies Reviewed:    The following studies were reviewed today: .Marland KitchenEKG Interpretation Date/Time:  Tuesday December 26 2023 10:09:34 EDT Ventricular Rate:  90 PR Interval:  124 QRS Duration:  94 QT Interval:  370 QTC Calculation: 452 R Axis:   -5  Text Interpretation: Normal sinus rhythm Left ventricular hypertrophy with repolarization abnormality ( R in aVL , Romhilt-Estes ) Inferior infarct , age undetermined When compared with ECG of 16-Mar-2020 15:02, Inferior infarct is now Present Confirmed by Belva Crome [phone]) on 12/26/2023 10:30:17 AM     Recent Labs: No results found for requested labs within last 365 days.  Recent Lipid Panel    Component Value Date/Time   CHOL 125 12/07/2018 1520   TRIG 103 12/07/2018 1520   HDL 57 12/07/2018 1520   CHOLHDL 2.2 12/07/2018 1520   LDLCALC 47 12/07/2018 1520    Physical Exam:    VS:  BP 116/70   Pulse 90   Ht 5' 8.6" (1.742 m)   Wt 242 lb 1.3 oz (109.8 kg)   SpO2 97%   BMI 36.17 kg/m     Wt Readings from Last 3 Encounters:  12/26/23 242 lb 1.3 oz (109.8 kg)  12/12/23 245 lb (111.1 kg)  07/27/23 243 lb (110.2 kg)     GEN: Patient is in no acute distress HEENT: Normal NECK: No JVD; No carotid bruits LYMPHATICS: No lymphadenopathy CARDIAC: Hear sounds  regular, 2/6  systolic murmur at the apex. RESPIRATORY:  Clear to auscultation without rales, wheezing or rhonchi  ABDOMEN: Soft, non-tender, non-distended MUSCULOSKELETAL:  No edema; No deformity  SKIN: Warm and dry NEUROLOGIC:  Alert and oriented x 3 PSYCHIATRIC:  Normal affect   Signed, Tim Brothers, MD  12/26/2023 10:47 AM    [facility]

## 2023-12-27 ENCOUNTER — Telehealth (HOSPITAL_COMMUNITY): Payer: Self-pay | Admitting: *Deleted

## 2023-12-27 NOTE — Telephone Encounter (Signed)
 Spoke with patient and he was given instructions and directions for his STRESS TEST on 12/29/23 at 7:30. Not sure if patient remembers the correct day for his test. I reminded him a second time that his test is not tomorrow but on Friday.

## 2023-12-29 ENCOUNTER — Ambulatory Visit (HOSPITAL_COMMUNITY): Attending: Cardiology

## 2023-12-29 DIAGNOSIS — R0609 Other forms of dyspnea: Secondary | ICD-10-CM | POA: Diagnosis not present

## 2023-12-29 DIAGNOSIS — R079 Chest pain, unspecified: Secondary | ICD-10-CM

## 2023-12-29 LAB — MYOCARDIAL PERFUSION IMAGING
LV dias vol: 90 mL (ref 62–150)
LV sys vol: 48 mL
Nuc Stress EF: 47 %
Peak HR: 112 {beats}/min
Rest HR: 71 {beats}/min
Rest Nuclear Isotope Dose: 10.8 mCi
SDS: 3
SRS: 2
SSS: 5
ST Depression (mm): 0 mm
Stress Nuclear Isotope Dose: 31.7 mCi
TID: 1.05

## 2023-12-29 MED ORDER — TECHNETIUM TC 99M TETROFOSMIN IV KIT
31.7000 | PACK | Freq: Once | INTRAVENOUS | Status: AC | PRN
  Administered 2023-12-29: 31.7 via INTRAVENOUS

## 2023-12-29 MED ORDER — REGADENOSON 0.4 MG/5ML IV SOLN
0.4000 mg | Freq: Once | INTRAVENOUS | Status: AC
  Administered 2023-12-29: 0.4 mg via INTRAVENOUS

## 2023-12-29 MED ORDER — TECHNETIUM TC 99M TETROFOSMIN IV KIT
10.8000 | PACK | Freq: Once | INTRAVENOUS | Status: AC | PRN
  Administered 2023-12-29: 10.8 via INTRAVENOUS

## 2024-01-09 DIAGNOSIS — E113293 Type 2 diabetes mellitus with mild nonproliferative diabetic retinopathy without macular edema, bilateral: Secondary | ICD-10-CM | POA: Diagnosis not present

## 2024-01-09 DIAGNOSIS — H35372 Puckering of macula, left eye: Secondary | ICD-10-CM | POA: Diagnosis not present

## 2024-01-09 DIAGNOSIS — H43813 Vitreous degeneration, bilateral: Secondary | ICD-10-CM | POA: Diagnosis not present

## 2024-01-09 DIAGNOSIS — H2513 Age-related nuclear cataract, bilateral: Secondary | ICD-10-CM | POA: Diagnosis not present

## 2024-01-17 ENCOUNTER — Ambulatory Visit: Payer: Medicare Other | Admitting: Cardiology

## 2024-01-24 ENCOUNTER — Ambulatory Visit (HOSPITAL_COMMUNITY): Attending: Cardiovascular Disease

## 2024-01-24 DIAGNOSIS — R0609 Other forms of dyspnea: Secondary | ICD-10-CM | POA: Insufficient documentation

## 2024-01-24 DIAGNOSIS — R079 Chest pain, unspecified: Secondary | ICD-10-CM | POA: Insufficient documentation

## 2024-01-24 LAB — ECHOCARDIOGRAM COMPLETE
AR max vel: 0.67 cm2
AV Area VTI: 0.78 cm2
AV Area mean vel: 0.83 cm2
AV Mean grad: 26 mmHg
AV Peak grad: 49 mmHg
Ao pk vel: 3.5 m/s
Area-P 1/2: 3.6 cm2
Est EF: 55
S' Lateral: 3.5 cm

## 2024-01-24 MED ORDER — PERFLUTREN LIPID MICROSPHERE
1.0000 mL | INTRAVENOUS | Status: AC | PRN
  Administered 2024-01-24: 1 mL via INTRAVENOUS

## 2024-01-29 NOTE — Telephone Encounter (Signed)
    Pt is calling to follow up 

## 2024-02-05 ENCOUNTER — Encounter: Payer: Self-pay | Admitting: Cardiology

## 2024-02-05 ENCOUNTER — Ambulatory Visit: Attending: Cardiology | Admitting: Cardiology

## 2024-02-05 VITALS — BP 98/68 | HR 74 | Ht 69.0 in | Wt 239.0 lb

## 2024-02-05 DIAGNOSIS — I350 Nonrheumatic aortic (valve) stenosis: Secondary | ICD-10-CM | POA: Diagnosis not present

## 2024-02-05 DIAGNOSIS — I2510 Atherosclerotic heart disease of native coronary artery without angina pectoris: Secondary | ICD-10-CM

## 2024-02-05 DIAGNOSIS — E782 Mixed hyperlipidemia: Secondary | ICD-10-CM

## 2024-02-05 DIAGNOSIS — I119 Hypertensive heart disease without heart failure: Secondary | ICD-10-CM | POA: Diagnosis not present

## 2024-02-05 NOTE — Progress Notes (Signed)
 Cardiology Office Note:    Date:  02/05/2024   ID:  Jeryl Moris, DOB 23-May-1961, MRN [id number]  PCP:  Chyrel Craw, NP  Cardiologist:  Ralene Burger, MD    Referring MD: Chyrel Craw, NP   Chief Complaint  Patient presents with   Results    History of Present Illness:    Tim Cook is a 63 y.o. male past medical history significant for coronary artery disease in 2017 he did have stent to proximal LAD as well as mid LAD for 90% stenosis, additional problem include essential hypertension, diabetes, dyslipidemia, obstructive sleep apnea.  Recently he had a stress test done which was negative also echocardiogram which surprisingly showed aortic valve stenosis which appears to be severe.  Calculated aortic valve area 0.78, however mean gradient is only 26 mmHg, stroke-volume index is only 31 and therefore there may be some component of low-flow low gradient aortic stenosis.  Interestingly echocardiogram done in 2021 did not show any aortic stenosis on my physical exam when I did see him last time I did not hear much murmur.  Today on the physical exam I can barely hear them heart murmur, S2 is still out and present.  He is doing overall well.  He even started joining the gym and going there.  Problem is some arthritic pain.  Past Medical History:  Diagnosis Date   Acute lateral meniscal tear 06/09/2016   Arthritis    "knees, lower back, hands" (05/18/2016)   Atherosclerotic heart disease of native coronary artery without angina pectoris    05/18/16  Cath severe 90% LAD prox, 99% mid, Circ dominant with 30% OM1, 50% OM2, 30% OM3, 60% distal circ and PL, severe stenosis nondominant RCA.  EF 55%  3.0x 20, 2.5 x 20 mm, 2.25 x 16 mm Promus premier sent to LAD Dr. Abel Hoe    Bariatric surgery status 05/18/2016   Benign essential hypertension 01/28/2021   Bursitis of right shoulder 01/21/2019   CAD (coronary artery disease), native coronary artery    05/18/16  Cath severe 90% LAD  prox, 99% mid, Circ dominant with 30% OM1, 50% OM2, 30% OM3, 60% distal circ and PL, severe stenosis nondominant RCA.  EF 55%  3.0x 20, 2.5 x 20 mm, 2.25 x 16 mm Promus premier sent to LAD Dr. Abel Hoe    Chronic bronchitis [facility])    "most years" (05/18/2016)   Chronic lower back pain    Closed fracture of right distal radius 03/12/2020   Closed fracture of right proximal humerus 03/12/2020   DDD (degenerative disc disease), lumbar    Degenerative arthritis of right knee 05/09/2017   Degenerative disc disease, lumbar 07/07/2016   Degenerative lumbar spinal stenosis 04/07/2021   Diabetes mellitus without complication (HCC)    Dislocation of left middle finger 01/21/2019   Encounter for immunization 01/28/2021   Erectile dysfunction 01/28/2021   Gastro-esophageal reflux 01/28/2021   History of nutritional deficiency 10/26/2021   Hyperglycemia due to type 2 diabetes mellitus (HCC) 01/28/2021   Hyperlipidemia    Hypertensive heart disease without CHF    Lumbago with sciatica, right side 04/07/2021   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 type 2 diabetes mellitus (HCC) 01/28/2021   Mixed hyperlipidemia 05/18/2016   Nonallopathic lesion of lumbosacral region 07/07/2016   Nonallopathic lesion of sacral region 07/07/2016   Nonallopathic lesion of thoracic region 07/07/2016   Obesity    Radiculopathy, lumbar region 04/07/2021   Shoulder joint pain 01/28/2021   Type  II diabetes mellitus (HCC)    Unstable angina (HCC)    Vitamin D  deficiency 01/28/2021    Past Surgical History:  Procedure Laterality Date   CARDIAC CATHETERIZATION N/A 05/18/2016   Procedure: Left Heart Cath and Coronary Angiography;  Surgeon: Odie Benne, MD;  Location: [facility] INVASIVE CV LAB;  Service: Cardiovascular;  Laterality: N/A;   CARDIAC CATHETERIZATION N/A 05/18/2016   Procedure: Coronary Stent Intervention;  Surgeon: Odie Benne, MD;  Location: [facility] INVASIVE  CV LAB;  Service: Cardiovascular;  Laterality: N/A;   CARPAL TUNNEL RELEASE Right 03/17/2020   Procedure: CARPAL TUNNEL RELEASE;  Surgeon: Osa Blase, MD;  Location: [facility] ORS;  Service: Orthopedics;  Laterality: Right;   CORONARY ANGIOPLASTY     FOOT FRACTURE SURGERY Right 1970s   LAPAROSCOPIC GASTRIC BANDING  07/2007   by Dr. Gaylyn Keas   ORIF HUMERUS FRACTURE Right 03/17/2020   Procedure: OPEN REDUCTION INTERNAL FIXATION (ORIF) PROXIMAL HUMERUS FRACTURE AND DISTAL RADIUS FRACTURE;  Surgeon: Osa Blase, MD;  Location: [facility] ORS;  Service: Orthopedics;  Laterality: Right;   PILONIDAL CYST EXCISION  ~ 1985   "took 8 out all at once"   SQUAMOUS CELL CARCINOMA EXCISION     right upper arm, left shoulder    Current Medications: Current Meds  Medication Sig   aspirin  EC 81 MG tablet Take 81 mg by mouth daily.   atorvastatin  (LIPITOR) 40 MG tablet Take 40 mg by mouth daily.   FARXIGA  10 MG TABS tablet Take 10 mg by mouth daily.    gabapentin  (NEURONTIN ) 100 MG capsule Take 2 capsules (200 mg total) by mouth at bedtime.   HYDROcodone -acetaminophen  (NORCO) 10-325 MG tablet Take 1 tablet by mouth as needed for pain.   loratadine  (CLARITIN ) 10 MG tablet Take 10 mg by mouth daily.   losartan  (COZAAR ) 100 MG tablet Take 100 mg by mouth daily.   metFORMIN  (GLUCOPHAGE ) 500 MG tablet Take 1,000 mg by mouth 2 (two) times daily with a meal.   MOUNJARO 5 MG/0.5ML Pen Inject 5 mg into the skin once a week.   Multiple Vitamin (MULTIVITAMIN) capsule Take 1 capsule by mouth daily. Unknown strength   naproxen sodium (ALEVE) 220 MG tablet Take 220 mg by mouth daily as needed (pain).   nitroGLYCERIN  (NITROSTAT ) 0.4 MG SL tablet PLACE 1 TABLET UNDER THE TONGUE EVERY 5 MINUTES AS NEEDED FOR CHEST PAIN. (Patient taking differently: Place 0.4 mg under the tongue every 5 (five) minutes as needed for chest pain.)   tiZANidine  (ZANAFLEX ) 2 MG tablet TAKE 1 TABLET BY MOUTH EVERYDAY AT BEDTIME (Patient taking differently:  Take 2 mg by mouth at bedtime.)   Vitamin D , Ergocalciferol , (DRISDOL ) 1.25 MG (50000 UNIT) CAPS capsule TAKE 1 CAPSULE EVERY 7 DAYS (Patient taking differently: Take 50,000 Units by mouth every 7 (seven) days.)   [DISCONTINUED] Dulaglutide (TRULICITY [place]) Inject 0.5 mg into the skin once a week.     Allergies:   Patient has no known allergies.   Social History   Socioeconomic History   Marital status: Married    Spouse name: Not on file   Number of children: Not on file   Years of education: Not on file   Highest education level: Not on file  Occupational History   Not on file  Tobacco Use   Smoking status: Former    Current packs/day: 0.00    Average packs/day: 0.5 packs/day for 12.0 years (6.0 ttl pk-yrs)    Types: Cigarettes    Start date: 12/15/1979  Quit date: 12/15/1991    Years since quitting: 32.1   Smokeless tobacco: Former    Types: Snuff   Tobacco comments:    "quit snuff in ~ 1981"  Vaping Use   Vaping status: Never Used  Substance and Sexual Activity   Alcohol use: Yes    Alcohol/week: 7.0 standard drinks of alcohol    Types: 7 Cans of beer per week    Comment: More social use   Drug use: No   Sexual activity: Yes  Other Topics Concern   Not on file  Social History Narrative   Not on file   Social Drivers of Health   Financial Resource Strain: Not on file  Food Insecurity: Not on file  Transportation Needs: Not on file  Physical Activity: Not on file  Stress: Not on file  Social Connections: Not on file     Family History: The patient's family history includes Diabetes Mellitus I in his sister; Heart attack in his father; Heart disease in his father, mother, and sister; Stroke in his brother and father. ROS:   Please see the history of present illness.    All 14 point review of systems negative except as described per history of present illness  EKGs/Labs/Other Studies Reviewed:         Recent Labs: No results found for requested labs  within last 365 days.  Recent Lipid Panel    Component Value Date/Time   CHOL 125 12/07/2018 1520   TRIG 103 12/07/2018 1520   HDL 57 12/07/2018 1520   CHOLHDL 2.2 12/07/2018 1520   LDLCALC 47 12/07/2018 1520    Physical Exam:    VS:  BP 98/68 (BP Location: Left Arm, Patient Position: Sitting)   Pulse 74   Ht 5\' 9"  (1.753 m)   Wt 239 lb (108.4 kg)   SpO2 95%   BMI 35.29 kg/m     Wt Readings from Last 3 Encounters:  02/05/24 239 lb (108.4 kg)  12/29/23 242 lb (109.8 kg)  12/26/23 242 lb 1.3 oz (109.8 kg)     GEN:  Well nourished, well developed in no acute distress HEENT: Normal NECK: No JVD; No carotid bruits LYMPHATICS: No lymphadenopathy CARDIAC: RRR, systolic ejection murmur but maybe 1-2/6 S2 is still out, no rubs, no gallops RESPIRATORY:  Clear to auscultation without rales, wheezing or rhonchi  ABDOMEN: Soft, non-tender, non-distended MUSCULOSKELETAL:  No edema; No deformity  SKIN: Warm and dry LOWER EXTREMITIES: no swelling NEUROLOGIC:  Alert and oriented x 3 PSYCHIATRIC:  Normal affect   ASSESSMENT:    1. Nonrheumatic aortic valve stenosis   2. Coronary artery disease involving native coronary artery of native heart without angina pectoris   3. Hypertensive heart disease without CHF   4. Mixed hyperlipidemia    PLAN:    In order of problems listed above:  Aortic stenosis which appears to be significant based on echocardiogram however physical exam does not confirm that something does not make sense here.  I will schedule him to have another echocardiogram focusing on aortic stenosis.  Most likely I will send him to our structural heart team he may require cardiac catheterization to look at the coronaries as well as look at the degree of aortic stenosis.  Until then I ask him not to overexert himself. Coronary disease stable.  He is therapy is optimal. Dyslipidemia I did review K PN which show me LDL is 59 HDL 55 this is from January 25   Medication  Adjustments/Labs  and Tests Ordered: Current medicines are reviewed at length with the patient today.  Concerns regarding medicines are outlined above.  No orders of the defined types were placed in this encounter.  Medication changes: No orders of the defined types were placed in this encounter.   Signed, Manfred Seed, MD, [facility] 02/05/2024 9:39 AM    [facility]

## 2024-02-05 NOTE — Addendum Note (Signed)
 Addended by: Aurelio Leer I on: 02/05/2024 09:52 AM   Modules accepted: Orders

## 2024-02-05 NOTE — Patient Instructions (Signed)
 Medication Instructions:  Your physician recommends that you continue on your current medications as directed. Please refer to the Current Medication list given to you today.  *If you need a refill on your cardiac medications before your next appointment, please call your pharmacy*  Lab Work: None If you have labs (blood work) drawn today and your tests are completely normal, you will receive your results only by: MyChart Message (if you have MyChart) OR A paper copy in the mail If you have any lab test that is abnormal or we need to change your treatment, we will call you to review the results.  Testing/Procedures: Your physician has requested that you have an echocardiogram. Echocardiography is a painless test that uses sound waves to create images of your heart. It provides your doctor with information about the size and shape of your heart and how well your heart's chambers and valves are working. This procedure takes approximately one hour. There are no restrictions for this procedure. Please do NOT wear cologne, perfume, aftershave, or lotions (deodorant is allowed). Please arrive 15 minutes prior to your appointment time.  Please note: We ask at that you not bring children with you during ultrasound (echo/ vascular) testing. Due to room size and safety concerns, children are not allowed in the ultrasound rooms during exams. Our front office staff cannot provide observation of children in our lobby area while testing is being conducted. An adult accompanying a patient to their appointment will only be allowed in the ultrasound room at the discretion of the ultrasound technician under special circumstances. We apologize for any inconvenience.   Follow-Up: At [facility], you and your health needs are our priority.  As part of our continuing mission to provide you with exceptional heart care, our providers are all part of one team.  This team includes your primary Cardiologist  (physician) and Advanced Practice Providers or APPs (Physician Assistants and Nurse Practitioners) who all work together to provide you with the care you need, when you need it.  Your next appointment:   1 month(s)  Provider:   Ralene Burger, MD    We recommend signing up for the patient portal called "MyChart".  Sign up information is provided on this After Visit Summary.  MyChart is used to connect with patients for Virtual Visits (Telemedicine).  Patients are able to view lab/test results, encounter notes, upcoming appointments, etc.  Non-urgent messages can be sent to your provider as well.   To learn more about what you can do with MyChart, go to [email or web address].   Other Instructions None

## 2024-02-09 ENCOUNTER — Other Ambulatory Visit: Payer: Self-pay | Admitting: Family Medicine

## 2024-02-23 NOTE — Progress Notes (Signed)
 [facility] [address] [place] [address] Phone: [phone] Subjective:   Tim Cook am a scribe for Dr. Felipe Cook.   I'm seeing this patient by the request  of:  Tim Craw, NP  CC: back and neck pain follow up   [id number]  Tim Cook is a 63 y.o. male coming in with complaint of back and neck pain. OMT 12/12/2023. Patient states that back is sore. Pain I the low back for about two weeks now after taking some trach bins off a truck.  Medications patient has been prescribed: Gabapentin   Taking:         Reviewed prior external information including notes and imaging from previsou exam, outside providers and external EMR if available.   As well as notes that were available from care everywhere and other healthcare systems.  Past medical history, social, surgical and family history all reviewed in electronic medical record.  No pertanent information unless stated regarding to the chief complaint.   Past Medical History:  Diagnosis Date   Acute lateral meniscal tear 06/09/2016   Arthritis    "knees, lower back, hands" (05/18/2016)   Atherosclerotic heart disease of native coronary artery without angina pectoris    05/18/16  Cath severe 90% LAD prox, 99% mid, Circ dominant with 30% OM1, 50% OM2, 30% OM3, 60% distal circ and PL, severe stenosis nondominant RCA.  EF 55%  3.0x 20, 2.5 x 20 mm, 2.25 x 16 mm Promus premier sent to LAD Dr. Abel Cook    Bariatric surgery status 05/18/2016   Benign essential hypertension 01/28/2021   Bursitis of right shoulder 01/21/2019   CAD (coronary artery disease), native coronary artery    05/18/16  Cath severe 90% LAD prox, 99% mid, Circ dominant with 30% OM1, 50% OM2, 30% OM3, 60% distal circ and PL, severe stenosis nondominant RCA.  EF 55%  3.0x 20, 2.5 x 20 mm, 2.25 x 16 mm Promus premier sent to LAD Dr. Abel Cook    Chronic bronchitis Tim Cook)    "most years" (05/18/2016)   Chronic lower back  pain    Closed fracture of right distal radius 03/12/2020   Closed fracture of right proximal humerus 03/12/2020   DDD (degenerative disc disease), lumbar    Degenerative arthritis of right knee 05/09/2017   Degenerative disc disease, lumbar 07/07/2016   Degenerative lumbar spinal stenosis 04/07/2021   Diabetes mellitus without complication (HCC)    Dislocation of left middle finger 01/21/2019   Encounter for immunization 01/28/2021   Erectile dysfunction 01/28/2021   Gastro-esophageal reflux 01/28/2021   History of nutritional deficiency 10/26/2021   Hyperglycemia due to type 2 diabetes mellitus (HCC) 01/28/2021   Hyperlipidemia    Hypertensive heart disease without CHF    Lumbago with sciatica, right side 04/07/2021   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 type 2 diabetes mellitus (HCC) 01/28/2021   Mixed hyperlipidemia 05/18/2016   Nonallopathic lesion of lumbosacral region 07/07/2016   Nonallopathic lesion of sacral region 07/07/2016   Nonallopathic lesion of thoracic region 07/07/2016   Obesity    Radiculopathy, lumbar region 04/07/2021   Shoulder joint pain 01/28/2021   Type II diabetes mellitus (HCC)    Unstable angina (HCC)    Vitamin D  deficiency 01/28/2021    No Known Allergies   Review of Systems:  No headache, visual changes, nausea, vomiting, diarrhea, constipation, dizziness, abdominal pain, skin rash, fevers, chills, night sweats, weight loss, swollen lymph nodes, body aches, joint  swelling, chest pain, shortness of breath, mood changes. POSITIVE muscle aches  Objective  There were no vitals taken for this visit.   General: No apparent distress alert and oriented x3 mood and affect normal, dressed appropriately.  HEENT: Pupils equal, extraocular movements intact  Respiratory: Patient's speak in full sentences and does not appear short of breath  Cardiovascular: No lower extremity edema, non tender, no erythema   Gait MSK:  Back does have some loss of lordosis.  Muscle tightness noted with Veldon German right greater than left.  Negative straight leg test noted.  Patient is severely tender over the sacroiliac joints.  Osteopathic findings  C6 flexed rotated and side bent left T3 extended rotated and side bent right inhaled rib T6 extended rotated and side bent left T11 extended rotated and side bent right L2 flexed rotated and side bent right L3 flexed rotated and side bent left L5 flexed rotated and side bent Sacrum right on right       Assessment and Plan:  No problem-specific Assessment & Plan notes found for this encounter.    Nonallopathic problems  Decision today to treat with OMT was based on Physical Exam  After verbal consent patient was treated with HVLA, ME, FPR techniques in cervical, rib, thoracic, lumbar, and sacral  areas  Patient tolerated the procedure well with improvement in symptoms  Patient given exercises, stretches and lifestyle modifications  See medications in patient instructions if given  Patient will follow up in 4-8 weeks     The above documentation has been reviewed and is accurate and complete Tim Saldierna M Addilyne Backs, DO         Note: This dictation was prepared with Dragon dictation along with smaller phrase technology. Any transcriptional errors that result from this process are unintentional.

## 2024-02-26 ENCOUNTER — Encounter: Payer: Self-pay | Admitting: Family Medicine

## 2024-02-26 ENCOUNTER — Ambulatory Visit: Admitting: Family Medicine

## 2024-02-26 VITALS — BP 124/70 | HR 99 | Ht 69.0 in | Wt 243.0 lb

## 2024-02-26 DIAGNOSIS — M9903 Segmental and somatic dysfunction of lumbar region: Secondary | ICD-10-CM

## 2024-02-26 DIAGNOSIS — M5136 Other intervertebral disc degeneration, lumbar region with discogenic back pain only: Secondary | ICD-10-CM | POA: Diagnosis not present

## 2024-02-26 DIAGNOSIS — M9902 Segmental and somatic dysfunction of thoracic region: Secondary | ICD-10-CM | POA: Diagnosis not present

## 2024-02-26 DIAGNOSIS — M9904 Segmental and somatic dysfunction of sacral region: Secondary | ICD-10-CM

## 2024-02-26 DIAGNOSIS — M9901 Segmental and somatic dysfunction of cervical region: Secondary | ICD-10-CM | POA: Diagnosis not present

## 2024-02-26 DIAGNOSIS — L570 Actinic keratosis: Secondary | ICD-10-CM | POA: Diagnosis not present

## 2024-02-26 DIAGNOSIS — L820 Inflamed seborrheic keratosis: Secondary | ICD-10-CM | POA: Diagnosis not present

## 2024-02-26 DIAGNOSIS — Z85828 Personal history of other malignant neoplasm of skin: Secondary | ICD-10-CM | POA: Diagnosis not present

## 2024-02-26 DIAGNOSIS — H6982 Other specified disorders of Eustachian tube, left ear: Secondary | ICD-10-CM | POA: Diagnosis not present

## 2024-02-26 DIAGNOSIS — M9908 Segmental and somatic dysfunction of rib cage: Secondary | ICD-10-CM

## 2024-02-26 DIAGNOSIS — L814 Other melanin hyperpigmentation: Secondary | ICD-10-CM | POA: Diagnosis not present

## 2024-02-26 NOTE — Assessment & Plan Note (Signed)
 Chronic problem with exacerbation.  Discussed icing regimen and home exercises, which activities are doing which ones to avoid.  Continuing to work on core strength.  Does do a lot of traveling.  Discussed the potential for Toradol  and Depo-Medrol  injections but is having procedure on his side tomorrow and I did not feel it was a good idea today but could come back if he gets approval from his surgeon for after the surgery.  Follow-up again in 6 to 8 weeks

## 2024-02-26 NOTE — Patient Instructions (Addendum)
 Good to see you! Got a lot of movement today  Keep up with exercises If you need injection just write us  See you again in 2 months

## 2024-02-27 DIAGNOSIS — E113292 Type 2 diabetes mellitus with mild nonproliferative diabetic retinopathy without macular edema, left eye: Secondary | ICD-10-CM | POA: Diagnosis not present

## 2024-02-28 ENCOUNTER — Telehealth: Payer: Self-pay | Admitting: Family Medicine

## 2024-02-28 NOTE — Telephone Encounter (Signed)
 Pt was here yesterday, stated that he could not get the injections he needed because of an eye procedure he was having today. Stated that a nurse here could give those injections and he was told to just call us . Needs done today or tomorrow am as he is travelling.  Was not confidant in scheduling a Nurse visit for this as patient was told to send a MyChart message.

## 2024-02-28 NOTE — Telephone Encounter (Signed)
 Called patient and schedule a nurse only appointment tomorrow morning.

## 2024-02-29 ENCOUNTER — Ambulatory Visit (INDEPENDENT_AMBULATORY_CARE_PROVIDER_SITE_OTHER)

## 2024-02-29 DIAGNOSIS — K080 Exfoliation of teeth due to systemic causes: Secondary | ICD-10-CM | POA: Diagnosis not present

## 2024-02-29 DIAGNOSIS — M5136 Other intervertebral disc degeneration, lumbar region with discogenic back pain only: Secondary | ICD-10-CM | POA: Diagnosis not present

## 2024-02-29 MED ORDER — METHYLPREDNISOLONE ACETATE 80 MG/ML IJ SUSP
80.0000 mg | Freq: Once | INTRAMUSCULAR | Status: AC
  Administered 2024-02-29: 80 mg via INTRAMUSCULAR

## 2024-02-29 MED ORDER — KETOROLAC TROMETHAMINE 60 MG/2ML IM SOLN
60.0000 mg | Freq: Once | INTRAMUSCULAR | Status: AC
  Administered 2024-02-29: 60 mg via INTRAMUSCULAR

## 2024-02-29 NOTE — Progress Notes (Signed)
 Patient received Toradol  60 mg in left buttocks and Methylprednisolone  80 mg in right buttocks. Patient tolerated well.

## 2024-03-04 ENCOUNTER — Other Ambulatory Visit: Payer: Self-pay | Admitting: Cardiology

## 2024-03-04 DIAGNOSIS — E782 Mixed hyperlipidemia: Secondary | ICD-10-CM

## 2024-03-04 DIAGNOSIS — I2510 Atherosclerotic heart disease of native coronary artery without angina pectoris: Secondary | ICD-10-CM

## 2024-03-04 DIAGNOSIS — I350 Nonrheumatic aortic (valve) stenosis: Secondary | ICD-10-CM

## 2024-03-04 DIAGNOSIS — I119 Hypertensive heart disease without heart failure: Secondary | ICD-10-CM

## 2024-03-12 ENCOUNTER — Ambulatory Visit (HOSPITAL_BASED_OUTPATIENT_CLINIC_OR_DEPARTMENT_OTHER)
Admission: RE | Admit: 2024-03-12 | Discharge: 2024-03-12 | Disposition: A | Discharge status: Home / Self Care | Source: Ambulatory Visit | Attending: Cardiology | Admitting: Cardiology

## 2024-03-12 DIAGNOSIS — I350 Nonrheumatic aortic (valve) stenosis: Secondary | ICD-10-CM | POA: Diagnosis not present

## 2024-03-12 DIAGNOSIS — I2510 Atherosclerotic heart disease of native coronary artery without angina pectoris: Secondary | ICD-10-CM | POA: Insufficient documentation

## 2024-03-13 LAB — ECHOCARDIOGRAM COMPLETE
AR max vel: 0.7 cm2
AV Area VTI: 0.81 cm2
AV Area mean vel: 0.79 cm2
AV Mean grad: 25 mmHg
AV Peak grad: 41.9 mmHg
Ao pk vel: 3.24 m/s
Area-P 1/2: 2.56 cm2
Calc EF: 63.3 %
S' Lateral: 2.5 cm
Single Plane A2C EF: 65.7 %
Single Plane A4C EF: 62 %

## 2024-03-14 ENCOUNTER — Encounter: Payer: Self-pay | Admitting: Cardiology

## 2024-03-17 ENCOUNTER — Ambulatory Visit: Payer: Self-pay | Admitting: Cardiology

## 2024-03-18 ENCOUNTER — Telehealth: Payer: Self-pay

## 2024-03-18 NOTE — Telephone Encounter (Signed)
 Left message on My Chart with Echo results per Dr. Vanetta Shawl note. Routed to PCP.

## 2024-03-26 ENCOUNTER — Telehealth: Payer: Self-pay

## 2024-03-26 DIAGNOSIS — E113592 Type 2 diabetes mellitus with proliferative diabetic retinopathy without macular edema, left eye: Secondary | ICD-10-CM | POA: Diagnosis not present

## 2024-03-26 NOTE — Telephone Encounter (Signed)
 Pt viewed Echo results on My Chart per Dr. Vanetta Shawl note. Routed to PCP.

## 2024-04-10 DIAGNOSIS — K5730 Diverticulosis of large intestine without perforation or abscess without bleeding: Secondary | ICD-10-CM | POA: Insufficient documentation

## 2024-04-10 DIAGNOSIS — Z860100 Personal history of colon polyps, unspecified: Secondary | ICD-10-CM | POA: Insufficient documentation

## 2024-04-10 DIAGNOSIS — R0683 Snoring: Secondary | ICD-10-CM | POA: Insufficient documentation

## 2024-04-11 ENCOUNTER — Encounter: Payer: Self-pay | Admitting: Cardiology

## 2024-04-11 ENCOUNTER — Ambulatory Visit: Attending: Cardiology | Admitting: Cardiology

## 2024-04-11 VITALS — BP 110/80 | HR 93 | Ht 69.0 in | Wt 239.0 lb

## 2024-04-11 DIAGNOSIS — E119 Type 2 diabetes mellitus without complications: Secondary | ICD-10-CM | POA: Diagnosis not present

## 2024-04-11 DIAGNOSIS — I350 Nonrheumatic aortic (valve) stenosis: Secondary | ICD-10-CM

## 2024-04-11 DIAGNOSIS — I119 Hypertensive heart disease without heart failure: Secondary | ICD-10-CM | POA: Diagnosis not present

## 2024-04-11 DIAGNOSIS — I2510 Atherosclerotic heart disease of native coronary artery without angina pectoris: Secondary | ICD-10-CM

## 2024-04-11 DIAGNOSIS — E669 Obesity, unspecified: Secondary | ICD-10-CM

## 2024-04-11 NOTE — Progress Notes (Signed)
 Cardiology Office Note:    Date:  04/11/2024   ID:  Tim Cook, DOB [birth year], MRN [id number]  PCP:  Cristopher Bottcher, NP  Cardiologist:  Lamar Fitch, MD    Referring MD: Cristopher Bottcher, NP   Chief Complaint  Patient presents with   Follow-up    History of Present Illness:    Tim Cook is a 63 y.o. male past medical history significant for coronary artery disease.  In 2017 he did have stent to proximal LAD as well as mid LAD for 90% stenosis, additional problem include essential hypertension, diabetes, dyslipidemia, obstructive sleep apnea.  Recently she did have a stress test done which was negative also got echocardiogram which surprisingly showed aortic stenosis which appears to be moderate to severe calculated aortic valve area is less than 1 cm squire, 0.78, mean gradient was 25, dimensionless index was 0.26 and stroke-volume was diminished 21.  Interestingly also on physical exam he have a murmur mid peaking and S2 is still present there was no correlation between degree of stenosis that we did see on echocardiogram and physical exam therefore we decided to repeat echocardiogram echocardiogram shows similar findings.  Looks like he got moderate to severe aortic stenosis which is probably low-flow low gradient.  He recently come back from [employer] and he said he walk around all day with no difficulty so look like this is asymptomatic aortic stenosis.  Past Medical History:  Diagnosis Date   Acute lateral meniscal tear 06/09/2016   Arthritis    knees, lower back, hands (05/18/2016)   Atherosclerotic heart disease of native coronary artery without angina pectoris    05/18/16  Cath severe 90% LAD prox, 99% mid, Circ dominant with 30% OM1, 50% OM2, 30% OM3, 60% distal circ and PL, severe stenosis nondominant RCA.  EF 55%  3.0x 20, 2.5 x 20 mm, 2.25 x 16 mm Promus premier sent to LAD Dr. Verlin    Bariatric surgery status 05/18/2016   Benign essential hypertension  01/28/2021   Bursitis of right shoulder 01/21/2019   CAD (coronary artery disease), native coronary artery    05/18/16  Cath severe 90% LAD prox, 99% mid, Circ dominant with 30% OM1, 50% OM2, 30% OM3, 60% distal circ and PL, severe stenosis nondominant RCA.  EF 55%  3.0x 20, 2.5 x 20 mm, 2.25 x 16 mm Promus premier sent to LAD Dr. Verlin    Chronic bronchitis (HCC)    most years (05/18/2016)   Chronic lower back pain    Closed fracture of right distal radius 03/12/2020   Closed fracture of right proximal humerus 03/12/2020   DDD (degenerative disc disease), lumbar    Degenerative arthritis of right knee 05/09/2017   Degenerative disc disease, lumbar 07/07/2016   Degenerative lumbar spinal stenosis 04/07/2021   Diabetes mellitus without complication (HCC)    Dislocation of left middle finger 01/21/2019   Encounter for immunization 01/28/2021   Erectile dysfunction 01/28/2021   Gastro-esophageal reflux 01/28/2021   History of nutritional deficiency 10/26/2021   Hyperglycemia due to type 2 diabetes mellitus (HCC) 01/28/2021   Hyperlipidemia    Hypertensive heart disease without CHF    Lumbago with sciatica, right side 04/07/2021   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 type 2 diabetes mellitus (HCC) 01/28/2021   Mixed hyperlipidemia 05/18/2016   Nonallopathic lesion of lumbosacral region 07/07/2016   Nonallopathic lesion of sacral region 07/07/2016   Nonallopathic lesion of thoracic region 07/07/2016   Obesity  Radiculopathy, lumbar region 04/07/2021   Shoulder joint pain 01/28/2021   Type II diabetes mellitus (HCC)    Unstable angina (HCC)    Vitamin D  deficiency 01/28/2021    Past Surgical History:  Procedure Laterality Date   CARDIAC CATHETERIZATION N/A 05/18/2016   Procedure: Left Heart Cath and Coronary Angiography;  Surgeon: Lonni JONETTA Cash, MD;  Location: [facility] INVASIVE CV LAB;  Service: Cardiovascular;  Laterality: N/A;    CARDIAC CATHETERIZATION N/A 05/18/2016   Procedure: Coronary Stent Intervention;  Surgeon: Lonni JONETTA Cash, MD;  Location: [facility] INVASIVE CV LAB;  Service: Cardiovascular;  Laterality: N/A;   CARPAL TUNNEL RELEASE Right 03/17/2020   Procedure: CARPAL TUNNEL RELEASE;  Surgeon: Josefina Chew, MD;  Location: [facility];  Service: Orthopedics;  Laterality: Right;   CORONARY ANGIOPLASTY     FOOT FRACTURE SURGERY Right 1970s   LAPAROSCOPIC GASTRIC BANDING  07/2007   by Dr. Gladis   ORIF HUMERUS FRACTURE Right 03/17/2020   Procedure: OPEN REDUCTION INTERNAL FIXATION (ORIF) PROXIMAL HUMERUS FRACTURE AND DISTAL RADIUS FRACTURE;  Surgeon: Josefina Chew, MD;  Location: [facility];  Service: Orthopedics;  Laterality: Right;   PILONIDAL CYST EXCISION  ~ 1985   took 8 out all at once   SQUAMOUS CELL CARCINOMA EXCISION     right upper arm, left shoulder    Current Medications: Current Meds  Medication Sig   aspirin  EC 81 MG tablet Take 81 mg by mouth daily.   atorvastatin  (LIPITOR) 40 MG tablet Take 40 mg by mouth daily.   FARXIGA  10 MG TABS tablet Take 10 mg by mouth daily.    gabapentin  (NEURONTIN ) 100 MG capsule TAKE 2 CAPSULES(200 MG) BY MOUTH AT BEDTIME   HYDROcodone -acetaminophen  (NORCO) 10-325 MG tablet Take 1 tablet by mouth as needed for pain.   loratadine  (CLARITIN ) 10 MG tablet Take 10 mg by mouth daily.   losartan  (COZAAR ) 100 MG tablet Take 100 mg by mouth daily.   metFORMIN  (GLUCOPHAGE ) 500 MG tablet Take 1,000 mg by mouth 2 (two) times daily with a meal.   Multiple Vitamin (MULTIVITAMIN) capsule Take 1 capsule by mouth daily. Unknown strength   naproxen sodium (ALEVE) 220 MG tablet Take 220 mg by mouth daily as needed (pain).   nitroGLYCERIN  (NITROSTAT ) 0.4 MG SL tablet PLACE 1 TABLET UNDER THE TONGUE EVERY 5 MINUTES AS NEEDED FOR CHEST PAIN. (Patient taking differently: Place 0.4 mg under the tongue every 5 (five) minutes as needed for chest pain.)   tirzepatide (MOUNJARO) 10 MG/0.5ML Pen  Inject 10 mg into the skin once a week.   tiZANidine  (ZANAFLEX ) 2 MG tablet TAKE 1 TABLET BY MOUTH EVERYDAY AT BEDTIME (Patient taking differently: Take 2 mg by mouth at bedtime.)   Vitamin D , Ergocalciferol , (DRISDOL ) 1.25 MG (50000 UNIT) CAPS capsule TAKE 1 CAPSULE EVERY 7 DAYS (Patient taking differently: Take 50,000 Units by mouth every 7 (seven) days.)     Allergies:   Patient has no known allergies.   Social History   Socioeconomic History   Marital status: Married    Spouse name: Not on file   Number of children: Not on file   Years of education: Not on file   Highest education level: Not on file  Occupational History   Not on file  Tobacco Use   Smoking status: Former    Current packs/day: 0.00    Average packs/day: 0.5 packs/day for 12.0 years (6.0 ttl pk-yrs)    Types: Cigarettes    Start date: 12/15/1979  Quit date: 12/15/1991    Years since quitting: 32.3   Smokeless tobacco: Former    Types: Snuff   Tobacco comments:    quit snuff in ~ 1981  Vaping Use   Vaping status: Never Used  Substance and Sexual Activity   Alcohol use: Yes    Alcohol/week: 7.0 standard drinks of alcohol    Types: 7 Cans of beer per week    Comment: More social use   Drug use: No   Sexual activity: Yes  Other Topics Concern   Not on file  Social History Narrative   Not on file   Social Drivers of Health   Financial Resource Strain: Not on file  Food Insecurity: Not on file  Transportation Needs: Not on file  Physical Activity: Not on file  Stress: Not on file  Social Connections: Not on file     Family History: The patient's family history includes Diabetes Mellitus I in his sister; Heart attack in his father; Heart disease in his father, mother, and sister; Stroke in his brother and father. ROS:   Please see the history of present illness.    All 14 point review of systems negative except as described per history of present illness  EKGs/Labs/Other Studies Reviewed:          Recent Labs: No results found for requested labs within last 365 days.  Recent Lipid Panel    Component Value Date/Time   CHOL 125 12/07/2018 1520   TRIG 103 12/07/2018 1520   HDL 57 12/07/2018 1520   CHOLHDL 2.2 12/07/2018 1520   LDLCALC 47 12/07/2018 1520    Physical Exam:    VS:  BP 110/80 (BP Location: Left Arm, Patient Position: Sitting)   Pulse 93   Ht 5' 9 (1.753 m)   Wt 239 lb (108.4 kg)   SpO2 98%   BMI 35.29 kg/m     Wt Readings from Last 3 Encounters:  04/11/24 239 lb (108.4 kg)  02/26/24 243 lb (110.2 kg)  02/05/24 239 lb (108.4 kg)     GEN:  Well nourished, well developed in no acute distress HEENT: Normal NECK: No JVD; No carotid bruits LYMPHATICS: No lymphadenopathy CARDIAC: RRR, stock ejection murmur grade 2/6 best heard right upper portion of the sternum mid-to-late peaking, S2 is still present, no rubs, no gallops RESPIRATORY:  Clear to auscultation without rales, wheezing or rhonchi  ABDOMEN: Soft, non-tender, non-distended MUSCULOSKELETAL:  No edema; No deformity  SKIN: Warm and dry LOWER EXTREMITIES: no swelling NEUROLOGIC:  Alert and oriented x 3 PSYCHIATRIC:  Normal affect   ASSESSMENT:    1. Nonrheumatic aortic valve stenosis   2. Coronary artery disease involving native coronary artery of native heart without angina pectoris   3. Hypertensive heart disease without CHF   4. Diabetes mellitus without complication (HCC)   5. Obesity (BMI 35.0-39.9 without comorbidity)    PLAN:    In order of problems listed above:  Discrepancy between physical exam symptoms and degree of stenosis that we see on the echocardiogram indicating moderate to severe aortic stenosis.  It is possible that he had bicuspid aortic valve however that has not been described as such I would bicuspid aortic valve and stenosis we can still hear S2.  I had a long discussion with him about what to do with the situation I will schedule him to have calcium  score of  the aortic valve, he also will be referred to our structural heart team Dr. Wonda for evaluation.  He may require cardiac catheterization to 1 more time to look at his aortic stenosis as well as coronary arteries. Coronary disease status post angioplasty in 2017 asymptomatic no chest pain tightness squeezing pressure burning chest. Dyslipidemia I did review K PN which show me his LDL 59 HDL 55 this is from January of this year good control continue present management. Diabetes last hemoglobin A1c 6.3 we will continue monitoring   Medication Adjustments/Labs and Tests Ordered: Current medicines are reviewed at length with the patient today.  Concerns regarding medicines are outlined above.  No orders of the defined types were placed in this encounter.  Medication changes: No orders of the defined types were placed in this encounter.   Signed, Lamar DOROTHA Fitch, MD, [facility] 04/11/2024 3:47 PM    [facility]

## 2024-04-11 NOTE — Patient Instructions (Addendum)
 Medication Instructions:  Your physician recommends that you continue on your current medications as directed. Please refer to the Current Medication list given to you today.  *If you need a refill on your cardiac medications before your next appointment, please call your pharmacy*   Lab Work: None Ordered If you have labs (blood work) drawn today and your tests are completely normal, you will receive your results only by: MyChart Message (if you have MyChart) OR A paper copy in the mail If you have any lab test that is abnormal or we need to change your treatment, we will call you to review the results.   Testing/Procedures: We will order CT coronary calcium  score. It will cost $99.00 and is not covered by insurance.  Please call to schedule.    [facility] [address] [place], [place] [address] [phone]      Follow-Up: At [facility], you and your health needs are our priority.  As part of our continuing mission to provide you with exceptional heart care, we have created designated Provider Care Teams.  These Care Teams include your primary Cardiologist (physician) and Advanced Practice Providers (APPs -  Physician Assistants and Nurse Practitioners) who all work together to provide you with the care you need, when you need it.  We recommend signing up for the patient portal called MyChart.  Sign up information is provided on this After Visit Summary.  MyChart is used to connect with patients for Virtual Visits (Telemedicine).  Patients are able to view lab/test results, encounter notes, upcoming appointments, etc.  Non-urgent messages can be sent to your provider as well.   To learn more about what you can do with MyChart, go to [email or web address].    Your next appointment:   3 month(s)  The format for your next appointment:   In Person  Provider:   Lamar Fitch, MD    Other Instructions Referral to Dr. Wonda - They will call for appt

## 2024-04-11 NOTE — Addendum Note (Signed)
 Addended by: ARLOA PLANAS D on: 04/11/2024 04:12 PM   Modules accepted: Orders

## 2024-04-12 ENCOUNTER — Ambulatory Visit (HOSPITAL_BASED_OUTPATIENT_CLINIC_OR_DEPARTMENT_OTHER)
Admission: RE | Admit: 2024-04-12 | Discharge: 2024-04-12 | Disposition: A | Discharge status: Home / Self Care | Payer: Self-pay | Source: Ambulatory Visit | Attending: Cardiology | Admitting: Cardiology

## 2024-04-12 DIAGNOSIS — I2510 Atherosclerotic heart disease of native coronary artery without angina pectoris: Secondary | ICD-10-CM | POA: Diagnosis not present

## 2024-04-12 DIAGNOSIS — E1165 Type 2 diabetes mellitus with hyperglycemia: Secondary | ICD-10-CM | POA: Diagnosis not present

## 2024-04-12 DIAGNOSIS — Z9884 Bariatric surgery status: Secondary | ICD-10-CM | POA: Diagnosis not present

## 2024-04-12 DIAGNOSIS — Z8639 Personal history of other endocrine, nutritional and metabolic disease: Secondary | ICD-10-CM | POA: Diagnosis not present

## 2024-04-12 DIAGNOSIS — E782 Mixed hyperlipidemia: Secondary | ICD-10-CM | POA: Diagnosis not present

## 2024-04-12 DIAGNOSIS — Z0000 Encounter for general adult medical examination without abnormal findings: Secondary | ICD-10-CM | POA: Diagnosis not present

## 2024-04-12 DIAGNOSIS — G894 Chronic pain syndrome: Secondary | ICD-10-CM | POA: Diagnosis not present

## 2024-04-12 DIAGNOSIS — I350 Nonrheumatic aortic (valve) stenosis: Secondary | ICD-10-CM | POA: Insufficient documentation

## 2024-04-12 DIAGNOSIS — I10 Essential (primary) hypertension: Secondary | ICD-10-CM | POA: Diagnosis not present

## 2024-04-12 DIAGNOSIS — E113393 Type 2 diabetes mellitus with moderate nonproliferative diabetic retinopathy without macular edema, bilateral: Secondary | ICD-10-CM | POA: Diagnosis not present

## 2024-04-12 DIAGNOSIS — Z125 Encounter for screening for malignant neoplasm of prostate: Secondary | ICD-10-CM | POA: Diagnosis not present

## 2024-04-15 ENCOUNTER — Encounter: Payer: Self-pay | Admitting: Cardiology

## 2024-04-16 ENCOUNTER — Telehealth: Payer: Self-pay

## 2024-04-16 NOTE — Telephone Encounter (Signed)
 Sent message to front desk to make appt to discuss cardiac cath

## 2024-04-18 ENCOUNTER — Ambulatory Visit: Payer: Self-pay | Admitting: Cardiology

## 2024-04-22 DIAGNOSIS — Z7902 Long term (current) use of antithrombotics/antiplatelets: Secondary | ICD-10-CM | POA: Insufficient documentation

## 2024-04-22 DIAGNOSIS — I359 Nonrheumatic aortic valve disorder, unspecified: Secondary | ICD-10-CM | POA: Insufficient documentation

## 2024-04-23 ENCOUNTER — Encounter: Payer: Self-pay | Admitting: Cardiology

## 2024-04-23 ENCOUNTER — Ambulatory Visit: Attending: Cardiology | Admitting: Cardiology

## 2024-04-23 VITALS — BP 133/83 | HR 105 | Ht 69.0 in | Wt 236.0 lb

## 2024-04-23 DIAGNOSIS — E1169 Type 2 diabetes mellitus with other specified complication: Secondary | ICD-10-CM | POA: Diagnosis not present

## 2024-04-23 DIAGNOSIS — E119 Type 2 diabetes mellitus without complications: Secondary | ICD-10-CM

## 2024-04-23 DIAGNOSIS — I350 Nonrheumatic aortic (valve) stenosis: Secondary | ICD-10-CM | POA: Diagnosis not present

## 2024-04-23 DIAGNOSIS — I119 Hypertensive heart disease without heart failure: Secondary | ICD-10-CM | POA: Diagnosis not present

## 2024-04-23 DIAGNOSIS — Z6836 Body mass index (BMI) 36.0-36.9, adult: Secondary | ICD-10-CM | POA: Diagnosis not present

## 2024-04-23 DIAGNOSIS — I10 Essential (primary) hypertension: Secondary | ICD-10-CM | POA: Diagnosis not present

## 2024-04-23 DIAGNOSIS — E782 Mixed hyperlipidemia: Secondary | ICD-10-CM

## 2024-04-23 DIAGNOSIS — I2510 Atherosclerotic heart disease of native coronary artery without angina pectoris: Secondary | ICD-10-CM

## 2024-04-23 NOTE — Progress Notes (Unsigned)
 Cardiology Office Note:    Date:  04/23/2024   ID:  Tim Cook, DOB 06-Apr-1961, MRN [id number]  PCP:  Cristopher Bottcher, NP  Cardiologist:  Lamar Fitch, MD    Referring MD: Cristopher Bottcher, NP   Chief Complaint  Patient presents with   Results    History of Present Illness:    Tim Cook is a 63 y.o. male  past medical history significant for coronary artery disease. In 2017 he did have stent to proximal LAD as well as mid LAD for 90% stenosis, additional problem include essential hypertension, diabetes, dyslipidemia, obstructive sleep apnea. Recently she did have a stress test done which was negative also got echocardiogram which surprisingly showed aortic stenosis which appears to be moderate to severe calculated aortic valve area is less than 1 cm squire, 0.78, mean gradient was 25, dimensionless index was 0.26 and stroke-volume was diminished 21. Interestingly also on physical exam he have a murmur mid peaking and S2 is still present there was no correlation between degree of stenosis that we did see on echocardiogram and physical exam therefore we decided to repeat echocardiogram echocardiogram shows similar findings. Looks like he got moderate to severe aortic stenosis which is probably low-flow low gradient.  Just to understand this will be better he did have a calcium  score of the aortic valve done which is more than 2000, exactly 2268.  Interestingly he does not have much symptoms.  Past Medical History:  Diagnosis Date   Acute lateral meniscal tear 06/09/2016   Arthritis    knees, lower back, hands (05/18/2016)   Atherosclerotic heart disease of native coronary artery without angina pectoris    05/18/16  Cath severe 90% LAD prox, 99% mid, Circ dominant with 30% OM1, 50% OM2, 30% OM3, 60% distal circ and PL, severe stenosis nondominant RCA.  EF 55%  3.0x 20, 2.5 x 20 mm, 2.25 x 16 mm Promus premier sent to LAD Dr. Verlin    Bariatric surgery status 05/18/2016   Benign  essential hypertension 01/28/2021   Bursitis of right shoulder 01/21/2019   CAD (coronary artery disease), native coronary artery    05/18/16  Cath severe 90% LAD prox, 99% mid, Circ dominant with 30% OM1, 50% OM2, 30% OM3, 60% distal circ and PL, severe stenosis nondominant RCA.  EF 55%  3.0x 20, 2.5 x 20 mm, 2.25 x 16 mm Promus premier sent to LAD Dr. Verlin    Chronic bronchitis (HCC)    most years (05/18/2016)   Chronic lower back pain    Closed fracture of right distal radius 03/12/2020   Closed fracture of right proximal humerus 03/12/2020   DDD (degenerative disc disease), lumbar    Degenerative arthritis of right knee 05/09/2017   Degenerative disc disease, lumbar 07/07/2016   Degenerative lumbar spinal stenosis 04/07/2021   Diabetes mellitus without complication (HCC)    Dislocation of left middle finger 01/21/2019   Encounter for immunization 01/28/2021   Erectile dysfunction 01/28/2021   Gastro-esophageal reflux 01/28/2021   History of nutritional deficiency 10/26/2021   Hyperglycemia due to type 2 diabetes mellitus (HCC) 01/28/2021   Hyperlipidemia    Hypertensive heart disease without CHF    Lumbago with sciatica, right side 04/07/2021   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 type 2 diabetes mellitus (HCC) 01/28/2021   Mixed hyperlipidemia 05/18/2016   Nonallopathic lesion of lumbosacral region 07/07/2016   Nonallopathic lesion of sacral region 07/07/2016   Nonallopathic lesion of thoracic region 07/07/2016  Obesity    Radiculopathy, lumbar region 04/07/2021   Shoulder joint pain 01/28/2021   Type II diabetes mellitus (HCC)    Unstable angina (HCC)    Vitamin D  deficiency 01/28/2021    Past Surgical History:  Procedure Laterality Date   CARDIAC CATHETERIZATION N/A 05/18/2016   Procedure: Left Heart Cath and Coronary Angiography;  Surgeon: Lonni JONETTA Cash, MD;  Location[facility] INVASIVE CV LAB;  Service: Cardiovascular;   Laterality: N/A;   CARDIAC CATHETERIZATION N/A 05/18/2016   Procedure: Coronary Stent Intervention;  Surgeon: Lonni JONETTA Cash, MD;  Location: [facility] INVASIVE CV LAB;  Service: Cardiovascular;  Laterality: N/A;   CARPAL TUNNEL RELEASE Right 03/17/2020   Procedure: CARPAL TUNNEL RELEASE;  Surgeon: Josefina Chew, MD;  Location: [facility];  Service: Orthopedics;  Laterality: Right;   CORONARY ANGIOPLASTY     FOOT FRACTURE SURGERY Right 1970s   LAPAROSCOPIC GASTRIC BANDING  07/2007   by Dr. Gladis   ORIF HUMERUS FRACTURE Right 03/17/2020   Procedure: OPEN REDUCTION INTERNAL FIXATION (ORIF) PROXIMAL HUMERUS FRACTURE AND DISTAL RADIUS FRACTURE;  Surgeon: Josefina Chew, MD;  Location: [facility];  Service: Orthopedics;  Laterality: Right;   PILONIDAL CYST EXCISION  ~ 1985   took 8 out all at once   SQUAMOUS CELL CARCINOMA EXCISION     right upper arm, left shoulder    Current Medications: Current Meds  Medication Sig   aspirin  EC 81 MG tablet Take 81 mg by mouth daily.   atorvastatin  (LIPITOR) 40 MG tablet Take 40 mg by mouth daily.   FARXIGA  10 MG TABS tablet Take 10 mg by mouth daily.    gabapentin  (NEURONTIN ) 100 MG capsule TAKE 2 CAPSULES(200 MG) BY MOUTH AT BEDTIME   HYDROcodone -acetaminophen  (NORCO) 10-325 MG tablet Take 1 tablet by mouth as needed for pain.   loratadine  (CLARITIN ) 10 MG tablet Take 10 mg by mouth daily.   losartan  (COZAAR ) 100 MG tablet Take 100 mg by mouth daily.   metFORMIN  (GLUCOPHAGE ) 500 MG tablet Take 1,000 mg by mouth 2 (two) times daily with a meal.   Multiple Vitamin (MULTIVITAMIN) capsule Take 1 capsule by mouth daily. Unknown strength   naproxen sodium (ALEVE) 220 MG tablet Take 220 mg by mouth daily as needed (pain).   nitroGLYCERIN  (NITROSTAT ) 0.4 MG SL tablet PLACE 1 TABLET UNDER THE TONGUE EVERY 5 MINUTES AS NEEDED FOR CHEST PAIN. (Patient taking differently: Place 0.4 mg under the tongue every 5 (five) minutes as needed for chest pain.)   tirzepatide  (MOUNJARO) 10 MG/0.5ML Pen Inject 10 mg into the skin once a week.   tiZANidine  (ZANAFLEX ) 2 MG tablet TAKE 1 TABLET BY MOUTH EVERYDAY AT BEDTIME (Patient taking differently: Take 2 mg by mouth at bedtime.)   Vitamin D , Ergocalciferol , (DRISDOL ) 1.25 MG (50000 UNIT) CAPS capsule TAKE 1 CAPSULE EVERY 7 DAYS (Patient taking differently: Take 50,000 Units by mouth every 7 (seven) days.)     Allergies:   Patient has no known allergies.   Social History   Socioeconomic History   Marital status: Married    Spouse name: Not on file   Number of children: Not on file   Years of education: Not on file   Highest education level: Not on file  Occupational History   Not on file  Tobacco Use   Smoking status: Former    Current packs/day: 0.00    Average packs/day: 0.5 packs/day for 12.0 years (6.0 ttl pk-yrs)    Types: Cigarettes    Start date:  12/15/1979    Quit date: 12/15/1991    Years since quitting: 32.3   Smokeless tobacco: Former    Types: Snuff   Tobacco comments:    quit snuff in ~ 1981  Vaping Use   Vaping status: Never Used  Substance and Sexual Activity   Alcohol use: Yes    Alcohol/week: 7.0 standard drinks of alcohol    Types: 7 Cans of beer per week    Comment: More social use   Drug use: No   Sexual activity: Yes  Other Topics Concern   Not on file  Social History Narrative   Not on file   Social Drivers of Health   Financial Resource Strain: Not on file  Food Insecurity: Not on file  Transportation Needs: Not on file  Physical Activity: Not on file  Stress: Not on file  Social Connections: Not on file     Family History: The patient's family history includes Diabetes Mellitus I in his sister; Heart attack in his father; Heart disease in his father, mother, and sister; Stroke in his brother and father. ROS:   Please see the history of present illness.    All 14 point review of systems negative except as described per history of present  illness  EKGs/Labs/Other Studies Reviewed:    EKG Interpretation Date/Time:  Tuesday April 23 2024 08:17:11 EDT Ventricular Rate:  103 PR Interval:  130 QRS Duration:  86 QT Interval:  358 QTC Calculation: 468 R Axis:   -8  Text Interpretation: Sinus tachycardia When compared with ECG of 26-Dec-2023 10:09, No significant change was found Confirmed by Bernie Charleston [phone]) on 04/23/2024 8:28:29 AM    Recent Labs: No results found for requested labs within last 365 days.  Recent Lipid Panel    Component Value Date/Time   CHOL 125 12/07/2018 1520   TRIG 103 12/07/2018 1520   HDL 57 12/07/2018 1520   CHOLHDL 2.2 12/07/2018 1520   LDLCALC 47 12/07/2018 1520    Physical Exam:    VS:  BP 133/83 (BP Location: Left Arm, Patient Position: Sitting)   Pulse (!) 105   Ht 5' 9 (1.753 m)   Wt 236 lb (107 kg)   SpO2 93%   BMI 34.85 kg/m     Wt Readings from Last 3 Encounters:  04/23/24 236 lb (107 kg)  04/11/24 239 lb (108.4 kg)  02/26/24 243 lb (110.2 kg)     GEN:  Well nourished, well developed in no acute distress HEENT: Normal NECK: No JVD; No carotid bruits LYMPHATICS: No lymphadenopathy CARDIAC: RRR, stock ejection murmur mid to late peaking with S2 still present, no rubs, no gallops RESPIRATORY:  Clear to auscultation without rales, wheezing or rhonchi  ABDOMEN: Soft, non-tender, non-distended MUSCULOSKELETAL:  No edema; No deformity  SKIN: Warm and dry LOWER EXTREMITIES: no swelling NEUROLOGIC:  Alert and oriented x 3 PSYCHIATRIC:  Normal affect   ASSESSMENT:    1. Benign essential hypertension   2. Nonrheumatic aortic valve stenosis   3. Atherosclerosis of native coronary artery of native heart without angina pectoris   4. Hypertensive heart disease without CHF   5. Diabetes mellitus without complication (HCC)   6. Mixed hyperlipidemia    PLAN:    In order of problems listed above:  Aortic stenosis looks like it is severe, multiple parameters on the  echocardiogram as well as presence of severe calcification of the aortic valve more than 2000 indicate severe aortic stenosis.  He does not have much  symptoms but I am worried he may simply downgrading his symptomatology.  The plan will be to do cardiac catheterization to look at his coronaries as well as look at the valves.  Will schedule him to have this done with Dr. Wonda.  Procedure has been explained to him including all risk benefits as well as alternatives.  Will do left and right cardiac catheterization.  I explained to him way to fix aortic valve either surgical intervention versus TAVI.  He understood Benign essential hypertension blood pressure well-controlled continue present management. Dyslipidemia I did review K PN which show me LDL 44 HDL 48 excellent control continue present management which include Lipitor 40 which is high intensity statin. Coronary artery disease for quite advanced disease before.  Will do cardiac catheterization.  Continue antiplatelet therapy.   Medication Adjustments/Labs and Tests Ordered: Current medicines are reviewed at length with the patient today.  Concerns regarding medicines are outlined above.  Orders Placed This Encounter  Procedures   EKG 12-Lead   Medication changes: No orders of the defined types were placed in this encounter.   Signed, Lamar DOROTHA Fitch, MD, [facility] 04/23/2024 8:43 AM    [facility]

## 2024-04-23 NOTE — H&P (View-Only) (Signed)
 Cardiology Office Note:    Date:  04/23/2024   ID:  Tim Cook, DOB 06-Apr-1961, MRN [id number]  PCP:  Cristopher Bottcher, NP  Cardiologist:  Lamar Fitch, MD    Referring MD: Cristopher Bottcher, NP   Chief Complaint  Patient presents with   Results    History of Present Illness:    Tim Cook is a 63 y.o. male  past medical history significant for coronary artery disease. In 2017 he did have stent to proximal LAD as well as mid LAD for 90% stenosis, additional problem include essential hypertension, diabetes, dyslipidemia, obstructive sleep apnea. Recently she did have a stress test done which was negative also got echocardiogram which surprisingly showed aortic stenosis which appears to be moderate to severe calculated aortic valve area is less than 1 cm squire, 0.78, mean gradient was 25, dimensionless index was 0.26 and stroke-volume was diminished 21. Interestingly also on physical exam he have a murmur mid peaking and S2 is still present there was no correlation between degree of stenosis that we did see on echocardiogram and physical exam therefore we decided to repeat echocardiogram echocardiogram shows similar findings. Looks like he got moderate to severe aortic stenosis which is probably low-flow low gradient.  Just to understand this will be better he did have a calcium  score of the aortic valve done which is more than 2000, exactly 2268.  Interestingly he does not have much symptoms.  Past Medical History:  Diagnosis Date   Acute lateral meniscal tear 06/09/2016   Arthritis    knees, lower back, hands (05/18/2016)   Atherosclerotic heart disease of native coronary artery without angina pectoris    05/18/16  Cath severe 90% LAD prox, 99% mid, Circ dominant with 30% OM1, 50% OM2, 30% OM3, 60% distal circ and PL, severe stenosis nondominant RCA.  EF 55%  3.0x 20, 2.5 x 20 mm, 2.25 x 16 mm Promus premier sent to LAD Dr. Verlin    Bariatric surgery status 05/18/2016   Benign  essential hypertension 01/28/2021   Bursitis of right shoulder 01/21/2019   CAD (coronary artery disease), native coronary artery    05/18/16  Cath severe 90% LAD prox, 99% mid, Circ dominant with 30% OM1, 50% OM2, 30% OM3, 60% distal circ and PL, severe stenosis nondominant RCA.  EF 55%  3.0x 20, 2.5 x 20 mm, 2.25 x 16 mm Promus premier sent to LAD Dr. Verlin    Chronic bronchitis (HCC)    most years (05/18/2016)   Chronic lower back pain    Closed fracture of right distal radius 03/12/2020   Closed fracture of right proximal humerus 03/12/2020   DDD (degenerative disc disease), lumbar    Degenerative arthritis of right knee 05/09/2017   Degenerative disc disease, lumbar 07/07/2016   Degenerative lumbar spinal stenosis 04/07/2021   Diabetes mellitus without complication (HCC)    Dislocation of left middle finger 01/21/2019   Encounter for immunization 01/28/2021   Erectile dysfunction 01/28/2021   Gastro-esophageal reflux 01/28/2021   History of nutritional deficiency 10/26/2021   Hyperglycemia due to type 2 diabetes mellitus (HCC) 01/28/2021   Hyperlipidemia    Hypertensive heart disease without CHF    Lumbago with sciatica, right side 04/07/2021   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 type 2 diabetes mellitus (HCC) 01/28/2021   Mixed hyperlipidemia 05/18/2016   Nonallopathic lesion of lumbosacral region 07/07/2016   Nonallopathic lesion of sacral region 07/07/2016   Nonallopathic lesion of thoracic region 07/07/2016  Obesity    Radiculopathy, lumbar region 04/07/2021   Shoulder joint pain 01/28/2021   Type II diabetes mellitus (HCC)    Unstable angina (HCC)    Vitamin D  deficiency 01/28/2021    Past Surgical History:  Procedure Laterality Date   CARDIAC CATHETERIZATION N/A 05/18/2016   Procedure: Left Heart Cath and Coronary Angiography;  Surgeon: Lonni JONETTA Cash, MD;  Location: [facility] INVASIVE CV LAB;  Service: Cardiovascular;   Laterality: N/A;   CARDIAC CATHETERIZATION N/A 05/18/2016   Procedure: Coronary Stent Intervention;  Surgeon: Lonni JONETTA Cash, MD;  Location: [facility] INVASIVE CV LAB;  Service: Cardiovascular;  Laterality: N/A;   CARPAL TUNNEL RELEASE Right 03/17/2020   Procedure: CARPAL TUNNEL RELEASE;  Surgeon: Josefina Chew, MD;  Location: [facility] ORS;  Service: Orthopedics;  Laterality: Right;   CORONARY ANGIOPLASTY     FOOT FRACTURE SURGERY Right 1970s   LAPAROSCOPIC GASTRIC BANDING  07/2007   by Dr. Gladis   ORIF HUMERUS FRACTURE Right 03/17/2020   Procedure: OPEN REDUCTION INTERNAL FIXATION (ORIF) PROXIMAL HUMERUS FRACTURE AND DISTAL RADIUS FRACTURE;  Surgeon: Josefina Chew, MD;  Location: [facility];  Service: Orthopedics;  Laterality: Right;   PILONIDAL CYST EXCISION  ~ 1985   took 8 out all at once   SQUAMOUS CELL CARCINOMA EXCISION     right upper arm, left shoulder    Current Medications: Current Meds  Medication Sig   aspirin  EC 81 MG tablet Take 81 mg by mouth daily.   atorvastatin  (LIPITOR) 40 MG tablet Take 40 mg by mouth daily.   FARXIGA  10 MG TABS tablet Take 10 mg by mouth daily.    gabapentin  (NEURONTIN ) 100 MG capsule TAKE 2 CAPSULES(200 MG) BY MOUTH AT BEDTIME   HYDROcodone -acetaminophen  (NORCO) 10-325 MG tablet Take 1 tablet by mouth as needed for pain.   loratadine  (CLARITIN ) 10 MG tablet Take 10 mg by mouth daily.   losartan  (COZAAR ) 100 MG tablet Take 100 mg by mouth daily.   metFORMIN  (GLUCOPHAGE ) 500 MG tablet Take 1,000 mg by mouth 2 (two) times daily with a meal.   Multiple Vitamin (MULTIVITAMIN) capsule Take 1 capsule by mouth daily. Unknown strength   naproxen sodium (ALEVE) 220 MG tablet Take 220 mg by mouth daily as needed (pain).   nitroGLYCERIN  (NITROSTAT ) 0.4 MG SL tablet PLACE 1 TABLET UNDER THE TONGUE EVERY 5 MINUTES AS NEEDED FOR CHEST PAIN. (Patient taking differently: Place 0.4 mg under the tongue every 5 (five) minutes as needed for chest pain.)   tirzepatide  (MOUNJARO) 10 MG/0.5ML Pen Inject 10 mg into the skin once a week.   tiZANidine  (ZANAFLEX ) 2 MG tablet TAKE 1 TABLET BY MOUTH EVERYDAY AT BEDTIME (Patient taking differently: Take 2 mg by mouth at bedtime.)   Vitamin D , Ergocalciferol , (DRISDOL ) 1.25 MG (50000 UNIT) CAPS capsule TAKE 1 CAPSULE EVERY 7 DAYS (Patient taking differently: Take 50,000 Units by mouth every 7 (seven) days.)     Allergies:   Patient has no known allergies.   Social History   Socioeconomic History   Marital status: Married    Spouse name: Not on file   Number of children: Not on file   Years of education: Not on file   Highest education level: Not on file  Occupational History   Not on file  Tobacco Use   Smoking status: Former    Current packs/day: 0.00    Average packs/day: 0.5 packs/day for 12.0 years (6.0 ttl pk-yrs)    Types: Cigarettes    Start date:  12/15/1979    Quit date: 12/15/1991    Years since quitting: 32.3   Smokeless tobacco: Former    Types: Snuff   Tobacco comments:    quit snuff in ~ 1981  Vaping Use   Vaping status: Never Used  Substance and Sexual Activity   Alcohol use: Yes    Alcohol/week: 7.0 standard drinks of alcohol    Types: 7 Cans of beer per week    Comment: More social use   Drug use: No   Sexual activity: Yes  Other Topics Concern   Not on file  Social History Narrative   Not on file   Social Drivers of Health   Financial Resource Strain: Not on file  Food Insecurity: Not on file  Transportation Needs: Not on file  Physical Activity: Not on file  Stress: Not on file  Social Connections: Not on file     Family History: The patient's family history includes Diabetes Mellitus I in his sister; Heart attack in his father; Heart disease in his father, mother, and sister; Stroke in his brother and father. ROS:   Please see the history of present illness.    All 14 point review of systems negative except as described per history of present  illness  EKGs/Labs/Other Studies Reviewed:    EKG Interpretation Date/Time:  Tuesday April 23 2024 08:17:11 EDT Ventricular Rate:  103 PR Interval:  130 QRS Duration:  86 QT Interval:  358 QTC Calculation: 468 R Axis:   -8  Text Interpretation: Sinus tachycardia When compared with ECG of 26-Dec-2023 10:09, No significant change was found Confirmed by Bernie Charleston [phone]) on 04/23/2024 8:28:29 AM    Recent Labs: No results found for requested labs within last 365 days.  Recent Lipid Panel    Component Value Date/Time   CHOL 125 12/07/2018 1520   TRIG 103 12/07/2018 1520   HDL 57 12/07/2018 1520   CHOLHDL 2.2 12/07/2018 1520   LDLCALC 47 12/07/2018 1520    Physical Exam:    VS:  BP 133/83 (BP Location: Left Arm, Patient Position: Sitting)   Pulse (!) 105   Ht 5' 9 (1.753 m)   Wt 236 lb (107 kg)   SpO2 93%   BMI 34.85 kg/m     Wt Readings from Last 3 Encounters:  04/23/24 236 lb (107 kg)  04/11/24 239 lb (108.4 kg)  02/26/24 243 lb (110.2 kg)     GEN:  Well nourished, well developed in no acute distress HEENT: Normal NECK: No JVD; No carotid bruits LYMPHATICS: No lymphadenopathy CARDIAC: RRR, stock ejection murmur mid to late peaking with S2 still present, no rubs, no gallops RESPIRATORY:  Clear to auscultation without rales, wheezing or rhonchi  ABDOMEN: Soft, non-tender, non-distended MUSCULOSKELETAL:  No edema; No deformity  SKIN: Warm and dry LOWER EXTREMITIES: no swelling NEUROLOGIC:  Alert and oriented x 3 PSYCHIATRIC:  Normal affect   ASSESSMENT:    1. Benign essential hypertension   2. Nonrheumatic aortic valve stenosis   3. Atherosclerosis of native coronary artery of native heart without angina pectoris   4. Hypertensive heart disease without CHF   5. Diabetes mellitus without complication (HCC)   6. Mixed hyperlipidemia    PLAN:    In order of problems listed above:  Aortic stenosis looks like it is severe, multiple parameters on the  echocardiogram as well as presence of severe calcification of the aortic valve more than 2000 indicate severe aortic stenosis.  He does not have much  symptoms but I am worried he may simply downgrading his symptomatology.  The plan will be to do cardiac catheterization to look at his coronaries as well as look at the valves.  Will schedule him to have this done with Dr. Wonda.  Procedure has been explained to him including all risk benefits as well as alternatives.  Will do left and right cardiac catheterization.  I explained to him way to fix aortic valve either surgical intervention versus TAVI.  He understood Benign essential hypertension blood pressure well-controlled continue present management. Dyslipidemia I did review K PN which show me LDL 44 HDL 48 excellent control continue present management which include Lipitor 40 which is high intensity statin. Coronary artery disease for quite advanced disease before.  Will do cardiac catheterization.  Continue antiplatelet therapy.   Medication Adjustments/Labs and Tests Ordered: Current medicines are reviewed at length with the patient today.  Concerns regarding medicines are outlined above.  Orders Placed This Encounter  Procedures   EKG 12-Lead   Medication changes: No orders of the defined types were placed in this encounter.   Signed, Lamar DOROTHA Fitch, MD, [facility] 04/23/2024 8:43 AM    [facility]

## 2024-04-23 NOTE — Patient Instructions (Signed)
 Medication Instructions:  Hold: Mounjaro - 1 week prior to Procedure  Hold: Farxiga  - 3 days prior to procedure  Hold: Losartan - Day of Procedure  Hold: Metformin  - Day of procedure and 48 hours after procedure     *If you need a refill on your cardiac medications before your next appointment, please call your pharmacy*   Lab Work: BMP- CBC- within 30 days of procedure  If you have labs (blood work) drawn today and your tests are completely normal, you will receive your results only by: MyChart Message (if you have MyChart) OR A paper copy in the mail If you have any lab test that is abnormal or we need to change your treatment, we will call you to review the results.   Testing/Procedures:  [place] A DEPT OF [place] AT [facility] [place] [address] [place], [place] [address] [place] [address] Dept: [phone] Loc: [phone]  Tim Cook  04/23/2024  You are scheduled for a Cardiac Catheterization on Tuesday, August 19 with Dr. Ozell Fell.  1. Please arrive at the [facility] (Main Entrance A) at [facility]: [address] [place], [place] [address] at 10:00 AM (This time is 2 hour(s) before your procedure to ensure your preparation).   Free valet parking service is available. You will check in at ADMITTING. The support person will be asked to wait in the waiting room.  It is OK to have someone drop you off and come back when you are ready to be discharged.    Special note: Every effort is made to have your procedure done on time. Please understand that emergencies sometimes delay scheduled procedures.  2. Diet: No solid foods after midnight. You may have clear liquids until you leave for the hospital. (Light meal consist of plain toast, fruit, light soups, crackers)  3. Hydration: NPO: Nothing to eat and drink after midnight. (For TEE and Cath the same day) (List of approved liquids water,  clear juice, clear tea, black coffee, fruit juices, non-citric and without pulp, carbonated beverages, Gatorade, Kool -Aid, plain Jello-O and plain ice popsicles)  4. Labs: You will need to have blood drawn 30 days prior to procedure 5. Medication instructions in preparation for your procedure:   Contrast Allergy: No      Stop taking, Cozaar  (Losartan ) Tuesday, August 10,    Do not take Diabetes Med Glucophage  (Metformin ) on the day of the procedure and HOLD 48 HOURS AFTER THE PROCEDURE.  On the morning of your procedure, take your Aspirin  81 mg and any morning medicines NOT listed above.  You may use sips of water.  6. Plan to go home the same day, you will only stay overnight if medically necessary. 7. Bring a current list of your medications and current insurance cards. 8. You MUST have a responsible person to drive you home. 9. Someone MUST be with you the first 24 hours after you arrive home or your discharge will be delayed. 10. Please wear clothes that are easy to get on and off and wear slip-on shoes.  Thank you for allowing us  to care for you!   -- [facility]    Follow-Up: At [facility], you and your health needs are our priority.  As part of our continuing mission to provide you with exceptional heart care, we have created designated Provider Care Teams.  These Care Teams include your primary Cardiologist (physician) and Advanced  Practice Providers (APPs -  Physician Assistants and Nurse Practitioners) who all work together to provide you with the care you need, when you need it.  We recommend signing up for the patient portal called MyChart.  Sign up information is provided on this After Visit Summary.  MyChart is used to connect with patients for Virtual Visits (Telemedicine).  Patients are able to view lab/test results, encounter notes, upcoming appointments, etc.  Non-urgent messages can be sent to your provider as well.   To  learn more about what you can do with MyChart, go to [email or web address].    Your next appointment:   2 month(s)  The format for your next appointment:   In Person  Provider:   Lamar Fitch, MD   Other Instructions  Coronary Angiogram With Stent Coronary angiogram with stent placement is a procedure to widen or open a narrow blood vessel of the heart (coronary artery). Arteries may become blocked by cholesterol buildup (plaques) in the lining of the artery wall. When a coronary artery becomes partially blocked, blood flow to that area decreases. This may lead to chest pain or a heart attack (myocardial infarction). A stent is a small piece of metal that looks like mesh or spring. Stent placement may be done as treatment after a heart attack, or to prevent a heart attack if a blocked artery is found by a coronary angiogram. Let your health care provider know about: Any allergies you have, including allergies to medicines or contrast dye. All medicines you are taking, including vitamins, herbs, eye drops, creams, and over-the-counter medicines. Any problems you or family members have had with anesthetic medicines. Any blood disorders you have. Any surgeries you have had. Any medical conditions you have, including kidney problems or kidney failure. Whether you are pregnant or may be pregnant. Whether you are breastfeeding. What are the risks? Generally, this is a safe procedure. However, serious problems may occur, including: Damage to nearby structures or organs, such as the heart, blood vessels, or kidneys. A return of blockage. Bleeding, infection, or bruising at the insertion site. A collection of blood under the skin (hematoma) at the insertion site. A blood clot in another part of the body. Allergic reaction to medicines or dyes. Bleeding into the abdomen (retroperitoneal bleeding). Stroke (rare). Heart attack (rare). What happens before the procedure? Staying  hydrated Follow instructions from your health care provider about hydration, which may include: Up to 2 hours before the procedure - you may continue to drink clear liquids, such as water, clear fruit juice, black coffee, and plain tea.    Eating and drinking restrictions Follow instructions from your health care provider about eating and drinking, which may include: 8 hours before the procedure - stop eating heavy meals or foods, such as meat, fried foods, or fatty foods. 6 hours before the procedure - stop eating light meals or foods, such as toast or cereal. 2 hours before the procedure - stop drinking clear liquids. Medicines Ask your health care provider about: Changing or stopping your regular medicines. This is especially important if you are taking diabetes medicines or blood thinners. Taking medicines such as aspirin  and ibuprofen. These medicines can thin your blood. Do not take these medicines unless your health care provider tells you to take them. Generally, aspirin  is recommended before a thin tube, called a catheter, is passed through a blood vessel and inserted into the heart (cardiac catheterization). Taking over-the-counter medicines, vitamins, herbs, and supplements. General instructions Do not use any  products that contain nicotine or tobacco for at least 4 weeks before the procedure. These products include cigarettes, e-cigarettes, and chewing tobacco. If you need help quitting, ask your health care provider. Plan to have someone take you home from the hospital or clinic. If you will be going home right after the procedure, plan to have someone with you for 24 hours. You may have tests and imaging procedures. Ask your health care provider: How your insertion site will be marked. Ask which artery will be used for the procedure. What steps will be taken to help prevent infection. These may include: Removing hair at the insertion site. Washing skin with a germ-killing  soap. Taking antibiotic medicine. What happens during the procedure? An IV will be inserted into one of your veins. Electrodes may be placed on your chest to monitor your heart rate during the procedure. You will be given one or more of the following: A medicine to help you relax (sedative). A medicine to numb the area (local anesthetic) for catheter insertion. A small incision will be made for catheter insertion. The catheter will be inserted into an artery using a guide wire. The location may be in your groin, your wrist, or the fold of your arm (near your elbow). An X-ray procedure (fluoroscopy) will be used to help guide the catheter to the opening of the heart arteries. A dye will be injected into the catheter. X-rays will be taken. The dye helps to show where any narrowing or blockages are located in the arteries. Tell your health care provider if you have chest pain or trouble breathing. A tiny wire will be guided to the blocked spot, and a balloon will be inflated to make the artery wider. The stent will be expanded to crush the plaques into the wall of the vessel. The stent will hold the area open and improve the blood flow. Most stents have a drug coating to reduce the risk of the stent narrowing over time. The artery may be made wider using a drill, laser, or other tools that remove plaques. The catheter will be removed when the blood flow improves. The stent will stay where it was placed, and the lining of the artery will grow over it. A bandage (dressing) will be placed on the insertion site. Pressure will be applied to stop bleeding. The IV will be removed. This procedure may vary among health care providers and hospitals.    What happens after the procedure? Your blood pressure, heart rate, breathing rate, and blood oxygen level will be monitored until you leave the hospital or clinic. If the procedure is done through the leg, you will lie flat in bed for a few hours or for as  long as told by your health care provider. You will be instructed not to bend or cross your legs. The insertion site and the pulse in your foot or wrist will be checked often. You may have more blood tests, X-rays, and a test that records the electrical activity of your heart (electrocardiogram, or ECG). Do not drive for 24 hours if you were given a sedative during your procedure. Summary Coronary angiogram with stent placement is a procedure to widen or open a narrowed coronary artery. This is done to treat heart problems. Before the procedure, let your health care provider know about all the medical conditions and surgeries you have or have had. This is a safe procedure. However, some problems may occur, including damage to nearby structures or organs, bleeding, blood  clots, or allergies. Follow your health care provider's instructions about eating, drinking, medicines, and other lifestyle changes, such as quitting tobacco use before the procedure. This information is not intended to replace advice given to you by your health care provider. Make sure you discuss any questions you have with your health care provider. Document Revised: 04/03/2019 Document Reviewed: 04/03/2019 Elsevier Patient Education  2021 [employer].

## 2024-04-25 NOTE — Progress Notes (Unsigned)
 [facility] [address] [place] [address] Phone: [phone] Subjective:   LILLETTE Berwyn Posey, am serving as a scribe for Dr. Arthea Claudene.  I'm seeing this patient by the request  of:  Cristopher Bottcher, NP  CC: Low back pain  YEP:Dlagzrupcz  [facility] is a 63 y.o. male coming in with complaint of back and neck pain. OMT 02/26/2024.  Patient states that he is sore from working at [employer]. Using gabapentin  prn but had to take them all 4 days that he worked.   Medications patient has been prescribed: Gabapentin   Taking: Yes         Reviewed prior external information including notes and imaging from previsou exam, outside providers and external EMR if available.   As well as notes that were available from care everywhere and other healthcare systems.  Past medical history, social, surgical and family history all reviewed in electronic medical record.  No pertanent information unless stated regarding to the chief complaint.   Past Medical History:  Diagnosis Date   Acute lateral meniscal tear 06/09/2016   Arthritis    knees, lower back, hands (05/18/2016)   Atherosclerotic heart disease of native coronary artery without angina pectoris    05/18/16  Cath severe 90% LAD prox, 99% mid, Circ dominant with 30% OM1, 50% OM2, 30% OM3, 60% distal circ and PL, severe stenosis nondominant RCA.  EF 55%  3.0x 20, 2.5 x 20 mm, 2.25 x 16 mm Promus premier sent to [facility] Dr. Verlin    Bariatric surgery status 05/18/2016   Benign essential hypertension 01/28/2021   Bursitis of right shoulder 01/21/2019   CAD (coronary artery disease), native coronary artery    05/18/16  Cath severe 90% LAD prox, 99% mid, Circ dominant with 30% OM1, 50% OM2, 30% OM3, 60% distal circ and PL, severe stenosis nondominant RCA.  EF 55%  3.0x 20, 2.5 x 20 mm, 2.25 x 16 mm Promus premier sent to [facility] Dr. Verlin    Chronic bronchitis (HCC)    most years (05/18/2016)    Chronic lower back pain    Closed fracture of right distal radius 03/12/2020   Closed fracture of right proximal humerus 03/12/2020   DDD (degenerative disc disease), lumbar    Degenerative arthritis of right knee 05/09/2017   Degenerative disc disease, lumbar 07/07/2016   Degenerative lumbar spinal stenosis 04/07/2021   Diabetes mellitus without complication (HCC)    Dislocation of left middle finger 01/21/2019   Encounter for immunization 01/28/2021   Erectile dysfunction 01/28/2021   Gastro-esophageal reflux 01/28/2021   History of nutritional deficiency 10/26/2021   Hyperglycemia due to type 2 diabetes mellitus (HCC) 01/28/2021   Hyperlipidemia    Hypertensive heart disease without CHF    Lumbago with sciatica, right side 04/07/2021   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 type 2 diabetes mellitus (HCC) 01/28/2021   Mixed hyperlipidemia 05/18/2016   Nonallopathic lesion of lumbosacral region 07/07/2016   Nonallopathic lesion of sacral region 07/07/2016   Nonallopathic lesion of thoracic region 07/07/2016   Obesity    Radiculopathy, lumbar region 04/07/2021   Shoulder joint pain 01/28/2021   Type II diabetes mellitus (HCC)    Unstable angina (HCC)    Vitamin D  deficiency 01/28/2021    No Known Allergies   Review of Systems:  No headache, visual changes, nausea, vomiting, diarrhea, constipation, dizziness, abdominal pain, skin rash, fevers, chills, night sweats, weight loss, swollen lymph nodes,  body aches, joint swelling, chest pain, shortness of breath, mood changes. POSITIVE muscle aches  Objective  Blood pressure 110/78, pulse 91, height 5' 9 (1.753 m), weight 241 lb (109.3 kg), SpO2 97%.   General: No apparent distress alert and oriented x3 mood and affect normal, dressed appropriately.  HEENT: Pupils equal, extraocular movements intact  Respiratory: Patient's speak in full sentences and does not appear short of breath   Cardiovascular: No lower extremity edema, non tender, no erythema  Gait normal MSK:  Back does have some loss lordosis noted.  Some poor core strength still noted.  Some tightness noted in the paraspinal musculature.  Osteopathic findings  T3 extended rotated and side bent right inhaled rib T7 extended rotated and side bent left L2 flexed rotated and side bent right Sacrum right on right       Assessment and Plan:  Radiculopathy, lumbar region Discussed icing regimen and home exercises.  Discussed core strengthening.  I do think that this is an exacerbation of a chronic problem with worsening symptoms.  Toradol  and Depo-Medrol  given today.  Responding well to osteopathic manipulation.  Worsening pain we may need to consider the epidurals again.  Follow-up again in 6 to 8 weeks    Nonallopathic problems  Decision today to treat with OMT was based on Physical Exam  After verbal consent patient was treated with HVLA, ME, FPR techniques in thoracic, lumbar, and sacral  areas  Patient tolerated the procedure well with improvement in symptoms  Patient given exercises, stretches and lifestyle modifications  See medications in patient instructions if given  Patient will follow up in 4-8 weeks    The above documentation has been reviewed and is accurate and complete Danity Schmelzer M Delailah Spieth, DO          Note: This dictation was prepared with Dragon dictation along with smaller phrase technology. Any transcriptional errors that result from this process are unintentional.

## 2024-04-30 ENCOUNTER — Encounter: Payer: Self-pay | Admitting: Family Medicine

## 2024-04-30 ENCOUNTER — Ambulatory Visit: Admitting: Family Medicine

## 2024-04-30 VITALS — BP 110/78 | HR 91 | Ht 69.0 in | Wt 241.0 lb

## 2024-04-30 DIAGNOSIS — G5761 Lesion of plantar nerve, right lower limb: Secondary | ICD-10-CM

## 2024-04-30 DIAGNOSIS — M9902 Segmental and somatic dysfunction of thoracic region: Secondary | ICD-10-CM

## 2024-04-30 DIAGNOSIS — M5136 Other intervertebral disc degeneration, lumbar region with discogenic back pain only: Secondary | ICD-10-CM

## 2024-04-30 DIAGNOSIS — M9903 Segmental and somatic dysfunction of lumbar region: Secondary | ICD-10-CM

## 2024-04-30 DIAGNOSIS — M9904 Segmental and somatic dysfunction of sacral region: Secondary | ICD-10-CM

## 2024-04-30 DIAGNOSIS — M5416 Radiculopathy, lumbar region: Secondary | ICD-10-CM

## 2024-04-30 DIAGNOSIS — M79671 Pain in right foot: Secondary | ICD-10-CM | POA: Insufficient documentation

## 2024-04-30 MED ORDER — METHYLPREDNISOLONE ACETATE 80 MG/ML IJ SUSP
80.0000 mg | Freq: Once | INTRAMUSCULAR | Status: AC
  Administered 2024-04-30: 80 mg via INTRAMUSCULAR

## 2024-04-30 MED ORDER — KETOROLAC TROMETHAMINE 60 MG/2ML IM SOLN
60.0000 mg | Freq: Once | INTRAMUSCULAR | Status: AC
  Administered 2024-04-30: 60 mg via INTRAMUSCULAR

## 2024-04-30 NOTE — Patient Instructions (Signed)
 Dr. Elsa Georgia ask Wonda about Celebrex See me again in 8 weeks

## 2024-04-30 NOTE — Assessment & Plan Note (Signed)
 Postsurgical changes noted of the foot.  Patient does have what appears to be more of a rigid midfoot with a potential lipoma.  Discussed with patient about the possibility of referral to a Ortho surgeon to discuss further with patient having chronic pain.

## 2024-04-30 NOTE — Assessment & Plan Note (Signed)
 Discussed icing regimen and home exercises.  Discussed core strengthening.  I do think that this is an exacerbation of a chronic problem with worsening symptoms.  Toradol  and Depo-Medrol  given today.  Responding well to osteopathic manipulation.  Worsening pain we may need to consider the epidurals again.  Follow-up again in 6 to 8 weeks

## 2024-05-08 DIAGNOSIS — M19071 Primary osteoarthritis, right ankle and foot: Secondary | ICD-10-CM | POA: Diagnosis not present

## 2024-05-13 ENCOUNTER — Telehealth: Payer: Self-pay | Admitting: *Deleted

## 2024-05-13 DIAGNOSIS — R0683 Snoring: Secondary | ICD-10-CM | POA: Diagnosis not present

## 2024-05-13 NOTE — Telephone Encounter (Signed)
 error

## 2024-05-13 NOTE — Telephone Encounter (Signed)
 Cardiac Catheterization scheduled at [facility] for: Tuesday May 14, 2024 12 Noon Arrival time [facility] Main Entrance A at: 10 AM  Diet: -May have light meal until 6 AM. (6 hours before procedure time) Approved light meal consists of plain toast, fruit, light soups, crackers.  Hydration: -May drink clear liquids until leaving for hospital. Approved liquids: Water , clear tea, black coffee, fruit juices-non-citric and without pulp,Gatorade, plain Jello/popsicles.  Drink 16  oz. bottle of water  on the way to the hospital.   Medication instructions: -Hold:  Metformin -day of procedure and 48 hours post procedure  Farxiga -AM of procedure  Mounjaro-weekly on Tuesdays-has not taken in ~ 2 weeks -Other usual morning medications can be taken including aspirin  81 mg.  Plan to go home the same day, you will only stay overnight if medically necessary.  You must have responsible adult to drive you home.  Someone must be with you the first 24 hours after you arrive home.

## 2024-05-13 NOTE — Telephone Encounter (Signed)
 CMP/CBC-diff -reviewed date 04/15/24 -scanned under 05/07/24 Unknown Encounters tab -Brooke Hester-pt will also take copy of results with him to hospital.

## 2024-05-14 ENCOUNTER — Ambulatory Visit (HOSPITAL_COMMUNITY)
Admission: RE | Admit: 2024-05-14 | Discharge: 2024-05-14 | Disposition: A | Discharge status: Home / Self Care | Attending: Cardiovascular Disease | Admitting: Cardiovascular Disease

## 2024-05-14 ENCOUNTER — Encounter (HOSPITAL_COMMUNITY)
Admission: RE | Disposition: A | Discharge status: Home / Self Care | Payer: Self-pay | Source: Home / Self Care | Attending: Cardiovascular Disease

## 2024-05-14 ENCOUNTER — Other Ambulatory Visit: Payer: Self-pay

## 2024-05-14 DIAGNOSIS — Z7902 Long term (current) use of antithrombotics/antiplatelets: Secondary | ICD-10-CM | POA: Diagnosis not present

## 2024-05-14 DIAGNOSIS — Z7985 Long-term (current) use of injectable non-insulin antidiabetic drugs: Secondary | ICD-10-CM | POA: Insufficient documentation

## 2024-05-14 DIAGNOSIS — I350 Nonrheumatic aortic (valve) stenosis: Secondary | ICD-10-CM | POA: Diagnosis not present

## 2024-05-14 DIAGNOSIS — I2584 Coronary atherosclerosis due to calcified coronary lesion: Secondary | ICD-10-CM | POA: Insufficient documentation

## 2024-05-14 DIAGNOSIS — Z79899 Other long term (current) drug therapy: Secondary | ICD-10-CM | POA: Insufficient documentation

## 2024-05-14 DIAGNOSIS — Z87891 Personal history of nicotine dependence: Secondary | ICD-10-CM | POA: Diagnosis not present

## 2024-05-14 DIAGNOSIS — I2510 Atherosclerotic heart disease of native coronary artery without angina pectoris: Secondary | ICD-10-CM | POA: Diagnosis not present

## 2024-05-14 DIAGNOSIS — Z955 Presence of coronary angioplasty implant and graft: Secondary | ICD-10-CM | POA: Diagnosis not present

## 2024-05-14 DIAGNOSIS — E119 Type 2 diabetes mellitus without complications: Secondary | ICD-10-CM | POA: Insufficient documentation

## 2024-05-14 DIAGNOSIS — Z7984 Long term (current) use of oral hypoglycemic drugs: Secondary | ICD-10-CM | POA: Diagnosis not present

## 2024-05-14 DIAGNOSIS — E782 Mixed hyperlipidemia: Secondary | ICD-10-CM | POA: Diagnosis not present

## 2024-05-14 DIAGNOSIS — I119 Hypertensive heart disease without heart failure: Secondary | ICD-10-CM | POA: Insufficient documentation

## 2024-05-14 DIAGNOSIS — G4733 Obstructive sleep apnea (adult) (pediatric): Secondary | ICD-10-CM | POA: Diagnosis not present

## 2024-05-14 HISTORY — PX: RIGHT/LEFT HEART CATH AND CORONARY ANGIOGRAPHY: CATH118266

## 2024-05-14 LAB — POCT I-STAT 7, (LYTES, BLD GAS, ICA,H+H)
Acid-base deficit: 2 mmol/L (ref 0.0–2.0)
Bicarbonate: 23.7 mmol/L (ref 20.0–28.0)
Calcium, Ion: 1.25 mmol/L (ref 1.15–1.40)
HCT: 40 % (ref 39.0–52.0)
Hemoglobin: 13.6 g/dL (ref 13.0–17.0)
O2 Saturation: 99 %
Potassium: 4.2 mmol/L (ref 3.5–5.1)
Sodium: 138 mmol/L (ref 135–145)
TCO2: 25 mmol/L (ref 22–32)
pCO2 arterial: 42.7 mmHg (ref 32–48)
pH, Arterial: 7.352 (ref 7.35–7.45)
pO2, Arterial: 137 mmHg — ABNORMAL HIGH (ref 83–108)

## 2024-05-14 LAB — POCT I-STAT EG7
Acid-base deficit: 2 mmol/L (ref 0.0–2.0)
Bicarbonate: 24.3 mmol/L (ref 20.0–28.0)
Calcium, Ion: 1.26 mmol/L (ref 1.15–1.40)
HCT: 41 % (ref 39.0–52.0)
Hemoglobin: 13.9 g/dL (ref 13.0–17.0)
O2 Saturation: 77 %
Potassium: 4.3 mmol/L (ref 3.5–5.1)
Sodium: 137 mmol/L (ref 135–145)
TCO2: 26 mmol/L (ref 22–32)
pCO2, Ven: 46.6 mmHg (ref 44–60)
pH, Ven: 7.326 (ref 7.25–7.43)
pO2, Ven: 45 mmHg (ref 32–45)

## 2024-05-14 LAB — GLUCOSE, CAPILLARY
Glucose-Capillary: 105 mg/dL — ABNORMAL HIGH (ref 70–99)
Glucose-Capillary: 120 mg/dL — ABNORMAL HIGH (ref 70–99)

## 2024-05-14 SURGERY — RIGHT/LEFT HEART CATH AND CORONARY ANGIOGRAPHY
Anesthesia: LOCAL

## 2024-05-14 MED ORDER — SODIUM CHLORIDE 0.9 % IV SOLN: 250.0000 mL | INTRAVENOUS | Status: DC | PRN

## 2024-05-14 MED ORDER — FENTANYL CITRATE (PF) 100 MCG/2ML IJ SOLN
INTRAMUSCULAR | Status: AC
  Filled 2024-05-14: qty 2

## 2024-05-14 MED ORDER — SODIUM CHLORIDE 0.9% FLUSH
3.0000 mL | INTRAVENOUS | Status: DC | PRN
Start: 2024-05-14 — End: 2024-05-14

## 2024-05-14 MED ORDER — FREE WATER: 500.0000 mL | Freq: Once | Status: DC

## 2024-05-14 MED ORDER — FENTANYL CITRATE (PF) 100 MCG/2ML IJ SOLN
INTRAMUSCULAR | Status: DC | PRN
  Administered 2024-05-14: 25 ug via INTRAVENOUS

## 2024-05-14 MED ORDER — ACETAMINOPHEN 325 MG PO TABS: 650.0000 mg | ORAL_TABLET | ORAL | Status: DC | PRN

## 2024-05-14 MED ORDER — MIDAZOLAM HCL 2 MG/2ML IJ SOLN
INTRAMUSCULAR | Status: DC | PRN
  Administered 2024-05-14: 2 mg via INTRAVENOUS

## 2024-05-14 MED ORDER — HEPARIN (PORCINE) IN NACL 2-0.9 UNITS/ML
INTRAMUSCULAR | Status: DC | PRN
  Administered 2024-05-14: 10 mL via INTRA_ARTERIAL

## 2024-05-14 MED ORDER — HEPARIN SODIUM (PORCINE) 1000 UNIT/ML IJ SOLN
INTRAMUSCULAR | Status: AC
  Filled 2024-05-14: qty 10

## 2024-05-14 MED ORDER — HEPARIN SODIUM (PORCINE) 1000 UNIT/ML IJ SOLN
INTRAMUSCULAR | Status: DC | PRN
  Administered 2024-05-14: 5000 [IU] via INTRAVENOUS

## 2024-05-14 MED ORDER — ASPIRIN 81 MG PO CHEW: 81.0000 mg | CHEWABLE_TABLET | ORAL | Status: AC

## 2024-05-14 MED ORDER — HEPARIN (PORCINE) IN NACL 1000-0.9 UT/500ML-% IV SOLN
INTRAVENOUS | Status: DC | PRN
  Administered 2024-05-14: 1000 mL

## 2024-05-14 MED ORDER — LIDOCAINE HCL (PF) 1 % IJ SOLN
INTRAMUSCULAR | Status: AC
Start: 2024-05-14 — End: 2024-05-14
  Filled 2024-05-14: qty 30

## 2024-05-14 MED ORDER — IOHEXOL 350 MG/ML SOLN
INTRAVENOUS | Status: DC | PRN
  Administered 2024-05-14: 29 mL

## 2024-05-14 MED ORDER — LABETALOL HCL 5 MG/ML IV SOLN
10.0000 mg | INTRAVENOUS | Status: DC | PRN
Start: 2024-05-14 — End: 2024-05-14

## 2024-05-14 MED ORDER — SODIUM CHLORIDE 0.9% FLUSH: 3.0000 mL | Freq: Two times a day (BID) | INTRAVENOUS | Status: DC

## 2024-05-14 MED ORDER — HYDRALAZINE HCL 20 MG/ML IJ SOLN: 10.0000 mg | INTRAMUSCULAR | Status: DC | PRN

## 2024-05-14 MED ORDER — MIDAZOLAM HCL 2 MG/2ML IJ SOLN
INTRAMUSCULAR | Status: AC
  Filled 2024-05-14: qty 2

## 2024-05-14 MED ORDER — SODIUM CHLORIDE 0.9% FLUSH: 3.0000 mL | INTRAVENOUS | Status: DC | PRN

## 2024-05-14 MED ORDER — ONDANSETRON HCL 4 MG/2ML IJ SOLN: 4.0000 mg | Freq: Four times a day (QID) | INTRAMUSCULAR | Status: DC | PRN

## 2024-05-14 MED ORDER — LIDOCAINE HCL (PF) 1 % IJ SOLN
INTRAMUSCULAR | Status: DC | PRN
  Administered 2024-05-14 (×2): 2 mL via INTRADERMAL

## 2024-05-14 MED ORDER — VERAPAMIL HCL 2.5 MG/ML IV SOLN
INTRAVENOUS | Status: AC
  Filled 2024-05-14: qty 2

## 2024-05-14 SURGICAL SUPPLY — 12 items
CATH 5FR JL3.5 JR4 ANG PIG MP (CATHETERS) IMPLANT
CATH BALLN WEDGE 5F 110CM (CATHETERS) IMPLANT
DEVICE RAD COMP TR BAND LRG (VASCULAR PRODUCTS) IMPLANT
GLIDESHEATH SLEND SS 6F .021 (SHEATH) IMPLANT
GUIDEWIRE .025 260CM (WIRE) IMPLANT
GUIDEWIRE INQWIRE 1.5J.035X260 (WIRE) IMPLANT
KIT SINGLE USE MANIFOLD (KITS) IMPLANT
KIT SYRINGE INJ CVI SPIKEX1 (MISCELLANEOUS) IMPLANT
PACK CARDIAC CATHETERIZATION (CUSTOM PROCEDURE TRAY) ×2 IMPLANT
SET ATX-X65L (MISCELLANEOUS) IMPLANT
SHEATH GLIDE SLENDER 4/5FR (SHEATH) IMPLANT
WIRE EMERALD 3MM-J .025X260CM (WIRE) IMPLANT

## 2024-05-14 NOTE — Discharge Instructions (Signed)

## 2024-05-14 NOTE — Progress Notes (Signed)
 Discharge instructions reviewed with patient and wife at bedside. Denies questions or concerns at this time, verbalized understanding. TB band removed, gauze dressing put in place no S/S of complications noted. PT ambualted to the bathroom where he was able to void without difficulty. IV removed. PT was assisted off the unit via wheel chair to personal vehicle driven by his wife.

## 2024-05-14 NOTE — Interval H&P Note (Signed)
 History and Physical Interval Note:  05/14/2024 8:03 AM  [facility]  has presented today for surgery, with the diagnosis of aortic stenosis.  The various methods of treatment have been discussed with the patient and family. After consideration of risks, benefits and other options for treatment, the patient has consented to  Procedure(s): RIGHT/LEFT HEART CATH AND CORONARY ANGIOGRAPHY (N/A) as a surgical intervention.  The patient's history has been reviewed, patient examined, no change in status, stable for surgery.  I have reviewed the patient's chart and labs.  Questions were answered to the patient's satisfaction.     Ozell Fell

## 2024-05-15 ENCOUNTER — Encounter (HOSPITAL_COMMUNITY): Payer: Self-pay | Admitting: Cardiovascular Disease

## 2024-05-29 ENCOUNTER — Other Ambulatory Visit: Payer: Self-pay | Admitting: Family Medicine

## 2024-05-29 NOTE — Progress Notes (Unsigned)
 [address]       [place] [address]             [phone]        ODILON CASS [facility] Medical Record #[id number] Date of Birth: [birth year]  Referring: Cristopher Bottcher, NP Primary Care: Cristopher Bottcher, NP Primary Cardiologist:None  Chief Complaint:   No chief complaint on file.   History of Present Illness:     Tim Cook is a 63 y.o. male who presents for surgical evaluation of ***  Tim Cook is a 63 y.o. male y.o. male  past medical history significant for coronary artery disease. In 2017 he did have stent to proximal LAD as well as mid LAD for 90% stenosis, additional problem include essential hypertension, diabetes, dyslipidemia, obstructive sleep apnea. Recently she did have a stress test done which was negative also got echocardiogram which surprisingly showed aortic stenosis which appears to be moderate to severe calculated aortic valve area is less than 1 cm squire, 0.78, mean gradient was 25, dimensionless index was 0.26 and stroke-volume was diminished 21. Interestingly also on physical exam he have a murmur mid peaking and S2 is still present there was no correlation between degree of stenosis that we did see on echocardiogram and physical exam therefore we decided to repeat echocardiogram echocardiogram shows similar findings. Looks like he got moderate to severe aortic stenosis which is probably low-flow low gradient.  Just to understand this will be better he did have a calcium  score of the aortic valve done which is more than 2000, exactly 2268.  Interestingly he does not have much symptoms.   Past Medical and Surgical History: Previous Chest Surgery: *** Previous Chest Radiation: *** Diabetes Mellitus: ***.  HbA1C *** Creatinine:  Lab Results  Component Value Date   CREATININE 1.12 03/18/2020   CREATININE 1.00 03/16/2020   CREATININE 0.84 05/19/2016     Past Medical History:  Diagnosis Date   Acute lateral meniscal tear 06/09/2016    Arthritis    knees, lower back, hands (05/18/2016)   Atherosclerotic heart disease of native coronary artery without angina pectoris    05/18/16  Cath severe 90% LAD prox, 99% mid, Circ dominant with 30% OM1, 50% OM2, 30% OM3, 60% distal circ and PL, severe stenosis nondominant RCA.  EF 55%  3.0x 20, 2.5 x 20 mm, 2.25 x 16 mm Promus premier sent to LAD Dr. Verlin    Bariatric surgery status 05/18/2016   Benign essential hypertension 01/28/2021   Bursitis of right shoulder 01/21/2019   CAD (coronary artery disease), native coronary artery    05/18/16  Cath severe 90% LAD prox, 99% mid, Circ dominant with 30% OM1, 50% OM2, 30% OM3, 60% distal circ and PL, severe stenosis nondominant RCA.  EF 55%  3.0x 20, 2.5 x 20 mm, 2.25 x 16 mm Promus premier sent to LAD Dr. Verlin    Chronic bronchitis (HCC)    most years (05/18/2016)   Chronic lower back pain    Closed fracture of right distal radius 03/12/2020   Closed fracture of right proximal humerus 03/12/2020   DDD (degenerative disc disease), lumbar    Degenerative arthritis of right knee 05/09/2017   Degenerative disc disease, lumbar 07/07/2016   Degenerative lumbar spinal stenosis 04/07/2021   Diabetes mellitus without complication (HCC)    Dislocation of left middle finger 01/21/2019   Encounter for immunization 01/28/2021   Erectile dysfunction 01/28/2021   Gastro-esophageal reflux 01/28/2021  History of nutritional deficiency 10/26/2021   Hyperglycemia due to type 2 diabetes mellitus (HCC) 01/28/2021   Hyperlipidemia    Hypertensive heart disease without CHF    Lumbago with sciatica, right side 04/07/2021   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 type 2 diabetes mellitus (HCC) 01/28/2021   Mixed hyperlipidemia 05/18/2016   Nonallopathic lesion of lumbosacral region 07/07/2016   Nonallopathic lesion of sacral region 07/07/2016   Nonallopathic lesion of thoracic region 07/07/2016   Obesity     Radiculopathy, lumbar region 04/07/2021   Shoulder joint pain 01/28/2021   Type II diabetes mellitus (HCC)    Unstable angina (HCC)    Vitamin D  deficiency 01/28/2021    Past Surgical History:  Procedure Laterality Date   CARDIAC CATHETERIZATION N/A 05/18/2016   Procedure: Left Heart Cath and Coronary Angiography;  Surgeon: Lonni JONETTA Cash, MD;  Location: [facility] INVASIVE CV LAB;  Service: Cardiovascular;  Laterality: N/A;   CARDIAC CATHETERIZATION N/A 05/18/2016   Procedure: Coronary Stent Intervention;  Surgeon: Lonni JONETTA Cash, MD;  Location: [facility] INVASIVE CV LAB;  Service: Cardiovascular;  Laterality: N/A;   CARPAL TUNNEL RELEASE Right 03/17/2020   Procedure: CARPAL TUNNEL RELEASE;  Surgeon: Josefina Chew, MD;  Location: [facility] ORS;  Service: Orthopedics;  Laterality: Right;   CORONARY ANGIOPLASTY     FOOT FRACTURE SURGERY Right 1970s   LAPAROSCOPIC GASTRIC BANDING  07/2007   by Dr. Gladis   ORIF HUMERUS FRACTURE Right 03/17/2020   Procedure: OPEN REDUCTION INTERNAL FIXATION (ORIF) PROXIMAL HUMERUS FRACTURE AND DISTAL RADIUS FRACTURE;  Surgeon: Josefina Chew, MD;  Location: [facility] ORS;  Service: Orthopedics;  Laterality: Right;   PILONIDAL CYST EXCISION  ~ 1985   took 8 out all at once   RIGHT/LEFT HEART CATH AND CORONARY ANGIOGRAPHY N/A 05/14/2024   Procedure: RIGHT/LEFT HEART CATH AND CORONARY ANGIOGRAPHY;  Surgeon: Wonda Sharper, MD;  Location: [facility] INVASIVE CV LAB;  Service: Cardiovascular;  Laterality: N/A;   SQUAMOUS CELL CARCINOMA EXCISION     right upper arm, left shoulder    Social History:  Social History   Tobacco Use  Smoking Status Former   Current packs/day: 0.00   Average packs/day: 0.5 packs/day for 12.0 years (6.0 ttl pk-yrs)   Types: Cigarettes   Start date: 12/15/1979   Quit date: 12/15/1991   Years since quitting: 32.4  Smokeless Tobacco Former   Types: Snuff  Tobacco Comments   quit snuff in ~ 1981    Social History   Substance and Sexual  Activity  Alcohol Use Yes   Alcohol/week: 7.0 standard drinks of alcohol   Types: 7 Cans of beer per week   Comment: More social use     No Known Allergies  Medications: Asprin: *** Statin: *** Beta Blocker: *** Ace Inhibitor: *** Anti-Coagulation: ***  Current Outpatient Medications  Medication Sig Dispense Refill   aspirin  EC 81 MG tablet Take 81 mg by mouth daily.     atorvastatin  (LIPITOR) 40 MG tablet Take 40 mg by mouth daily.     FARXIGA  10 MG TABS tablet Take 10 mg by mouth daily.      gabapentin  (NEURONTIN ) 100 MG capsule TAKE 2 CAPSULES(200 MG) BY MOUTH AT BEDTIME 180 capsule 1   HYDROcodone -acetaminophen  (NORCO) 10-325 MG tablet Take 1 tablet by mouth every 6 (six) hours as needed for pain.     loratadine  (CLARITIN ) 10 MG tablet Take 10 mg by mouth daily.     losartan  (COZAAR ) 100 MG tablet Take 100 mg  by mouth daily.     metFORMIN  (GLUCOPHAGE ) 500 MG tablet Take 1,000 mg by mouth 2 (two) times daily with a meal.     mometasone (NASONEX) 50 MCG/ACT nasal spray Place 2 sprays into the nose daily as needed (Rhinitis).     Multiple Vitamin (MULTIVITAMIN) capsule Take 1 capsule by mouth daily.     naproxen sodium (ALEVE) 220 MG tablet Take 220 mg by mouth daily as needed (pain).     nitroGLYCERIN  (NITROSTAT ) 0.4 MG SL tablet PLACE 1 TABLET UNDER THE TONGUE EVERY 5 MINUTES AS NEEDED FOR CHEST PAIN. 25 tablet 10   tirzepatide (MOUNJARO) 12.5 MG/0.5ML Pen Inject 12.5 mg into the skin once a week.     tiZANidine  (ZANAFLEX ) 2 MG tablet TAKE 1 TABLET BY MOUTH EVERYDAY AT BEDTIME (Patient taking differently: Take 2 mg by mouth daily as needed for muscle spasms.) 30 tablet 5   No current facility-administered medications for this visit.    (Not in a hospital admission)   Family History  Problem Relation Age of Onset   Heart disease Mother    Heart disease Father    Stroke Father    Heart attack Father    Heart disease Sister    Diabetes Mellitus I Sister    Stroke  Brother      Review of Systems:   ROS    Physical Exam: There were no vitals taken for this visit. Physical Exam    Diagnostic Studies & Laboratory data: Cardiac Studies & Procedures   ______________________________________________________________________________________________ CARDIAC CATHETERIZATION  CARDIAC CATHETERIZATION 05/14/2024  Conclusion   Prox LAD lesion is 10% stenosed.   Dist Cx lesion is 80% stenosed.   Prox RCA-1 lesion is 80% stenosed.   Prox RCA-2 lesion is 90% stenosed.   Mid RCA lesion is 99% stenosed.   Ost 1st Mrg to 1st Mrg lesion is 30% stenosed.   1st Mrg lesion is 50% stenosed.   3rd Mrg lesion is 30% stenosed.   Ost 4th Mrg to 4th Mrg lesion is 70% stenosed.   Ost LPDA lesion is 70% stenosed.   Ost LAD to Prox LAD lesion is 40% stenosed.   Prox Cx to Mid Cx lesion is 60% stenosed.   Non-stenotic Mid LAD lesion was previously treated.   Non-stenotic Dist LAD lesion was previously treated.   There is severe aortic valve stenosis.  1.  Patent left main with no significant stenosis 2.  Patent LAD stents with mild residual proximal LAD stenosis and mild diffuse plaquing 3.  Large, dominant left circumflex with moderate proximal stenosis and severe distal vessel stenosis just before the PLA/PDA bifurcation 4.  Small, nondominant RCA with severe stenosis 5.  Hemodynamic data: RA mean 4 mmHg RV 32/10 mmHg PA 24/8 mean 16 mmHg Pulmonary capillary wedge pressure mean 8 mmHg LVEDP 15 mmHg Aortic valve mean gradient 39 mmHg, calculated aortic valve area 0.91 cm  Cardiac output 6.2 L/min, cardiac index 2.85  Recommendation: surgical consult for consideration AVR +/- CABG in this 63 yo diabetic patient with diffuse CAD and severe aortic stenosis.  Findings Coronary Findings Diagnostic  Dominance: Left  Left Anterior Descending There is mild diffuse disease throughout the vessel. Ost LAD to Prox LAD lesion is 40% stenosed. Prox LAD lesion is  10% stenosed. The lesion is calcified. The lesion was previously treated . Non-stenotic Mid LAD lesion was previously treated. The lesion is calcified. Non-stenotic Dist LAD lesion was previously treated. The lesion is discrete.  First Product manager  Vessel is small in size.  First Septal Branch Vessel is small in size.  Second Diagonal Branch Vessel is small in size.  Left Circumflex Vessel is large. Prox Cx to Mid Cx lesion is 60% stenosed. Dist Cx lesion is 80% stenosed.  First Obtuse Marginal Branch Vessel is moderate in size. Ost 1st Mrg to 1st Mrg lesion is 30% stenosed. 1st Mrg lesion is 50% stenosed.  Third Obtuse Marginal Branch Vessel is moderate in size. 3rd Mrg lesion is 30% stenosed. The lesion is discrete.  Fourth Obtuse Marginal Branch Vessel is small in size. Ost 4th Mrg to 4th Mrg lesion is 70% stenosed.  Left Posterior Descending Artery Vessel is small in size. Ost LPDA lesion is 70% stenosed.  Right Coronary Artery Vessel is small. Prox RCA-1 lesion is 80% stenosed. The lesion is discrete. Prox RCA-2 lesion is 90% stenosed. The lesion is discrete. Mid RCA lesion is 99% stenosed. The lesion is discrete.  Intervention  No interventions have been documented.   CARDIAC CATHETERIZATION  CARDIAC CATHETERIZATION 05/18/2016  Conclusion  Prox RCA-2 lesion, 80 %stenosed.  Prox RCA-1 lesion, 80 %stenosed.  Mid RCA lesion, 99 %stenosed.  Ost 1st Mrg to 1st Mrg lesion, 30 %stenosed.  1st Mrg lesion, 50 %stenosed.  3rd Mrg lesion, 30 %stenosed.  Ost LPDA lesion, 70 %stenosed.  Ost 4th Mrg to 4th Mrg lesion, 70 %stenosed.  Dist Cx lesion, 60 %stenosed.  A STENT PROMUS PREM MR 2.25X16 drug eluting stent was successfully placed.  Dist LAD lesion, 90 %stenosed.  Post intervention, there is a 0% residual stenosis.  A STENT PROMUS PREM MR 2.5X20 drug eluting stent was successfully placed.  Mid LAD lesion, 99 %stenosed.  Post intervention,  there is a 0% residual stenosis.  A STENT PROMUS PREM MR 3.0X20 drug eluting stent was successfully placed.  Prox LAD lesion, 90 %stenosed.  Post intervention, there is a 10% residual stenosis.  The left ventricular systolic function is normal.  LV end diastolic pressure is moderately elevated.  The left ventricular ejection fraction is 50-55% by visual estimate.  1. Triple vessel CAD 2. Severe tandem stenoses in the proximal, mid and distal LAD. The proximal lesion was calcified. 3. Moderate stenosis in the distal left Circumflex system leading into relatively small left sided posterolateral branches. 4. Severe stenoses in the small non-dominant RCA 5. Overall preserved LV systolic function with elevated filling pressure. 6. Successful PTCA/DES x 3 in the proximal, mid and distal LAD (non-overlapping stents).  Recommendations: Will treat with ASA/Brilinta  for one year. Will continue beta blocker and statin. Will give one dose of IV Lasix  later today given elevated filling pressures. Medical management of disease in the distal Circumflex system and small non-dominant RCA.  Findings Coronary Findings Diagnostic  Dominance: Left  Left Anterior Descending The lesion is calcified. The lesion is calcified. The lesion is discrete.  First Diagonal Branch Vessel is small in size.  First Septal Branch Vessel is small in size.  Second Diagonal Branch Vessel is small in size.  Left Circumflex Vessel is large.  First Obtuse Marginal Branch Vessel is moderate in size.  Third Obtuse Marginal Branch Vessel is moderate in size. The lesion is discrete.  Fourth Obtuse Marginal Branch Vessel is small in size.  Left Posterior Descending Artery Vessel is small in size.  Right Coronary Artery Vessel is small. The lesion is discrete. The lesion is discrete. The lesion is discrete.  Intervention  Prox LAD lesion Angioplasty Pre-stent angioplasty was performed using a  BALLOON [place] EMERGE MR [id number]. A STENT PROMUS PREM MR 3.0X20 drug eluting stent was successfully placed. Stent strut is well apposed. Post-stent angioplasty was performed using a BALLOON [place] EMERGE MR 3.25X15. The pre-interventional distal flow is normal (TIMI 3).  The post-interventional distal flow is normal (TIMI 3). The intervention was successful . No complications occurred at this lesion. There is a 10% residual stenosis post intervention.  Mid LAD lesion Angioplasty Lesion crossed with guidewire. Pre-stent angioplasty was performed using a BALLOON EMERGE MR 2.0X12. A STENT PROMUS PREM MR 2.5X20 drug eluting stent was successfully placed. Stent strut is well apposed. Post-stent angioplasty was performed using a BALLOON [place] EMERGE MR [id number]. The pre-interventional distal flow is normal (TIMI 3).  The post-interventional distal flow is normal (TIMI 3). The intervention was successful . No complications occurred at this lesion. There is a 0% residual stenosis post intervention.  Dist LAD lesion Angioplasty Lesion crossed with guidewire. Pre-stent angioplasty was performed using a BALLOON [place]ER[place] MR 2.0X12. A STENT PROMUS PREM MR 2.25X16 drug eluting stent was successfully placed. Stent strut is well apposed. Post-stent angioplasty was performed using a BALLOON [place] EUPHORA RX2.25X8. The pre-interventional distal flow is normal (TIMI 3).  The post-interventional distal flow is normal (TIMI 3). The intervention was successful . No complications occurred at this lesion. There is a 0% residual stenosis post intervention.   STRESS TESTS  MYOCARDIAL PERFUSION IMAGING 12/29/2023  Interpretation Summary   Findings are consistent with no ischemia and no infarction. The study is low risk.   No ST deviation was noted.   LV perfusion is normal. There is no evidence of ischemia. There is no evidence of infarction.   Left ventricular function is abnormal. Global function is mildly reduced. There were no regional  wall motion abnormalities. End diastolic cavity size is normal. End systolic cavity size is mildly enlarged.   Prior study available for comparison from 10/29/2020.   EF estimate may not be accurate as wall motion appears normal Suggest TTE correlation   ECHOCARDIOGRAM  ECHOCARDIOGRAM COMPLETE 03/12/2024  Narrative ECHOCARDIOGRAM REPORT    Patient Name:   Tim Cook Date of Exam: 03/12/2024 Medical Rec #:  [id number]       Height:       69.0 in Accession #:    [id number]      Weight:       243.0 lb Date of Birth:  01-10-1961       BSA:          2.244 m Patient Age:    62 years        BP:           124/70 mmHg Patient Gender: M               HR:           74 bpm. Exam Location:  [place]  Procedure: 2D Echo, Cardiac Doppler and Color Doppler (Both Spectral and Color Flow Doppler were utilized during procedure).  Indications:    I35.0 Nonrheumatic aortic (valve) stenosis  History:        Patient has prior history of Echocardiogram examinations, most recent 01/24/2024. CAD, Aortic Valve Disease, Signs/Symptoms:Murmur; Risk Factors:Hypertension, Diabetes, Dyslipidemia and Former Smoker.  Sonographer:    Alan Greenhouse RDMS, RVT, RDCS Referring Phys: [phone] LAMAR PARAS KRASOWSKI  IMPRESSIONS   1. Left ventricular ejection fraction, by estimation, is 60 to 65%. The left ventricle has normal function. The left ventricle has no regional wall motion  abnormalities. Left ventricular diastolic parameters are consistent with Grade I diastolic dysfunction (impaired relaxation). 2. Right ventricular systolic function is normal. The right ventricular size is normal. 3. The mitral valve is normal in structure. No evidence of mitral valve regurgitation. No evidence of mitral stenosis. 4. The aortic valve was not well visualized. Aortic valve regurgitation is not visualized. Mild to moderate aortic valve stenosis. 5. The inferior vena cava is normal in size with greater than 50% respiratory  variability, suggesting right atrial pressure of 3 mmHg.  Comparison(s): Echocardiogram done 01/24/24 showed an EF of 55% with moderate to severe AS and an AV Mean Grad of 26 mmHg.  FINDINGS Left Ventricle: Left ventricular ejection fraction, by estimation, is 60 to 65%. The left ventricle has normal function. The left ventricle has no regional wall motion abnormalities. The left ventricular internal cavity size was normal in size. There is no left ventricular hypertrophy. Left ventricular diastolic parameters are consistent with Grade I diastolic dysfunction (impaired relaxation).  Right Ventricle: The right ventricular size is normal. No increase in right ventricular wall thickness. Right ventricular systolic function is normal.  Left Atrium: Left atrial size was normal in size.  Right Atrium: Right atrial size was normal in size.  Pericardium: There is no evidence of pericardial effusion.  Mitral Valve: The mitral valve is normal in structure. No evidence of mitral valve regurgitation. No evidence of mitral valve stenosis.  Tricuspid Valve: The tricuspid valve is normal in structure. Tricuspid valve regurgitation is not demonstrated. No evidence of tricuspid stenosis.  Aortic Valve: The aortic valve was not well visualized. Aortic valve regurgitation is not visualized. Mild to moderate aortic stenosis is present. Aortic valve mean gradient measures 25.0 mmHg. Aortic valve peak gradient measures 41.9 mmHg. Aortic valve area, by VTI measures 0.81 cm.  Pulmonic Valve: The pulmonic valve was normal in structure. Pulmonic valve regurgitation is not visualized. No evidence of pulmonic stenosis.  Aorta: The aortic root is normal in size and structure.  Venous: The inferior vena cava is normal in size with greater than 50% respiratory variability, suggesting right atrial pressure of 3 mmHg.  IAS/Shunts: No atrial level shunt detected by color flow Doppler.   LEFT VENTRICLE PLAX 2D LVIDd:          4.30 cm     Diastology LVIDs:         2.50 cm     LV e' medial:    5.77 cm/s LV PW:         1.10 cm     LV E/e' medial:  10.4 LV IVS:        1.00 cm     LV e' lateral:   7.07 cm/s LVOT diam:     2.00 cm     LV E/e' lateral: 8.5 LV SV:         54 LV SV Index:   24 LVOT Area:     3.14 cm  LV Volumes (MOD) LV vol d, MOD A2C: 49.5 ml LV vol d, MOD A4C: 56.6 ml LV vol s, MOD A2C: 17.0 ml LV vol s, MOD A4C: 21.5 ml LV SV MOD A2C:     32.5 ml LV SV MOD A4C:     56.6 ml LV SV MOD BP:      33.5 ml  RIGHT VENTRICLE RV S prime:     8.05 cm/s TAPSE (M-mode): 1.9 cm  LEFT ATRIUM             Index  RIGHT ATRIUM           Index LA diam:        2.70 cm 1.20 cm/m   RA Area:     10.00 cm LA Vol (A2C):   33.3 ml 14.84 ml/m  RA Volume:   19.10 ml  8.51 ml/m LA Vol (A4C):   41.9 ml 18.67 ml/m LA Biplane Vol: 38.1 ml 16.98 ml/m AORTIC VALVE AV Area (Vmax):    0.70 cm AV Area (Vmean):   0.79 cm AV Area (VTI):     0.81 cm AV Vmax:           323.50 cm/s AV Vmean:          223.400 cm/s AV VTI:            0.673 m AV Peak Grad:      41.9 mmHg AV Mean Grad:      25.0 mmHg LVOT Vmax:         72.50 cm/s LVOT Vmean:        56.500 cm/s LVOT VTI:          0.173 m LVOT/AV VTI ratio: 0.26  AORTA Ao Root diam: 3.40 cm Ao Asc diam:  3.10 cm  MITRAL VALVE MV Area (PHT): 2.56 cm    SHUNTS MV Decel Time: 296 msec    Systemic VTI:  0.17 m MV E velocity: 60.00 cm/s  Systemic Diam: 2.00 cm MV A velocity: 78.00 cm/s MV E/A ratio:  0.77  Jennifer Crape MD Electronically signed by Jennifer Crape MD Signature Date/Time: 03/13/2024/8:53:47 AM    Final      CT SCANS  CT CARDIAC SCORING (SELF PAY ONLY) 04/12/2024  Addendum 04/14/2024 11:15 AM ADDENDUM REPORT: 04/14/2024 11:13  EXAM: OVER-READ INTERPRETATION  CT CHEST  The following report is an over-read performed by radiologist Dr. Andrea Gasman of [facility] on 04/14/2024. This over-read does not include  interpretation of cardiac or coronary anatomy or pathology. The coronary calcium  score interpretation by the cardiologist is attached.  COMPARISON:  None.  FINDINGS: Vascular: Aortic atherosclerosis. The included aorta is normal in caliber.  Mediastinum/nodes: No adenopathy or mass. Unremarkable esophagus.  Lungs: No focal airspace disease. Benign calcified granuloma in the left lower lobe. No pleural fluid. The included airways are patent.  Upper abdomen: No acute findings.  Gastric band in place.  Musculoskeletal: There are no acute or suspicious osseous abnormalities. Thoracic spondylosis.  IMPRESSION: Aortic Atherosclerosis (ICD10-I70.0).   Electronically Signed By: Andrea Gasman M.D. On: 04/14/2024 11:13  Narrative : CLINICAL DATA:  Cardiovascular Disease Risk stratification  EXAM:  Coronary Calcium  Score  TECHNIQUE:  A gated, non-contrast computed tomography scan of the heart was  performed using 3mm slice thickness. Axial images were analyzed on a  dedicated workstation. Calcium  scoring of the coronary arteries was  performed using the Agatston method.  FINDINGS:  Coronary Calcium  Score:  Left main: 0  Left anterior descending artery: 2692  Left circumflex artery: 1661  Right coronary artery: 278  Total: 4630  Pericardium: Normal.  Ascending Aorta: Normal caliber.  Pulmonary artery: Normal caliber  Non-cardiac: See separate report from [facility].  IMPRESSION:  Coronary calcium  score of 4630. This was 46 percentile for age-, race-,  and sex-matched controls.  Calcium  Score of aortic valve - 2268  RECOMMENDATIONS:  Coronary artery calcium  (CAC) score is a strong predictor of  incident coronary heart disease (CHD) and provides predictive  information beyond traditional risk factors. CAC scoring is  reasonable to use in the decision to withhold, postpone, or initiate  statin therapy in intermediate-risk or  selected borderline-risk  asymptomatic adults (age 29-75 years and LDL-C >=70 to <190 mg/dL)  who do not have diabetes or established atherosclerotic  cardiovascular disease (ASCVD).* In intermediate-risk (10-year ASCVD  risk >=7.5% to <20%) adults or selected borderline-risk (10-year  ASCVD risk >=5% to <7.5%) adults in whom a CAC score is measured for  the purpose of making a treatment decision the following  recommendations have been made:  If CAC=0, it is reasonable to withhold statin therapy and reassess  in 5 to 10 years, as long as higher risk conditions are absent  (diabetes mellitus, family history of premature CHD in first degree  relatives (males <55 years; females <65 years), cigarette smoking,  or LDL >=190 mg/dL).  If CAC is 1 to 99, it is reasonable to initiate statin therapy for  patients >=44 years of age.  If CAC is >=100 or >=75th percentile, it is reasonable to initiate  statin therapy at any age.  Cardiology referral should be considered for patients with CAC  scores >=400 or >=75th percentile.  *2018 AHA/ACC/AACVPR/AAPA/ABC/ACPM/ADA/AGS/APhA/ASPC/NLA/PCNA  Guideline on the Management of Blood Cholesterol: A Report of the  American College of Cardiology/American Heart Association Task Force  on Clinical Practice Guidelines. J Am Coll Cardiol.  2019;73(24):3168-3209.  Electronically Signed: By: Lamar Fitch M.D. On: 04/12/2024 17:11     ______________________________________________________________________________________________     EKG: *** I have independently reviewed the above radiologic studies and discussed with the patient   Recent Lab Findings: Lab Results  Component Value Date   WBC 11.4 (H) 03/18/2020   HGB 13.6 05/14/2024   HCT 40.0 05/14/2024   PLT 342 03/18/2020   GLUCOSE 135 (H) 03/18/2020   CHOL 125 12/07/2018   TRIG 103 12/07/2018   HDL 57 12/07/2018   LDLCALC 47 12/07/2018   ALT 22 12/07/2018   AST 20  12/07/2018   NA 138 05/14/2024   K 4.2 05/14/2024   CL 98 03/18/2020   CREATININE 1.12 03/18/2020   BUN 24 (H) 03/18/2020   CO2 24 03/18/2020   HGBA1C 6.9 (H) 03/16/2020     Intervention  Assessment / Plan:   63 y.o. male with *** Mild to moderate AS.  No significant LAD disease.  Will need radial for one of the grafts.  ?AVR CABG LPDA, OM2  I  spent {CHL ONC TIME VISIT - [id number]} counseling the patient face to face.   Linnie [name] 05/29/2024 10:04 AM

## 2024-05-31 ENCOUNTER — Other Ambulatory Visit: Payer: Self-pay | Admitting: Thoracic Surgery (Cardiothoracic Vascular Surgery)

## 2024-05-31 ENCOUNTER — Other Ambulatory Visit: Payer: Self-pay | Admitting: *Deleted

## 2024-05-31 ENCOUNTER — Ambulatory Visit: Payer: Self-pay | Admitting: Emergency Medicine

## 2024-05-31 ENCOUNTER — Ambulatory Visit
Attending: Thoracic Surgery (Cardiothoracic Vascular Surgery) | Admitting: Thoracic Surgery (Cardiothoracic Vascular Surgery)

## 2024-05-31 VITALS — BP 116/78 | HR 92 | Resp 18 | Ht 69.0 in | Wt 227.0 lb

## 2024-05-31 DIAGNOSIS — I350 Nonrheumatic aortic (valve) stenosis: Secondary | ICD-10-CM | POA: Diagnosis not present

## 2024-05-31 DIAGNOSIS — I2510 Atherosclerotic heart disease of native coronary artery without angina pectoris: Secondary | ICD-10-CM

## 2024-05-31 NOTE — Heart Team MDD (Signed)
   Heart Team Multi-Disciplinary Discussion  Patient: Tim Cook  DOB: Jan 29, 1961  MRN: [id number]   Date: 05/31/2024  8:53 AM    Attendees: Interventional Cardiology: Deatrice Cage, MD Debby Como, MD Lurena Red, MD Gordy Bergamo, MD Peter [place], MD Lonni Hanson, MD Cara Lovelace, MD Ozell Fell, MD  Cardiothoracic Surgery: Linnie Rayas, MD   Additional Attendees: Daniel Benders MD   Patient History: 63 y.o. male  past medical history significant for CAD. In 2017 he did have stent to proximal LAD as well as mid LAD for 90% stenosis. Recent stress test done which was negative also got echocardiogram which showed aortic stenosis which appears to be moderate to severe calculated aortic valve area is less than 1 cm squire, 0.78, mean gradient was 25, dimensionless index was 0.26 and stroke-volume was diminished. Calcium  score of the aortic valve which is more than 2000. Pt reports progressive exertional dyspnea.      Risk Factors: Hypertension Hyperlipidemia Diabetes Mellitus Additional Risk Factors: OSA     Review of Prior Angiography and PCI Procedures: Right and left heart cath and coronary angiography images completed on 05/14/2024 were reviewed and discussed in detail including: Patent left main with no significant stenosis, patent LAD stents with mild residual proximal LAD stenosis and mild diffuse plaquing. Large, dominant left circumflex with moderate proximal stenosis and severe distal vessel stenosis just before the PLA/PDA bifurcation. Small, nondominant RCA with severe stenosis.     Discussion: After presentation, consideration of treatment options occurred. Given patient age and after thorough discussion, it was felt that more information and testing would be needed to make a more informed and accurate recommendation. Consensus from the team was for an MRI to be completed. Regarding severe aortic stenosis noted, additional discussion surrounded  mechanical valve versus bioprosthetic valves and TAVR versus surgical approach. Further conversation surrounded recent research indicating mechanical valves offer superior long-term survival for aortic valve replacement in patients 60 and younger.     Additional Recommendations/Special Instructions: MRI   [name], RN  05/31/2024 8:53 AM

## 2024-06-04 DIAGNOSIS — Z23 Encounter for immunization: Secondary | ICD-10-CM | POA: Diagnosis not present

## 2024-06-07 ENCOUNTER — Encounter (HOSPITAL_COMMUNITY): Payer: Self-pay

## 2024-06-11 ENCOUNTER — Other Ambulatory Visit: Payer: Self-pay | Admitting: Thoracic Surgery (Cardiothoracic Vascular Surgery)

## 2024-06-11 ENCOUNTER — Ambulatory Visit (HOSPITAL_COMMUNITY)
Admission: RE | Admit: 2024-06-11 | Discharge: 2024-06-11 | Disposition: A | Discharge status: Home / Self Care | Source: Ambulatory Visit | Attending: Thoracic Surgery (Cardiothoracic Vascular Surgery) | Admitting: Thoracic Surgery (Cardiothoracic Vascular Surgery)

## 2024-06-11 ENCOUNTER — Encounter: Payer: Self-pay | Admitting: Thoracic Surgery (Cardiothoracic Vascular Surgery)

## 2024-06-11 DIAGNOSIS — I350 Nonrheumatic aortic (valve) stenosis: Secondary | ICD-10-CM

## 2024-06-11 MED ORDER — GADOBUTROL 1 MMOL/ML IV SOLN
10.0000 mL | Freq: Once | INTRAVENOUS | Status: AC | PRN
  Administered 2024-06-11: 10 mL via INTRAVENOUS

## 2024-06-14 ENCOUNTER — Ambulatory Visit
Attending: Thoracic Surgery (Cardiothoracic Vascular Surgery) | Admitting: Thoracic Surgery (Cardiothoracic Vascular Surgery)

## 2024-06-14 DIAGNOSIS — I350 Nonrheumatic aortic (valve) stenosis: Secondary | ICD-10-CM | POA: Diagnosis not present

## 2024-06-14 NOTE — H&P (View-Only) (Signed)
     [address]       [place] [address]             [phone]       Patient: Home Provider: Office Consent for Telemedicine visit obtained.  Today's visit was completed via a real-time telehealth (see specific modality noted below). The patient/authorized person provided oral consent at the time of the visit to engage in a telemedicine encounter with the present provider at [facility]. The patient/authorized person was informed of the potential benefits, limitations, and risks of telemedicine. The patient/authorized person expressed understanding that the laws that protect confidentiality also apply to telemedicine. The patient/authorized person acknowledged understanding that telemedicine does not provide emergency services and that he or she would need to call 911 or proceed to the nearest hospital for help if such a need arose.   Total time spent in the clinical discussion 10 minutes.  Telehealth Modality: Phone visit (audio only)  I had a telephone visit with Tim Cook I discussed the results of the MRI with Tim Cook.  Of concern is his RVEF of 36%.  I reviewed his previous echo and RHC, and discussed these findings with the cardiology.  We will plan to pre-admit him on 10/6 for a repeat echo.  If the RV function still appears down, he will then undergo a right heart cath, and potentially be worked up for TAVR.  Tim Cook

## 2024-06-14 NOTE — Progress Notes (Signed)
     [address]       [place] [address]             [phone]       Patient: Home Provider: Office Consent for Telemedicine visit obtained.  Today's visit was completed via a real-time telehealth (see specific modality noted below). The patient/authorized person provided oral consent at the time of the visit to engage in a telemedicine encounter with the present provider at [facility]. The patient/authorized person was informed of the potential benefits, limitations, and risks of telemedicine. The patient/authorized person expressed understanding that the laws that protect confidentiality also apply to telemedicine. The patient/authorized person acknowledged understanding that telemedicine does not provide emergency services and that he or she would need to call 911 or proceed to the nearest hospital for help if such a need arose.   Total time spent in the clinical discussion 10 minutes.  Telehealth Modality: Phone visit (audio only)  I had a telephone visit with Tim Cook I discussed the results of the MRI with [name].  Of concern is his RVEF of 36%.  I reviewed his previous echo and RHC, and discussed these findings with the cardiology.  We will plan to pre-admit him on 10/6 for a repeat echo.  If the RV function still appears down, he will then undergo a right heart cath, and potentially be worked up for TAVR.  [name] [name]

## 2024-06-17 ENCOUNTER — Encounter: Payer: Self-pay | Admitting: Cardiology

## 2024-06-21 ENCOUNTER — Telehealth: Admitting: Thoracic Surgery (Cardiothoracic Vascular Surgery)

## 2024-06-26 ENCOUNTER — Ambulatory Visit: Admitting: Family Medicine

## 2024-06-27 ENCOUNTER — Ambulatory Visit: Admitting: Cardiology

## 2024-07-01 ENCOUNTER — Encounter (HOSPITAL_COMMUNITY): Payer: Self-pay | Admitting: Cardiology

## 2024-07-01 ENCOUNTER — Other Ambulatory Visit: Payer: Self-pay

## 2024-07-01 ENCOUNTER — Encounter (HOSPITAL_COMMUNITY)
Admission: RE | Disposition: A | Discharge status: Home / Self Care | Payer: Self-pay | Source: Home / Self Care | Attending: Thoracic Surgery (Cardiothoracic Vascular Surgery)

## 2024-07-01 ENCOUNTER — Inpatient Hospital Stay (HOSPITAL_COMMUNITY)
Admission: RE | Admit: 2024-07-01 | Source: Ambulatory Visit | Admitting: Thoracic Surgery (Cardiothoracic Vascular Surgery)

## 2024-07-01 ENCOUNTER — Inpatient Hospital Stay (HOSPITAL_COMMUNITY)
Admission: RE | Admit: 2024-07-01 | Discharge: 2024-07-07 | DRG: 220 | Disposition: A | Discharge status: Home / Self Care | Attending: Thoracic Surgery (Cardiothoracic Vascular Surgery) | Admitting: Thoracic Surgery (Cardiothoracic Vascular Surgery)

## 2024-07-01 ENCOUNTER — Inpatient Hospital Stay (HOSPITAL_COMMUNITY)

## 2024-07-01 DIAGNOSIS — Z79899 Other long term (current) drug therapy: Secondary | ICD-10-CM

## 2024-07-01 DIAGNOSIS — I2510 Atherosclerotic heart disease of native coronary artery without angina pectoris: Secondary | ICD-10-CM | POA: Diagnosis not present

## 2024-07-01 DIAGNOSIS — I5030 Unspecified diastolic (congestive) heart failure: Secondary | ICD-10-CM | POA: Diagnosis not present

## 2024-07-01 DIAGNOSIS — Z48812 Encounter for surgical aftercare following surgery on the circulatory system: Secondary | ICD-10-CM | POA: Diagnosis not present

## 2024-07-01 DIAGNOSIS — M545 Low back pain, unspecified: Secondary | ICD-10-CM | POA: Diagnosis not present

## 2024-07-01 DIAGNOSIS — Z833 Family history of diabetes mellitus: Secondary | ICD-10-CM | POA: Diagnosis not present

## 2024-07-01 DIAGNOSIS — D62 Acute posthemorrhagic anemia: Secondary | ICD-10-CM | POA: Diagnosis not present

## 2024-07-01 DIAGNOSIS — Z951 Presence of aortocoronary bypass graft: Secondary | ICD-10-CM | POA: Diagnosis not present

## 2024-07-01 DIAGNOSIS — Z8249 Family history of ischemic heart disease and other diseases of the circulatory system: Secondary | ICD-10-CM

## 2024-07-01 DIAGNOSIS — Z7982 Long term (current) use of aspirin: Secondary | ICD-10-CM

## 2024-07-01 DIAGNOSIS — J9811 Atelectasis: Secondary | ICD-10-CM | POA: Diagnosis not present

## 2024-07-01 DIAGNOSIS — J42 Unspecified chronic bronchitis: Secondary | ICD-10-CM | POA: Diagnosis present

## 2024-07-01 DIAGNOSIS — I110 Hypertensive heart disease with heart failure: Secondary | ICD-10-CM | POA: Diagnosis not present

## 2024-07-01 DIAGNOSIS — D696 Thrombocytopenia, unspecified: Secondary | ICD-10-CM | POA: Diagnosis not present

## 2024-07-01 DIAGNOSIS — E785 Hyperlipidemia, unspecified: Secondary | ICD-10-CM | POA: Diagnosis not present

## 2024-07-01 DIAGNOSIS — K219 Gastro-esophageal reflux disease without esophagitis: Secondary | ICD-10-CM | POA: Diagnosis not present

## 2024-07-01 DIAGNOSIS — R739 Hyperglycemia, unspecified: Secondary | ICD-10-CM | POA: Diagnosis not present

## 2024-07-01 DIAGNOSIS — I5032 Chronic diastolic (congestive) heart failure: Secondary | ICD-10-CM | POA: Diagnosis not present

## 2024-07-01 DIAGNOSIS — Z9884 Bariatric surgery status: Secondary | ICD-10-CM

## 2024-07-01 DIAGNOSIS — Z7984 Long term (current) use of oral hypoglycemic drugs: Secondary | ICD-10-CM

## 2024-07-01 DIAGNOSIS — Z955 Presence of coronary angioplasty implant and graft: Secondary | ICD-10-CM

## 2024-07-01 DIAGNOSIS — E669 Obesity, unspecified: Secondary | ICD-10-CM | POA: Diagnosis present

## 2024-07-01 DIAGNOSIS — G4733 Obstructive sleep apnea (adult) (pediatric): Secondary | ICD-10-CM | POA: Diagnosis not present

## 2024-07-01 DIAGNOSIS — Z87891 Personal history of nicotine dependence: Secondary | ICD-10-CM

## 2024-07-01 DIAGNOSIS — Z6836 Body mass index (BMI) 36.0-36.9, adult: Secondary | ICD-10-CM

## 2024-07-01 DIAGNOSIS — Z452 Encounter for adjustment and management of vascular access device: Secondary | ICD-10-CM | POA: Diagnosis not present

## 2024-07-01 DIAGNOSIS — I10 Essential (primary) hypertension: Secondary | ICD-10-CM | POA: Diagnosis not present

## 2024-07-01 DIAGNOSIS — I471 Supraventricular tachycardia, unspecified: Secondary | ICD-10-CM | POA: Diagnosis not present

## 2024-07-01 DIAGNOSIS — Z4682 Encounter for fitting and adjustment of non-vascular catheter: Secondary | ICD-10-CM | POA: Diagnosis not present

## 2024-07-01 DIAGNOSIS — Z952 Presence of prosthetic heart valve: Secondary | ICD-10-CM

## 2024-07-01 DIAGNOSIS — Z85828 Personal history of other malignant neoplasm of skin: Secondary | ICD-10-CM

## 2024-07-01 DIAGNOSIS — G8929 Other chronic pain: Secondary | ICD-10-CM | POA: Diagnosis present

## 2024-07-01 DIAGNOSIS — Z01818 Encounter for other preprocedural examination: Secondary | ICD-10-CM | POA: Diagnosis not present

## 2024-07-01 DIAGNOSIS — E782 Mixed hyperlipidemia: Secondary | ICD-10-CM | POA: Diagnosis present

## 2024-07-01 DIAGNOSIS — I350 Nonrheumatic aortic (valve) stenosis: Principal | ICD-10-CM | POA: Diagnosis present

## 2024-07-01 DIAGNOSIS — E1165 Type 2 diabetes mellitus with hyperglycemia: Secondary | ICD-10-CM | POA: Diagnosis not present

## 2024-07-01 DIAGNOSIS — Z9889 Other specified postprocedural states: Secondary | ICD-10-CM | POA: Diagnosis not present

## 2024-07-01 DIAGNOSIS — Z7985 Long-term (current) use of injectable non-insulin antidiabetic drugs: Secondary | ICD-10-CM

## 2024-07-01 DIAGNOSIS — Z860100 Personal history of colon polyps, unspecified: Secondary | ICD-10-CM

## 2024-07-01 DIAGNOSIS — M1711 Unilateral primary osteoarthritis, right knee: Secondary | ICD-10-CM | POA: Diagnosis present

## 2024-07-01 DIAGNOSIS — E871 Hypo-osmolality and hyponatremia: Secondary | ICD-10-CM | POA: Diagnosis present

## 2024-07-01 DIAGNOSIS — J982 Interstitial emphysema: Secondary | ICD-10-CM | POA: Diagnosis not present

## 2024-07-01 DIAGNOSIS — I38 Endocarditis, valve unspecified: Secondary | ICD-10-CM | POA: Diagnosis not present

## 2024-07-01 HISTORY — PX: RIGHT HEART CATH: CATH118263

## 2024-07-01 LAB — POCT I-STAT EG7
Acid-Base Excess: 0 mmol/L (ref 0.0–2.0)
Acid-Base Excess: 1 mmol/L (ref 0.0–2.0)
Bicarbonate: 25.9 mmol/L (ref 20.0–28.0)
Bicarbonate: 26.9 mmol/L (ref 20.0–28.0)
Calcium, Ion: 1.25 mmol/L (ref 1.15–1.40)
Calcium, Ion: 1.26 mmol/L (ref 1.15–1.40)
HCT: 43 % (ref 39.0–52.0)
HCT: 44 % (ref 39.0–52.0)
Hemoglobin: 14.6 g/dL (ref 13.0–17.0)
Hemoglobin: 15 g/dL (ref 13.0–17.0)
O2 Saturation: 73 %
O2 Saturation: 74 %
Potassium: 4.2 mmol/L (ref 3.5–5.1)
Potassium: 4.2 mmol/L (ref 3.5–5.1)
Sodium: 138 mmol/L (ref 135–145)
Sodium: 139 mmol/L (ref 135–145)
TCO2: 27 mmol/L (ref 22–32)
TCO2: 28 mmol/L (ref 22–32)
pCO2, Ven: 43.7 mmHg — ABNORMAL LOW (ref 44–60)
pCO2, Ven: 45.5 mmHg (ref 44–60)
pH, Ven: 7.381 (ref 7.25–7.43)
pH, Ven: 7.381 (ref 7.25–7.43)
pO2, Ven: 39 mmHg (ref 32–45)
pO2, Ven: 40 mmHg (ref 32–45)

## 2024-07-01 LAB — COMPREHENSIVE METABOLIC PANEL WITH GFR
ALT: 26 U/L (ref 0–44)
AST: 23 U/L (ref 15–41)
Albumin: 3.6 g/dL (ref 3.5–5.0)
Alkaline Phosphatase: 69 U/L (ref 38–126)
Anion gap: 10 (ref 5–15)
BUN: 15 mg/dL (ref 8–23)
CO2: 25 mmol/L (ref 22–32)
Calcium: 8.9 mg/dL (ref 8.9–10.3)
Chloride: 101 mmol/L (ref 98–111)
Creatinine, Ser: 1.4 mg/dL — ABNORMAL HIGH (ref 0.61–1.24)
GFR, Estimated: 57 mL/min — ABNORMAL LOW (ref >60)
Glucose, Bld: 177 mg/dL — ABNORMAL HIGH (ref 70–99)
Potassium: 4.5 mmol/L (ref 3.5–5.1)
Sodium: 136 mmol/L (ref 135–145)
Total Bilirubin: 0.7 mg/dL (ref 0.0–1.2)
Total Protein: 6.2 g/dL — ABNORMAL LOW (ref 6.5–8.1)

## 2024-07-01 LAB — HEMOGLOBIN A1C
Hgb A1c MFr Bld: 5.4 % (ref 4.8–5.6)
Mean Plasma Glucose: 108.28 mg/dL

## 2024-07-01 LAB — BASIC METABOLIC PANEL WITH GFR
Anion gap: 11 (ref 5–15)
Anion gap: 9 (ref 5–15)
BUN: 14 mg/dL (ref 8–23)
BUN: 12 mg/dL (ref 8–23)
CO2: 25 mmol/L (ref 22–32)
CO2: 27 mmol/L (ref 22–32)
Calcium: 9.3 mg/dL (ref 8.9–10.3)
Calcium: 9.1 mg/dL (ref 8.9–10.3)
Chloride: 101 mmol/L (ref 98–111)
Chloride: 101 mmol/L (ref 98–111)
Creatinine, Ser: 1.13 mg/dL (ref 0.61–1.24)
Creatinine, Ser: 1.18 mg/dL (ref 0.61–1.24)
GFR, Estimated: >60 mL/min (ref >60)
GFR, Estimated: >60 mL/min (ref >60)
Glucose, Bld: 97 mg/dL (ref 70–99)
Glucose, Bld: 150 mg/dL — ABNORMAL HIGH (ref 70–99)
Potassium: 4.7 mmol/L (ref 3.5–5.1)
Potassium: 4.9 mmol/L (ref 3.5–5.1)
Sodium: 137 mmol/L (ref 135–145)
Sodium: 137 mmol/L (ref 135–145)

## 2024-07-01 LAB — CBC
HCT: 44.7 % (ref 39.0–52.0)
Hemoglobin: 15.3 g/dL (ref 13.0–17.0)
MCH: 32.2 pg (ref 26.0–34.0)
MCHC: 34.2 g/dL (ref 30.0–36.0)
MCV: 94.1 fL (ref 80.0–100.0)
Platelets: 261 K/uL (ref 150–400)
RBC: 4.75 MIL/uL (ref 4.22–5.81)
RDW: 11.9 % (ref 11.5–15.5)
WBC: 7.7 K/uL (ref 4.0–10.5)
nRBC: 0 % (ref 0.0–0.2)

## 2024-07-01 LAB — ECHOCARDIOGRAM COMPLETE
AR max vel: 0.64 cm2
AV Area VTI: 0.82 cm2
AV Area mean vel: 0.71 cm2
AV Mean grad: 27 mmHg
AV Peak grad: 42.8 mmHg
Ao pk vel: 3.27 m/s
Area-P 1/2: 3.53 cm2
Height: 69 in
S' Lateral: 3.5 cm
Weight: 3616 [oz_av]

## 2024-07-01 LAB — URINALYSIS, ROUTINE W REFLEX MICROSCOPIC
Bacteria, UA: NONE SEEN
Bilirubin Urine: NEGATIVE
Glucose, UA: >=500 mg/dL — AB
Hgb urine dipstick: NEGATIVE
Ketones, ur: NEGATIVE mg/dL
Leukocytes,Ua: NEGATIVE
Nitrite: NEGATIVE
Protein, ur: NEGATIVE mg/dL
Specific Gravity, Urine: 1.006 (ref 1.005–1.030)
pH: 6 (ref 5.0–8.0)

## 2024-07-01 LAB — GLUCOSE, CAPILLARY: Glucose-Capillary: 112 mg/dL — ABNORMAL HIGH (ref 70–99)

## 2024-07-01 LAB — PREPARE RBC (CROSSMATCH)

## 2024-07-01 LAB — SURGICAL PCR SCREEN
MRSA, PCR: NEGATIVE
Staphylococcus aureus: NEGATIVE

## 2024-07-01 LAB — ABO/RH: ABO/RH(D): O POS

## 2024-07-01 LAB — BRAIN NATRIURETIC PEPTIDE: B Natriuretic Peptide: 19.4 pg/mL (ref 0.0–100.0)

## 2024-07-01 LAB — PROTIME-INR
INR: 1 (ref 0.8–1.2)
Prothrombin Time: 13.7 s (ref 11.4–15.2)

## 2024-07-01 LAB — APTT: aPTT: 30 s (ref 24–36)

## 2024-07-01 SURGERY — RIGHT HEART CATH
Anesthesia: LOCAL

## 2024-07-01 MED ORDER — NITROGLYCERIN IN D5W 200-5 MCG/ML-% IV SOLN
2.0000 ug/min | INTRAVENOUS | Status: DC
Start: 2024-07-02 — End: 2024-07-03
  Filled 2024-07-01: qty 250

## 2024-07-01 MED ORDER — LIDOCAINE HCL (PF) 1 % IJ SOLN
INTRAMUSCULAR | Status: AC
  Filled 2024-07-01: qty 30

## 2024-07-01 MED ORDER — VANCOMYCIN HCL 1250 MG/250ML IV SOLN: 1250.0000 mg | INTRAVENOUS | Status: DC

## 2024-07-01 MED ORDER — TRANEXAMIC ACID 1000 MG/10ML IV SOLN
1.5000 mg/kg/h | INTRAVENOUS | Status: AC
  Administered 2024-07-02: 1.5 mg/kg/h via INTRAVENOUS
  Filled 2024-07-01: qty 25

## 2024-07-01 MED ORDER — BISACODYL 5 MG PO TBEC
5.0000 mg | DELAYED_RELEASE_TABLET | Freq: Once | ORAL | Status: AC
  Administered 2024-07-01: 5 mg via ORAL
  Filled 2024-07-01: qty 1

## 2024-07-01 MED ORDER — CEFAZOLIN SODIUM-DEXTROSE 2-4 GM/100ML-% IV SOLN
2.0000 g | INTRAVENOUS | Status: AC
Start: 2024-07-02 — End: 2024-07-03
  Administered 2024-07-02: 2 g via INTRAVENOUS
  Filled 2024-07-01: qty 100

## 2024-07-01 MED ORDER — TIZANIDINE HCL 2 MG PO TABS
2.0000 mg | ORAL_TABLET | Freq: Every day | ORAL | Status: DC
  Administered 2024-07-01: 2 mg via ORAL
  Filled 2024-07-01 (×2): qty 1

## 2024-07-01 MED ORDER — SODIUM CHLORIDE 0.9 % IV SOLN: 250.0000 mL | INTRAVENOUS | Status: DC | PRN

## 2024-07-01 MED ORDER — NOREPINEPHRINE 4 MG/250ML-% IV SOLN
0.0000 ug/min | INTRAVENOUS | Status: DC
Start: 2024-07-02 — End: 2024-07-03
  Filled 2024-07-01: qty 250

## 2024-07-01 MED ORDER — EPINEPHRINE HCL 5 MG/250ML IV SOLN IN NS
0.0000 ug/min | INTRAVENOUS | Status: DC
Start: 2024-07-02 — End: 2024-07-03
  Filled 2024-07-01: qty 250

## 2024-07-01 MED ORDER — CHLORHEXIDINE GLUCONATE 0.12 % MT SOLN
15.0000 mL | Freq: Once | OROMUCOSAL | Status: AC
  Administered 2024-07-02: 15 mL via OROMUCOSAL
  Filled 2024-07-01: qty 15

## 2024-07-01 MED ORDER — MANNITOL 20 % IV SOLN
INTRAVENOUS | Status: DC
  Filled 2024-07-01: qty 13

## 2024-07-01 MED ORDER — DEXMEDETOMIDINE HCL IN NACL 400 MCG/100ML IV SOLN
0.1000 ug/kg/h | INTRAVENOUS | Status: AC
  Administered 2024-07-02: .4 ug/kg/h via INTRAVENOUS
  Filled 2024-07-01: qty 100

## 2024-07-01 MED ORDER — HYDRALAZINE HCL 25 MG PO TABS
25.0000 mg | ORAL_TABLET | Freq: Three times a day (TID) | ORAL | Status: DC
  Administered 2024-07-01: 25 mg via ORAL
  Filled 2024-07-01 (×2): qty 1

## 2024-07-01 MED ORDER — ACETAMINOPHEN 325 MG PO TABS: 650.0000 mg | ORAL_TABLET | ORAL | Status: DC | PRN

## 2024-07-01 MED ORDER — ADULT MULTIVITAMIN W/MINERALS CH: 1.0000 | ORAL_TABLET | Freq: Every day | ORAL | Status: DC

## 2024-07-01 MED ORDER — ATORVASTATIN CALCIUM 40 MG PO TABS
40.0000 mg | ORAL_TABLET | Freq: Every day | ORAL | Status: DC
  Administered 2024-07-03 – 2024-07-07 (×5): 40 mg via ORAL
  Filled 2024-07-01 (×5): qty 1

## 2024-07-01 MED ORDER — CHLORHEXIDINE GLUCONATE CLOTH 2 % EX PADS
6.0000 | MEDICATED_PAD | Freq: Once | CUTANEOUS | Status: AC
  Administered 2024-07-01: 6 via TOPICAL

## 2024-07-01 MED ORDER — METOPROLOL TARTRATE 12.5 MG HALF TABLET
12.5000 mg | ORAL_TABLET | Freq: Once | ORAL | Status: AC
  Administered 2024-07-02: 12.5 mg via ORAL
  Filled 2024-07-01: qty 1

## 2024-07-01 MED ORDER — PHENYLEPHRINE HCL-NACL 20-0.9 MG/250ML-% IV SOLN
30.0000 ug/min | INTRAVENOUS | Status: AC
  Administered 2024-07-02: 15 ug/min via INTRAVENOUS
  Filled 2024-07-01: qty 250

## 2024-07-01 MED ORDER — HEPARIN (PORCINE) IN NACL 1000-0.9 UT/500ML-% IV SOLN
INTRAVENOUS | Status: DC | PRN
  Administered 2024-07-01 (×2): 500 mL

## 2024-07-01 MED ORDER — GABAPENTIN 400 MG PO CAPS
400.0000 mg | ORAL_CAPSULE | Freq: Every day | ORAL | Status: DC
  Administered 2024-07-01: 400 mg via ORAL
  Filled 2024-07-01: qty 1

## 2024-07-01 MED ORDER — HEPARIN 30,000 UNITS/1000 ML (OHS) CELLSAVER SOLUTION
Status: DC
  Filled 2024-07-01: qty 1000

## 2024-07-01 MED ORDER — ONDANSETRON HCL 4 MG/2ML IJ SOLN: 4.0000 mg | Freq: Four times a day (QID) | INTRAMUSCULAR | Status: DC | PRN

## 2024-07-01 MED ORDER — SODIUM CHLORIDE 0.9% FLUSH: 3.0000 mL | INTRAVENOUS | Status: DC | PRN

## 2024-07-01 MED ORDER — TEMAZEPAM 15 MG PO CAPS
15.0000 mg | ORAL_CAPSULE | Freq: Once | ORAL | Status: AC | PRN
  Administered 2024-07-01: 15 mg via ORAL
  Filled 2024-07-01: qty 1

## 2024-07-01 MED ORDER — POTASSIUM CHLORIDE 2 MEQ/ML IV SOLN
80.0000 meq | INTRAVENOUS | Status: DC
  Filled 2024-07-01: qty 40

## 2024-07-01 MED ORDER — TRANEXAMIC ACID (OHS) BOLUS VIA INFUSION
15.0000 mg/kg | INTRAVENOUS | Status: AC
  Administered 2024-07-02: 1537.5 mg via INTRAVENOUS
  Filled 2024-07-01: qty 1538

## 2024-07-01 MED ORDER — SODIUM CHLORIDE 0.9% FLUSH
3.0000 mL | Freq: Two times a day (BID) | INTRAVENOUS | Status: DC
  Administered 2024-07-01 (×2): 3 mL via INTRAVENOUS

## 2024-07-01 MED ORDER — MUPIROCIN 2 % EX OINT
1.0000 | TOPICAL_OINTMENT | Freq: Two times a day (BID) | CUTANEOUS | Status: AC
  Administered 2024-07-01 – 2024-07-05 (×6): 1 via NASAL
  Filled 2024-07-01 (×2): qty 22

## 2024-07-01 MED ORDER — ASPIRIN 81 MG PO CHEW: 81.0000 mg | CHEWABLE_TABLET | Freq: Every day | ORAL | Status: DC

## 2024-07-01 MED ORDER — PLASMA-LYTE A IV SOLN
INTRAVENOUS | Status: DC
  Filled 2024-07-01: qty 2.5

## 2024-07-01 MED ORDER — SODIUM CHLORIDE 0.9% FLUSH
3.0000 mL | Freq: Two times a day (BID) | INTRAVENOUS | Status: DC
  Administered 2024-07-01: 3 mL via INTRAVENOUS

## 2024-07-01 MED ORDER — INSULIN REGULAR(HUMAN) IN NACL 100-0.9 UT/100ML-% IV SOLN
INTRAVENOUS | Status: AC
  Administered 2024-07-02: 2.4 [IU]/h via INTRAVENOUS
  Filled 2024-07-01: qty 100

## 2024-07-01 MED ORDER — MILRINONE LACTATE IN DEXTROSE 20-5 MG/100ML-% IV SOLN
0.3000 ug/kg/min | INTRAVENOUS | Status: DC
  Filled 2024-07-01: qty 100

## 2024-07-01 MED ORDER — CHLORHEXIDINE GLUCONATE CLOTH 2 % EX PADS: 6.0000 | MEDICATED_PAD | Freq: Once | CUTANEOUS | Status: DC

## 2024-07-01 MED ORDER — TRANEXAMIC ACID (OHS) PUMP PRIME SOLUTION
2.0000 mg/kg | INTRAVENOUS | Status: DC
  Filled 2024-07-01: qty 2.05

## 2024-07-01 MED ORDER — VANCOMYCIN HCL 1.5 G IV SOLR
1500.0000 mg | INTRAVENOUS | Status: AC
  Administered 2024-07-02: 1500 mg via INTRAVENOUS
  Filled 2024-07-01: qty 30

## 2024-07-01 MED ORDER — LIDOCAINE HCL (PF) 1 % IJ SOLN
INTRAMUSCULAR | Status: DC | PRN
Start: 2024-07-01 — End: 2024-07-01
  Administered 2024-07-01: 2 mL

## 2024-07-01 SURGICAL SUPPLY — 7 items
CATH SWAN GANZ 7F STRAIGHT (CATHETERS) IMPLANT
COVER PRB 48X5XTLSCP FOLD TPE (BAG) IMPLANT
KIT MICROPUNCTURE NIT STIFF (SHEATH) IMPLANT
KIT RIGHT HEART ACIST (MISCELLANEOUS) IMPLANT
PACK CARDIAC CATHETERIZATION (CUSTOM PROCEDURE TRAY) ×2 IMPLANT
SHEATH PINNACLE 7F 10CM (SHEATH) IMPLANT
SHIELD CATHGARD ARROW (MISCELLANEOUS) IMPLANT

## 2024-07-01 NOTE — Interval H&P Note (Signed)
 History and Physical Interval Note:  07/01/2024 10:16 AM  Tim Cook  has presented today for surgery, with the diagnosis of hf.  The various methods of treatment have been discussed with the patient and family. After consideration of risks, benefits and other options for treatment, the patient has consented to  Procedure(s): RIGHT HEART CATH (N/A) as a surgical intervention.  The patient's history has been reviewed, patient examined, no change in status, stable for surgery.  I have reviewed the patient's chart and labs.  Questions were answered to the patient's satisfaction.     [name]

## 2024-07-01 NOTE — Progress Notes (Signed)
 [address]       [place] [address]             [phone]                                        Tim Cook [facility] Medical Record #[id number] Date of Birth: 30-Sep-1960   Referring: Wonda Sharper, MD Primary Care: Cristopher Bottcher, NP Primary Cardiologist:None   Chief Complaint:       Chief Complaint  Patient presents with   Aortic Stenosis   Coronary Artery Disease      History of Present Illness:     Tim Cook is a 63 y.o. male who presents for surgical evaluation of severe aortic stenosis and coronary artery disease.  He has a hx of PCI in 2017, and recently he developed chest pain after visiting with friends.  He subsequently underwent further evaluation which identified a heavily calcified aortic valve, with measured gradients in the severe range, circumflex disease, and LV dysfunction.     He denies any chest pain, shortness of breath, or syncopal episodes.  He does have neuropathy in his lower extremities and weakness in his right arm.  He has also had a lap band in the past, but denies any dysphagia.           Past Medical and Surgical History: Previous Chest Surgery: no Previous Chest Radiation: no Diabetes Mellitus: yes.  HbA1C 6.9 Creatinine:  Recent Labs       Lab Results  Component Value Date    CREATININE 1.12 03/18/2020    CREATININE 1.00 03/16/2020    CREATININE 0.84 05/19/2016              Past Medical History:  Diagnosis Date   Acute lateral meniscal tear 06/09/2016   Arthritis      knees, lower back, hands (05/18/2016)   Atherosclerotic heart disease of native coronary artery without angina pectoris      05/18/16  Cath severe 90% LAD prox, 99% mid, Circ dominant with 30% OM1, 50% OM2, 30% OM3, 60% distal circ and PL, severe stenosis nondominant RCA.  EF 55%  3.0x 20, 2.5 x 20 mm, 2.25 x 16 mm Promus premier sent to LAD Dr. Verlin    Bariatric surgery status 05/18/2016   Benign essential hypertension  01/28/2021   Bursitis of right shoulder 01/21/2019   CAD (coronary artery disease), native coronary artery      05/18/16  Cath severe 90% LAD prox, 99% mid, Circ dominant with 30% OM1, 50% OM2, 30% OM3, 60% distal circ and PL, severe stenosis nondominant RCA.  EF 55%  3.0x 20, 2.5 x 20 mm, 2.25 x 16 mm Promus premier sent to LAD Dr. Verlin    Chronic bronchitis (HCC)      most years (05/18/2016)   Chronic lower back pain     Closed fracture of right distal radius 03/12/2020   Closed fracture of right proximal humerus 03/12/2020   DDD (degenerative disc disease), lumbar     Degenerative arthritis of right knee 05/09/2017   Degenerative disc disease, lumbar 07/07/2016   Degenerative lumbar spinal stenosis 04/07/2021   Diabetes mellitus without complication (HCC)     Dislocation of left middle finger 01/21/2019   Encounter for immunization 01/28/2021   Erectile dysfunction 01/28/2021   Gastro-esophageal reflux 01/28/2021   History of nutritional  deficiency 10/26/2021   Hyperglycemia due to type 2 diabetes mellitus (HCC) 01/28/2021   Hyperlipidemia     Hypertensive heart disease without CHF     Lumbago with sciatica, right side 04/07/2021   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 type 2 diabetes mellitus (HCC) 01/28/2021   Mixed hyperlipidemia 05/18/2016   Nonallopathic lesion of lumbosacral region 07/07/2016   Nonallopathic lesion of sacral region 07/07/2016   Nonallopathic lesion of thoracic region 07/07/2016   Obesity     Radiculopathy, lumbar region 04/07/2021   Shoulder joint pain 01/28/2021   Type II diabetes mellitus (HCC)     Unstable angina (HCC)     Vitamin D  deficiency 01/28/2021               Past Surgical History:  Procedure Laterality Date   CARDIAC CATHETERIZATION N/A 05/18/2016    Procedure: Left Heart Cath and Coronary Angiography;  Surgeon: Lonni JONETTA Cash, MD;  Location: [facility] INVASIVE CV LAB;  Service:  Cardiovascular;  Laterality: N/A;   CARDIAC CATHETERIZATION N/A 05/18/2016    Procedure: Coronary Stent Intervention;  Surgeon: Lonni JONETTA Cash, MD;  Location: [facility] INVASIVE CV LAB;  Service: Cardiovascular;  Laterality: N/A;   CARPAL TUNNEL RELEASE Right 03/17/2020    Procedure: CARPAL TUNNEL RELEASE;  Surgeon: Josefina Chew, MD;  Location: [facility] ORS;  Service: Orthopedics;  Laterality: Right;   CORONARY ANGIOPLASTY       FOOT FRACTURE SURGERY Right 1970s   LAPAROSCOPIC GASTRIC BANDING   07/2007    by Dr. Gladis   ORIF HUMERUS FRACTURE Right 03/17/2020    Procedure: OPEN REDUCTION INTERNAL FIXATION (ORIF) PROXIMAL HUMERUS FRACTURE AND DISTAL RADIUS FRACTURE;  Surgeon: Josefina Chew, MD;  Location: [facility];  Service: Orthopedics;  Laterality: Right;   PILONIDAL CYST EXCISION   ~ 1985    took 8 out all at once   RIGHT/LEFT HEART CATH AND CORONARY ANGIOGRAPHY N/A 05/14/2024    Procedure: RIGHT/LEFT HEART CATH AND CORONARY ANGIOGRAPHY;  Surgeon: Wonda Sharper, MD;  Location: [facility] INVASIVE CV LAB;  Service: Cardiovascular;  Laterality: N/A;   SQUAMOUS CELL CARCINOMA EXCISION        right upper arm, left shoulder          Social History:   Tobacco Use History  Social History        Tobacco Use  Smoking Status Former   Current packs/day: 0.00   Average packs/day: 0.5 packs/day for 12.0 years (6.0 ttl pk-yrs)   Types: Cigarettes   Start date: 12/15/1979   Quit date: 12/15/1991   Years since quitting: 32.4  Smokeless Tobacco Former   Types: Snuff  Tobacco Comments    quit snuff in ~ 1981      Social History        Substance and Sexual Activity  Alcohol Use Yes   Alcohol/week: 7.0 standard drinks of alcohol   Types: 7 Cans of beer per week    Comment: More social use        Allergies  No Known Allergies     Medications:           Current Outpatient Medications  Medication Sig Dispense Refill   aspirin  EC 81 MG tablet Take 81 mg by mouth daily.       atorvastatin   (LIPITOR) 40 MG tablet Take 40 mg by mouth daily.       cetirizine (ZYRTEC) 5 MG chewable tablet Chew 5 mg by mouth daily.  FARXIGA  10 MG TABS tablet Take 10 mg by mouth daily.        gabapentin  (NEURONTIN ) 100 MG capsule TAKE 2 CAPSULES(200 MG) BY MOUTH AT BEDTIME 180 capsule 1   HYDROcodone -acetaminophen  (NORCO) 10-325 MG tablet Take 1 tablet by mouth every 6 (six) hours as needed for pain.       losartan  (COZAAR ) 100 MG tablet Take 100 mg by mouth daily.       metFORMIN  (GLUCOPHAGE ) 500 MG tablet Take 1,000 mg by mouth 2 (two) times daily with a meal.       mometasone (NASONEX) 50 MCG/ACT nasal spray Place 2 sprays into the nose daily as needed (Rhinitis).       Multiple Vitamin (MULTIVITAMIN) capsule Take 1 capsule by mouth daily.       naproxen sodium (ALEVE) 220 MG tablet Take 220 mg by mouth daily as needed (pain).       nitroGLYCERIN  (NITROSTAT ) 0.4 MG SL tablet PLACE 1 TABLET UNDER THE TONGUE EVERY 5 MINUTES AS NEEDED FOR CHEST PAIN. 25 tablet 10   tirzepatide (MOUNJARO) 12.5 MG/0.5ML Pen Inject 12.5 mg into the skin once a week.       tiZANidine  (ZANAFLEX ) 2 MG tablet TAKE 1 TABLET BY MOUTH EVERYDAY AT BEDTIME (Patient taking differently: Take 2 mg by mouth daily as needed for muscle spasms.) 30 tablet 5   loratadine  (CLARITIN ) 10 MG tablet Take 10 mg by mouth daily. (Patient not taking: Reported on 05/31/2024)          No current facility-administered medications for this visit.         (Not in a hospital admission)              Family History  Problem Relation Age of Onset   Heart disease Mother     Heart disease Father     Stroke Father     Heart attack Father     Heart disease Sister     Diabetes Mellitus I Sister     Stroke Brother              Review of Systems:    Review of Systems  Constitutional:  Negative for malaise/fatigue.  Respiratory:  Negative for shortness of breath.   Cardiovascular:  Negative for chest pain.  Neurological:  Positive for  dizziness.                  Physical Exam: BP 116/78   Pulse 92   Resp 18   Ht 5' 9 (1.753 m)   Wt 227 lb (103 kg)   SpO2 96%   BMI 33.52 kg/m  Physical Exam Constitutional:      General: He is not in acute distress.    Appearance: He is not ill-appearing.  HENT:     Head: Normocephalic and atraumatic.  Eyes:     Extraocular Movements: Extraocular movements intact.  Cardiovascular:     Rate and Rhythm: Normal rate.  Pulmonary:     Effort: Pulmonary effort is normal. No respiratory distress.  Abdominal:     General: Abdomen is flat. There is no distension.  Musculoskeletal:        General: Normal range of motion.     Cervical back: Normal range of motion.  Skin:    General: Skin is warm and dry.  Neurological:     General: No focal deficit present.     Mental Status: He is alert and oriented to person, place, and time.  Diagnostic Studies & Laboratory data: Cardiac Studies & Procedures Objective  ______________________________________________________________________________________________ CARDIAC CATHETERIZATION   CARDIAC CATHETERIZATION 05/14/2024   Conclusion   Prox LAD lesion is 10% stenosed.   Dist Cx lesion is 80% stenosed.   Prox RCA-1 lesion is 80% stenosed.   Prox RCA-2 lesion is 90% stenosed.   Mid RCA lesion is 99% stenosed.   Ost 1st Mrg to 1st Mrg lesion is 30% stenosed.   1st Mrg lesion is 50% stenosed.   3rd Mrg lesion is 30% stenosed.   Ost 4th Mrg to 4th Mrg lesion is 70% stenosed.   Ost LPDA lesion is 70% stenosed.   Ost LAD to Prox LAD lesion is 40% stenosed.   Prox Cx to Mid Cx lesion is 60% stenosed.   Non-stenotic Mid LAD lesion was previously treated.   Non-stenotic Dist LAD lesion was previously treated.   There is severe aortic valve stenosis.   1.  Patent left main with no significant stenosis 2.  Patent LAD stents with mild residual proximal LAD stenosis and mild diffuse plaquing 3.  Large, dominant left circumflex  with moderate proximal stenosis and severe distal vessel stenosis just before the PLA/PDA bifurcation 4.  Small, nondominant RCA with severe stenosis 5.  Hemodynamic data: RA mean 4 mmHg RV 32/10 mmHg PA 24/8 mean 16 mmHg Pulmonary capillary wedge pressure mean 8 mmHg LVEDP 15 mmHg Aortic valve mean gradient 39 mmHg, calculated aortic valve area 0.91 cm   Cardiac output 6.2 L/min, cardiac index 2.85   Recommendation: surgical consult for consideration AVR +/- CABG in this 63 yo diabetic patient with diffuse CAD and severe aortic stenosis.   Findings Coronary Findings Diagnostic  Dominance: Left   Left Anterior Descending There is mild diffuse disease throughout the vessel. Ost LAD to Prox LAD lesion is 40% stenosed. Prox LAD lesion is 10% stenosed. The lesion is calcified. The lesion was previously treated . Non-stenotic Mid LAD lesion was previously treated. The lesion is calcified. Non-stenotic Dist LAD lesion was previously treated. The lesion is discrete.   First Diagonal Branch Vessel is small in size.   First Septal Branch Vessel is small in size.   Second Diagonal Branch Vessel is small in size.   Left Circumflex Vessel is large. Prox Cx to Mid Cx lesion is 60% stenosed. Dist Cx lesion is 80% stenosed.   First Obtuse Marginal Branch Vessel is moderate in size. Ost 1st Mrg to 1st Mrg lesion is 30% stenosed. 1st Mrg lesion is 50% stenosed.   Third Obtuse Marginal Branch Vessel is moderate in size. 3rd Mrg lesion is 30% stenosed. The lesion is discrete.   Fourth Obtuse Marginal Branch Vessel is small in size. Ost 4th Mrg to 4th Mrg lesion is 70% stenosed.   Left Posterior Descending Artery Vessel is small in size. Ost LPDA lesion is 70% stenosed.   Right Coronary Artery Vessel is small. Prox RCA-1 lesion is 80% stenosed. The lesion is discrete. Prox RCA-2 lesion is 90% stenosed. The lesion is discrete. Mid RCA lesion is 99% stenosed. The lesion is  discrete.   Intervention   No interventions have been documented.     CARDIAC CATHETERIZATION   CARDIAC CATHETERIZATION 05/18/2016   Conclusion  Prox RCA-2 lesion, 80 %stenosed.  Prox RCA-1 lesion, 80 %stenosed.  Mid RCA lesion, 99 %stenosed.  Ost 1st Mrg to 1st Mrg lesion, 30 %stenosed.  1st Mrg lesion, 50 %stenosed.  3rd Mrg lesion, 30 %stenosed.  Ost LPDA lesion, 70 %stenosed.  Ost 4th Mrg to 4th Mrg lesion, 70 %stenosed.  Dist Cx lesion, 60 %stenosed.  A STENT PROMUS PREM MR 2.25X16 drug eluting stent was successfully placed.  Dist LAD lesion, 90 %stenosed.  Post intervention, there is a 0% residual stenosis.  A STENT PROMUS PREM MR 2.5X20 drug eluting stent was successfully placed.  Mid LAD lesion, 99 %stenosed.  Post intervention, there is a 0% residual stenosis.  A STENT PROMUS PREM MR 3.0X20 drug eluting stent was successfully placed.  Prox LAD lesion, 90 %stenosed.  Post intervention, there is a 10% residual stenosis.  The left ventricular systolic function is normal.  LV end diastolic pressure is moderately elevated.  The left ventricular ejection fraction is 50-55% by visual estimate.   1. Triple vessel CAD 2. Severe tandem stenoses in the proximal, mid and distal LAD. The proximal lesion was calcified. 3. Moderate stenosis in the distal left Circumflex system leading into relatively small left sided posterolateral branches. 4. Severe stenoses in the small non-dominant RCA 5. Overall preserved LV systolic function with elevated filling pressure. 6. Successful PTCA/DES x 3 in the proximal, mid and distal LAD (non-overlapping stents).   Recommendations: Will treat with ASA/Brilinta  for one year. Will continue beta blocker and statin. Will give one dose of IV Lasix  later today given elevated filling pressures. Medical management of disease in the distal Circumflex system and small non-dominant RCA.   Findings Coronary Findings Diagnostic   Dominance: Left   Left Anterior Descending The lesion is calcified. The lesion is calcified. The lesion is discrete.   First Diagonal Branch Vessel is small in size.   First Septal Branch Vessel is small in size.   Second Diagonal Branch Vessel is small in size.   Left Circumflex Vessel is large.   First Obtuse Marginal Branch Vessel is moderate in size.   Third Obtuse Marginal Branch Vessel is moderate in size. The lesion is discrete.   Fourth Obtuse Marginal Branch Vessel is small in size.   Left Posterior Descending Artery Vessel is small in size.   Right Coronary Artery Vessel is small. The lesion is discrete. The lesion is discrete. The lesion is discrete.   Intervention   Prox LAD lesion Angioplasty Pre-stent angioplasty was performed using a BALLOON [place] EMERGE MR [id number]. A STENT PROMUS PREM MR 3.0X20 drug eluting stent was successfully placed. Stent strut is well apposed. Post-stent angioplasty was performed using a BALLOON [place] EMERGE MR 3.25X15. The pre-interventional distal flow is normal (TIMI 3).  The post-interventional distal flow is normal (TIMI 3). The intervention was successful . No complications occurred at this lesion. There is a 10% residual stenosis post intervention.   Mid LAD lesion Angioplasty Lesion crossed with guidewire. Pre-stent angioplasty was performed using a BALLOON EMERGE MR 2.0X12. A STENT PROMUS PREM MR 2.5X20 drug eluting stent was successfully placed. Stent strut is well apposed. Post-stent angioplasty was performed using a BALLOON [place] EMERGE MR [id number]. The pre-interventional distal flow is normal (TIMI 3).  The post-interventional distal flow is normal (TIMI 3). The intervention was successful . No complications occurred at this lesion. There is a 0% residual stenosis post intervention.   Dist LAD lesion Angioplasty Lesion crossed with guidewire. Pre-stent angioplasty was performed using a BALLOON EMERGE MR 2.0X12. A STENT  PROMUS PREM MR 2.25X16 drug eluting stent was successfully placed. Stent strut is well apposed. Post-stent angioplasty was performed using a BALLOON [place] EUPHORA RX2.25X8. The pre-interventional distal flow is normal (TIMI 3).  The post-interventional distal flow  is normal (TIMI 3). The intervention was successful . No complications occurred at this lesion. There is a 0% residual stenosis post intervention.     STRESS TESTS   MYOCARDIAL PERFUSION IMAGING 12/29/2023   Interpretation Summary   Findings are consistent with no ischemia and no infarction. The study is low risk.   No ST deviation was noted.   LV perfusion is normal. There is no evidence of ischemia. There is no evidence of infarction.   Left ventricular function is abnormal. Global function is mildly reduced. There were no regional wall motion abnormalities. End diastolic cavity size is normal. End systolic cavity size is mildly enlarged.   Prior study available for comparison from 10/29/2020.   EF estimate may not be accurate as wall motion appears normal Suggest TTE correlation   ECHOCARDIOGRAM   ECHOCARDIOGRAM COMPLETE 03/12/2024   Narrative ECHOCARDIOGRAM REPORT       Patient Name:   Tim Cook Date of Exam: 03/12/2024 Medical Rec #:  [id number]       Height:       69.0 in Accession #:    [id number]      Weight:       243.0 lb Date of Birth:  03/16/1961       BSA:          2.244 m Patient Age:    62 years        BP:           124/70 mmHg Patient Gender: M               HR:           74 bpm. Exam Location:  [place]   Procedure: 2D Echo, Cardiac Doppler and Color Doppler (Both Spectral and Color Flow Doppler were utilized during procedure).   Indications:    I35.0 Nonrheumatic aortic (valve) stenosis   History:        Patient has prior history of Echocardiogram examinations, most recent 01/24/2024. CAD, Aortic Valve Disease, Signs/Symptoms:Murmur; Risk Factors:Hypertension, Diabetes, Dyslipidemia and Former  Smoker.   Sonographer:    Alan Greenhouse RDMS, RVT, RDCS Referring Phys: [phone] LAMAR PARAS KRASOWSKI   IMPRESSIONS     1. Left ventricular ejection fraction, by estimation, is 60 to 65%. The left ventricle has normal function. The left ventricle has no regional wall motion abnormalities. Left ventricular diastolic parameters are consistent with Grade I diastolic dysfunction (impaired relaxation). 2. Right ventricular systolic function is normal. The right ventricular size is normal. 3. The mitral valve is normal in structure. No evidence of mitral valve regurgitation. No evidence of mitral stenosis. 4. The aortic valve was not well visualized. Aortic valve regurgitation is not visualized. Mild to moderate aortic valve stenosis. 5. The inferior vena cava is normal in size with greater than 50% respiratory variability, suggesting right atrial pressure of 3 mmHg.   Comparison(s): Echocardiogram done 01/24/24 showed an EF of 55% with moderate to severe AS and an AV Mean Grad of 26 mmHg.   FINDINGS Left Ventricle: Left ventricular ejection fraction, by estimation, is 60 to 65%. The left ventricle has normal function. The left ventricle has no regional wall motion abnormalities. The left ventricular internal cavity size was normal in size. There is no left ventricular hypertrophy. Left ventricular diastolic parameters are consistent with Grade I diastolic dysfunction (impaired relaxation).   Right Ventricle: The right ventricular size is normal. No increase in right ventricular wall thickness. Right ventricular systolic function is normal.  Left Atrium: Left atrial size was normal in size.   Right Atrium: Right atrial size was normal in size.   Pericardium: There is no evidence of pericardial effusion.   Mitral Valve: The mitral valve is normal in structure. No evidence of mitral valve regurgitation. No evidence of mitral valve stenosis.   Tricuspid Valve: The tricuspid valve is normal in  structure. Tricuspid valve regurgitation is not demonstrated. No evidence of tricuspid stenosis.   Aortic Valve: The aortic valve was not well visualized. Aortic valve regurgitation is not visualized. Mild to moderate aortic stenosis is present. Aortic valve mean gradient measures 25.0 mmHg. Aortic valve peak gradient measures 41.9 mmHg. Aortic valve area, by VTI measures 0.81 cm.   Pulmonic Valve: The pulmonic valve was normal in structure. Pulmonic valve regurgitation is not visualized. No evidence of pulmonic stenosis.   Aorta: The aortic root is normal in size and structure.   Venous: The inferior vena cava is normal in size with greater than 50% respiratory variability, suggesting right atrial pressure of 3 mmHg.   IAS/Shunts: No atrial level shunt detected by color flow Doppler.     LEFT VENTRICLE PLAX 2D LVIDd:         4.30 cm     Diastology LVIDs:         2.50 cm     LV e' medial:    5.77 cm/s LV PW:         1.10 cm     LV E/e' medial:  10.4 LV IVS:        1.00 cm     LV e' lateral:   7.07 cm/s LVOT diam:     2.00 cm     LV E/e' lateral: 8.5 LV SV:         54 LV SV Index:   24 LVOT Area:     3.14 cm   LV Volumes (MOD) LV vol d, MOD A2C: 49.5 ml LV vol d, MOD A4C: 56.6 ml LV vol s, MOD A2C: 17.0 ml LV vol s, MOD A4C: 21.5 ml LV SV MOD A2C:     32.5 ml LV SV MOD A4C:     56.6 ml LV SV MOD BP:      33.5 ml   RIGHT VENTRICLE RV S prime:     8.05 cm/s TAPSE (M-mode): 1.9 cm   LEFT ATRIUM             Index        RIGHT ATRIUM           Index LA diam:        2.70 cm 1.20 cm/m   RA Area:     10.00 cm LA Vol (A2C):   33.3 ml 14.84 ml/m  RA Volume:   19.10 ml  8.51 ml/m LA Vol (A4C):   41.9 ml 18.67 ml/m LA Biplane Vol: 38.1 ml 16.98 ml/m AORTIC VALVE AV Area (Vmax):    0.70 cm AV Area (Vmean):   0.79 cm AV Area (VTI):     0.81 cm AV Vmax:           323.50 cm/s AV Vmean:          223.400 cm/s AV VTI:            0.673 m AV Peak Grad:      41.9 mmHg AV Mean  Grad:      25.0 mmHg LVOT Vmax:         72.50 cm/s  LVOT Vmean:        56.500 cm/s LVOT VTI:          0.173 m LVOT/AV VTI ratio: 0.26   AORTA Ao Root diam: 3.40 cm Ao Asc diam:  3.10 cm   MITRAL VALVE MV Area (PHT): 2.56 cm    SHUNTS MV Decel Time: 296 msec    Systemic VTI:  0.17 m MV E velocity: 60.00 cm/s  Systemic Diam: 2.00 cm MV A velocity: 78.00 cm/s MV E/A ratio:  0.77   Jennifer Crape MD Electronically signed by Jennifer Crape MD Signature Date/Time: 03/13/2024/8:53:47 AM       Final       CT SCANS   CT CARDIAC SCORING (SELF PAY ONLY) 04/12/2024   Addendum 04/14/2024 11:15 AM ADDENDUM REPORT: 04/14/2024 11:13   EXAM: OVER-READ INTERPRETATION  CT CHEST   The following report is an over-read performed by radiologist Dr. Andrea Gasman of [facility] on 04/14/2024. This over-read does not include interpretation of cardiac or coronary anatomy or pathology. The coronary calcium  score interpretation by the cardiologist is attached.   COMPARISON:  None.   FINDINGS: Vascular: Aortic atherosclerosis. The included aorta is normal in caliber.   Mediastinum/nodes: No adenopathy or mass. Unremarkable esophagus.   Lungs: No focal airspace disease. Benign calcified granuloma in the left lower lobe. No pleural fluid. The included airways are patent.   Upper abdomen: No acute findings.  Gastric band in place.   Musculoskeletal: There are no acute or suspicious osseous abnormalities. Thoracic spondylosis.   IMPRESSION: Aortic Atherosclerosis (ICD10-I70.0).     Electronically Signed By: Andrea Gasman M.D. On: 04/14/2024 11:13   Narrative : CLINICAL DATA:  Cardiovascular Disease Risk stratification   EXAM:   Coronary Calcium  Score   TECHNIQUE:   A gated, non-contrast computed tomography scan of the heart was   performed using 3mm slice thickness. Axial images were analyzed on a   dedicated workstation. Calcium  scoring of the coronary  arteries was   performed using the Agatston method.   FINDINGS:   Coronary Calcium  Score:   Left main: 0   Left anterior descending artery: 2692   Left circumflex artery: 1661   Right coronary artery: 278   Total: 4630   Pericardium: Normal.   Ascending Aorta: Normal caliber.   Pulmonary artery: Normal caliber   Non-cardiac: See separate report from [facility].   IMPRESSION:   Coronary calcium  score of 4630. This was 19 percentile for age-, race-,   and sex-matched controls.   Calcium  Score of aortic valve - 2268   RECOMMENDATIONS:   Coronary artery calcium  (CAC) score is a strong predictor of   incident coronary heart disease (CHD) and provides predictive   information beyond traditional risk factors. CAC scoring is   reasonable to use in the decision to withhold, postpone, or initiate   statin therapy in intermediate-risk or selected borderline-risk   asymptomatic adults (age 18-75 years and LDL-C >=70 to <190 mg/dL)   who do not have diabetes or established atherosclerotic   cardiovascular disease (ASCVD).* In intermediate-risk (10-year ASCVD   risk >=7.5% to <20%) adults or selected borderline-risk (10-year   ASCVD risk >=5% to <7.5%) adults in whom a CAC score is measured for   the purpose of making a treatment decision the following   recommendations have been made:   If CAC=0, it is reasonable to withhold statin therapy and reassess   in 5 to 10 years, as long as higher risk conditions  are absent   (diabetes mellitus, family history of premature CHD in first degree   relatives (males <55 years; females <65 years), cigarette smoking,   or LDL >=190 mg/dL).   If CAC is 1 to 99, it is reasonable to initiate statin therapy for   patients >=52 years of age.   If CAC is >=100 or >=75th percentile, it is reasonable to initiate   statin therapy at any age.   Cardiology referral should be considered for patients with CAC    scores >=400 or >=75th percentile.   *2018 AHA/ACC/AACVPR/AAPA/ABC/ACPM/ADA/AGS/APhA/ASPC/NLA/PCNA   Guideline on the Management of Blood Cholesterol: A Report of the   American College of Cardiology/American Heart Association Task Force   on Clinical Practice Guidelines. J Am Coll Cardiol.   2019;73(24):3168-3209.   Electronically Signed: By: Lamar Fitch M.D. On: 04/12/2024 17:11       ______________________________________________________________________________________________       EKG: Sinus   I have independently reviewed the above radiologic studies and discussed with the patient    Recent Lab Findings: Recent Labs       Lab Results  Component Value Date    WBC 11.4 (H) 03/18/2020    HGB 13.6 05/14/2024    HCT 40.0 05/14/2024    PLT 342 03/18/2020    GLUCOSE 135 (H) 03/18/2020    CHOL 125 12/07/2018    TRIG 103 12/07/2018    HDL 57 12/07/2018    LDLCALC 47 12/07/2018    ALT 22 12/07/2018    AST 20 12/07/2018    NA 138 05/14/2024    K 4.2 05/14/2024    CL 98 03/18/2020    CREATININE 1.12 03/18/2020    BUN 24 (H) 03/18/2020    CO2 24 03/18/2020    HGBA1C 6.9 (H) 03/16/2020         Intervention RA mean 4 mmHg RV 32/10 mmHg PA 24/8 mean 16 mmHg Pulmonary capillary wedge pressure mean 8 mmHg LVEDP 15 mmHg Aortic valve mean gradient 39 mmHg, calculated aortic valve area 0.91 cm   Cardiac output 6.2 L/min, cardiac index 2.85     Assessment / Plan:   63 y.o. male with with severe aortic stenosis by cath, OM and LPDA disease, and LV dysfunction.  MRI and RHC reviewed.  AVR CABG2.  Of note, due to his lap band, a TEE may be challenging.

## 2024-07-01 NOTE — Progress Notes (Signed)
 Heart Failure Navigator Progress Note  Assessed for Heart & Vascular TOC clinic readiness.  Patient does not meet criteria due to Advanced Heart Failure Team consulted with Dr. Alease Amend. .   Navigator will sign off at this time.   Randie Bustle, BSN, Scientist, clinical (histocompatibility and immunogenetics) Only

## 2024-07-01 NOTE — Consult Note (Signed)
 Advanced Heart Failure Team Consult Note   Primary Physician: Cristopher Bottcher, NP Cardiologist:  Lamar Fitch, MD  Reason for Consultation: Pre-op optimization  HPI:    Tim Cook is seen today for evaluation of pre-op optimization at the request of Dr. Shyrl.  Tim Cook is a 63 y.o. male with severe aortic stenosis, HTN, DB, OSA and CAD (s/p DES to prox and mid LAD 17').  Followed by Dr. Krasowski. Recently being worked up for mod-severe aortic valve stenosis seen on echo 4/25. Referred to TCTS for further eval. Dr. Shyrl saw in clinic and arranged cMRI. Concern with RVEF at 36%. Now admitted for pre-op optimization with RHC and echo.   RHC today with near normal filling pressures. Normal CI by TD. Mild who group II PH. Well compensated RV function. RA 5, PA 35/15 (22), PCWP 15 mmHg with v waves to 23 mmHg, TD CO/CI 5.57/2.56, PVR <2, PAPi 4. +  Seen post RHC. Family at bedside. Feels great, denies CP/SOB.   Cardiac studies reviewed:  cMRI 9/25: LVEF 45%, RVEF 36%, no LGE to suggest scar. Lipomatous hypertrophy of interarterial septum.  L/RHC 8/25: patent LAD stents with mild residual prox LAD stenosis and mild diffuse plaquing. Large dom LCx with mod proximal stenosis and severe distal vessel stenosis. Small nondominant RCA with severe stenosis. RA 4, PA 24/8 (16), PCWP 8 mmHg, LVEDP 15 mmHg, AV mean gradient 39 mmHg, calculated AVA 0.91 cm. CO 6.2/2.85.  Echo 6/25: EF 60-65%, GIDD, LA/RA normal, mild-mod aortic valve stenosis, AV mean gradient , AVA 0.81 Echo 4/25: EF 55%, GIDD, RV normal, LA mildly dilated, trivial MR, mod calcification or aortic valve, mod-severe AV stenosis, AVA 0.78, AV mean gradient 26 mmHg  Home Medications Prior to Admission medications   Medication Sig Start Date End Date Taking? Authorizing Provider  acetaminophen  (TYLENOL ) 500 MG tablet Take 1,500-2,000 mg by mouth every 6 (six) hours as needed for moderate pain (pain score  4-6).   Yes [provider]  aspirin  EC 81 MG tablet Take 81 mg by mouth daily.   Yes [provider]  atorvastatin  (LIPITOR) 40 MG tablet Take 40 mg by mouth daily.   Yes [provider]  cetirizine (ZYRTEC) 10 MG tablet Take 10 mg by mouth daily.   Yes [provider]  FARXIGA  10 MG TABS tablet Take 10 mg by mouth daily.  01/06/19  Yes [provider]  gabapentin  (NEURONTIN ) 100 MG capsule TAKE 2 CAPSULES(200 MG) BY MOUTH AT BEDTIME Patient taking differently: Take 400 mg by mouth at bedtime. 05/29/24  Yes Smith, Zachary M, DO  losartan  (COZAAR ) 100 MG tablet Take 100 mg by mouth daily.   Yes [provider]  metFORMIN  (GLUCOPHAGE ) 500 MG tablet Take 1,000 mg by mouth 2 (two) times daily with a meal.   Yes [provider]  mometasone (NASONEX) 50 MCG/ACT nasal spray Place 2 sprays into the nose daily as needed (Rhinitis). 02/27/24  Yes [provider]  Multiple Vitamin (MULTIVITAMIN) capsule Take 1 capsule by mouth daily.   Yes [provider]  naproxen sodium (ALEVE) 220 MG tablet Take 220 mg by mouth daily as needed (pain).   Yes [provider]  tirzepatide CLOYDE) 12.5 MG/0.5ML Pen Inject 12.5 mg into the skin once a week. 01/25/24  Yes [provider]  tiZANidine  (ZANAFLEX ) 2 MG tablet TAKE 1 TABLET BY MOUTH EVERYDAY AT BEDTIME 06/26/23  Yes Smith, Zachary M, DO  nitroGLYCERIN  (NITROSTAT ) 0.4  MG SL tablet PLACE 1 TABLET UNDER THE TONGUE EVERY 5 MINUTES AS NEEDED FOR CHEST PAIN. 07/21/23   Bernie Lamar PARAS, MD    Past Medical History: Past Medical History:  Diagnosis Date   Acute lateral meniscal tear 06/09/2016   Arthritis    knees, lower back, hands (05/18/2016)   Atherosclerotic heart disease of native coronary artery without angina pectoris    05/18/16  Cath severe 90% LAD prox, 99% mid, Circ dominant with 30% OM1, 50% OM2, 30% OM3, 60% distal circ and PL, severe stenosis nondominant  RCA.  EF 55%  3.0x 20, 2.5 x 20 mm, 2.25 x 16 mm Promus premier sent to LAD Dr. Verlin    Bariatric surgery status 05/18/2016   Benign essential hypertension 01/28/2021   Bursitis of right shoulder 01/21/2019   CAD (coronary artery disease), native coronary artery    05/18/16  Cath severe 90% LAD prox, 99% mid, Circ dominant with 30% OM1, 50% OM2, 30% OM3, 60% distal circ and PL, severe stenosis nondominant RCA.  EF 55%  3.0x 20, 2.5 x 20 mm, 2.25 x 16 mm Promus premier sent to LAD Dr. Verlin    Chronic bronchitis (HCC)    most years (05/18/2016)   Chronic lower back pain    Closed fracture of right distal radius 03/12/2020   Closed fracture of right proximal humerus 03/12/2020   DDD (degenerative disc disease), lumbar    Degenerative arthritis of right knee 05/09/2017   Degenerative disc disease, lumbar 07/07/2016   Degenerative lumbar spinal stenosis 04/07/2021   Diabetes mellitus without complication (HCC)    Dislocation of left middle finger 01/21/2019   Encounter for immunization 01/28/2021   Erectile dysfunction 01/28/2021   Gastro-esophageal reflux 01/28/2021   History of nutritional deficiency 10/26/2021   Hyperglycemia due to type 2 diabetes mellitus (HCC) 01/28/2021   Hyperlipidemia    Hypertensive heart disease without CHF    Lumbago with sciatica, right side 04/07/2021   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 type 2 diabetes mellitus (HCC) 01/28/2021   Mixed hyperlipidemia 05/18/2016   Nonallopathic lesion of lumbosacral region 07/07/2016   Nonallopathic lesion of sacral region 07/07/2016   Nonallopathic lesion of thoracic region 07/07/2016   Obesity    Radiculopathy, lumbar region 04/07/2021   Shoulder joint pain 01/28/2021   Type II diabetes mellitus (HCC)    Unstable angina (HCC)    Vitamin D  deficiency 01/28/2021    Past Surgical History: Past Surgical History:  Procedure Laterality Date   CARDIAC CATHETERIZATION  N/A 05/18/2016   Procedure: Left Heart Cath and Coronary Angiography;  Surgeon: Lonni JONETTA Verlin, MD;  Location: [facility] INVASIVE CV LAB;  Service: Cardiovascular;  Laterality: N/A;   CARDIAC CATHETERIZATION N/A 05/18/2016   Procedure: Coronary Stent Intervention;  Surgeon: Lonni JONETTA Verlin, MD;  Location: [facility] INVASIVE CV LAB;  Service: Cardiovascular;  Laterality: N/A;   CARPAL TUNNEL RELEASE Right 03/17/2020   Procedure: CARPAL TUNNEL RELEASE;  Surgeon: Josefina Chew, MD;  Location: [facility] ORS;  Service: Orthopedics;  Laterality: Right;   CORONARY ANGIOPLASTY     FOOT FRACTURE SURGERY Right 1970s   LAPAROSCOPIC GASTRIC BANDING  07/2007   by Dr. Gladis   ORIF HUMERUS FRACTURE Right 03/17/2020   Procedure: OPEN REDUCTION INTERNAL FIXATION (ORIF) PROXIMAL HUMERUS FRACTURE AND DISTAL RADIUS FRACTURE;  Surgeon: Josefina Chew, MD;  Location: [facility] ORS;  Service: Orthopedics;  Laterality: Right;   PILONIDAL CYST EXCISION  ~ 1985   took 8 out all at once  RIGHT/LEFT HEART CATH AND CORONARY ANGIOGRAPHY N/A 05/14/2024   Procedure: RIGHT/LEFT HEART CATH AND CORONARY ANGIOGRAPHY;  Surgeon: Wonda Sharper, MD;  Location: [facility] INVASIVE CV LAB;  Service: Cardiovascular;  Laterality: N/A;   SQUAMOUS CELL CARCINOMA EXCISION     right upper arm, left shoulder    Family History: Family History  Problem Relation Age of Onset   Heart disease Mother    Heart disease Father    Stroke Father    Heart attack Father    Heart disease Sister    Diabetes Mellitus I Sister    Stroke Brother     Social History: Social History   Socioeconomic History   Marital status: Married    Spouse name: Not on file   Number of children: Not on file   Years of education: Not on file   Highest education level: Not on file  Occupational History   Not on file  Tobacco Use   Smoking status: Former    Current packs/day: 0.00    Average packs/day: 0.5 packs/day for 12.0 years (6.0 ttl pk-yrs)    Types: Cigarettes     Start date: 12/15/1979    Quit date: 12/15/1991    Years since quitting: 32.5   Smokeless tobacco: Former    Types: Snuff   Tobacco comments:    quit snuff in ~ 1981  Vaping Use   Vaping status: Never Used  Substance and Sexual Activity   Alcohol use: Yes    Alcohol/week: 7.0 standard drinks of alcohol    Types: 7 Cans of beer per week    Comment: More social use   Drug use: No   Sexual activity: Yes  Other Topics Concern   Not on file  Social History Narrative   Not on file   Social Drivers of Health   Financial Resource Strain: Not on file  Food Insecurity: Not on file  Transportation Needs: Not on file  Physical Activity: Not on file  Stress: Not on file  Social Connections: Not on file    Allergies:  No Known Allergies  Objective:    Vital Signs:   Temp:  [98.1 F (36.7 C)] 98.1 F (36.7 C) (10/06 0649) Pulse Rate:  [89] 89 (10/06 0649) Resp:  [18] 18 (10/06 0649) BP: (151)/(81) 151/81 (10/06 0649) SpO2:  [98 %-100 %] 100 % (10/06 1003) Weight:  [102.5 kg] 102.5 kg (10/06 0649)    Weight change: Filed Weights   07/01/24 0649  Weight: 102.5 kg    Intake/Output:  No intake or output data in the 24 hours ending 07/01/24 1024    Physical Exam    General:  well appearing.  No respiratory difficulty Neck: JVD flat.  Cor: Regular rate & rhythm. No murmurs. Lungs: clear Extremities: no edema  Neuro: alert & oriented x 3. Affect pleasant.   Telemetry   NSR 70s (Personally reviewed)    EKG    NSR 82 bpm (Personally reviewed)    Labs   Basic Metabolic Panel: Recent Labs  Lab 07/01/24 0744  NA 137  K 4.7  CL 101  CO2 25  GLUCOSE 97  BUN 14  CREATININE 1.13  CALCIUM  9.3    Liver Function Tests: No results for input(s): AST, ALT, ALKPHOS, BILITOT, PROT, ALBUMIN in the last 168 hours. No results for input(s): LIPASE, AMYLASE in the last 168 hours. No results for input(s): AMMONIA in the last 168 hours.  CBC: Recent  Labs  Lab 07/01/24 0744  WBC 7.7  HGB 15.3  HCT 44.7  MCV 94.1  PLT 261    Cardiac Enzymes: No results for input(s): CKTOTAL, CKMB, CKMBINDEX, TROPONINI in the last 168 hours.  BNP: BNP (last 3 results) No results for input(s): BNP in the last 8760 hours.  ProBNP (last 3 results) No results for input(s): PROBNP in the last 8760 hours.   CBG: Recent Labs  Lab 07/01/24 0656  GLUCAP 112*    Coagulation Studies: No results for input(s): LABPROT, INR in the last 72 hours.   Imaging   No results found.   Medications:     Current Medications:  sodium chloride  flush  3 mL Intravenous Q12H    Infusions:  sodium chloride         Patient Profile   Tim Cook is a 63 y.o. male with severe aortic stenosis, HTN, DB, OSA and CAD. AHF team to see for pre-op optimization.   Assessment/Plan   Severe aortic stenosis - L/RHC 8/25: patent LAD stents with mild residual prox LAD stenosis and mild diffuse plaquing. Large dom LCx with mod proximal stenosis and severe distal vessel stenosis. Small nondominant RCA with severe stenosis. RA 4, PA 24/8 (16), PCWP 8 mmHg, LVEDP 15 mmHg, AV mean gradient 39 mmHg, calculated AVA 0.91 cm. CO 6.2/2.85.  - cMRI 9/25: LVEF 45%, RVEF 36%, no LGE to suggest scar. Lipomatous hypertrophy of interarterial septum.  - RHC today with near normal filling pressures. Normal CI by TD. Mild who group II PH. Well compensated RV function. RA 5, PA 35/15 (22), PCWP 15 mmHg with v waves to 23 mmHg, TD CO/CI 5.57/2.56, PVR <2, PAPi 4.  - Update echo today - Hold Farxiga /Losartan  with upcoming OR - TCTS following. Plan for CABG + TAVR  CAD - See LHC above.  - Followed by TCTS. Plan for CABG tomorrow.  - Denies CP - Continue ASA/statin  HTN - Start hydralazine  25 mg TID for BP control while holding GDMT.  - SBP 150s   Length of Stay: 0  Beckey LITTIE Coe, NP  07/01/2024, 10:24 AM   Advanced Heart Failure Team Pager [phone] (M-F;  7a - 5p)  Please contact [facility] for night-coverage after hours (4p -7a ) and weekends on [email or web address]

## 2024-07-02 ENCOUNTER — Inpatient Hospital Stay (HOSPITAL_COMMUNITY): Payer: Self-pay

## 2024-07-02 ENCOUNTER — Encounter (HOSPITAL_COMMUNITY): Payer: Self-pay | Admitting: Cardiology

## 2024-07-02 ENCOUNTER — Inpatient Hospital Stay (HOSPITAL_COMMUNITY)
Admission: RE | Disposition: A | Discharge status: Home / Self Care | Payer: Self-pay | Source: Home / Self Care | Attending: Thoracic Surgery (Cardiothoracic Vascular Surgery)

## 2024-07-02 ENCOUNTER — Inpatient Hospital Stay (HOSPITAL_COMMUNITY)

## 2024-07-02 DIAGNOSIS — Z951 Presence of aortocoronary bypass graft: Secondary | ICD-10-CM

## 2024-07-02 DIAGNOSIS — R739 Hyperglycemia, unspecified: Secondary | ICD-10-CM

## 2024-07-02 DIAGNOSIS — E871 Hypo-osmolality and hyponatremia: Secondary | ICD-10-CM

## 2024-07-02 DIAGNOSIS — I5030 Unspecified diastolic (congestive) heart failure: Secondary | ICD-10-CM

## 2024-07-02 DIAGNOSIS — Z952 Presence of prosthetic heart valve: Secondary | ICD-10-CM

## 2024-07-02 DIAGNOSIS — I350 Nonrheumatic aortic (valve) stenosis: Secondary | ICD-10-CM

## 2024-07-02 DIAGNOSIS — M545 Low back pain, unspecified: Secondary | ICD-10-CM

## 2024-07-02 DIAGNOSIS — E785 Hyperlipidemia, unspecified: Secondary | ICD-10-CM

## 2024-07-02 DIAGNOSIS — I10 Essential (primary) hypertension: Secondary | ICD-10-CM | POA: Diagnosis not present

## 2024-07-02 DIAGNOSIS — I2510 Atherosclerotic heart disease of native coronary artery without angina pectoris: Secondary | ICD-10-CM | POA: Diagnosis not present

## 2024-07-02 DIAGNOSIS — Z87891 Personal history of nicotine dependence: Secondary | ICD-10-CM

## 2024-07-02 DIAGNOSIS — K219 Gastro-esophageal reflux disease without esophagitis: Secondary | ICD-10-CM

## 2024-07-02 HISTORY — PX: AORTIC VALVE REPLACEMENT: SHX41

## 2024-07-02 HISTORY — PX: INTRAOPERATIVE TRANSESOPHAGEAL ECHOCARDIOGRAM: SHX5062

## 2024-07-02 HISTORY — PX: CORONARY ARTERY BYPASS GRAFT: SHX141

## 2024-07-02 LAB — POCT I-STAT 7, (LYTES, BLD GAS, ICA,H+H)
Acid-Base Excess: 0 mmol/L (ref 0.0–2.0)
Acid-Base Excess: 1 mmol/L (ref 0.0–2.0)
Acid-Base Excess: 2 mmol/L (ref 0.0–2.0)
Acid-Base Excess: 0 mmol/L (ref 0.0–2.0)
Acid-Base Excess: 2 mmol/L (ref 0.0–2.0)
Acid-base deficit: 5 mmol/L — ABNORMAL HIGH (ref 0.0–2.0)
Bicarbonate: 25.7 mmol/L (ref 20.0–28.0)
Bicarbonate: 25.3 mmol/L (ref 20.0–28.0)
Bicarbonate: 26.1 mmol/L (ref 20.0–28.0)
Bicarbonate: 24.7 mmol/L (ref 20.0–28.0)
Bicarbonate: 19.7 mmol/L — ABNORMAL LOW (ref 20.0–28.0)
Bicarbonate: 27.2 mmol/L (ref 20.0–28.0)
Calcium, Ion: 1.02 mmol/L — ABNORMAL LOW (ref 1.15–1.40)
Calcium, Ion: 0.99 mmol/L — ABNORMAL LOW (ref 1.15–1.40)
Calcium, Ion: 1.02 mmol/L — ABNORMAL LOW (ref 1.15–1.40)
Calcium, Ion: 1.26 mmol/L (ref 1.15–1.40)
Calcium, Ion: 1.17 mmol/L (ref 1.15–1.40)
Calcium, Ion: 1.19 mmol/L (ref 1.15–1.40)
HCT: 29 % — ABNORMAL LOW (ref 39.0–52.0)
HCT: 28 % — ABNORMAL LOW (ref 39.0–52.0)
HCT: 28 % — ABNORMAL LOW (ref 39.0–52.0)
HCT: 27 % — ABNORMAL LOW (ref 39.0–52.0)
HCT: 33 % — ABNORMAL LOW (ref 39.0–52.0)
HCT: 34 % — ABNORMAL LOW (ref 39.0–52.0)
Hemoglobin: 9.9 g/dL — ABNORMAL LOW (ref 13.0–17.0)
Hemoglobin: 9.5 g/dL — ABNORMAL LOW (ref 13.0–17.0)
Hemoglobin: 9.5 g/dL — ABNORMAL LOW (ref 13.0–17.0)
Hemoglobin: 9.2 g/dL — ABNORMAL LOW (ref 13.0–17.0)
Hemoglobin: 11.2 g/dL — ABNORMAL LOW (ref 13.0–17.0)
Hemoglobin: 11.6 g/dL — ABNORMAL LOW (ref 13.0–17.0)
O2 Saturation: 100 %
O2 Saturation: 100 %
O2 Saturation: 100 %
O2 Saturation: 97 %
O2 Saturation: 99 %
O2 Saturation: 98 %
Patient temperature: 36.5
Patient temperature: 36.9
Potassium: 4 mmol/L (ref 3.5–5.1)
Potassium: 4.7 mmol/L (ref 3.5–5.1)
Potassium: 4.9 mmol/L (ref 3.5–5.1)
Potassium: 4.1 mmol/L (ref 3.5–5.1)
Potassium: 3.3 mmol/L — ABNORMAL LOW (ref 3.5–5.1)
Potassium: 4.4 mmol/L (ref 3.5–5.1)
Sodium: 139 mmol/L (ref 135–145)
Sodium: 138 mmol/L (ref 135–145)
Sodium: 138 mmol/L (ref 135–145)
Sodium: 138 mmol/L (ref 135–145)
Sodium: 143 mmol/L (ref 135–145)
Sodium: 141 mmol/L (ref 135–145)
TCO2: 27 mmol/L (ref 22–32)
TCO2: 26 mmol/L (ref 22–32)
TCO2: 27 mmol/L (ref 22–32)
TCO2: 26 mmol/L (ref 22–32)
TCO2: 21 mmol/L — ABNORMAL LOW (ref 22–32)
TCO2: 29 mmol/L (ref 22–32)
pCO2 arterial: 44.3 mmHg (ref 32–48)
pCO2 arterial: 37.4 mmHg (ref 32–48)
pCO2 arterial: 38.6 mmHg (ref 32–48)
pCO2 arterial: 39 mmHg (ref 32–48)
pCO2 arterial: 32 mmHg (ref 32–48)
pCO2 arterial: 46.3 mmHg (ref 32–48)
pH, Arterial: 7.371 (ref 7.35–7.45)
pH, Arterial: 7.437 (ref 7.35–7.45)
pH, Arterial: 7.438 (ref 7.35–7.45)
pH, Arterial: 7.41 (ref 7.35–7.45)
pH, Arterial: 7.394 (ref 7.35–7.45)
pH, Arterial: 7.377 (ref 7.35–7.45)
pO2, Arterial: 370 mmHg — ABNORMAL HIGH (ref 83–108)
pO2, Arterial: 384 mmHg — ABNORMAL HIGH (ref 83–108)
pO2, Arterial: 334 mmHg — ABNORMAL HIGH (ref 83–108)
pO2, Arterial: 93 mmHg (ref 83–108)
pO2, Arterial: 141 mmHg — ABNORMAL HIGH (ref 83–108)
pO2, Arterial: 106 mmHg (ref 83–108)

## 2024-07-02 LAB — PROTIME-INR
INR: 1.3 — ABNORMAL HIGH (ref 0.8–1.2)
Prothrombin Time: 17.3 s — ABNORMAL HIGH (ref 11.4–15.2)

## 2024-07-02 LAB — CBC
HCT: 42.6 % (ref 39.0–52.0)
HCT: 38 % — ABNORMAL LOW (ref 39.0–52.0)
HCT: 35.9 % — ABNORMAL LOW (ref 39.0–52.0)
Hemoglobin: 14.4 g/dL (ref 13.0–17.0)
Hemoglobin: 12.9 g/dL — ABNORMAL LOW (ref 13.0–17.0)
Hemoglobin: 12.2 g/dL — ABNORMAL LOW (ref 13.0–17.0)
MCH: 31.6 pg (ref 26.0–34.0)
MCH: 31.8 pg (ref 26.0–34.0)
MCH: 31.9 pg (ref 26.0–34.0)
MCHC: 33.8 g/dL (ref 30.0–36.0)
MCHC: 33.9 g/dL (ref 30.0–36.0)
MCHC: 34 g/dL (ref 30.0–36.0)
MCV: 93.6 fL (ref 80.0–100.0)
MCV: 93.6 fL (ref 80.0–100.0)
MCV: 93.7 fL (ref 80.0–100.0)
Platelets: 237 K/uL (ref 150–400)
Platelets: 165 K/uL (ref 150–400)
Platelets: 153 K/uL (ref 150–400)
RBC: 4.55 MIL/uL (ref 4.22–5.81)
RBC: 4.06 MIL/uL — ABNORMAL LOW (ref 4.22–5.81)
RBC: 3.83 MIL/uL — ABNORMAL LOW (ref 4.22–5.81)
RDW: 12 % (ref 11.5–15.5)
RDW: 12 % (ref 11.5–15.5)
RDW: 12.2 % (ref 11.5–15.5)
WBC: 7.5 K/uL (ref 4.0–10.5)
WBC: 13.9 K/uL — ABNORMAL HIGH (ref 4.0–10.5)
WBC: 11.6 K/uL — ABNORMAL HIGH (ref 4.0–10.5)
nRBC: 0 % (ref 0.0–0.2)
nRBC: 0 % (ref 0.0–0.2)
nRBC: 0 % (ref 0.0–0.2)

## 2024-07-02 LAB — POCT I-STAT EG7
Acid-Base Excess: 2 mmol/L (ref 0.0–2.0)
Bicarbonate: 27.5 mmol/L (ref 20.0–28.0)
Calcium, Ion: 1.06 mmol/L — ABNORMAL LOW (ref 1.15–1.40)
HCT: 29 % — ABNORMAL LOW (ref 39.0–52.0)
Hemoglobin: 9.9 g/dL — ABNORMAL LOW (ref 13.0–17.0)
O2 Saturation: 80 %
Potassium: 3.5 mmol/L (ref 3.5–5.1)
Sodium: 138 mmol/L (ref 135–145)
TCO2: 29 mmol/L (ref 22–32)
pCO2, Ven: 49.3 mmHg (ref 44–60)
pH, Ven: 7.355 (ref 7.25–7.43)
pO2, Ven: 47 mmHg — ABNORMAL HIGH (ref 32–45)

## 2024-07-02 LAB — POCT I-STAT, CHEM 8
BUN: 15 mg/dL (ref 8–23)
BUN: 15 mg/dL (ref 8–23)
BUN: 13 mg/dL (ref 8–23)
BUN: 14 mg/dL (ref 8–23)
BUN: 14 mg/dL (ref 8–23)
Calcium, Ion: 1.28 mmol/L (ref 1.15–1.40)
Calcium, Ion: 1.22 mmol/L (ref 1.15–1.40)
Calcium, Ion: 1.01 mmol/L — ABNORMAL LOW (ref 1.15–1.40)
Calcium, Ion: 1.03 mmol/L — ABNORMAL LOW (ref 1.15–1.40)
Calcium, Ion: 1.27 mmol/L (ref 1.15–1.40)
Chloride: 99 mmol/L (ref 98–111)
Chloride: 103 mmol/L (ref 98–111)
Chloride: 102 mmol/L (ref 98–111)
Chloride: 104 mmol/L (ref 98–111)
Chloride: 102 mmol/L (ref 98–111)
Creatinine, Ser: 1 mg/dL (ref 0.61–1.24)
Creatinine, Ser: 1 mg/dL (ref 0.61–1.24)
Creatinine, Ser: 0.9 mg/dL (ref 0.61–1.24)
Creatinine, Ser: 0.9 mg/dL (ref 0.61–1.24)
Creatinine, Ser: 0.9 mg/dL (ref 0.61–1.24)
Glucose, Bld: 116 mg/dL — ABNORMAL HIGH (ref 70–99)
Glucose, Bld: 88 mg/dL (ref 70–99)
Glucose, Bld: 92 mg/dL (ref 70–99)
Glucose, Bld: 118 mg/dL — ABNORMAL HIGH (ref 70–99)
Glucose, Bld: 177 mg/dL — ABNORMAL HIGH (ref 70–99)
HCT: 39 % (ref 39.0–52.0)
HCT: 39 % (ref 39.0–52.0)
HCT: 29 % — ABNORMAL LOW (ref 39.0–52.0)
HCT: 28 % — ABNORMAL LOW (ref 39.0–52.0)
HCT: 30 % — ABNORMAL LOW (ref 39.0–52.0)
Hemoglobin: 13.3 g/dL (ref 13.0–17.0)
Hemoglobin: 13.3 g/dL (ref 13.0–17.0)
Hemoglobin: 9.9 g/dL — ABNORMAL LOW (ref 13.0–17.0)
Hemoglobin: 9.5 g/dL — ABNORMAL LOW (ref 13.0–17.0)
Hemoglobin: 10.2 g/dL — ABNORMAL LOW (ref 13.0–17.0)
Potassium: 3.9 mmol/L (ref 3.5–5.1)
Potassium: 3.7 mmol/L (ref 3.5–5.1)
Potassium: 4.6 mmol/L (ref 3.5–5.1)
Potassium: 5.1 mmol/L (ref 3.5–5.1)
Potassium: 4.1 mmol/L (ref 3.5–5.1)
Sodium: 136 mmol/L (ref 135–145)
Sodium: 139 mmol/L (ref 135–145)
Sodium: 137 mmol/L (ref 135–145)
Sodium: 138 mmol/L (ref 135–145)
Sodium: 138 mmol/L (ref 135–145)
TCO2: 26 mmol/L (ref 22–32)
TCO2: 25 mmol/L (ref 22–32)
TCO2: 26 mmol/L (ref 22–32)
TCO2: 27 mmol/L (ref 22–32)
TCO2: 23 mmol/L (ref 22–32)

## 2024-07-02 LAB — BASIC METABOLIC PANEL WITH GFR
Anion gap: 9 (ref 5–15)
Anion gap: 5 (ref 5–15)
BUN: 14 mg/dL (ref 8–23)
BUN: 12 mg/dL (ref 8–23)
CO2: 25 mmol/L (ref 22–32)
CO2: 24 mmol/L (ref 22–32)
Calcium: 8.9 mg/dL (ref 8.9–10.3)
Calcium: 8 mg/dL — ABNORMAL LOW (ref 8.9–10.3)
Chloride: 103 mmol/L (ref 98–111)
Chloride: 110 mmol/L (ref 98–111)
Creatinine, Ser: 1.12 mg/dL (ref 0.61–1.24)
Creatinine, Ser: 1.03 mg/dL (ref 0.61–1.24)
GFR, Estimated: >60 mL/min (ref >60)
GFR, Estimated: >60 mL/min (ref >60)
Glucose, Bld: 110 mg/dL — ABNORMAL HIGH (ref 70–99)
Glucose, Bld: 135 mg/dL — ABNORMAL HIGH (ref 70–99)
Potassium: 4.2 mmol/L (ref 3.5–5.1)
Potassium: 4.3 mmol/L (ref 3.5–5.1)
Sodium: 137 mmol/L (ref 135–145)
Sodium: 139 mmol/L (ref 135–145)

## 2024-07-02 LAB — GLUCOSE, CAPILLARY
Glucose-Capillary: 81 mg/dL (ref 70–99)
Glucose-Capillary: 105 mg/dL — ABNORMAL HIGH (ref 70–99)
Glucose-Capillary: 102 mg/dL — ABNORMAL HIGH (ref 70–99)
Glucose-Capillary: 120 mg/dL — ABNORMAL HIGH (ref 70–99)
Glucose-Capillary: 114 mg/dL — ABNORMAL HIGH (ref 70–99)
Glucose-Capillary: 112 mg/dL — ABNORMAL HIGH (ref 70–99)
Glucose-Capillary: 158 mg/dL — ABNORMAL HIGH (ref 70–99)
Glucose-Capillary: 145 mg/dL — ABNORMAL HIGH (ref 70–99)

## 2024-07-02 LAB — PLATELET COUNT: Platelets: 171 K/uL (ref 150–400)

## 2024-07-02 LAB — LIPID PANEL
Cholesterol: 103 mg/dL (ref 0–200)
HDL: 43 mg/dL (ref >40)
LDL Cholesterol: 44 mg/dL (ref 0–99)
Total CHOL/HDL Ratio: 2.4 ratio
Triglycerides: 78 mg/dL (ref <150)
VLDL: 16 mg/dL (ref 0–40)

## 2024-07-02 LAB — MAGNESIUM: Magnesium: 3 mg/dL — ABNORMAL HIGH (ref 1.7–2.4)

## 2024-07-02 LAB — APTT: aPTT: 36 s (ref 24–36)

## 2024-07-02 LAB — HEMOGLOBIN AND HEMATOCRIT, BLOOD
HCT: 29.5 % — ABNORMAL LOW (ref 39.0–52.0)
Hemoglobin: 10.1 g/dL — ABNORMAL LOW (ref 13.0–17.0)

## 2024-07-02 LAB — FIBRINOGEN: Fibrinogen: 334 mg/dL (ref 210–475)

## 2024-07-02 SURGERY — REPLACEMENT, AORTIC VALVE, OPEN
Anesthesia: General | Site: Chest

## 2024-07-02 MED ORDER — MAGNESIUM SULFATE 4 GM/100ML IV SOLN
4.0000 g | Freq: Once | INTRAVENOUS | Status: AC
  Administered 2024-07-02: 4 g via INTRAVENOUS
  Filled 2024-07-02: qty 100

## 2024-07-02 MED ORDER — SODIUM CHLORIDE 0.9% FLUSH
3.0000 mL | Freq: Two times a day (BID) | INTRAVENOUS | Status: DC
  Administered 2024-07-03 – 2024-07-07 (×9): 3 mL via INTRAVENOUS

## 2024-07-02 MED ORDER — ALBUMIN HUMAN 5 % IV SOLN
250.0000 mL | INTRAVENOUS | Status: DC | PRN
  Administered 2024-07-02 (×3): 12.5 g via INTRAVENOUS
  Filled 2024-07-02 (×2): qty 250

## 2024-07-02 MED ORDER — OXYCODONE HCL 5 MG PO TABS
5.0000 mg | ORAL_TABLET | ORAL | Status: DC | PRN
  Administered 2024-07-02: 5 mg via ORAL
  Administered 2024-07-02 – 2024-07-03 (×5): 10 mg via ORAL
  Administered 2024-07-04: 5 mg via ORAL
  Administered 2024-07-04: 10 mg via ORAL
  Filled 2024-07-02 (×2): qty 2
  Filled 2024-07-02: qty 1
  Filled 2024-07-02: qty 2
  Filled 2024-07-02: qty 1
  Filled 2024-07-02 (×3): qty 2

## 2024-07-02 MED ORDER — METOPROLOL TARTRATE 25 MG/10 ML ORAL SUSPENSION: 12.5000 mg | Freq: Two times a day (BID) | ORAL | Status: DC

## 2024-07-02 MED ORDER — HEPARIN SODIUM (PORCINE) 1000 UNIT/ML IJ SOLN
INTRAMUSCULAR | Status: AC
Start: 2024-07-02 — End: 2024-07-02
  Filled 2024-07-02: qty 1

## 2024-07-02 MED ORDER — PROTAMINE SULFATE 10 MG/ML IV SOLN
INTRAVENOUS | Status: DC | PRN
  Administered 2024-07-02: 320 mg via INTRAVENOUS

## 2024-07-02 MED ORDER — SODIUM CHLORIDE 0.9% FLUSH: 3.0000 mL | INTRAVENOUS | Status: DC | PRN

## 2024-07-02 MED ORDER — CHLORHEXIDINE GLUCONATE 0.12 % MT SOLN
15.0000 mL | OROMUCOSAL | Status: AC
  Administered 2024-07-02: 15 mL via OROMUCOSAL
  Filled 2024-07-02: qty 15

## 2024-07-02 MED ORDER — LACTATED RINGERS IV SOLN: INTRAVENOUS | Status: DC | PRN

## 2024-07-02 MED ORDER — SODIUM CHLORIDE 0.9 % IV SOLN
INTRAVENOUS | Status: AC
Start: 2024-07-02 — End: 2024-07-03

## 2024-07-02 MED ORDER — PHENYLEPHRINE 80 MCG/ML (10ML) SYRINGE FOR IV PUSH (FOR BLOOD PRESSURE SUPPORT)
PREFILLED_SYRINGE | INTRAVENOUS | Status: DC | PRN
  Administered 2024-07-02: 80 ug via INTRAVENOUS
  Administered 2024-07-02 (×2): 160 ug via INTRAVENOUS
  Administered 2024-07-02: 80 ug via INTRAVENOUS
  Administered 2024-07-02: 160 ug via INTRAVENOUS
  Administered 2024-07-02: 120 ug via INTRAVENOUS
  Administered 2024-07-02: 160 ug via INTRAVENOUS
  Administered 2024-07-02 (×2): 80 ug via INTRAVENOUS
  Administered 2024-07-02: 160 ug via INTRAVENOUS
  Administered 2024-07-02 (×2): 80 ug via INTRAVENOUS

## 2024-07-02 MED ORDER — SODIUM CHLORIDE 0.45 % IV SOLN: INTRAVENOUS | Status: AC | PRN

## 2024-07-02 MED ORDER — PROPOFOL 500 MG/50ML IV EMUL
INTRAVENOUS | Status: DC | PRN
  Administered 2024-07-02: 50 ug/kg/min via INTRAVENOUS

## 2024-07-02 MED ORDER — METOPROLOL TARTRATE 5 MG/5ML IV SOLN: 2.5000 mg | INTRAVENOUS | Status: DC | PRN

## 2024-07-02 MED ORDER — ROCURONIUM BROMIDE 10 MG/ML (PF) SYRINGE
PREFILLED_SYRINGE | INTRAVENOUS | Status: DC | PRN
  Administered 2024-07-02: 100 mg via INTRAVENOUS
  Administered 2024-07-02: 30 mg via INTRAVENOUS
  Administered 2024-07-02: 100 mg via INTRAVENOUS

## 2024-07-02 MED ORDER — 0.9 % SODIUM CHLORIDE (POUR BTL) OPTIME
TOPICAL | Status: DC | PRN
  Administered 2024-07-02: 5000 mL

## 2024-07-02 MED ORDER — ASPIRIN 81 MG PO CHEW
324.0000 mg | CHEWABLE_TABLET | Freq: Once | ORAL | Status: AC
  Administered 2024-07-02: 324 mg via ORAL
  Filled 2024-07-02: qty 4

## 2024-07-02 MED ORDER — MORPHINE SULFATE (PF) 2 MG/ML IV SOLN
1.0000 mg | INTRAVENOUS | Status: DC | PRN
  Administered 2024-07-02: 4 mg via INTRAVENOUS
  Administered 2024-07-02 – 2024-07-04 (×4): 2 mg via INTRAVENOUS
  Filled 2024-07-02 (×3): qty 1
  Filled 2024-07-02 (×2): qty 2

## 2024-07-02 MED ORDER — CLEVIDIPINE BUTYRATE 0.5 MG/ML IV EMUL: 0.0000 mg/h | INTRAVENOUS | Status: DC

## 2024-07-02 MED ORDER — ACETAMINOPHEN 500 MG PO TABS
1000.0000 mg | ORAL_TABLET | Freq: Four times a day (QID) | ORAL | Status: DC
  Administered 2024-07-03 – 2024-07-07 (×17): 1000 mg via ORAL
  Filled 2024-07-02 (×17): qty 2

## 2024-07-02 MED ORDER — SODIUM BICARBONATE 8.4 % IV SOLN
100.0000 meq | Freq: Once | INTRAVENOUS | Status: AC
  Administered 2024-07-02: 100 meq via INTRAVENOUS

## 2024-07-02 MED ORDER — DEXMEDETOMIDINE HCL IN NACL 400 MCG/100ML IV SOLN
0.0000 ug/kg/h | INTRAVENOUS | Status: DC
  Filled 2024-07-02: qty 100

## 2024-07-02 MED ORDER — ROCURONIUM BROMIDE 10 MG/ML (PF) SYRINGE
PREFILLED_SYRINGE | INTRAVENOUS | Status: AC
  Filled 2024-07-02: qty 10

## 2024-07-02 MED ORDER — ASPIRIN 81 MG PO CHEW
324.0000 mg | CHEWABLE_TABLET | Freq: Every day | ORAL | Status: DC
  Administered 2024-07-04: 324 mg
  Filled 2024-07-02: qty 4

## 2024-07-02 MED ORDER — MIDAZOLAM HCL (PF) 5 MG/ML IJ SOLN
INTRAMUSCULAR | Status: DC | PRN
  Administered 2024-07-02: 1 mg via INTRAVENOUS
  Administered 2024-07-02: 2 mg via INTRAVENOUS

## 2024-07-02 MED ORDER — LIDOCAINE 2% (20 MG/ML) 5 ML SYRINGE
INTRAMUSCULAR | Status: DC | PRN
  Administered 2024-07-02 (×2): 100 mg via INTRAVENOUS

## 2024-07-02 MED ORDER — PANTOPRAZOLE SODIUM 40 MG PO TBEC
40.0000 mg | DELAYED_RELEASE_TABLET | Freq: Every day | ORAL | Status: DC
  Administered 2024-07-04 – 2024-07-07 (×4): 40 mg via ORAL
  Filled 2024-07-02 (×4): qty 1

## 2024-07-02 MED ORDER — SODIUM CHLORIDE 0.9 % IV SOLN: 250.0000 mL | INTRAVENOUS | Status: AC

## 2024-07-02 MED ORDER — FENTANYL CITRATE (PF) 250 MCG/5ML IJ SOLN
INTRAMUSCULAR | Status: AC
  Filled 2024-07-02: qty 5

## 2024-07-02 MED ORDER — PROTAMINE SULFATE 10 MG/ML IV SOLN
INTRAVENOUS | Status: AC
  Filled 2024-07-02: qty 25

## 2024-07-02 MED ORDER — LACTATED RINGERS IV SOLN
INTRAVENOUS | Status: AC
Start: 2024-07-02 — End: 2024-07-03

## 2024-07-02 MED ORDER — METOPROLOL TARTRATE 12.5 MG HALF TABLET
12.5000 mg | ORAL_TABLET | Freq: Two times a day (BID) | ORAL | Status: DC
  Administered 2024-07-03 – 2024-07-05 (×4): 12.5 mg via ORAL
  Filled 2024-07-02 (×4): qty 1

## 2024-07-02 MED ORDER — BISACODYL 10 MG RE SUPP: 10.0000 mg | Freq: Every day | RECTAL | Status: DC

## 2024-07-02 MED ORDER — DOCUSATE SODIUM 100 MG PO CAPS
200.0000 mg | ORAL_CAPSULE | Freq: Every day | ORAL | Status: DC
Start: 2024-07-03 — End: 2024-07-06
  Administered 2024-07-03 – 2024-07-05 (×3): 200 mg via ORAL
  Filled 2024-07-02 (×3): qty 2

## 2024-07-02 MED ORDER — SODIUM CHLORIDE (PF) 0.9 % IJ SOLN: OROMUCOSAL | Status: DC | PRN

## 2024-07-02 MED ORDER — PANTOPRAZOLE SODIUM 40 MG IV SOLR
40.0000 mg | Freq: Every day | INTRAVENOUS | Status: AC
  Administered 2024-07-02 – 2024-07-03 (×2): 40 mg via INTRAVENOUS
  Filled 2024-07-02 (×2): qty 10

## 2024-07-02 MED ORDER — ACETAMINOPHEN 160 MG/5ML PO SOLN: 1000.0000 mg | Freq: Four times a day (QID) | ORAL | Status: DC

## 2024-07-02 MED ORDER — DEXMEDETOMIDINE HCL IN NACL 400 MCG/100ML IV SOLN
INTRAVENOUS | Status: AC
  Administered 2024-07-02: 0.7 ug/kg/h via INTRAVENOUS
  Filled 2024-07-02: qty 100

## 2024-07-02 MED ORDER — PHENYLEPHRINE HCL-NACL 20-0.9 MG/250ML-% IV SOLN
0.0000 ug/min | INTRAVENOUS | Status: DC
  Administered 2024-07-03: 70 ug/min via INTRAVENOUS
  Filled 2024-07-02: qty 250

## 2024-07-02 MED ORDER — POTASSIUM CHLORIDE 10 MEQ/50ML IV SOLN
10.0000 meq | INTRAVENOUS | Status: AC
  Administered 2024-07-02 (×3): 10 meq via INTRAVENOUS

## 2024-07-02 MED ORDER — LIDOCAINE 2% (20 MG/ML) 5 ML SYRINGE
INTRAMUSCULAR | Status: AC
  Filled 2024-07-02: qty 5

## 2024-07-02 MED ORDER — PLASMA-LYTE A IV SOLN: INTRAVENOUS | Status: DC | PRN

## 2024-07-02 MED ORDER — POLYETHYLENE GLYCOL 3350 17 G PO PACK: 17.0000 g | PACK | Freq: Every day | ORAL | Status: DC | PRN

## 2024-07-02 MED ORDER — METHADONE HCL IV SYRINGE 10 MG/ML FOR CABG
0.2000 mg/kg | Freq: Once | INTRAMUSCULAR | Status: AC
  Administered 2024-07-02: 14 mg via INTRAVENOUS
  Filled 2024-07-02: qty 1.4

## 2024-07-02 MED ORDER — AMIODARONE IV BOLUS ONLY 150 MG/100ML
INTRAVENOUS | Status: DC | PRN
  Administered 2024-07-02: 60 mg via INTRAVENOUS

## 2024-07-02 MED ORDER — DEXTROSE 50 % IV SOLN: 0.0000 mL | INTRAVENOUS | Status: DC | PRN

## 2024-07-02 MED ORDER — BISACODYL 5 MG PO TBEC
10.0000 mg | DELAYED_RELEASE_TABLET | Freq: Every day | ORAL | Status: DC
  Administered 2024-07-03 – 2024-07-05 (×3): 10 mg via ORAL
  Filled 2024-07-02 (×3): qty 2

## 2024-07-02 MED ORDER — METOCLOPRAMIDE HCL 5 MG/ML IJ SOLN
10.0000 mg | Freq: Four times a day (QID) | INTRAMUSCULAR | Status: AC
  Administered 2024-07-02 – 2024-07-03 (×6): 10 mg via INTRAVENOUS
  Filled 2024-07-02 (×6): qty 2

## 2024-07-02 MED ORDER — PROPOFOL 10 MG/ML IV BOLUS
INTRAVENOUS | Status: AC
  Filled 2024-07-02: qty 20

## 2024-07-02 MED ORDER — CEFAZOLIN SODIUM-DEXTROSE 2-4 GM/100ML-% IV SOLN
2.0000 g | Freq: Three times a day (TID) | INTRAVENOUS | Status: AC
  Administered 2024-07-02 – 2024-07-04 (×6): 2 g via INTRAVENOUS
  Filled 2024-07-02 (×6): qty 100

## 2024-07-02 MED ORDER — PROPOFOL 10 MG/ML IV BOLUS
INTRAVENOUS | Status: DC | PRN
  Administered 2024-07-02: 100 mg via INTRAVENOUS

## 2024-07-02 MED ORDER — INSULIN REGULAR(HUMAN) IN NACL 100-0.9 UT/100ML-% IV SOLN: INTRAVENOUS | Status: DC

## 2024-07-02 MED ORDER — SODIUM CHLORIDE 0.9 % IV SOLN: INTRAVENOUS | Status: DC | PRN

## 2024-07-02 MED ORDER — MIDAZOLAM HCL 2 MG/2ML IJ SOLN: 2.0000 mg | INTRAMUSCULAR | Status: DC | PRN

## 2024-07-02 MED ORDER — VANCOMYCIN HCL IN DEXTROSE 1-5 GM/200ML-% IV SOLN
1000.0000 mg | Freq: Once | INTRAVENOUS | Status: AC
  Administered 2024-07-02: 1000 mg via INTRAVENOUS
  Filled 2024-07-02: qty 200

## 2024-07-02 MED ORDER — CHLORHEXIDINE GLUCONATE CLOTH 2 % EX PADS
6.0000 | MEDICATED_PAD | Freq: Every day | CUTANEOUS | Status: DC
  Administered 2024-07-02 – 2024-07-06 (×4): 6 via TOPICAL

## 2024-07-02 MED ORDER — PROPOFOL 10 MG/ML IV BOLUS
INTRAVENOUS | Status: AC
Start: 2024-07-02 — End: 2024-07-02
  Filled 2024-07-02: qty 20

## 2024-07-02 MED ORDER — CALCIUM CHLORIDE 10 % IV SOLN
INTRAVENOUS | Status: DC | PRN
Start: 2024-07-02 — End: 2024-07-02
  Administered 2024-07-02 (×4): 100 mg via INTRAVENOUS

## 2024-07-02 MED ORDER — HEPARIN SODIUM (PORCINE) 1000 UNIT/ML IJ SOLN
INTRAMUSCULAR | Status: DC | PRN
Start: 2024-07-02 — End: 2024-07-02
  Administered 2024-07-02: 34000 [IU] via INTRAVENOUS

## 2024-07-02 MED ORDER — TRAMADOL HCL 50 MG PO TABS
50.0000 mg | ORAL_TABLET | ORAL | Status: DC | PRN
  Administered 2024-07-04 – 2024-07-06 (×3): 100 mg via ORAL
  Filled 2024-07-02 (×3): qty 2

## 2024-07-02 MED ORDER — ACETAMINOPHEN 160 MG/5ML PO SOLN
650.0000 mg | Freq: Once | ORAL | Status: AC
  Administered 2024-07-02: 650 mg
  Filled 2024-07-02: qty 20.3

## 2024-07-02 MED ORDER — ONDANSETRON HCL 4 MG/2ML IJ SOLN: 4.0000 mg | Freq: Four times a day (QID) | INTRAMUSCULAR | Status: DC | PRN

## 2024-07-02 MED ORDER — MIDAZOLAM HCL (PF) 10 MG/2ML IJ SOLN
INTRAMUSCULAR | Status: AC
  Filled 2024-07-02: qty 2

## 2024-07-02 MED ORDER — SOD CITRATE-CITRIC ACID 500-334 MG/5ML PO SOLN
ORAL | Status: AC
  Filled 2024-07-02: qty 30

## 2024-07-02 MED ORDER — ASPIRIN 325 MG PO TBEC
325.0000 mg | DELAYED_RELEASE_TABLET | Freq: Every day | ORAL | Status: DC
  Administered 2024-07-03 – 2024-07-07 (×4): 325 mg via ORAL
  Filled 2024-07-02 (×5): qty 1

## 2024-07-02 MED ORDER — FENTANYL CITRATE (PF) 250 MCG/5ML IJ SOLN
INTRAMUSCULAR | Status: DC | PRN
  Administered 2024-07-02: 200 ug via INTRAVENOUS
  Administered 2024-07-02 (×3): 50 ug via INTRAVENOUS

## 2024-07-02 SURGICAL SUPPLY — 94 items
ADAPTER MULTI PERFUSION 15 (ADAPTER) ×3 IMPLANT
BAG DECANTER FOR FLEXI CONT (MISCELLANEOUS) ×3 IMPLANT
BLADE CLIPPER SURG (BLADE) ×3 IMPLANT
BLADE STERNUM SYSTEM 6 (BLADE) ×3 IMPLANT
BLADE SURG 11 STRL SS (BLADE) IMPLANT
BLADE SURG 15 STRL LF DISP TIS (BLADE) ×3 IMPLANT
BNDG ELASTIC 4INX 5YD STR LF (GAUZE/BANDAGES/DRESSINGS) IMPLANT
BNDG ELASTIC 4X5.8 VLCR STR LF (GAUZE/BANDAGES/DRESSINGS) ×3 IMPLANT
BNDG ELASTIC 6INX 5YD STR LF (GAUZE/BANDAGES/DRESSINGS) ×3 IMPLANT
BNDG GAUZE DERMACEA FLUFF 4 (GAUZE/BANDAGES/DRESSINGS) ×3 IMPLANT
CANISTER SUCTION 3000ML PPV (SUCTIONS) ×3 IMPLANT
CANNULA AORTIC ROOT 9FR (CANNULA) ×3 IMPLANT
CANNULA GUNDRY RCSP 15FR (MISCELLANEOUS) ×3 IMPLANT
CANNULA MC2 2 STG 29/37 NON-V (CANNULA) ×3 IMPLANT
CANNULA NON VENT 20FR 12 (CANNULA) ×3 IMPLANT
CANNULA SUMP PERICARDIAL (CANNULA) IMPLANT
CATH HEART VENT LEFT (CATHETERS) ×3 IMPLANT
CATH ROBINSON RED A/P 18FR (CATHETERS) ×6 IMPLANT
CLIP RETRACTION 3.0MM CORONARY (MISCELLANEOUS) IMPLANT
CLIP TI MEDIUM 24 (CLIP) IMPLANT
CLIP TI WIDE RED SMALL 24 (CLIP) IMPLANT
CNTNR URN SCR LID CUP LEK RST (MISCELLANEOUS) ×3 IMPLANT
CONNECTOR BLAKE 2:1 CARIO BLK (MISCELLANEOUS) ×3 IMPLANT
CONTAINER PROTECT SURGISLUSH (MISCELLANEOUS) ×6 IMPLANT
COUNTER NDL 20CT MAGNET RED (NEEDLE) IMPLANT
COVER SURGICAL LIGHT HANDLE (MISCELLANEOUS) ×3 IMPLANT
DERMABOND ADVANCED .7 DNX12 (GAUZE/BANDAGES/DRESSINGS) IMPLANT
DEVICE SUT CK QUICK LOAD INDV (Prosthesis & Implant Heart) IMPLANT
DEVICE SUT CK QUICK LOAD MINI (Prosthesis & Implant Heart) IMPLANT
DRAIN CHANNEL 19F RND (DRAIN) ×9 IMPLANT
DRAIN CONNECTOR BLAKE 1:1 (MISCELLANEOUS) IMPLANT
DRAPE SRG 135X102X78XABS (DRAPES) ×3 IMPLANT
DRAPE WARM FLUID 44X44 (DRAPES) ×3 IMPLANT
DRSG AQUACEL AG ADV 3.5X10 (GAUZE/BANDAGES/DRESSINGS) ×3 IMPLANT
ELECTRODE BLDE 4.0 EZ CLN MEGD (MISCELLANEOUS) ×3 IMPLANT
ELECTRODE REM PT RTRN 9FT ADLT (ELECTROSURGICAL) ×6 IMPLANT
FELT TEFLON 1X6 (MISCELLANEOUS) ×6 IMPLANT
GAUZE SPONGE 4X4 12PLY STRL (GAUZE/BANDAGES/DRESSINGS) ×6 IMPLANT
GAUZE SPONGE 4X4 12PLY STRL LF (GAUZE/BANDAGES/DRESSINGS) IMPLANT
GLOVE BIO SURGEON STRL SZ 6 (GLOVE) IMPLANT
GLOVE BIO SURGEON STRL SZ 6.5 (GLOVE) IMPLANT
GLOVE BIO SURGEON STRL SZ7 (GLOVE) IMPLANT
GLOVE BIO SURGEON STRL SZ7.5 (GLOVE) ×6 IMPLANT
GLOVE BIOGEL M STRL SZ7.5 (GLOVE) IMPLANT
GOWN STRL REUS W/ TWL LRG LVL3 (GOWN DISPOSABLE) ×12 IMPLANT
GOWN STRL REUS W/ TWL XL LVL3 (GOWN DISPOSABLE) ×6 IMPLANT
GOWN STRL SURGICAL XL XLNG (GOWN DISPOSABLE) ×6 IMPLANT
HEMOSTAT POWDER SURGIFOAM 1G (HEMOSTASIS) ×6 IMPLANT
HEMOSTAT SURGICEL 2X14 (HEMOSTASIS) IMPLANT
INSERT SUTURE HOLDER (MISCELLANEOUS) ×6 IMPLANT
KIT BASIN OR (CUSTOM PROCEDURE TRAY) ×3 IMPLANT
KIT SUT CK MINI COMBO 4X17 (Prosthesis & Implant Heart) IMPLANT
KIT TURNOVER KIT B (KITS) ×3 IMPLANT
KIT VASOVIEW HEMOPRO 2 VH 4000 (KITS) ×3 IMPLANT
LEAD PACING MYOCARDI (MISCELLANEOUS) ×3 IMPLANT
LINE VENT (MISCELLANEOUS) IMPLANT
MARKER DISTAL GRAFT W/ HOLDER (MISCELLANEOUS) ×9 IMPLANT
PACK E OPEN HEART (SUTURE) ×3 IMPLANT
PACK OPEN HEART (CUSTOM PROCEDURE TRAY) ×3 IMPLANT
PAD ARMBOARD POSITIONER FOAM (MISCELLANEOUS) ×6 IMPLANT
PAD ELECT DEFIB RADIOL ZOLL (MISCELLANEOUS) ×3 IMPLANT
PENCIL BUTTON HOLSTER BLD 10FT (ELECTRODE) ×3 IMPLANT
POSITIONER HEAD DONUT 9IN (MISCELLANEOUS) ×3 IMPLANT
PUNCH AORTIC ROTATE 4.0MM (MISCELLANEOUS) ×3 IMPLANT
SET MPS 3-ND DEL (MISCELLANEOUS) IMPLANT
SOLN 0.9% NACL 1000 ML (IV SOLUTION) ×10 IMPLANT
SOLN 0.9% NACL POUR BTL 1000ML (IV SOLUTION) ×15 IMPLANT
SOLN STERILE WATER 1000 ML (IV SOLUTION) ×4 IMPLANT
SOLN STERILE WATER BTL 1000 ML (IV SOLUTION) ×6 IMPLANT
SUPPORT HEART JANKE-BARRON (MISCELLANEOUS) ×3 IMPLANT
SUT EB EXC GRN/WHT 2-0 V-5 (SUTURE) ×6 IMPLANT
SUT ETHIBOND X763 2 0 SH 1 (SUTURE) ×6 IMPLANT
SUT MNCRL AB 3-0 PS2 18 (SUTURE) ×6 IMPLANT
SUT PDS AB 1 CTX 36 (SUTURE) ×6 IMPLANT
SUT PROLENE 3 0 SH DA (SUTURE) IMPLANT
SUT PROLENE 4 0 SH DA (SUTURE) ×3 IMPLANT
SUT PROLENE 4-0 RB1 .5 CRCL 36 (SUTURE) ×9 IMPLANT
SUT PROLENE 5 0 C 1 36 (SUTURE) ×9 IMPLANT
SUT PROLENE 7 0 BV 1 (SUTURE) IMPLANT
SUT PROLENE 7 0 BV1 MDA (SUTURE) ×3 IMPLANT
SUT STEEL 6MS V (SUTURE) ×6 IMPLANT
SUT STEEL STERNAL CCS#1 18IN (SUTURE) IMPLANT
SUT VIC AB 2-0 CT1 TAPERPNT 27 (SUTURE) IMPLANT
SYSTEM SAHARA CHEST DRAIN ATS (WOUND CARE) ×3 IMPLANT
TAPE CLOTH SURG 4X10 WHT LF (GAUZE/BANDAGES/DRESSINGS) IMPLANT
TAPE PAPER 2X10 WHT MICROPORE (GAUZE/BANDAGES/DRESSINGS) IMPLANT
TOWEL GREEN STERILE (TOWEL DISPOSABLE) ×3 IMPLANT
TOWEL GREEN STERILE FF (TOWEL DISPOSABLE) ×3 IMPLANT
TRAY FOLEY SLVR 16FR TEMP STAT (SET/KITS/TRAYS/PACK) ×3 IMPLANT
TUBE SUCT INTRACARD DLP 20F (MISCELLANEOUS) ×3 IMPLANT
TUBE SUCTION CARDIAC 10FR (CANNULA) ×3 IMPLANT
TUBING LAP HI FLOW INSUFFLATIO (TUBING) ×3 IMPLANT
UNDERPAD 30X36 HEAVY ABSORB (UNDERPADS AND DIAPERS) ×3 IMPLANT
VALVE AORTIC SZ25 INSP/RESIL (Prosthesis & Implant Heart) IMPLANT

## 2024-07-02 NOTE — Anesthesia Procedure Notes (Addendum)
 Arterial Line Insertion Start/End10/03/2024 6:59 AM, 07/02/2024 7:06 AM Performed by: Waddell Lauraine NOVAK, MD, Elby Raelene SAUNDERS, CRNA, CRNA  Patient location: Pre-op. Preanesthetic checklist: patient identified, IV checked, site marked, risks and benefits discussed, surgical consent, monitors and equipment checked, pre-op evaluation, timeout performed and anesthesia consent Lidocaine  1% used for infiltration Left, radial was placed Catheter size: 20 G Hand hygiene performed  Allen's test indicative of satisfactory collateral circulation Attempts: 1 Following insertion, dressing applied and Biopatch. Post procedure assessment: normal  Patient tolerated the procedure well with no immediate complications.

## 2024-07-02 NOTE — Progress Notes (Signed)
 Advanced Heart Failure Rounding Note  Cardiologist: None  Chief Complaint:  CABG/ AVR Subjective:   S/P CABG x2 AVR developed SVT. Started on Amio drip   Arrived intubated/sedated On low dose Neo  Flotrak CO 4.5 CI 2.1 SVR 1091    Objective:   Weight Range: 103.7 kg Body mass index is 33.76 kg/m.   Vital Signs:   Temp:  [97.7 F (36.5 C)-98 F (36.7 C)] 97.9 F (36.6 C) (10/07 9361) Pulse Rate:  [67-92] 87 (10/07 0707) Resp:  [13-27] 15 (10/07 0715) BP: (86-145)/(55-104) 124/86 (10/07 0651) SpO2:  [95 %-98 %] 98 % (10/07 0707) Weight:  [103.7 kg] 103.7 kg (10/07 0638) Last BM Date :  (PTA)  Weight change: Filed Weights   07/01/24 1522 07/01/24 2000 07/02/24 9361  Weight: 103.7 kg 103.7 kg 103.7 kg    Intake/Output:   Intake/Output Summary (Last 24 hours) at 07/02/2024 1355 Last data filed at 07/02/2024 1340 Gross per 24 hour  Intake 3620 ml  Output 3975 ml  Net -355 ml      Physical Exam  General:  Intubated Neck: Difficult to assess Cor: Regular rate & rhythm.  Lungs: clear Abdomen: soft, nontender, nondistended.  Extremities: no  edema Neuro: Intubated/sedated  Telemetry  SR   EKG   na Labs    CBC Recent Labs    07/01/24 0744 07/01/24 1030 07/02/24 0437 07/02/24 0816 07/02/24 1145 07/02/24 1151 07/02/24 1302 07/02/24 1306  WBC 7.7  --  7.5  --   --   --   --   --   HGB 15.3   < > 14.4   < > 10.1*   < > 9.2* 10.2*  HCT 44.7   < > 42.6   < > 29.5*   < > 27.0* 30.0*  MCV 94.1  --  93.6  --   --   --   --   --   PLT 261  --  237  --  171  --   --   --    < > = values in this interval not displayed.   Basic Metabolic Panel Recent Labs    89/93/74 1900 07/02/24 0437 07/02/24 0816 07/02/24 1151 07/02/24 1225 07/02/24 1302 07/02/24 1306  NA 136 137   < > 138   < > 138 138  K 4.5 4.2   < > 5.1   < > 4.1 4.1  CL 101 103   < > 104  --   --  102  CO2 25 25  --   --   --   --   --   GLUCOSE 177* 110*   < > 118*  --   --  177*   BUN 15 14   < > 14  --   --  14  CREATININE 1.40* 1.12   < > 0.90  --   --  0.90  CALCIUM  8.9 8.9  --   --   --   --   --    < > = values in this interval not displayed.   Liver Function Tests Recent Labs    07/01/24 1900  AST 23  ALT 26  ALKPHOS 69  BILITOT 0.7  PROT 6.2*  ALBUMIN 3.6   No results for input(s): LIPASE, AMYLASE in the last 72 hours. Cardiac Enzymes No results for input(s): CKTOTAL, CKMB, CKMBINDEX, TROPONINI in the last 72 hours.  BNP: BNP (last 3 results) Recent Labs  07/01/24 1403  BNP 19.4    ProBNP (last 3 results) No results for input(s): PROBNP in the last 8760 hours.   D-Dimer No results for input(s): DDIMER in the last 72 hours. Hemoglobin A1C Recent Labs    07/01/24 1900  HGBA1C 5.4   Fasting Lipid Panel Recent Labs    07/02/24 0437  CHOL 103  HDL 43  LDLCALC 44  TRIG 78  CHOLHDL 2.4   Thyroid Function Tests No results for input(s): TSH, T4TOTAL, T3FREE, THYROIDAB in the last 72 hours.  Invalid input(s): FREET3  Other results:   Imaging    DG Chest Port 1 View Result Date: 07/01/2024 CLINICAL DATA:  Preop for liked of surgery. EXAM: PORTABLE CHEST 1 VIEW COMPARISON:  08/13/2021 FINDINGS: The cardiac silhouette, mediastinal and hilar contours are within normal limits. The lungs are clear. No pleural effusion. No pulmonary lesions or pneumothorax. The bony thorax is intact. IMPRESSION: No acute cardiopulmonary findings. Electronically Signed   By: MYRTIS Stammer M.D.   On: 07/01/2024 19:16     Medications:     Scheduled Medications:  [MAR Hold] aspirin   81 mg Oral Daily   [MAR Hold] atorvastatin   40 mg Oral Daily   Chlorhexidine  Gluconate Cloth  6 each Topical Once   epinephrine  0-10 mcg/min Intravenous To OR   [MAR Hold] gabapentin   400 mg Oral QHS   heparin  sodium (porcine) 2,500 Units, papaverine 30 mg in electrolyte-A (PLASMALYTE-A PH 7.4) 500 mL irrigation   Irrigation To OR   [MAR  Hold] hydrALAZINE   25 mg Oral Q8H   [facility] Blood Cardioplegia vial (lidocaine /magnesium /mannitol 0.26g-4g-6.4g)   Intracoronary To OR   [MAR Hold] multivitamin with minerals  1 tablet Oral Daily   [MAR Hold] mupirocin ointment  1 Application Nasal BID   potassium chloride   80 mEq Other To OR   [MAR Hold] sodium chloride  flush  3 mL Intravenous Q12H   sodium citrate-citric acid       [MAR Hold] tiZANidine   2 mg Oral QHS   tranexamic acid  2 mg/kg Intracatheter To OR    Infusions:  heparin  30,000 units/NS 1000 mL solution for CELLSAVER     milrinone     nitroGLYCERIN      norepinephrine      PRN Medications: 0.9 % irrigation (POUR BTL), [MAR Hold] acetaminophen , heparin  sodium (porcine) 2,500 Units, papaverine 30 mg in electrolyte-A (PLASMALYTE-A PH 7.4) 500 mL irrigation, [MAR Hold] ondansetron  (ZOFRAN ) IV, [MAR Hold] sodium chloride  flush, sodium citrate-citric acid, Surgifoam 1 Gm with 0.9% sodium chloride  (4 ml) topical solution    Patient Profile  63 y.o. male with severe aortic stenosis, HTN, DB, OSA and CAD. Admitted following right heart catheterization. Right heart catheterization showed normal cardiac index, RV function by pulmonary artery pulsatility index, and echocardiogram showing normal RV size and function. Appears well optimized from cardiac standpoint.   Scheduled CABG AVR   Assessment/Plan   1. S/P CABG x2 AVR  Preop RHC-stable hemodynamics.  Preop Echo -LVEF 45-50% RV normal . Severe AS Plan to wean pressors in stepwise manner Diurese 24-48 hours   2. SVT Placed  on amio drip intraoperatively   HF Team will continue to follow while in the ICU Length of Stay: 1  Jerek Meulemans, NP  07/02/2024, 1:55 PM  Advanced Heart Failure Team Pager [phone] (M-F; 7a - 5p)  Please contact [facility] for night-coverage after hours (5p -7a ) and weekends on [email or web address]

## 2024-07-02 NOTE — Progress Notes (Signed)
 RT at bedside to receive patient from OR.

## 2024-07-02 NOTE — Progress Notes (Signed)
     [address]       [place] [address]             [phone]       No events Vitals:   07/02/24 0600 07/02/24 0638  BP: 120/73 (!) 145/82  Pulse: 80 82  Resp: 16 18  Temp:  97.9 F (36.6 C)  SpO2: 96% 96%   Alert NAD Sinue EWOB  OR today for AVR  CABG

## 2024-07-02 NOTE — Anesthesia Procedure Notes (Signed)
 Central Venous Catheter Insertion Performed by: Waddell Lauraine NOVAK, MD, anesthesiologist Start/End10/03/2024 7:58 AM, 07/02/2024 7:58 AM Patient location: OR. Preanesthetic checklist: patient identified, IV checked, site marked, risks and benefits discussed, surgical consent, monitors and equipment checked, pre-op evaluation, timeout performed and anesthesia consent Position: Trendelenburg Hand hygiene performed  and maximum sterile barriers used  Catheter size: 9 Fr Total catheter length 11. Central line and PA cath was placed.MAC introducer Swan type:thermodilution Procedure performed using ultrasound to evaluate access site. Ultrasound Notes:relevant anatomy identified, ultrasound used to visualize needle entry, vessel patent under ultrasound and image(s) printed for medical record. Attempts: 1 Following insertion, line sutured and dressing applied. Post procedure assessment: blood return through all ports, free fluid flow and no air  Patient tolerated the procedure well with no immediate complications. Additional procedure comments: R IJV Mac Introducer placed under general anesthesia due to patient's full stomach/inability to sedate in holding. Procedure performed as documented above.SABRA

## 2024-07-02 NOTE — Anesthesia Postprocedure Evaluation (Signed)
 Anesthesia Post Note  Patient: Tim Cook  Procedure(s) Performed: REPLACEMENT, AORTIC VALVE, OPEN USING INSPIRIS (Chest) CORONARY ARTERY BYPASS GRAFTING (CABG) X2 USING ENDOSCOPICALLY HARVESTED RIGHT GREATER SAPHENOUS VEIN (Chest) ECHOCARDIOGRAM, TRANSESOPHAGEAL, INTRAOPERATIVE     Patient location during evaluation: ICU Anesthesia Type: General Level of consciousness: sedated and patient remains intubated per anesthesia plan Vital Signs Assessment: post-procedure vital signs reviewed and stable Respiratory status: patient on ventilator - see flowsheet for VS Cardiovascular status: blood pressure returned to baseline Postop Assessment: no apparent nausea or vomiting Anesthetic complications: no Comments: Uneventful transport from OR to CVICU. Remains on Precedex and Propofol  for sedation. No additional vasopressors needed. VSS on arrival. Handoff given to CVICU team at bedside. Reversed with Sugammadex  on arrival.    No notable events documented.  Last Vitals:  Vitals:   07/02/24 0715 07/02/24 1400  BP:    Pulse:    Resp: 15   Temp:    SpO2:  99%    Last Pain:  Vitals:   07/02/24 0718  TempSrc:   PainSc: 0-No pain                 Lauraine KATHEE [place]

## 2024-07-02 NOTE — Op Note (Signed)
 [address]       [place] [address]             [phone]                                          07/02/2024 Patient:  [name] Pre-Op Dx: aortic stenosis Coronary artery diseas   Post-op Dx:  same Procedure: CABG X 2. RSVG OM2 and 3   Aortic valve replacement with a 25mm inspiris valve  Endoscopic greater saphenous vein harvest on the right   Surgeon and Role:      * Murry Khiev, Linnie KIDD, MD - Primary    DEWAINE MICAEL Cera, PA-C - assisting An experienced assistant was required given the complexity of this surgery and the standard of surgical care. The assistant was needed for exposure, dissection, suctioning, retraction of delicate tissues and sutures, instrument exchange and for overall help during this procedure.    Anesthesia  general EBL:  Blood Administration: none Xclamp Time:  130 min Pump Time:   Drains: 48 F blake drain: mediastinal X 2 Wires: V Counts: correct   Indications: 63 y.o. male with with severe aortic stenosis by cath, OM and LPDA disease, and LV dysfunction.  MRI and RHC reviewed.  AVR CABG2.  Of note, due to his lap band, a TEE may be challenging.     Findings: Tricuspid valve.  Calcified targets.  Good vein  Operative Technique: All invasive lines were placed in pre-op holding.  After the risks, benefits and alternatives were thoroughly discussed, the patient was brought to the operative theatre.  Anesthesia was induced, and the patient was prepped and draped in normal sterile fashion.  An appropriate surgical pause was performed, and pre-operative antibiotics were dosed accordingly.  We began with simultaneous incisions along the right leg for harvesting of the greater saphenous vein and the chest for the sternotomy.  In regards to the sternotomy, this was carried down with bovie cautery, and the sternum was divided with a reciprocating saw.  Meticulous hemostasis was obtained.    The sternal retractor was  placed.  The pericardium was divided in the midline and fashioned into a cradle with pericardial stitches.   After we confirmed an appropriate ACT, the ascending aorta was cannulated in standard fashion.  The right atrial appendage was used for venous cannulation site.  Cardiopulmonary bypass was initiated, and the heart retractor was placed. The cross clamp was applied.  Next we exposed the lateral wall, and found a good target on the OM2 and 3.  An end to side anastomosis with the vein grafts were then created to each.    Another dose of anterograde cardioplegia was given with good arrest of the heart.  Our aortotomy was made and directed toward the non coronary cusp.  The valve was inspected.  All leaflets were excised.  The annulus was sized to a 25mm.  The left ventricle was then copiously irrigated.  Pledgeted mattress sutures were placed circumferentially through the annulus.  These sutures were then passed through the sewing ring of the valve.  Once the valve was seated in the annulus, it was secured with Core-knot sutures.  We began to rewarm, and close our aortotomy in 2 layers.  We created an end to side anastomosis between it and the proximal vein grafts.  Rings were  placed on the proximal anastomosis.  A re-animation dose of cardioplegia was given.  After de-airing the heart, the aortic cross clamp was removed.  We checked our valve function, and for air using the TEE.    Hemostasis was obtained, and we separated from cardiopulmonary bypass without event.  The heparin  was reversed with protamine.  Chest tubes and wires were placed, and the sternum was re-approximated with sternal wires.  The soft tissue and skin were re-approximated wth absorbable suture.    The patient tolerated the procedure without any immediate complications, and was transferred to the ICU in guarded condition.  Dovie Kapusta [name]

## 2024-07-02 NOTE — Progress Notes (Signed)
 Echocardiogram Echocardiogram Transesophageal has been performed.  Tim Cook 07/02/2024, 5:52 PM

## 2024-07-02 NOTE — Progress Notes (Signed)
 Patient ID: Tim Cook, male   DOB: [birth year], 62 y.o.   MRN: [id number]   TCTS Evening Rounds:   Hemodynamically stable  CI = 2.3  Has started to wake up on vent.  Urine output good  CT output low  CBC    Component Value Date/Time   WBC 13.9 (H) 07/02/2024 1425   RBC 4.06 (L) 07/02/2024 1425   HGB 12.9 (L) 07/02/2024 1425   HGB 11.2 (L) 07/02/2024 1425   HCT 38.0 (L) 07/02/2024 1425   HCT 33.0 (L) 07/02/2024 1425   PLT 165 07/02/2024 1425   MCV 93.6 07/02/2024 1425   MCH 31.8 07/02/2024 1425   MCHC 33.9 07/02/2024 1425   RDW 12.0 07/02/2024 1425   LYMPHSABS 1.5 01/05/2009 0015   MONOABS 0.7 01/05/2009 0015   EOSABS 0.1 01/05/2009 0015   BASOSABS 0.0 01/05/2009 0015     BMET    Component Value Date/Time   NA 143 07/02/2024 1425   K 3.3 (L) 07/02/2024 1425   CL 102 07/02/2024 1306   CO2 25 07/02/2024 0437   GLUCOSE 177 (H) 07/02/2024 1306   BUN 14 07/02/2024 1306   CREATININE 0.90 07/02/2024 1306   CALCIUM  8.9 07/02/2024 0437   GFRNONAA >60 07/02/2024 0437     A/P:  Stable postop course. Continue current plans. Wean to extubate.

## 2024-07-02 NOTE — Progress Notes (Signed)
 Post-Cardiothoracic Surgery Insulin  Management  Tim Cook is a 63 y.o. male scheduled for a coronary artery bypass graft and surgical aortic valve replacement on 07/02/24  with Dr. Shyrl. Patient has a history of type 2 diabetes mellitus. Pharmacy will be available to assist with transition off of the insulin  infusion.   Lab Results  Component Value Date   HGBA1C 5.4 07/01/2024   HGBA1C 6.9 (H) 03/16/2020   Morna Breach, PharmD PGY2 Cardiology Pharmacy Resident 07/02/2024 2:39 PM

## 2024-07-02 NOTE — Anesthesia Preprocedure Evaluation (Addendum)
 Anesthesia Evaluation  Patient identified by MRN, date of birth, ID band Patient awake    Reviewed: Allergy & Precautions, NPO status , Patient's Chart, lab work & pertinent test results  History of Anesthesia Complications Negative for: history of anesthetic complications  Airway   TM Distance: >3 FB Neck ROM: Full   Comment: Beard Dental  (+) Teeth Intact, Dental Advisory Given   Pulmonary neg COPD, neg recent URI, former smoker   breath sounds clear to auscultation       Cardiovascular hypertension, + angina  + CAD  + Valvular Problems/Murmurs AS  Rhythm:Regular Rate:Normal     Neuro/Psych neg Seizures    GI/Hepatic ,GERD  Medicated,,S/p Bariatric Surgery   Endo/Other  diabetes, Type 2    Renal/GU      Musculoskeletal  (+) Arthritis ,    Abdominal   Peds  Hematology   Anesthesia Other Findings   Reproductive/Obstetrics                              Anesthesia Physical Anesthesia Plan  ASA: 4  Anesthesia Plan: General   Post-op Pain Management:    Induction: Intravenous and Rapid sequence  PONV Risk Score and Plan: 2 and Treatment may vary due to age or medical condition and Propofol  infusion  Airway Management Planned: Oral ETT  Additional Equipment: Arterial line, CVP, TEE and Ultrasound Guidance Line Placement  Intra-op Plan:   Post-operative Plan: Extubation in OR  Informed Consent:      Dental advisory given  Plan Discussed with: CRNA and Surgeon  Anesthesia Plan Comments: (Patient had a chocolate Ensure at 0430 prior to surgery. Discussed with Dr. Shyrl - benefit of surgery outweighs risk of aspiration per discussion. Discussed risks and benefits with the patient. Plan to proceed with general anesthesia via RSI and pre-induction arterial line. CVC to be placed under general anesthesia due to limitations in sedation/risk of aspiration. )         Anesthesia Quick Evaluation

## 2024-07-02 NOTE — Procedures (Signed)
 Extubation Procedure Note  Patient Details:   Name: Tim Cook DOB: May 12, 1961 MRN: [id number]   Airway Documentation:    Vent end date: 07/02/24 Vent end time: 2021   Evaluation  O2 sats: stable throughout Complications: No apparent complications Patient did tolerate procedure well. Bilateral Breath Sounds: Clear   Yes  Pt extubated to 2L [place] per rapid wean protocol. NIF -30, VC 1.9L, positive cuff leak noted prior to extubation.   [name] 07/02/2024, 8:33 PM

## 2024-07-02 NOTE — Progress Notes (Signed)
 Pt confirmed no allergies to medication, latex or betadine.  Pt confirmed procedure (AVR, CABG).  Pt stated he did NOT want a mechanical valve; proceeding with tissue replacement.  Pt states metal in right shoulder from previous surgery.

## 2024-07-02 NOTE — Plan of Care (Signed)

## 2024-07-02 NOTE — Consult Note (Signed)
 NAME:  Tim Cook, MRN:  [id number], DOB:  June 10, 1961, LOS: 1 ADMISSION DATE:  07/01/2024, CONSULTATION DATE:  10/7 REFERRING MD:  Shyrl MA CHIEF COMPLAINT:  s/p AVR, CABG   History of Present Illness:  63 year old male with past medical history of arthritis, chronic back pain, type 2 diabetes, GERD, chronic bronchitis, hypertension, hyperlipidemia, CAD, severe aortic stenosis who presents to [facility] for AVR and CABG.   He was seen by cardiology 03/2024 after echo in June showing severe aortic stenosis, EF 60-65%, G1DD. Concern he was downplaying symptoms and cardiology felt appropriate to proceed with cardiac cath. On 05/14/2024 he had R/LHC showing RCA 99%, , cx 80%, PDA 70%, severe aortic valve stenosis. He was referred to TCTS for AVR and CABG consideration.   He had repeated RHC yesterday 10/6 showing normal L and R filling pressures. Repeat echo 10/6 showing EF 45-50%, global hypokinesis, G1DD.   Pump time: 2h 62m Xclamp time: 2h 67m    Pertinent  Medical History  arthritis, chronic back pain, type 2 diabetes, GERD, chronic bronchitis, hypertension, hyperlipidemia, CAD, severe aortic stenosis  Significant Hospital Events: Including procedures, antibiotic start and stop dates in addition to other pertinent events   10/7: admit ICU s/p AVR and CABG x 2 RSVG OM2 and 3  Interim History / Subjective:  Admit to ICU post-op  Objective   Blood pressure 124/86, pulse 87, temperature 97.9 F (36.6 C), resp. rate 15, height 5' 9 (1.753 m), weight 103.7 kg, SpO2 98%.        Intake/Output Summary (Last 24 hours) at 07/02/2024 1047 Last data filed at 07/02/2024 0958 Gross per 24 hour  Intake 1450 ml  Output 1825 ml  Net -375 ml   Filed Weights   07/01/24 1522 07/01/24 2000 07/02/24 0638  Weight: 103.7 kg 103.7 kg 103.7 kg    Examination: General: middle aged male, post op  HENT: pupils 2mm, ncat, anicteric sclera  Lungs: vented, lungs clear, diminished bases   Cardiovascular: s1s2, no murmur, no edema  Abdomen: rounded, soft, ogt with gi output LIS Extremities: no edema, RLE wrapped post-op Neuro: sedated GU: foley  Resolved Hospital Problem list    Assessment & Plan:  Multivessel CAD s/p CABG x2 RSVG to OM2 and 3 Severe aortic stenosis s/p aortic valve replacement  HFpEF, new  R/LHC showing RCA 99%, , cx 80%, PDA 70%, severe aortic valve stenosis. RHC yesterday 10/6 showing normal L and R filling pressures. Repeat echo 10/6 showing EF 45-50%, global hypokinesis, G1DD. On Farxiga , losartan  at home - post-op management per TCTS - CBC, BMET now - insulin  gtt, cbg per endotool  - con't weaning pressors as able to maintain MAP >70-90. Albumin boluses prn for low SV.  - tele monitoring/ pacing prn - mediastinal drains per TCTS - multimodal pain control per protocol- oxycodone , tramadol, morphine  with bowel regimen - ASA starting tonight  - metoprolol  BID once off pressors - complete post-op antibiotics - monitor electrolytes, replete PRN   Post bypass vasoplegia - rapid wean per TCTS protocol - CXR/ ABG now - full mechanical vent support - lung protective ventilation 6-8cc/kg Vt - VAP and PAD bundle in place  - titrate FiO2 to sat goal >92  - maintain peak/plats <30, driving pressures <84    Post op vent management  Chronic bronchitis  - full mechanical vent support - lung protective ventilation 6-8cc/kg Vt - VAP and PAD bundle in place  - titrate FiO2 to sat goal >92  - maintain  peak/plats <30, driving pressures <84    Expected post-operative ABLA Expected post-operative consumptive thrombocytopenia  - trend  - transfuse prn   Hypertension  Hyperlipidemia  - lipitor 40mg  daily  - losartan  100mg  daily - hold until off drips   Diabetes  on metformin , mounjaro, at home  - insulin  gtt  - cbg per endotool   GERD - ppi   Arthritis  Chronic back pain  takes gabapentin  200mg  at bedtime, zanaflex   - hold until off  sedation and extubated, though if prolonged intubation may need to restart gabapentin  to avoid withdrawal  Labs   CBC: Recent Labs  Lab 07/01/24 0744 07/01/24 1030 07/02/24 0437 07/02/24 0816 07/02/24 0944 07/02/24 1005 07/02/24 1015  WBC 7.7  --  7.5  --   --   --   --   HGB 15.3   < > 14.4 13.3 13.3 9.9* 9.9*  HCT 44.7   < > 42.6 39.0 39.0 29.0* 29.0*  MCV 94.1  --  93.6  --   --   --   --   PLT 261  --  237  --   --   --   --    < > = values in this interval not displayed.    Basic Metabolic Panel: Recent Labs  Lab 07/01/24 0744 07/01/24 1030 07/01/24 1403 07/01/24 1900 07/02/24 0437 07/02/24 0816 07/02/24 0944 07/02/24 1005 07/02/24 1015  NA 137   < > 137 136 137 136 139 139 138  K 4.7   < > 4.9 4.5 4.2 3.9 3.7 4.0 3.5  CL 101  --  101 101 103 99 103  --   --   CO2 25  --  27 25 25   --   --   --   --   GLUCOSE 97  --  150* 177* 110* 116* 88  --   --   BUN 14  --  12 15 14 15 15   --   --   CREATININE 1.13  --  1.18 1.40* 1.12 1.00 1.00  --   --   CALCIUM  9.3  --  9.1 8.9 8.9  --   --   --   --    < > = values in this interval not displayed.   GFR: Estimated Creatinine Clearance: 90.9 mL/min (by C-G formula based on SCr of 1 mg/dL). Recent Labs  Lab 07/01/24 0744 07/02/24 0437  WBC 7.7 7.5    Liver Function Tests: Recent Labs  Lab 07/01/24 1900  AST 23  ALT 26  ALKPHOS 69  BILITOT 0.7  PROT 6.2*  ALBUMIN 3.6   No results for input(s): LIPASE, AMYLASE in the last 168 hours. No results for input(s): AMMONIA in the last 168 hours.  ABG    Component Value Date/Time   PHART 7.371 07/02/2024 1005   PCO2ART 44.3 07/02/2024 1005   PO2ART 370 (H) 07/02/2024 1005   HCO3 27.5 07/02/2024 1015   TCO2 29 07/02/2024 1015   ACIDBASEDEF 2.0 05/14/2024 0833   O2SAT 80 07/02/2024 1015     Coagulation Profile: Recent Labs  Lab 07/01/24 1403  INR 1.0    Cardiac Enzymes: No results for input(s): CKTOTAL, CKMB, CKMBINDEX, TROPONINI in the  last 168 hours.  HbA1C: Hgb A1c MFr Bld  Date/Time Value Ref Range Status  07/01/2024 07:00 PM 5.4 4.8 - 5.6 % Final    Comment:    (NOTE) Diagnosis of Diabetes The following HbA1c ranges recommended by the American  Diabetes Association (ADA) may be used as an aid in the diagnosis of diabetes mellitus.  Hemoglobin             Suggested A1C NGSP%              Diagnosis  <5.7                   Non Diabetic  5.7-6.4                Pre-Diabetic  >6.4                   Diabetic  <7.0                   Glycemic control for                       adults with diabetes.    03/16/2020 03:12 PM 6.9 (H) 4.8 - 5.6 % Final    Comment:    (NOTE)         Prediabetes: 5.7 - 6.4         Diabetes: >6.4         Glycemic control for adults with diabetes: <7.0     CBG: Recent Labs  Lab 07/01/24 0656  GLUCAP 112*    Review of Systems:   As above  Past Medical History:  He,  has a past medical history of Acute lateral meniscal tear (06/09/2016), Arthritis, Atherosclerotic heart disease of native coronary artery without angina pectoris, Bariatric surgery status (05/18/2016), Benign essential hypertension (01/28/2021), Bursitis of right shoulder (01/21/2019), CAD (coronary artery disease), native coronary artery, Chronic bronchitis (HCC), Chronic lower back pain, Closed fracture of right distal radius (03/12/2020), Closed fracture of right proximal humerus (03/12/2020), DDD (degenerative disc disease), lumbar, Degenerative arthritis of right knee (05/09/2017), Degenerative disc disease, lumbar (07/07/2016), Degenerative lumbar spinal stenosis (04/07/2021), Diabetes mellitus without complication (HCC), Dislocation of left middle finger (01/21/2019), Encounter for immunization (01/28/2021), Erectile dysfunction (01/28/2021), Gastro-esophageal reflux (01/28/2021), History of nutritional deficiency (10/26/2021), Hyperglycemia due to type 2 diabetes mellitus (HCC) (01/28/2021), Hyperlipidemia,  Hypertensive heart disease without CHF, Lumbago with sciatica, right side (04/07/2021), 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 type 2 diabetes mellitus (HCC) (01/28/2021), Mixed hyperlipidemia (05/18/2016), Nonallopathic lesion of lumbosacral region (07/07/2016), Nonallopathic lesion of sacral region (07/07/2016), Nonallopathic lesion of thoracic region (07/07/2016), Obesity, Radiculopathy, lumbar region (04/07/2021), Shoulder joint pain (01/28/2021), Type II diabetes mellitus (HCC), Unstable angina (HCC), and Vitamin D  deficiency (01/28/2021).   Surgical History:   Past Surgical History:  Procedure Laterality Date   CARDIAC CATHETERIZATION N/A 05/18/2016   Procedure: Left Heart Cath and Coronary Angiography;  Surgeon: Lonni JONETTA Cash, MD;  [facility] INVASIVE CV LAB;  Service: Cardiovascular;  Laterality: N/A;   CARDIAC CATHETERIZATION N/A 05/18/2016   Procedure: Coronary Stent Intervention;  Surgeon: Lonni JONETTA Cash, MD;  Location: [facility] INVASIVE CV LAB;  Service: Cardiovascular;  Laterality: N/A;   CARPAL TUNNEL RELEASE Right 03/17/2020   Procedure: CARPAL TUNNEL RELEASE;  Surgeon: Josefina Chew, MD;  Location: [facility] ORS;  Service: Orthopedics;  Laterality: Right;   CORONARY ANGIOPLASTY     FOOT FRACTURE SURGERY Right 1970s   LAPAROSCOPIC GASTRIC BANDING  07/2007   by Dr. Gladis   ORIF HUMERUS FRACTURE Right 03/17/2020   Procedure: OPEN REDUCTION INTERNAL FIXATION (ORIF) PROXIMAL HUMERUS FRACTURE AND DISTAL RADIUS FRACTURE;  Surgeon: Josefina Chew, MD;  Location: [facility] ORS;  Service: Orthopedics;  Laterality: Right;  PILONIDAL CYST EXCISION  ~ 1985   took 8 out all at once   RIGHT HEART CATH N/A 07/01/2024   Procedure: RIGHT HEART CATH;  Surgeon: Zenaida Morene PARAS, MD;  Location: [facility] INVASIVE CV LAB;  Service: Cardiovascular;  Laterality: N/A;   RIGHT/LEFT HEART CATH AND CORONARY ANGIOGRAPHY N/A 05/14/2024   Procedure: RIGHT/LEFT HEART CATH  AND CORONARY ANGIOGRAPHY;  Surgeon: Wonda Sharper, MD;  Location: [facility] INVASIVE CV LAB;  Service: Cardiovascular;  Laterality: N/A;   SQUAMOUS CELL CARCINOMA EXCISION     right upper arm, left shoulder     Social History:   reports that he quit smoking about 32 years ago. His smoking use included cigarettes. He started smoking about 44 years ago. He has a 6 pack-year smoking history. He has quit using smokeless tobacco.  His smokeless tobacco use included snuff. He reports current alcohol use of about 7.0 standard drinks of alcohol per week. He reports that he does not use drugs.   Family History:  His family history includes Diabetes Mellitus I in his sister; Heart attack in his father; Heart disease in his father, mother, and sister; Stroke in his brother and father.   Allergies No Known Allergies   Home Medications  Prior to Admission medications   Medication Sig Start Date End Date Taking? Authorizing Provider  acetaminophen  (TYLENOL ) 500 MG tablet Take 1,500-2,000 mg by mouth every 6 (six) hours as needed for moderate pain (pain score 4-6).   Yes [provider]  aspirin  EC 81 MG tablet Take 81 mg by mouth daily.   Yes [provider]  atorvastatin  (LIPITOR) 40 MG tablet Take 40 mg by mouth daily.   Yes [provider]  cetirizine (ZYRTEC) 10 MG tablet Take 10 mg by mouth daily.   Yes [provider]  FARXIGA  10 MG TABS tablet Take 10 mg by mouth daily.  01/06/19  Yes [provider]  gabapentin  (NEURONTIN ) 100 MG capsule TAKE 2 CAPSULES(200 MG) BY MOUTH AT BEDTIME Patient taking differently: Take 400 mg by mouth at bedtime. 05/29/24  Yes Claudene Arthea HERO, DO  losartan  (COZAAR ) 100 MG tablet Take 100 mg by mouth daily.   Yes [provider]  metFORMIN  (GLUCOPHAGE ) 500 MG tablet Take 1,000 mg by mouth 2 (two) times daily with a meal.   Yes [provider]  mometasone (NASONEX) 50 MCG/ACT nasal spray Place 2 sprays into the nose  daily as needed (Rhinitis). 02/27/24  Yes [provider]  Multiple Vitamin (MULTIVITAMIN) capsule Take 1 capsule by mouth daily.   Yes [provider]  naproxen sodium (ALEVE) 220 MG tablet Take 220 mg by mouth daily as needed (pain).   Yes [provider]  tirzepatide CLOYDE) 12.5 MG/0.5ML Pen Inject 12.5 mg into the skin once a week. 01/25/24  Yes [provider]  tiZANidine  (ZANAFLEX ) 2 MG tablet TAKE 1 TABLET BY MOUTH EVERYDAY AT BEDTIME 06/26/23  Yes Smith, Zachary M, DO  nitroGLYCERIN  (NITROSTAT ) 0.4 MG SL tablet PLACE 1 TABLET UNDER THE TONGUE EVERY 5 MINUTES AS NEEDED FOR CHEST PAIN. 07/21/23   Bernie Lamar PARAS, MD     Critical care time: 74    Tinnie FORBES Adolph DEVONNA [facility] 07/02/24 10:59 AM  Please see [email or web address] for pager details.  From 7A-7P if no response, please call [phone] After hours, please call ELink [phone]

## 2024-07-02 NOTE — Plan of Care (Signed)
 Progressing Add All Education: Understanding of CV disease, CV risk reduction, and recovery process will improve Add Today at 0116 - Progressing by Rolfe Corean HERO, RN Add Individualized Educational Video(s) Add Today at [phone] - Progressing by Rolfe Corean HERO, RN Add Activity: Ability to return to baseline activity level will improve Add Today at 0116 - Progressing by Rolfe Corean HERO, RN Add Cardiovascular: Ability to achieve and maintain adequate cardiovascular perfusion will improve Add Today at 0116 - Progressing by Rolfe Corean HERO, RN Add Vascular access site(s) Level 0-1 will be maintained Add Today at 0116 - Progressing by Rolfe Corean HERO, RN Add Health Behavior/Discharge Planning: Ability to safely manage health-related needs after discharge will improve Add Today at 0116 - Progressing by Rolfe Corean HERO, RN Add Education: Knowledge of General Education information will improve Add Today at 0116 - Progressing by Rolfe Corean HERO, RN Add Health Behavior/Discharge Planning: Ability to manage health-related needs will improve Add Today at 0116 - Progressing by Rolfe Corean HERO, RN Add Clinical Measurements: Ability to maintain clinical measurements within normal limits will improve Add Today at 0116 - Progressing by Rolfe Corean HERO, RN Add Will remain free from infection Add Today at 0116 - Progressing by Rolfe Corean HERO, RN Add Diagnostic test results will improve Add Today at 0116 - Progressing by Rolfe Corean HERO, RN Add Respiratory complications will improve Add Today at 0116 - Progressing by Rolfe Corean HERO, RN Add Cardiovascular complication will be avoided Add Today at 0116 - Progressing by Rolfe Corean HERO, RN Add Activity: Risk for activity intolerance will decrease Add Today at 0116 - Progressing by Rolfe Corean HERO, RN Add Nutrition: Adequate  nutrition will be maintained Add Today at 0116 - Progressing by Rolfe Corean HERO, RN Add Coping: Level of anxiety will decrease Add Today at 0116 - Progressing by Rolfe Corean HERO, RN Add Elimination: Will not experience complications related to bowel motility Add Today at 0116 - Progressing by Rolfe Corean HERO, RN Add Will not experience complications related to urinary retention Add Today at 0116 - Progressing by Rolfe Corean HERO, RN Add Pain Managment: General experience of comfort will improve and/or be controlled Add Today at 0116 - Progressing by Rolfe Corean HERO, RN Add Safety: Ability to remain free from injury will improve Add Today at 0116 - Progressing by Rolfe Corean HERO, RN Add Skin Integrity: Risk for impaired skin integrity will decrease Add Today at 0116 - Progressing by Rolfe Corean HERO, RN

## 2024-07-02 NOTE — Transfer of Care (Signed)
 Immediate Anesthesia Transfer of Care Note  Patient: Tim Cook  Procedure(s) Performed: REPLACEMENT, AORTIC VALVE, OPEN USING INSPIRIS (Chest) CORONARY ARTERY BYPASS GRAFTING (CABG) X2 USING ENDOSCOPICALLY HARVESTED RIGHT GREATER SAPHENOUS VEIN (Chest) ECHOCARDIOGRAM, TRANSESOPHAGEAL, INTRAOPERATIVE  Patient Location: SICU  Anesthesia Type:General  Level of Consciousness: sedated and Patient remains intubated per anesthesia plan  Airway & Oxygen Therapy: Patient remains intubated per anesthesia plan and Patient placed on Ventilator (see vital sign flow sheet for setting)  Post-op Assessment: Report given to RN and Post -op Vital signs reviewed and stable  Post vital signs: Reviewed and stable  Last Vitals:  Vitals Value Taken Time  BP 98/51   Temp    Pulse 92 07/02/24 14:10  Resp 21 07/02/24 14:10  SpO2 99 % 07/02/24 14:10  Vitals shown include unfiled device data.  Last Pain:  Vitals:   07/02/24 0718  TempSrc:   PainSc: 0-No pain         Complications: No notable events documented.

## 2024-07-02 NOTE — Hospital Course (Addendum)
 History of Present Illness:  Tim Cook is a 63 y.o. male who presents for surgical evaluation of severe aortic stenosis and coronary artery disease.  He has a hx of PCI in 2017, and recently he developed chest pain after visiting with friends.  He subsequently underwent further evaluation which identified a heavily calcified aortic valve, with measured gradients in the severe range, circumflex disease, and LV dysfunction.     He denies any chest pain, shortness of breath, or syncopal episodes.  He does have neuropathy in his lower extremities and weakness in his right arm.  He has also had a lap band in the past, but denies any dysphagia.    He has undergone complete diagnostic evaluation including left and right heart catheterization, echocardiogram and MRI and is felt to be candidate for aortic valve replacement and CABG.  Hospital course:  Patient was admitted for right heart catheterization on 07/01/2024 to complete the diagnostic evaluation.  He was felt to be medically stable and on 07/02/2024 he was taken the operating room at which time he underwent CABG x 2 as well as aortic valve replacement with a 25 Inspiris Resilia bioprosthetic.  He tolerated the procedure well and was taken to the surgical intensive care unit in stable condition.  Postoperative hospital course: He was extubated without difficulty using standard postcardiac surgical protocols.  He has required some neo for blood pressure support due to postop vasoplegia but maintained good hemodynamics. Neo was weaned early postoperatively. He was routinely diuresed. He does have an expected acute blood loss anemia which is minor/not clinically significant and being monitored clinically. Epicardial pacing wires and chest tubes were removed without complication on POD2. He was felt stable for transfer to the progressive unit. He developed sinus tachyardia, Lopressor  was titrated. He was ambulating well on room air and his bowels began  moving appropriately on 10/10. He was aggressively diuresed. Home Metformin  was restarted once he was tolerating a diet. Home Farxiga  and Mounjaro were restarted at discharge. He would be discharged on 10 days of PO Lasix . His incisions were healing well without sign of infection. He was felt stable for discharge home.

## 2024-07-02 NOTE — Anesthesia Procedure Notes (Signed)
 Procedure Name: Intubation Date/Time: 07/02/2024 7:42 AM  Performed by: Elby Raelene SAUNDERS, CRNAPre-anesthesia Checklist: Patient identified, Emergency Drugs available, Suction available and Patient being monitored Patient Re-evaluated:Patient Re-evaluated prior to induction Oxygen Delivery Method: Circle System Utilized Preoxygenation: Pre-oxygenation with 100% oxygen Induction Type: IV induction, Rapid sequence and Cricoid Pressure applied Laryngoscope Size: Miller and 2 Grade View: Grade II Tube type: Oral Tube size: 8.0 mm Number of attempts: 2 Airway Equipment and Method: Stylet Placement Confirmation: ETT inserted through vocal cords under direct vision, positive ETCO2 and breath sounds checked- equal and bilateral Secured at: 23 cm Tube secured with: Tape Dental Injury: Teeth and Oropharynx as per pre-operative assessment

## 2024-07-02 NOTE — Brief Op Note (Signed)
 07/01/2024 - 07/02/2024  6:59 AM  PATIENT:  Tim Cook  63 y.o. male  PRE-OPERATIVE DIAGNOSIS:  CAD AS CHF  POST-OPERATIVE DIAGNOSIS:  CAD, AS, CHF  PROCEDURE:  Procedure(s): REPLACEMENT, AORTIC VALVE, OPEN USING INSPIRIS (N/A) CORONARY ARTERY BYPASS GRAFTING (CABG) X2 USING ENDOSCOPICALLY HARVESTED RIGHT GREATER SAPHENOUS VEIN (N/A) ECHOCARDIOGRAM, TRANSESOPHAGEAL, INTRAOPERATIVE (N/A) Vein harvest time: Vein prep time: SVG-OM2, SVG-OM3  SURGEON:  Surgeons and Role:    * Lightfoot, Linnie KIDD, MD - Primary  PHYSICIAN ASSISTANT: Adreonna Yontz PA-C  ASSISTANTS: LUKE MAYOTTE RNFA   ANESTHESIA:   general  EBL:  1108 mL   BLOOD ADMINISTERED:none  DRAINS: MEDIASTINAL AND PLEURAL CHEST TUBES   LOCAL MEDICATIONS USED:  NONE  SPECIMEN:  Source of Specimen:  AORTIC VALVE LEAFLETS  DISPOSITION OF SPECIMEN:  PATHOLOGY  COUNTS:  YES  TOURNIQUET:  * No tourniquets in log *  DICTATION: .Dragon Dictation  PLAN OF CARE: Admit to inpatient   PATIENT DISPOSITION:  ICU - intubated and hemodynamically stable.   Delay start of Pharmacological VTE agent (>24hrs) due to surgical blood loss or risk of bleeding: yes  COMPLICATIONS: NO KNOWN

## 2024-07-02 NOTE — Progress Notes (Signed)
 RT attempted rapid wean protocol at this time, patient failed due to low respiratory rate/ still sleepy from sedation. Will attempt on next shift.

## 2024-07-03 ENCOUNTER — Inpatient Hospital Stay (HOSPITAL_COMMUNITY)

## 2024-07-03 ENCOUNTER — Encounter (HOSPITAL_COMMUNITY): Payer: Self-pay | Admitting: Thoracic Surgery (Cardiothoracic Vascular Surgery)

## 2024-07-03 DIAGNOSIS — I350 Nonrheumatic aortic (valve) stenosis: Secondary | ICD-10-CM | POA: Diagnosis not present

## 2024-07-03 LAB — POCT I-STAT 7, (LYTES, BLD GAS, ICA,H+H)
Acid-base deficit: 3 mmol/L — ABNORMAL HIGH (ref 0.0–2.0)
Bicarbonate: 22.2 mmol/L (ref 20.0–28.0)
Calcium, Ion: 1.14 mmol/L — ABNORMAL LOW (ref 1.15–1.40)
HCT: 30 % — ABNORMAL LOW (ref 39.0–52.0)
Hemoglobin: 10.2 g/dL — ABNORMAL LOW (ref 13.0–17.0)
O2 Saturation: 97 %
Patient temperature: 37.4
Potassium: 4.2 mmol/L (ref 3.5–5.1)
Sodium: 138 mmol/L (ref 135–145)
TCO2: 23 mmol/L (ref 22–32)
pCO2 arterial: 38.8 mmHg (ref 32–48)
pH, Arterial: 7.367 (ref 7.35–7.45)
pO2, Arterial: 95 mmHg (ref 83–108)

## 2024-07-03 LAB — CBC
HCT: 32.1 % — ABNORMAL LOW (ref 39.0–52.0)
HCT: 34.3 % — ABNORMAL LOW (ref 39.0–52.0)
Hemoglobin: 10.9 g/dL — ABNORMAL LOW (ref 13.0–17.0)
Hemoglobin: 11.6 g/dL — ABNORMAL LOW (ref 13.0–17.0)
MCH: 32 pg (ref 26.0–34.0)
MCH: 32 pg (ref 26.0–34.0)
MCHC: 34 g/dL (ref 30.0–36.0)
MCHC: 33.8 g/dL (ref 30.0–36.0)
MCV: 94.1 fL (ref 80.0–100.0)
MCV: 94.8 fL (ref 80.0–100.0)
Platelets: 165 K/uL (ref 150–400)
Platelets: 165 K/uL (ref 150–400)
RBC: 3.41 MIL/uL — ABNORMAL LOW (ref 4.22–5.81)
RBC: 3.62 MIL/uL — ABNORMAL LOW (ref 4.22–5.81)
RDW: 12.5 % (ref 11.5–15.5)
RDW: 12.6 % (ref 11.5–15.5)
WBC: 12.1 K/uL — ABNORMAL HIGH (ref 4.0–10.5)
WBC: 13.4 K/uL — ABNORMAL HIGH (ref 4.0–10.5)
nRBC: 0 % (ref 0.0–0.2)
nRBC: 0 % (ref 0.0–0.2)

## 2024-07-03 LAB — BASIC METABOLIC PANEL WITH GFR
Anion gap: 10 (ref 5–15)
Anion gap: 10 (ref 5–15)
BUN: 12 mg/dL (ref 8–23)
BUN: 13 mg/dL (ref 8–23)
CO2: 22 mmol/L (ref 22–32)
CO2: 25 mmol/L (ref 22–32)
Calcium: 7.7 mg/dL — ABNORMAL LOW (ref 8.9–10.3)
Calcium: 8 mg/dL — ABNORMAL LOW (ref 8.9–10.3)
Chloride: 103 mmol/L (ref 98–111)
Chloride: 97 mmol/L — ABNORMAL LOW (ref 98–111)
Creatinine, Ser: 1.03 mg/dL (ref 0.61–1.24)
Creatinine, Ser: 1.26 mg/dL — ABNORMAL HIGH (ref 0.61–1.24)
GFR, Estimated: >60 mL/min (ref >60)
GFR, Estimated: >60 mL/min (ref >60)
Glucose, Bld: 119 mg/dL — ABNORMAL HIGH (ref 70–99)
Glucose, Bld: 188 mg/dL — ABNORMAL HIGH (ref 70–99)
Potassium: 4.1 mmol/L (ref 3.5–5.1)
Potassium: 4.1 mmol/L (ref 3.5–5.1)
Sodium: 135 mmol/L (ref 135–145)
Sodium: 132 mmol/L — ABNORMAL LOW (ref 135–145)

## 2024-07-03 LAB — GLUCOSE, CAPILLARY
Glucose-Capillary: 106 mg/dL — ABNORMAL HIGH (ref 70–99)
Glucose-Capillary: 114 mg/dL — ABNORMAL HIGH (ref 70–99)
Glucose-Capillary: 114 mg/dL — ABNORMAL HIGH (ref 70–99)
Glucose-Capillary: 116 mg/dL — ABNORMAL HIGH (ref 70–99)
Glucose-Capillary: 117 mg/dL — ABNORMAL HIGH (ref 70–99)
Glucose-Capillary: 118 mg/dL — ABNORMAL HIGH (ref 70–99)
Glucose-Capillary: 118 mg/dL — ABNORMAL HIGH (ref 70–99)
Glucose-Capillary: 119 mg/dL — ABNORMAL HIGH (ref 70–99)
Glucose-Capillary: 148 mg/dL — ABNORMAL HIGH (ref 70–99)
Glucose-Capillary: 170 mg/dL — ABNORMAL HIGH (ref 70–99)
Glucose-Capillary: 209 mg/dL — ABNORMAL HIGH (ref 70–99)
Glucose-Capillary: 99 mg/dL (ref 70–99)

## 2024-07-03 LAB — ECHO INTRAOPERATIVE TEE
Area-P 1/2: 2.94 cm2
Height: 69 in
Weight: 3657.87 [oz_av]

## 2024-07-03 LAB — MAGNESIUM
Magnesium: 2.3 mg/dL (ref 1.7–2.4)
Magnesium: 2 mg/dL (ref 1.7–2.4)

## 2024-07-03 LAB — SURGICAL PATHOLOGY

## 2024-07-03 MED ORDER — INSULIN GLARGINE 100 UNIT/ML ~~LOC~~ SOLN
8.0000 [IU] | Freq: Every day | SUBCUTANEOUS | Status: DC
  Filled 2024-07-03: qty 0.08

## 2024-07-03 MED ORDER — ENSURE PLUS HIGH PROTEIN PO LIQD
237.0000 mL | Freq: Two times a day (BID) | ORAL | Status: DC
Start: 2024-07-03 — End: 2024-07-07
  Administered 2024-07-03 – 2024-07-05 (×3): 237 mL via ORAL

## 2024-07-03 MED ORDER — INSULIN ASPART 100 UNIT/ML IJ SOLN
0.0000 [IU] | INTRAMUSCULAR | Status: DC
  Administered 2024-07-03: 8 [IU] via SUBCUTANEOUS
  Administered 2024-07-03: 4 [IU] via SUBCUTANEOUS
  Administered 2024-07-03 – 2024-07-04 (×2): 2 [IU] via SUBCUTANEOUS

## 2024-07-03 MED ORDER — TIZANIDINE HCL 4 MG PO TABS
2.0000 mg | ORAL_TABLET | Freq: Every day | ORAL | Status: DC
Start: 2024-07-03 — End: 2024-07-07
  Administered 2024-07-03 – 2024-07-06 (×4): 2 mg via ORAL
  Filled 2024-07-03 (×5): qty 1

## 2024-07-03 MED ORDER — ADULT MULTIVITAMIN W/MINERALS CH
1.0000 | ORAL_TABLET | Freq: Every day | ORAL | Status: DC
  Administered 2024-07-03 – 2024-07-07 (×5): 1 via ORAL
  Filled 2024-07-03 (×5): qty 1

## 2024-07-03 MED ORDER — DIPHENHYDRAMINE HCL 25 MG PO CAPS
50.0000 mg | ORAL_CAPSULE | Freq: Once | ORAL | Status: AC
  Administered 2024-07-03: 50 mg via ORAL
  Filled 2024-07-03: qty 2

## 2024-07-03 MED ORDER — GABAPENTIN 400 MG PO CAPS
400.0000 mg | ORAL_CAPSULE | Freq: Every day | ORAL | Status: DC
Start: 2024-07-03 — End: 2024-07-07
  Administered 2024-07-03 – 2024-07-06 (×4): 400 mg via ORAL
  Filled 2024-07-03 (×4): qty 1

## 2024-07-03 MED ORDER — NOREPINEPHRINE 4 MG/250ML-% IV SOLN
0.0000 ug/min | INTRAVENOUS | Status: DC
  Administered 2024-07-03: 2 ug/min via INTRAVENOUS
  Filled 2024-07-03: qty 250

## 2024-07-03 MED ORDER — ENOXAPARIN SODIUM 40 MG/0.4ML IJ SOSY
40.0000 mg | PREFILLED_SYRINGE | INTRAMUSCULAR | Status: DC
Start: 2024-07-03 — End: 2024-07-07
  Administered 2024-07-03 – 2024-07-06 (×4): 40 mg via SUBCUTANEOUS
  Filled 2024-07-03 (×4): qty 0.4

## 2024-07-03 MED ORDER — FUROSEMIDE 10 MG/ML IJ SOLN
40.0000 mg | Freq: Once | INTRAMUSCULAR | Status: AC
  Administered 2024-07-03: 40 mg via INTRAVENOUS
  Filled 2024-07-03: qty 4

## 2024-07-03 NOTE — Progress Notes (Addendum)
 [address] [place] [address]             [phone]     [facility] DAILY ICU PROGRESS NOTE                   [address]            [place] [address]          [phone]   1 Day Post-Op Procedure(s) (LRB): REPLACEMENT, AORTIC VALVE, OPEN USING INSPIRIS (N/A) CORONARY ARTERY BYPASS GRAFTING (CABG) X2 USING ENDOSCOPICALLY HARVESTED RIGHT GREATER SAPHENOUS VEIN (N/A) ECHOCARDIOGRAM, TRANSESOPHAGEAL, INTRAOPERATIVE (N/A)  Total Length of Stay:  LOS: 2 days   Subjective: Feels some soreness  Objective: Vital signs in last 24 hours: Temp:  [97 F (36.1 C)-99.9 F (37.7 C)] 99.7 F (37.6 C) (10/08 0700) Pulse Rate:  [74-114] 79 (10/08 0700) Cardiac Rhythm: Normal sinus rhythm (10/07 2000) Resp:  [5-31] 14 (10/08 0700) SpO2:  [75 %-100 %] 98 % (10/08 0700) Arterial Line BP: (78-265)/(39-87) 126/52 (10/08 0700) FiO2 (%):  [40 %-50 %] 40 % (10/07 2000) Weight:  [112.1 kg] 112.1 kg (10/08 0353)  Filed Weights   07/01/24 2000 07/02/24 0638 07/03/24 0353  Weight: 103.7 kg 103.7 kg 112.1 kg    Weight change: 8.408 kg   Hemodynamic parameters for last 24 hours: CVP:  [1 mmHg-32 mmHg] 8 mmHg CO:  [3.6 L/min-10 L/min] 7.1 L/min CI:  [1.7 L/min/m2-4.6 L/min/m2] 3.2 L/min/m2  Intake/Output from previous day: 10/07 0701 - 10/08 0700 In: 6187.1 [I.V.:3369.4; Blood:720; IV Piggyback:2097.7] Out: 6338 [Urine:4490; Emesis/NG output:350; Blood:1108; Chest Tube:390]  Intake/Output this shift: No intake/output data recorded.  Current Meds: Scheduled Meds:  acetaminophen   1,000 mg Oral Q6H   Or   acetaminophen  (TYLENOL ) oral liquid 160 mg/5 mL  1,000 mg Per Tube Q6H   aspirin  EC  325 mg Oral Daily   Or   aspirin   324 mg Per Tube Daily   atorvastatin   40 mg Oral Daily   bisacodyl   10 mg Oral Daily   Or   bisacodyl   10 mg Rectal Daily   Chlorhexidine  Gluconate Cloth  6 each Topical Daily   docusate sodium   200 mg Oral Daily    metoCLOPramide  (REGLAN ) injection  10 mg Intravenous Q6H   metoprolol  tartrate  12.5 mg Oral BID   Or   metoprolol  tartrate  12.5 mg Per Tube BID   mupirocin ointment  1 Application Nasal BID   [START ON 07/04/2024] pantoprazole  40 mg Oral Daily   pantoprazole (PROTONIX) IV  40 mg Intravenous QHS   sodium chloride  flush  3 mL Intravenous Q12H   Continuous Infusions:  sodium chloride  20 mL/hr at 07/03/24 0600   sodium chloride      sodium chloride      albumin human 999 mL/hr at 07/03/24 0600    ceFAZolin  (ANCEF ) IV 2 g (07/03/24 9367)   clevidipine     dexmedetomidine (PRECEDEX) IV infusion 0.2 mcg/kg/hr (07/03/24 0600)   insulin  0.9 Units/hr (07/03/24 0600)   lactated ringers      lactated ringers  20 mL/hr at 07/03/24 0600   phenylephrine  (NEO-SYNEPHRINE) Adult infusion 70 mcg/min (07/03/24 0600)   PRN Meds:.sodium chloride , albumin human, dextrose , metoprolol  tartrate, midazolam , morphine  injection, ondansetron  (ZOFRAN ) IV, oxyCODONE , polyethylene glycol, sodium chloride  flush, traMADol  General appearance: alert, cooperative, and no distress Heart: regular rate and rhythm Lungs: dim in bases Abdomen: benign Extremities:  no edema Wound: incis healing well Neuro: grossly intact  Lab Results: CBC: Recent Labs    07/02/24 2004 07/03/24 0230 07/03/24 0507  WBC 11.6*  --  12.1*  HGB 12.2*  11.6* 10.2* 10.9*  HCT 35.9*  34.0* 30.0* 32.1*  PLT 153  --  165   BMET:  Recent Labs    07/02/24 2004 07/03/24 0230 07/03/24 0507  NA 139  141 138 135  K 4.3  4.4 4.2 4.1  CL 110  --  103  CO2 24  --  22  GLUCOSE 135*  --  119*  BUN 12  --  12  CREATININE 1.03  --  1.03  CALCIUM  8.0*  --  7.7*    CMET: Lab Results  Component Value Date   WBC 12.1 (H) 07/03/2024   HGB 10.9 (L) 07/03/2024   HCT 32.1 (L) 07/03/2024   PLT 165 07/03/2024   GLUCOSE 119 (H) 07/03/2024   CHOL 103 07/02/2024   TRIG 78 07/02/2024   HDL 43 07/02/2024   LDLCALC 44 07/02/2024   ALT 26  07/01/2024   AST 23 07/01/2024   NA 135 07/03/2024   K 4.1 07/03/2024   CL 103 07/03/2024   CREATININE 1.03 07/03/2024   BUN 12 07/03/2024   CO2 22 07/03/2024   INR 1.3 (H) 07/02/2024   HGBA1C 5.4 07/01/2024      PT/INR:  Recent Labs    07/02/24 1425  LABPROT 17.3*  INR 1.3*   Radiology: DG Chest Port 1 View Result Date: 07/03/2024 CLINICAL DATA:  Postop from aortic valve replacement and CABG. EXAM: PORTABLE CHEST 1 VIEW COMPARISON:  07/02/2024 FINDINGS: Endotracheal tube and nasogastric tube have been removed since previous study. Right jugular central venous catheter remains in place. No pneumothorax visualized. Increased mild bibasilar atelectasis noted. Heart size remains stable. Prior CABG and aortic valve replacement again seen. IMPRESSION: Increased mild bibasilar atelectasis. No pneumothorax visualized. Electronically Signed   By: Norleen DELENA Kil M.D.   On: 07/03/2024 06:16   DG Chest Port 1 View Result Date: 07/02/2024 CLINICAL DATA:  Status post CABG and aortic valve replacement. EXAM: PORTABLE CHEST 1 VIEW COMPARISON:  07/01/2024 FINDINGS: Endotracheal tube in satisfactory position. Nasogastric tube extending into the stomach with its tip and side hole in the mid stomach. Interval post CABG changes and prosthetic aortic valve. Right jugular catheter tip in the proximal superior vena cava. No pneumothorax. Minimal pneumomediastinum. Interval mild bibasilar atelectasis. Old, healed right rib fractures. IMPRESSION: 1. Interval post CABG changes and prosthetic aortic valve. 2. Minimal pneumomediastinum. 3. Mild bibasilar atelectasis. Electronically Signed   By: Elspeth Bathe M.D.   On: 07/02/2024 15:25     Assessment/Plan: S/P Procedure(s) (LRB): REPLACEMENT, AORTIC VALVE, OPEN USING INSPIRIS (N/A) CORONARY ARTERY BYPASS GRAFTING (CABG) X2 USING ENDOSCOPICALLY HARVESTED RIGHT GREATER SAPHENOUS VEIN (N/A) ECHOCARDIOGRAM, TRANSESOPHAGEAL, INTRAOPERATIVE (N/A) POD#1  1 afeb,  NSR, excellent hemodynamics , gtts: neo 2 O2 sats good, ABG looks good 3 excellent UOP, did slow overnight, weight 9 KG > preop if accurate, will need some diuresis 4 CT 390 ml recorded postop 5 BS well controlled on insulin  gtt, routine protocols 6 normal renal fxn, K+ and Mg++ normal 7 minor reactive leukocytosis- monitor 8 minor expected ABLA- monitor 9 CXR - bibasilar atx 10 wean off neo 11 routine pulm hygiene and rehab protocols 12 PCCM assisting w/ ICU management    Lemond FORBES Cera PA-C Pager [phone]  07/03/2024 7:07 AM  Agree Doing well POD  1 progression  Ott Zimmerle O Alina Gilkey

## 2024-07-03 NOTE — Progress Notes (Signed)
 NAME:  DYLON CORREA, MRN:  [id number], DOB:  1961-07-11, LOS: 2 ADMISSION DATE:  07/01/2024, CONSULTATION DATE:  10/7 REFERRING MD:  Shyrl MA CHIEF COMPLAINT:  s/p AVR, CABG   History of Present Illness:  63 year old male with past medical history of arthritis, chronic back pain, type 2 diabetes, GERD, chronic bronchitis, hypertension, hyperlipidemia, CAD, severe aortic stenosis who presents to [facility] for AVR and CABG.   He was seen by cardiology 03/2024 after echo in June showing severe aortic stenosis, EF 60-65%, G1DD. Concern he was downplaying symptoms and cardiology felt appropriate to proceed with cardiac cath. On 05/14/2024 he had R/LHC showing RCA 99%, , cx 80%, PDA 70%, severe aortic valve stenosis. He was referred to [facility] for AVR and CABG consideration.   He had repeated RHC yesterday 10/6 showing normal L and R filling pressures. Repeat echo 10/6 showing EF 45-50%, global hypokinesis, G1DD.   Pump time: 2h 2m Xclamp time: 2h 77m    Pertinent  Medical History  arthritis, chronic back pain, type 2 diabetes, GERD, chronic bronchitis, hypertension, hyperlipidemia, CAD, severe aortic stenosis  Significant Hospital Events: Including procedures, antibiotic start and stop dates in addition to other pertinent events   10/7: admit ICU s/p AVR and CABG x 2 RSVG OM2 and 3  Interim History / Subjective:  Remains on neosynephrine. His pain is well controlled. No acute issues.   Objective   Blood pressure 124/86, pulse 79, temperature 99.7 F (37.6 C), resp. rate 14, height 5' 9 (1.753 m), weight 112.1 kg, SpO2 98%. CVP:  [1 mmHg-32 mmHg] 8 mmHg CO:  [3.6 L/min-10 L/min] 7.1 L/min CI:  [1.7 L/min/m2-4.6 L/min/m2] 3.2 L/min/m2  Vent Mode: PSV;CPAP FiO2 (%):  [40 %-50 %] 40 % Set Rate:  [4 bmp-16 bmp] 4 bmp Vt Set:  [560 mL] 560 mL PEEP:  [5 cmH20] 5 cmH20 Pressure Support:  [10 cmH20] 10 cmH20 Plateau Pressure:  [18 cmH20] 18 cmH20   Intake/Output Summary (Last 24 hours) at  07/03/2024 0723 Last data filed at 07/03/2024 0600 Gross per 24 hour  Intake 6187.06 ml  Output 6338 ml  Net -150.94 ml   Filed Weights   07/01/24 2000 07/02/24 0638 07/03/24 0353  Weight: 103.7 kg 103.7 kg 112.1 kg    Examination: General: Middle aged man lying in bed in NAD  HENT: [place]/AT, eyes anicteric Lungs: Breathing comfortably on , reduced basilar breath sounds. Clear anteriorly. Cardiovascular: S1S2, RRR Abdomen: soft, NT Extremities: no cyanosis, +edema Neuro: awake, alert, answering questions appropriately GU: foley with clear yellow urine  BUN 12 Cr 1.03 WBC 12.1 H/H 10.9/32.1 Platelets 165 CXR personally reviewed> low lung volumes, mild pulm edema. RIJ CVC. Chest tubes.  Resolved Hospital Problem list    Assessment & Plan:  Multivessel CAD s/p CABG x2 RSVG to OM2 and 3 Severe aortic stenosis s/p aortic valve replacement  HFpEF, new  Post-op vasoplegia; may be related to PTA losartan  R/LHC showing RCA 99%,  Lcx 80%, PDA 70%, severe aortic valve stenosis. RHC yesterday 10/6 showing normal L and R filling pressures. Repeat echo 10/6 showing EF 45-50%, global hypokinesis, G1DD. On Farxiga , losartan  at home -post-op care per [facility] -change neosynephrine to norepinephrine -completing antibiotics today -pain control per protocol -tele monitoring -con't chest tubes -start lovenox -keeping pacing wires today  -d/c foley -hold metoprolol  until off pressors -aspirin , statin   -pulmonary hygiene -mobilize, advance diet as tolerated  Expected post-operative ABLA Expected post-operative consumptive thrombocytopenia  -transfuse for Hb <7 or hemodynamically  significant bleeding -monitor  Hypertension  Hyperlipidemia  -con't statin -hold PTA losartan   Diabetes; A1c 5.4 -hold PTA metformin , mounjaro  -transition to SSI PRN; had lower BG today despite insulin  gtt being off so holding on long-acting insulin  for now -goal BG 140-180  GERD -con't PPI  Arthritis   Chronic back pain  -restart gabapentin  and zanaflex   H/o lap band surgery -monitor for poor PO tolerance  H/o OSA -CPAP QHS  Labs   CBC: Recent Labs  Lab 07/01/24 0744 07/01/24 1030 07/02/24 0437 07/02/24 0816 07/02/24 1145 07/02/24 1151 07/02/24 1306 07/02/24 1425 07/02/24 2004 07/03/24 0230 07/03/24 0507  WBC 7.7  --  7.5  --   --   --   --  13.9* 11.6*  --  12.1*  HGB 15.3   < > 14.4   < > 10.1*   < > 10.2* 12.9*  11.2* 12.2*  11.6* 10.2* 10.9*  HCT 44.7   < > 42.6   < > 29.5*   < > 30.0* 38.0*  33.0* 35.9*  34.0* 30.0* 32.1*  MCV 94.1  --  93.6  --   --   --   --  93.6 93.7  --  94.1  PLT 261  --  237  --  171  --   --  165 153  --  165   < > = values in this interval not displayed.    Basic Metabolic Panel: Recent Labs  Lab 07/01/24 1403 07/01/24 1900 07/02/24 0437 07/02/24 0816 07/02/24 1050 07/02/24 1118 07/02/24 1151 07/02/24 1225 07/02/24 1306 07/02/24 1425 07/02/24 2004 07/03/24 0230 07/03/24 0507  NA 137 136 137   < > 137   < > 138   < > 138 143 139  141 138 135  K 4.9 4.5 4.2   < > 4.6   < > 5.1   < > 4.1 3.3* 4.3  4.4 4.2 4.1  CL 101 101 103   < > 102  --  104  --  102  --  110  --  103  CO2 27 25 25   --   --   --   --   --   --   --  24  --  22  GLUCOSE 150* 177* 110*   < > 92  --  118*  --  177*  --  135*  --  119*  BUN 12 15 14    < > 13  --  14  --  14  --  12  --  12  CREATININE 1.18 1.40* 1.12   < > 0.90  --  0.90  --  0.90  --  1.03  --  1.03  CALCIUM  9.1 8.9 8.9  --   --   --   --   --   --   --  8.0*  --  7.7*  MG  --   --   --   --   --   --   --   --   --   --  3.0*  --  2.3   < > = values in this interval not displayed.   GFR: Estimated Creatinine Clearance: 91.8 mL/min (by C-G formula based on SCr of 1.03 mg/dL). Recent Labs  Lab 07/02/24 0437 07/02/24 1425 07/02/24 2004 07/03/24 0507  WBC 7.5 13.9* 11.6* 12.1*    Liver Function Tests: Recent Labs  Lab 07/01/24 1900  AST 23  ALT 26  ALKPHOS  69  BILITOT 0.7   PROT 6.2*  ALBUMIN 3.6   Critical care time:     This patient is critically ill with multiple organ system failure which requires frequent high complexity decision making, assessment, support, evaluation, and titration of therapies. This was completed through the application of advanced monitoring technologies and extensive interpretation of multiple databases. During this encounter critical care time was devoted to patient care services described in this note for 33 minutes.  Leita SHAUNNA Gaskins, DO 07/03/24 12:39 PM [facility]  For contact information, see Amion. If no response to pager, please call PCCM consult pager. After hours, 7PM- 7AM, please call Elink.

## 2024-07-03 NOTE — Progress Notes (Signed)
   [address] [place] [address]             [phone]   Resting comfortably  BP 126/64   Pulse 97   Temp (P) 99.6 F (37.6 C) (Oral)   Resp (!) 26   Ht 5' 9 (1.753 m)   Wt 112.1 kg   SpO2 96%   BMI 36.50 kg/m  2L 95% CI 5, CVP 2  Intake/Output Summary (Last 24 hours) at 07/03/2024 1814 Last data filed at 07/03/2024 1740 Gross per 24 hour  Intake 1982.16 ml  Output 2810 ml  Net -827.84 ml   K 4.1, creatinine 1.26 Hct 34  Doing well POD # 1  Clevon Khader C. Kerrin, MD [facility] [phone]

## 2024-07-03 NOTE — Progress Notes (Signed)
 Advanced Heart Failure Rounding Note  Cardiologist: None  Chief Complaint:  CABG/ AVR Subjective:   S/P CABG x2 AVR developed SVT. Started on Amio drip.  Arrived to the unit  on intubated/sedated On low dose Neo.   Now off all drips and extubated.   Flotrak CO 9.3 CI 4.2  SVR 508   Denies SOB.   Objective:   Weight Range: 112.1 kg Body mass index is 36.5 kg/m.   Vital Signs:   Temp:  [97 F (36.1 C)-99.9 F (37.7 C)] 99.7 F (37.6 C) (10/08 0824) Pulse Rate:  [74-114] 81 (10/08 0824) Resp:  [5-31] 15 (10/08 0824) SpO2:  [75 %-100 %] 96 % (10/08 0824) Arterial Line BP: (0-265)/(0-87) 131/52 (10/08 0824) FiO2 (%):  [40 %-50 %] 40 % (10/07 2000) Weight:  [112.1 kg] 112.1 kg (10/08 0353) Last BM Date :  (PTA)  Weight change: Filed Weights   07/01/24 2000 07/02/24 0638 07/03/24 0353  Weight: 103.7 kg 103.7 kg 112.1 kg    Intake/Output:   Intake/Output Summary (Last 24 hours) at 07/03/2024 0942 Last data filed at 07/03/2024 0800 Gross per 24 hour  Intake 5867.36 ml  Output 6403 ml  Net -535.64 ml      Physical Exam  General:   No resp difficulty Neck:JVP 10-11 Cor: Regular rate & rhythm.  Lungs: clear Abdomen: soft, nontender, nondistended.  Extremities: no  edema Neuro: alert & oriented x3  Telemetry  SR 90s    EKG   na Labs    CBC Recent Labs    07/02/24 2004 07/03/24 0230 07/03/24 0507  WBC 11.6*  --  12.1*  HGB 12.2*  11.6* 10.2* 10.9*  HCT 35.9*  34.0* 30.0* 32.1*  MCV 93.7  --  94.1  PLT 153  --  165   Basic Metabolic Panel Recent Labs    89/92/74 2004 07/03/24 0230 07/03/24 0507  NA 139  141 138 135  K 4.3  4.4 4.2 4.1  CL 110  --  103  CO2 24  --  22  GLUCOSE 135*  --  119*  BUN 12  --  12  CREATININE 1.03  --  1.03  CALCIUM  8.0*  --  7.7*  MG 3.0*  --  2.3   Liver Function Tests Recent Labs    07/01/24 1900  AST 23  ALT 26  ALKPHOS 69  BILITOT 0.7  PROT 6.2*  ALBUMIN 3.6   No results for input(s):  LIPASE, AMYLASE in the last 72 hours. Cardiac Enzymes No results for input(s): CKTOTAL, CKMB, CKMBINDEX, TROPONINI in the last 72 hours.  BNP: BNP (last 3 results) Recent Labs    07/01/24 1403  BNP 19.4    ProBNP (last 3 results) No results for input(s): PROBNP in the last 8760 hours.   D-Dimer No results for input(s): DDIMER in the last 72 hours. Hemoglobin A1C Recent Labs    07/01/24 1900  HGBA1C 5.4   Fasting Lipid Panel Recent Labs    07/02/24 0437  CHOL 103  HDL 43  LDLCALC 44  TRIG 78  CHOLHDL 2.4   Thyroid Function Tests No results for input(s): TSH, T4TOTAL, T3FREE, THYROIDAB in the last 72 hours.  Invalid input(s): FREET3  Other results:   Imaging    DG Chest Port 1 View Result Date: 07/03/2024 CLINICAL DATA:  Postop from aortic valve replacement and CABG. EXAM: PORTABLE CHEST 1 VIEW COMPARISON:  07/02/2024 FINDINGS: Endotracheal tube and nasogastric tube have been removed since  previous study. Right jugular central venous catheter remains in place. No pneumothorax visualized. Increased mild bibasilar atelectasis noted. Heart size remains stable. Prior CABG and aortic valve replacement again seen. IMPRESSION: Increased mild bibasilar atelectasis. No pneumothorax visualized. Electronically Signed   By: Norleen DELENA Kil M.D.   On: 07/03/2024 06:16   DG Chest Port 1 View Result Date: 07/02/2024 CLINICAL DATA:  Status post CABG and aortic valve replacement. EXAM: PORTABLE CHEST 1 VIEW COMPARISON:  07/01/2024 FINDINGS: Endotracheal tube in satisfactory position. Nasogastric tube extending into the stomach with its tip and side hole in the mid stomach. Interval post CABG changes and prosthetic aortic valve. Right jugular catheter tip in the proximal superior vena cava. No pneumothorax. Minimal pneumomediastinum. Interval mild bibasilar atelectasis. Old, healed right rib fractures. IMPRESSION: 1. Interval post CABG changes and prosthetic aortic  valve. 2. Minimal pneumomediastinum. 3. Mild bibasilar atelectasis. Electronically Signed   By: Elspeth Bathe M.D.   On: 07/02/2024 15:25     Medications:     Scheduled Medications:  acetaminophen   1,000 mg Oral Q6H   Or   acetaminophen  (TYLENOL ) oral liquid 160 mg/5 mL  1,000 mg Per Tube Q6H   aspirin  EC  325 mg Oral Daily   Or   aspirin   324 mg Per Tube Daily   atorvastatin   40 mg Oral Daily   bisacodyl   10 mg Oral Daily   Or   bisacodyl   10 mg Rectal Daily   Chlorhexidine  Gluconate Cloth  6 each Topical Daily   docusate sodium   200 mg Oral Daily   enoxaparin (LOVENOX) injection  40 mg Subcutaneous Q24H   metoCLOPramide  (REGLAN ) injection  10 mg Intravenous Q6H   metoprolol  tartrate  12.5 mg Oral BID   Or   metoprolol  tartrate  12.5 mg Per Tube BID   mupirocin ointment  1 Application Nasal BID   [START ON 07/04/2024] pantoprazole  40 mg Oral Daily   pantoprazole (PROTONIX) IV  40 mg Intravenous QHS   sodium chloride  flush  3 mL Intravenous Q12H    Infusions:  sodium chloride  20 mL/hr at 07/03/24 0800   sodium chloride      sodium chloride      albumin human 999 mL/hr at 07/03/24 0800    ceFAZolin  (ANCEF ) IV Stopped (07/03/24 0703)   clevidipine     dexmedetomidine (PRECEDEX) IV infusion Stopped (07/03/24 9374)   insulin  0.9 Units/hr (07/03/24 0800)   lactated ringers      lactated ringers  20 mL/hr at 07/03/24 0800   norepinephrine (LEVOPHED) Adult infusion 4 mcg/min (07/03/24 0800)    PRN Medications: sodium chloride , albumin human, dextrose , metoprolol  tartrate, midazolam , morphine  injection, ondansetron  (ZOFRAN ) IV, oxyCODONE , polyethylene glycol, sodium chloride  flush, traMADol    Patient Profile  63 y.o. male with severe aortic stenosis, HTN, DB, OSA and CAD. Admitted following right heart catheterization. Right heart catheterization showed normal cardiac index, RV function by pulmonary artery pulsatility index, and echocardiogram showing normal RV size and  function. Appears well optimized from cardiac standpoint.   Scheduled CABG AVR   Assessment/Plan   1. S/P CABG x2 AVR  Preop RHC-stable hemodynamics.  Preop Echo -LVEF 45-50% RV normal . Severe AS Extubated on room air Give 40 mg IV lasix  now.  Continue aspirin + atorvastin.    2. SVT Placed  on amio drip intraoperatively  Resolved.   HF Team will sign off.   Length of Stay: 2  Greig Mosses, NP  07/03/2024, 9:42 AM  Advanced Heart Failure Team Pager [phone] (M-F;  7a - 5p)  Please contact [facility] for night-coverage after hours (5p -7a ) and weekends on [email or web address]

## 2024-07-03 NOTE — Progress Notes (Signed)
 Initial Nutrition Assessment  DOCUMENTATION CODES:   Not applicable  INTERVENTION:   Encourage small, frequent meals.Discussed addition of scheduled snacks between meals vs oral nutrition supplement with pt. Pt would like supplement between meals  Ensure Plus High Protein po BID, each supplement provides 350 kcal and 20 grams of protein  MVI with Minerals  NUTRITION DIAGNOSIS:   Inadequate oral intake related to decreased appetite as evidenced by per patient/family report.  GOAL:   Patient will meet greater than or equal to 90% of their needs  MONITOR:   PO intake, Supplement acceptance  REASON FOR ASSESSMENT:   Consult Assessment of nutrition requirement/status  ASSESSMENT:   63 yo male admitted for CABG x 2, AVR with hx severe aortic stenosis. PMH includes hx of lap gastric banding in 2008, DM, HTN, OSA, CAD  10/07 CABG x 2, AVR, Extubated in the evening  Off drips this AM.   Not much of an appetite currently but eating well PTA. NPO this AM, diet just advanced for lunch.   Pt typically eats 4 times per day at baseline.  Breakfast: Cheerios or Rasin Bran with Milk, maybe a piece of toast and black coffee Lunch: usually a pack of NABS Dinner: varies but recent examples include homemade chili, eggs with cheese Evening Snack:  usually a treat   Pt reports they usually avoid fried foods, does not add salt to foods.   Pt takes a MVI (one a day) at home; not currently using protein shakes but has in the past.   Weight is up post-op as to be expected. Weight 112.1 kg (247 pounds), wt yesterday 103.7 (229 pounds)  Pt reports he weighed around 420 pounds prior to Lap Band Surgery in 2008. Weight down to around 250 pounds where it stayed steady for a while. More recently, pt has been on Mounjaro and current wt around 225-227 pounds. Pt believes weight loss has been gradual. Last dose of Mounjaro was 2 weeks PTA.   Labs: Reviewed  Meds:  Dulcolax Colace SS  novolog  Reglan  x 6 doses Protonix    NUTRITION - FOCUSED PHYSICAL EXAM:  Flowsheet Row Most Recent Value  Orbital Region No depletion  Upper Arm Region No depletion  Thoracic and Lumbar Region No depletion  Buccal Region No depletion  Temple Region No depletion  Clavicle Bone Region No depletion  Clavicle and Acromion Bone Region No depletion  Scapular Bone Region No depletion  Dorsal Hand Unable to assess  Patellar Region No depletion  Anterior Thigh Region No depletion  Posterior Calf Region No depletion  Edema (RD Assessment) Mild    Diet Order:   Diet Order             Diet heart healthy/carb modified Room service appropriate? Yes; Fluid consistency: Thin  Diet effective now                   EDUCATION NEEDS:   Education needs have been addressed  Skin:  Skin Assessment: Skin Integrity Issues: Skin Integrity Issues:: Incisions Incisions: chest (closed), leg (closed)  Last BM:  No BM yet, bowel regimen is ordered  Height:   Ht Readings from Last 1 Encounters:  07/02/24 5' 9 (1.753 m)    Weight:   Wt Readings from Last 1 Encounters:  07/03/24 112.1 kg     BMI:  Body mass index is 36.5 kg/m.  Estimated Nutritional Needs:   Kcal:  1800-2000 kcals  Protein:  110-130 g  Fluid:  >/= 1.8  L    Betsey Finger MS, RDN, LDN, CNSC Registered Dietitian 3 Clinical Nutrition RD Inpatient Contact Info in Amion

## 2024-07-04 ENCOUNTER — Inpatient Hospital Stay (HOSPITAL_COMMUNITY)

## 2024-07-04 ENCOUNTER — Other Ambulatory Visit: Payer: Self-pay | Admitting: Student

## 2024-07-04 DIAGNOSIS — Z952 Presence of prosthetic heart valve: Secondary | ICD-10-CM

## 2024-07-04 LAB — BASIC METABOLIC PANEL WITH GFR
Anion gap: 11 (ref 5–15)
BUN: 13 mg/dL (ref 8–23)
CO2: 24 mmol/L (ref 22–32)
Calcium: 8 mg/dL — ABNORMAL LOW (ref 8.9–10.3)
Chloride: 99 mmol/L (ref 98–111)
Creatinine, Ser: 1.2 mg/dL (ref 0.61–1.24)
GFR, Estimated: >60 mL/min (ref >60)
Glucose, Bld: 141 mg/dL — ABNORMAL HIGH (ref 70–99)
Potassium: 3.7 mmol/L (ref 3.5–5.1)
Sodium: 134 mmol/L — ABNORMAL LOW (ref 135–145)

## 2024-07-04 LAB — GLUCOSE, CAPILLARY
Glucose-Capillary: 136 mg/dL — ABNORMAL HIGH (ref 70–99)
Glucose-Capillary: 147 mg/dL — ABNORMAL HIGH (ref 70–99)
Glucose-Capillary: 143 mg/dL — ABNORMAL HIGH (ref 70–99)
Glucose-Capillary: 158 mg/dL — ABNORMAL HIGH (ref 70–99)
Glucose-Capillary: 152 mg/dL — ABNORMAL HIGH (ref 70–99)

## 2024-07-04 LAB — CBC
HCT: 32.2 % — ABNORMAL LOW (ref 39.0–52.0)
Hemoglobin: 10.8 g/dL — ABNORMAL LOW (ref 13.0–17.0)
MCH: 32 pg (ref 26.0–34.0)
MCHC: 33.5 g/dL (ref 30.0–36.0)
MCV: 95.5 fL (ref 80.0–100.0)
Platelets: 134 K/uL — ABNORMAL LOW (ref 150–400)
RBC: 3.37 MIL/uL — ABNORMAL LOW (ref 4.22–5.81)
RDW: 12.5 % (ref 11.5–15.5)
WBC: 12.1 K/uL — ABNORMAL HIGH (ref 4.0–10.5)
nRBC: 0 % (ref 0.0–0.2)

## 2024-07-04 LAB — MISC LABCORP TEST (SEND OUT): Labcorp test code: 83935

## 2024-07-04 MED ORDER — SODIUM CHLORIDE 0.9% FLUSH
3.0000 mL | Freq: Two times a day (BID) | INTRAVENOUS | Status: DC
  Administered 2024-07-04: 3 mL via INTRAVENOUS

## 2024-07-04 MED ORDER — SODIUM CHLORIDE 0.9% FLUSH: 3.0000 mL | INTRAVENOUS | Status: DC | PRN

## 2024-07-04 MED ORDER — INSULIN ASPART 100 UNIT/ML IJ SOLN
0.0000 [IU] | Freq: Three times a day (TID) | INTRAMUSCULAR | Status: DC
  Administered 2024-07-04 – 2024-07-06 (×5): 2 [IU] via SUBCUTANEOUS
  Administered 2024-07-06: 4 [IU] via SUBCUTANEOUS

## 2024-07-04 MED ORDER — INSULIN ASPART 100 UNIT/ML IJ SOLN
0.0000 [IU] | Freq: Three times a day (TID) | INTRAMUSCULAR | Status: DC
  Administered 2024-07-04: 2 [IU] via SUBCUTANEOUS

## 2024-07-04 MED ORDER — POTASSIUM CHLORIDE CRYS ER 20 MEQ PO TBCR
40.0000 meq | EXTENDED_RELEASE_TABLET | Freq: Once | ORAL | Status: AC
  Administered 2024-07-04: 40 meq via ORAL
  Filled 2024-07-04: qty 2

## 2024-07-04 MED ORDER — FENTANYL CITRATE (PF) 100 MCG/2ML IJ SOLN: 25.0000 ug | Freq: Once | INTRAMUSCULAR | Status: DC

## 2024-07-04 MED ORDER — SODIUM CHLORIDE 0.9 % IV SOLN: 250.0000 mL | INTRAVENOUS | Status: DC | PRN

## 2024-07-04 MED ORDER — ~~LOC~~ CARDIAC SURGERY, PATIENT & FAMILY EDUCATION
Freq: Once | Status: AC
  Administered 2024-07-04: 1

## 2024-07-04 MED ORDER — POLYETHYLENE GLYCOL 3350 17 G PO PACK
17.0000 g | PACK | Freq: Once | ORAL | Status: AC
  Administered 2024-07-04: 17 g via ORAL
  Filled 2024-07-04: qty 1

## 2024-07-04 MED ORDER — POTASSIUM CHLORIDE CRYS ER 20 MEQ PO TBCR
40.0000 meq | EXTENDED_RELEASE_TABLET | Freq: Every day | ORAL | Status: DC
  Administered 2024-07-04 – 2024-07-05 (×2): 40 meq via ORAL
  Filled 2024-07-04 (×2): qty 2

## 2024-07-04 MED ORDER — DIPHENHYDRAMINE HCL 25 MG PO CAPS
25.0000 mg | ORAL_CAPSULE | Freq: Three times a day (TID) | ORAL | Status: DC | PRN
Start: 2024-07-04 — End: 2024-07-07
  Administered 2024-07-04 – 2024-07-05 (×4): 25 mg via ORAL
  Filled 2024-07-04 (×4): qty 1

## 2024-07-04 MED ORDER — FUROSEMIDE 40 MG PO TABS
40.0000 mg | ORAL_TABLET | Freq: Every day | ORAL | Status: DC
  Administered 2024-07-04 – 2024-07-05 (×2): 40 mg via ORAL
  Filled 2024-07-04 (×2): qty 1

## 2024-07-04 MED ORDER — INSULIN ASPART 100 UNIT/ML IJ SOLN: 0.0000 [IU] | Freq: Every day | INTRAMUSCULAR | Status: DC

## 2024-07-04 MED ORDER — FENTANYL CITRATE (PF) 50 MCG/ML IJ SOSY
25.0000 ug | PREFILLED_SYRINGE | Freq: Once | INTRAMUSCULAR | Status: AC
  Administered 2024-07-04: 25 ug via INTRAVENOUS
  Filled 2024-07-04: qty 1

## 2024-07-04 NOTE — Plan of Care (Signed)

## 2024-07-04 NOTE — Progress Notes (Signed)
 NAME:  Tim Cook, MRN:  [id number], DOB:  Feb 22, 1961, LOS: 3 ADMISSION DATE:  07/01/2024, CONSULTATION DATE:  10/7 REFERRING MD:  Shyrl MA CHIEF COMPLAINT:  s/p AVR, CABG   History of Present Illness:  63 year old male with past medical history of arthritis, chronic back pain, type 2 diabetes, GERD, chronic bronchitis, hypertension, hyperlipidemia, CAD, severe aortic stenosis who presents to [facility] for AVR and CABG.   He was seen by cardiology 03/2024 after echo in June showing severe aortic stenosis, EF 60-65%, G1DD. Concern he was downplaying symptoms and cardiology felt appropriate to proceed with cardiac cath. On 05/14/2024 he had R/LHC showing RCA 99%, , cx 80%, PDA 70%, severe aortic valve stenosis. He was referred to [facility] for AVR and CABG consideration.   He had repeated RHC yesterday 10/6 showing normal L and R filling pressures. Repeat echo 10/6 showing EF 45-50%, global hypokinesis, G1DD.   Pump time: 2h 45m Xclamp time: 2h 58m    Pertinent  Medical History  arthritis, chronic back pain, type 2 diabetes, GERD, chronic bronchitis, hypertension, hyperlipidemia, CAD, severe aortic stenosis  Significant Hospital Events: Including procedures, antibiotic start and stop dates in addition to other pertinent events   10/7: admit ICU s/p AVR and CABG x 2 RSVG OM2 and 3  Interim History / Subjective:  Overnight had some agitation after getting oxycodone . Pain is well controlled. Slept well. No BM since surgery.   Objective   Blood pressure (!) 113/55, pulse (!) 103, temperature 98.9 F (37.2 C), temperature source Oral, resp. rate 12, height 5' 9 (1.753 m), weight 112.3 kg, SpO2 92%. CVP:  [0 mmHg-45 mmHg] 8 mmHg CO:  [4.1 L/min-11.9 L/min] 10.7 L/min CI:  [1.9 L/min/m2-5.4 L/min/m2] 4.9 L/min/m2      Intake/Output Summary (Last 24 hours) at 07/04/2024 0846 Last data filed at 07/04/2024 0600 Gross per 24 hour  Intake 955.59 ml  Output 2685 ml  Net -1729.41 ml   Filed  Weights   07/02/24 0638 07/03/24 0353 07/04/24 0500  Weight: 103.7 kg 112.1 kg 112.3 kg    Examination: General: middle aged man sitting up in the chair in NAD HENT: [place]/AT, eyes anicteric Lungs: breathing comfortably on RA, CTAB Cardiovascular: S1S2, RRR. Chest tubes with minimal output. Abdomen: soft, NT Extremities: mild edema Neuro: awake, alert, moving all extremities, answering questions appropriately   Na+ 134 BUN 12 Cr 1.03 WBC 12.1 H/H 10.9/32.1 Platelets 165 CXR personally reviewed> RIJ CVC, chest tubes, no infiltrates or effusions  Resolved Hospital Problem list    Assessment & Plan:  Multivessel CAD s/p CABG x2 RSVG to OM2 and 3 Severe aortic stenosis s/p aortic valve replacement  HFmpEF, new  Post-op vasoplegia; resolved R/LHC showing RCA 99%,  Lcx 80%, PDA 70%, severe aortic valve stenosis. RHC  yesterday 10/6 showing normal L and R filling pressures. Repeat echo 10/6 showing EF 45-50%, global hypokinesis, G1DD. On Farxiga , losartan  at home -post-op care per [facility] -d/c chest tubes and CVC today -metoprolol , aspirin , statin -progress mobility and diet -pain control per protocol  Expected post-operative ABLA Expected post-operative consumptive thrombocytopenia  -monitor, no need for transfusions currently   Hypertension  Hyperlipidemia  -con't statin -hold PTA losartan  for now  Diabetes; A1c 5.4 -con't to hold PTA metformin , mounjaro  -SSI PRN -goal BG <180  GERD -PPI  Mild hyponatremia -limit free water , encourage PO intake  Arthritis  Chronic back pain  Neuropathic pain -con't zaanflex and gabapentin   H/o lap band surgery -monitor for intolerance; so  far doing well  H/o OSA -CPAP QHS  Stable to transfer out of the ICU. PCCM will be available as needed.    Labs   CBC: Recent Labs  Lab 07/02/24 1425 07/02/24 2004 07/03/24 0230 07/03/24 0507 07/03/24 1633 07/04/24 0332  WBC 13.9* 11.6*  --  12.1* 13.4* 12.1*  HGB 12.9*  11.2*  12.2*  11.6* 10.2* 10.9* 11.6* 10.8*  HCT 38.0*  33.0* 35.9*  34.0* 30.0* 32.1* 34.3* 32.2*  MCV 93.6 93.7  --  94.1 94.8 95.5  PLT 165 153  --  165 165 134*    Basic Metabolic Panel: Recent Labs  Lab 07/02/24 0437 07/02/24 0816 07/02/24 1306 07/02/24 1425 07/02/24 2004 07/03/24 0230 07/03/24 0507 07/03/24 1633 07/04/24 0332  NA 137   < > 138   < > 139  141 138 135 132* 134*  K 4.2   < > 4.1   < > 4.3  4.4 4.2 4.1 4.1 3.7  CL 103   < > 102  --  110  --  103 97* 99  CO2 25  --   --   --  24  --  22 25 24   GLUCOSE 110*   < > 177*  --  135*  --  119* 188* 141*  BUN 14   < > 14  --  12  --  12 13 13   CREATININE 1.12   < > 0.90  --  1.03  --  1.03 1.26* 1.20  CALCIUM  8.9  --   --   --  8.0*  --  7.7* 8.0* 8.0*  MG  --   --   --   --  3.0*  --  2.3 2.0  --    < > = values in this interval not displayed.   GFR: Estimated Creatinine Clearance: 78.8 mL/min (by C-G formula based on SCr of 1.2 mg/dL). Recent Labs  Lab 07/02/24 2004 07/03/24 0507 07/03/24 1633 07/04/24 0332  WBC 11.6* 12.1* 13.4* 12.1*    Liver Function Tests: Recent Labs  Lab 07/01/24 1900  AST 23  ALT 26  ALKPHOS 69  BILITOT 0.7  PROT 6.2*  ALBUMIN 3.6   Critical care time:       Leita SHAUNNA Gaskins, DO 07/04/24 8:54 AM [facility]  For contact information, see Amion. If no response to pager, please call PCCM consult pager. After hours, 7PM- 7AM, please call Elink.

## 2024-07-04 NOTE — Progress Notes (Signed)
 [address]       [place] [address]             [phone]                 2 Days Post-Op Procedure(s) (LRB): REPLACEMENT, AORTIC VALVE, OPEN USING INSPIRIS (N/A) CORONARY ARTERY BYPASS GRAFTING (CABG) X2 USING ENDOSCOPICALLY HARVESTED RIGHT GREATER SAPHENOUS VEIN (N/A) ECHOCARDIOGRAM, TRANSESOPHAGEAL, INTRAOPERATIVE (N/A)   Events: No events _______________________________________________________________ Vitals: BP 127/63 (BP Location: Right Arm)   Pulse 97   Temp 98.9 F (37.2 C) (Oral)   Resp (!) 26   Ht 5' 9 (1.753 m)   Wt 112.3 kg   SpO2 95%   BMI 36.55 kg/m  Filed Weights   07/02/24 0638 07/03/24 0353 07/04/24 0500  Weight: 103.7 kg 112.1 kg 112.3 kg     - Neuro: alert NAd  - Cardiovascular: sinus  Drips: none.   CVP:  [0 mmHg-42 mmHg] 8 mmHg CO:  [5.4 L/min-11.9 L/min] 10.7 L/min CI:  [2.5 L/min/m2-5.4 L/min/m2] 4.9 L/min/m2  - Pulm: EWOB    ABG    Component Value Date/Time   PHART 7.367 07/03/2024 0230   PCO2ART 38.8 07/03/2024 0230   PO2ART 95 07/03/2024 0230   HCO3 22.2 07/03/2024 0230   TCO2 23 07/03/2024 0230   ACIDBASEDEF 3.0 (H) 07/03/2024 0230   O2SAT 97 07/03/2024 0230    - Abd: ND - Extremity: warm  .Intake/Output      10/08 0701 10/09 0700 10/09 0701 10/10 0700   P.O. 480 120   I.V. (mL/kg) 439.2 (3.9)    Blood     Other 0    IV Piggyback 316.7    Total Intake(mL/kg) 1235.9 (11) 120 (1.1)   Urine (mL/kg/hr) 2550 (0.9) 200 (0.5)   Emesis/NG output     Blood     Chest Tube 200 0   Total Output 2750 200   Net -1514.1 -80        Urine Occurrence  1 x      _______________________________________________________________ Labs:    Latest Ref Rng & Units 07/04/2024    3:32 AM 07/03/2024    4:33 PM 07/03/2024    5:07 AM  CBC  WBC 4.0 - 10.5 K/uL 12.1  13.4  12.1   Hemoglobin 13.0 - 17.0 g/dL 89.1  88.3  89.0   Hematocrit 39.0 - 52.0 % 32.2  34.3  32.1   Platelets 150 - 400 K/uL 134  165   165       Latest Ref Rng & Units 07/04/2024    3:32 AM 07/03/2024    4:33 PM 07/03/2024    5:07 AM  CMP  Glucose 70 - 99 mg/dL 858  811  880   BUN 8 - 23 mg/dL 13  13  12    Creatinine 0.61 - 1.24 mg/dL 8.79  8.73  8.96   Sodium 135 - 145 mmol/L 134  132  135   Potassium 3.5 - 5.1 mmol/L 3.7  4.1  4.1   Chloride 98 - 111 mmol/L 99  97  103   CO2 22 - 32 mmol/L 24  25  22    Calcium  8.9 - 10.3 mg/dL 8.0  8.0  7.7     CXR: stable  _______________________________________________________________  Assessment and Plan: POD 2 s/p AVR  Neuro: pain controlled CV: on A/S/BB Pulm: IS, ambulation Renal: creat stable.  Will diurese GI: on diet Heme: stable ID: [email or web address] Endo: SSI  Dispo: floor   Tim Cook 07/04/2024 10:56 AM

## 2024-07-04 NOTE — Progress Notes (Signed)
 Ordered routine 6 week post-op Echo following AVR on 07/02/2024 per protocol.  Primary Cardiologist: Dr. Bernie CT Surgeon: Dr. Shyrl.  Akosua Constantine E Rieley Khalsa, PA-C 07/04/2024 11:27 AM

## 2024-07-05 ENCOUNTER — Telehealth: Payer: Self-pay | Admitting: Family Medicine

## 2024-07-05 LAB — CBC
HCT: 31.2 % — ABNORMAL LOW (ref 39.0–52.0)
Hemoglobin: 10.4 g/dL — ABNORMAL LOW (ref 13.0–17.0)
MCH: 31.4 pg (ref 26.0–34.0)
MCHC: 33.3 g/dL (ref 30.0–36.0)
MCV: 94.3 fL (ref 80.0–100.0)
Platelets: 146 K/uL — ABNORMAL LOW (ref 150–400)
RBC: 3.31 MIL/uL — ABNORMAL LOW (ref 4.22–5.81)
RDW: 12.5 % (ref 11.5–15.5)
WBC: 10.9 K/uL — ABNORMAL HIGH (ref 4.0–10.5)
nRBC: 0 % (ref 0.0–0.2)

## 2024-07-05 LAB — BPAM RBC
Blood Product Expiration Date: 202510312359
Blood Product Expiration Date: 202511022359
ISSUE DATE / TIME: 202510020829
ISSUE DATE / TIME: 202510061350
Unit Type and Rh: 5100
Unit Type and Rh: 5100

## 2024-07-05 LAB — TYPE AND SCREEN
ABO/RH(D): O POS
Antibody Screen: NEGATIVE
Unit division: 0
Unit division: 0

## 2024-07-05 LAB — GLUCOSE, CAPILLARY
Glucose-Capillary: 117 mg/dL — ABNORMAL HIGH (ref 70–99)
Glucose-Capillary: 142 mg/dL — ABNORMAL HIGH (ref 70–99)
Glucose-Capillary: 130 mg/dL — ABNORMAL HIGH (ref 70–99)
Glucose-Capillary: 138 mg/dL — ABNORMAL HIGH (ref 70–99)

## 2024-07-05 LAB — BASIC METABOLIC PANEL WITH GFR
Anion gap: 9 (ref 5–15)
BUN: 14 mg/dL (ref 8–23)
CO2: 25 mmol/L (ref 22–32)
Calcium: 8.3 mg/dL — ABNORMAL LOW (ref 8.9–10.3)
Chloride: 100 mmol/L (ref 98–111)
Creatinine, Ser: 1.22 mg/dL (ref 0.61–1.24)
GFR, Estimated: >60 mL/min (ref >60)
Glucose, Bld: 138 mg/dL — ABNORMAL HIGH (ref 70–99)
Potassium: 4.9 mmol/L (ref 3.5–5.1)
Sodium: 134 mmol/L — ABNORMAL LOW (ref 135–145)

## 2024-07-05 MED ORDER — POLYETHYLENE GLYCOL 3350 17 G PO PACK: 17.0000 g | PACK | Freq: Every day | ORAL | Status: DC | PRN

## 2024-07-05 MED ORDER — POTASSIUM CHLORIDE CRYS ER 20 MEQ PO TBCR
20.0000 meq | EXTENDED_RELEASE_TABLET | Freq: Every day | ORAL | Status: DC
  Administered 2024-07-06 – 2024-07-07 (×2): 20 meq via ORAL
  Filled 2024-07-05 (×2): qty 1

## 2024-07-05 MED ORDER — METOPROLOL TARTRATE 25 MG PO TABS: 25.0000 mg | ORAL_TABLET | Freq: Two times a day (BID) | ORAL | Status: DC

## 2024-07-05 MED ORDER — METOPROLOL TARTRATE 12.5 MG HALF TABLET
12.5000 mg | ORAL_TABLET | Freq: Once | ORAL | Status: AC
  Administered 2024-07-05: 12.5 mg via ORAL
  Filled 2024-07-05: qty 1

## 2024-07-05 MED ORDER — METOPROLOL TARTRATE 25 MG PO TABS
25.0000 mg | ORAL_TABLET | Freq: Two times a day (BID) | ORAL | Status: DC
  Administered 2024-07-05: 25 mg via ORAL
  Filled 2024-07-05: qty 1

## 2024-07-05 MED ORDER — LACTULOSE 10 GM/15ML PO SOLN
20.0000 g | Freq: Two times a day (BID) | ORAL | Status: DC | PRN
  Administered 2024-07-05: 20 g via ORAL
  Filled 2024-07-05: qty 30

## 2024-07-05 MED FILL — Electrolyte-R (PH 7.4) Solution: INTRAVENOUS | Qty: 6000 | Status: AC

## 2024-07-05 MED FILL — Lidocaine HCl Local Preservative Free (PF) Inj 2%: INTRAMUSCULAR | Qty: 14 | Status: AC

## 2024-07-05 MED FILL — Sodium Chloride IV Soln 0.9%: INTRAVENOUS | Qty: 2000 | Status: AC

## 2024-07-05 MED FILL — Mannitol IV Soln 20%: INTRAVENOUS | Qty: 500 | Status: AC

## 2024-07-05 MED FILL — Calcium Chloride Inj 10%: INTRAVENOUS | Qty: 10 | Status: AC

## 2024-07-05 MED FILL — Albumin, Human Inj 5%: INTRAVENOUS | Qty: 250 | Status: AC

## 2024-07-05 MED FILL — Sodium Bicarbonate IV Soln 8.4%: INTRAVENOUS | Qty: 50 | Status: AC

## 2024-07-05 MED FILL — Heparin Sodium (Porcine) Inj 1000 Unit/ML: INTRAMUSCULAR | Qty: 20 | Status: AC

## 2024-07-05 NOTE — Discharge Instructions (Signed)

## 2024-07-05 NOTE — Plan of Care (Signed)
   Problem: Clinical Measurements: Goal: Ability to maintain clinical measurements within normal limits will improve Outcome: Progressing Goal: Will remain free from infection Outcome: Progressing

## 2024-07-05 NOTE — Discharge Summary (Signed)
 [address] [place] [address]             [phone]        Physician Discharge Summary  Patient ID: Tim Cook MRN: [id number] DOB/AGE: [birth year] 64 y.o.  Admit date: 07/01/2024 Discharge date: 07/07/2024  Admission Diagnoses:  Patient Active Problem List   Diagnosis Date Noted   Severe aortic stenosis 07/01/2024   Right foot pain 04/30/2024   Aortic valve disorder 04/22/2024   Encounter for current long term use of antiplatelet drug 04/22/2024   Snoring 04/10/2024   History of colonic polyps 04/10/2024   Diverticular disease of colon 04/10/2024   Aortic stenosis 02/05/2024   Obesity (BMI 35.0-39.9 without comorbidity) 12/26/2023   Chest pain, atypical 12/26/2023   CAD (coronary artery disease), native coronary artery    Hyperlipidemia    Type II diabetes mellitus (HCC)    History of nutritional deficiency 10/26/2021   Radiculopathy, lumbar region 04/07/2021   Lumbago with sciatica, right side 04/07/2021   Degenerative lumbar spinal stenosis 04/07/2021   Benign essential hypertension 01/28/2021   Encounter for immunization 01/28/2021   Erectile dysfunction 01/28/2021   Gastro-esophageal reflux 01/28/2021   Hyperglycemia due to type 2 diabetes mellitus (HCC) 01/28/2021   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 type 2 diabetes mellitus (HCC) 01/28/2021   Shoulder joint pain 01/28/2021   Vitamin D  deficiency 01/28/2021   Diabetes mellitus without complication (HCC)    DDD (degenerative disc disease), lumbar    Chronic lower back pain    Chronic bronchitis (HCC)    Arthritis    Closed fracture of right proximal humerus 03/12/2020   Closed fracture of right distal radius 03/12/2020   Dislocation of left middle finger 01/21/2019   Bursitis of right shoulder 01/21/2019   Degenerative arthritis of right knee 05/09/2017   Nonallopathic lesion of lumbosacral region 07/07/2016    Nonallopathic lesion of sacral region 07/07/2016   Nonallopathic lesion of thoracic region 07/07/2016   Degenerative disc disease, lumbar 07/07/2016   Acute lateral meniscal tear 06/09/2016   Bariatric surgery status 05/18/2016   Mixed hyperlipidemia 05/18/2016   Unstable angina (HCC)    Atherosclerotic heart disease of native coronary artery without angina pectoris    Hypertensive heart disease without CHF    Obesity    Discharge Diagnoses:  Patient Active Problem List   Diagnosis Date Noted   S/P AVR (aortic valve replacement) 07/02/2024   Severe aortic stenosis 07/01/2024   Right foot pain 04/30/2024   Aortic valve disorder 04/22/2024   Encounter for current long term use of antiplatelet drug 04/22/2024   Snoring 04/10/2024   History of colonic polyps 04/10/2024   Diverticular disease of colon 04/10/2024   Aortic stenosis 02/05/2024   Obesity (BMI 35.0-39.9 without comorbidity) 12/26/2023   Chest pain, atypical 12/26/2023   CAD (coronary artery disease), native coronary artery    Hyperlipidemia    Type II diabetes mellitus (HCC)    History of nutritional deficiency 10/26/2021   Radiculopathy, lumbar region 04/07/2021   Lumbago with sciatica, right side 04/07/2021   Degenerative lumbar spinal stenosis 04/07/2021   Benign essential hypertension 01/28/2021   Encounter for immunization 01/28/2021   Erectile dysfunction 01/28/2021   Gastro-esophageal reflux 01/28/2021   Hyperglycemia due to type 2 diabetes mellitus (HCC) 01/28/2021   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 type 2 diabetes mellitus (HCC) 01/28/2021   Shoulder joint  pain 01/28/2021   Vitamin D  deficiency 01/28/2021   Diabetes mellitus without complication (HCC)    DDD (degenerative disc disease), lumbar    Chronic lower back pain    Chronic bronchitis (HCC)    Arthritis    Closed fracture of right proximal humerus 03/12/2020   Closed fracture of right distal radius  03/12/2020   Dislocation of left middle finger 01/21/2019   Bursitis of right shoulder 01/21/2019   Degenerative arthritis of right knee 05/09/2017   Nonallopathic lesion of lumbosacral region 07/07/2016   Nonallopathic lesion of sacral region 07/07/2016   Nonallopathic lesion of thoracic region 07/07/2016   Degenerative disc disease, lumbar 07/07/2016   Acute lateral meniscal tear 06/09/2016   Bariatric surgery status 05/18/2016   Mixed hyperlipidemia 05/18/2016   Unstable angina (HCC)    Atherosclerotic heart disease of native coronary artery without angina pectoris    Hypertensive heart disease without CHF    Obesity      Discharged Condition: stable  History of Present Illness:  Tim Cook is a 63 y.o. male who presents for surgical evaluation of severe aortic stenosis and coronary artery disease.  He has a hx of PCI in 2017, and recently he developed chest pain after visiting with friends.  He subsequently underwent further evaluation which identified a heavily calcified aortic valve, with measured gradients in the severe range, circumflex disease, and LV dysfunction.     He denies any chest pain, shortness of breath, or syncopal episodes.  He does have neuropathy in his lower extremities and weakness in his right arm.  He has also had a lap band in the past, but denies any dysphagia.    He has undergone complete diagnostic evaluation including left and right heart catheterization, echocardiogram and MRI and is felt to be candidate for aortic valve replacement and CABG.  Hospital course:  Patient was admitted for right heart catheterization on 07/01/2024 to complete the diagnostic evaluation.  He was felt to be medically stable and on 07/02/2024 he was taken the operating room at which time he underwent CABG x 2 as well as aortic valve replacement with a 25 Inspiris Resilia bioprosthetic.  He tolerated the procedure well and was taken to the surgical intensive care unit in stable  condition.  Postoperative hospital course: He was extubated without difficulty using standard postcardiac surgical protocols.  He has required some neo for blood pressure support due to postop vasoplegia but maintained good hemodynamics. Neo was weaned early postoperatively. He was routinely diuresed. He does have an expected acute blood loss anemia which is minor/not clinically significant and being monitored clinically. Epicardial pacing wires and chest tubes were removed without complication on POD2. He was felt stable for transfer to the progressive unit. He developed sinus tachyardia, Lopressor  was titrated. He was ambulating well on room air and his bowels began moving appropriately on 10/10. He was aggressively diuresed. Home Metformin  was restarted once he was tolerating a diet. Home Farxiga  and Mounjaro were restarted at discharge. He would be discharged on 10 days of PO Lasix . His incisions were healing well without sign of infection. He was felt stable for discharge home.   Consults: pulmonary/intensive care  Significant Diagnostic Studies:  DG Chest Port 1 View Result Date: 07/04/2024 CLINICAL DATA:  Follow-up bibasilar atelectasis. EXAM: PORTABLE CHEST 1 VIEW COMPARISON:  07/03/2024 FINDINGS: Stable enlarged cardiac silhouette, post CABG changes and prosthetic aortic valve. Right jugular catheter tip in the proximal superior vena cava. No pneumothorax. Stable tubing overlying  the heart. Mild left basilar atelectasis with improvement. Minimal right basilar atelectasis with improvement. Thoracic spine degenerative changes and proximal right humerus fixation hardware. IMPRESSION: Mild left and minimal right basilar atelectasis with improvement. Electronically Signed   By: Elspeth Bathe M.D.   On: 07/04/2024 11:03   ECHO INTRAOPERATIVE TEE Result Date: 07/03/2024  *INTRAOPERATIVE TRANSESOPHAGEAL REPORT *  Patient Name:   Tim Cook Date of Exam: 07/02/2024 Medical Rec #:  [id number]        Height:       69.0 in Accession #:    [id number]      Weight:       228.6 lb Date of Birth:  01-31-1961       BSA:          2.19 m Patient Age:    63 years        BP: Patient Gender: M               HR: Transesophogeal exam was perform intraoperatively during surgical procedure. Patient was closely monitored under general anesthesia during the entirety of examination. Performing Phys: [id number] LINNIE KIDD LIGHTFOOT Diagnosing Phys: Lauraine Birmingham Complications: No known complications during this procedure. POST-OP IMPRESSIONS _ Left Ventricle: The left ventricle is unchanged from pre-bypass. _ Right Ventricle: The right ventricle appears unchanged from pre-bypass - mildly dilated with normal FAC of 46%. _ Aortic Valve: Bioprosthetic valve well-seated in aortic valve position. No evidence of paravalvular leaks. Unable to evaluate gradient due to inability to perform deep transgastric images. _ Mitral Valve: The mitral valve appears unchanged from pre-bypass. _ Tricuspid Valve: The tricuspid valve appears unchanged from pre-bypass. _ Interatrial Septum: The interatrial septum appears unchanged from pre-bypass. PRE-OP FINDINGS  Left Ventricle: The left ventricle has mild-moderately reduced systolic function, with an ejection fraction of 40-45%. Right Ventricle: The right ventricle has normal systolic function. The cavity was mildly dilated. Left Atrium: No left atrial/left atrial appendage thrombus was detected. Interatrial Septum: There is evidence of a patent foramen ovale with left-to-right flow. Mitral Valve: The mitral valve is normal in structure. Mitral valve regurgitation is trivial by color flow Doppler. Tricuspid Valve: The tricuspid valve was normal in structure. Tricuspid valve regurgitation is trivial by color flow Doppler. Aortic Valve: The aortic valve is tricuspid with significant calcifcations. Patient is s/p Lap Band procedure, therefore, limited evaluation of aortic valve in deep transgastric and gastric  views. Unable to further quantify degree of stenosis. Aorta: The aortic root is normal in size and structure. +--------------+--------++ LEFT VENTRICLE         +--------------+--------++ PLAX 2D                +--------------+--------++ LVOT diam:    2.40 cm  +--------------+--------++ LVOT Area:    4.52 cm +--------------+--------++                        +--------------+--------++ +---------------+------+-------+ RIGHT VENTRICLE              +---------------+------+-------+ TAPSE (M-mode):2.0 cm2.37 cm +---------------+------+-------+  +--------------+-------++ AORTA                 +--------------+-------++ Ao Sinus diam:3.40 cm +--------------+-------++ Ao STJ diam:  2.9 cm  +--------------+-------++ +--------------+----------++ MITRAL VALVE              +--------------+-------+ +--------------+----------++  SHUNTS                MV Area (PHT):2.94 cm    +--------------+-------+ +--------------+----------++  Systemic Diam:2.40 cm MV PHT:       74.82 msec  +--------------+-------+ +--------------+----------++ MV Decel Time:258 msec   +--------------+----------++ +--------------+----------++ MV E velocity:61.90 cm/s +--------------+----------++ MV A velocity:54.00 cm/s +--------------+----------++ MV E/A ratio: 1.15       +--------------+----------++  Lauraine Birmingham Electronically signed by Lauraine Birmingham Signature Date/Time: 07/03/2024/5:12:41 PM    Final    DG Chest Port 1 View Result Date: 07/03/2024 CLINICAL DATA:  Postop from aortic valve replacement and CABG. EXAM: PORTABLE CHEST 1 VIEW COMPARISON:  07/02/2024 FINDINGS: Endotracheal tube and nasogastric tube have been removed since previous study. Right jugular central venous catheter remains in place. No pneumothorax visualized. Increased mild bibasilar atelectasis noted. Heart size remains stable. Prior CABG and aortic valve replacement again seen. IMPRESSION: Increased mild  bibasilar atelectasis. No pneumothorax visualized. Electronically Signed   By: Norleen DELENA Kil M.D.   On: 07/03/2024 06:16   DG Chest Port 1 View Result Date: 07/02/2024 CLINICAL DATA:  Status post CABG and aortic valve replacement. EXAM: PORTABLE CHEST 1 VIEW COMPARISON:  07/01/2024 FINDINGS: Endotracheal tube in satisfactory position. Nasogastric tube extending into the stomach with its tip and side hole in the mid stomach. Interval post CABG changes and prosthetic aortic valve. Right jugular catheter tip in the proximal superior vena cava. No pneumothorax. Minimal pneumomediastinum. Interval mild bibasilar atelectasis. Old, healed right rib fractures. IMPRESSION: 1. Interval post CABG changes and prosthetic aortic valve. 2. Minimal pneumomediastinum. 3. Mild bibasilar atelectasis. Electronically Signed   By: Elspeth Bathe M.D.   On: 07/02/2024 15:25   DG Chest Port 1 View Result Date: 07/01/2024 CLINICAL DATA:  Preop for liked of surgery. EXAM: PORTABLE CHEST 1 VIEW COMPARISON:  08/13/2021 FINDINGS: The cardiac silhouette, mediastinal and hilar contours are within normal limits. The lungs are clear. No pleural effusion. No pulmonary lesions or pneumothorax. The bony thorax is intact. IMPRESSION: No acute cardiopulmonary findings. Electronically Signed   By: MYRTIS Stammer M.D.   On: 07/01/2024 19:16   ECHOCARDIOGRAM COMPLETE Result Date: 07/01/2024    ECHOCARDIOGRAM REPORT   Patient Name:   Tim Cook Date of Exam: 07/01/2024 Medical Rec #:  [id number]       Height:       69.0 in Accession #:    [id number]      Weight:       226.0 lb Date of Birth:  31-Dec-1960       BSA:          2.176 m Patient Age:    63 years        BP:           151/81 mmHg Patient Gender: M               HR:           74 bpm. Exam Location:  Inpatient Procedure: 2D Echo, Cardiac Doppler and Color Doppler (Both Spectral and Color            Flow Doppler were utilized during procedure). Indications:    CAD Native Vessel I25.10  History:         Patient has prior history of Echocardiogram examinations, most                 recent 03/12/2024. CAD; Risk Factors:Hypertension and Diabetes.  Sonographer:    Jayson Gaskins Referring Phys: [id number] HARRELL O LIGHTFOOT IMPRESSIONS  1. Left ventricular ejection fraction, by estimation, is 45 to 50%. The left ventricle has mildly decreased function. The  left ventricle demonstrates global hypokinesis. Left ventricular diastolic parameters are consistent with Grade I diastolic dysfunction (impaired relaxation).  2. Right ventricular systolic function is normal. The right ventricular size is normal.  3. The mitral valve is normal in structure. No evidence of mitral valve regurgitation. No evidence of mitral stenosis.  4. DI: 0.20. The aortic valve is calcified. There is severe calcifcation of the aortic valve. There is moderate thickening of the aortic valve. Aortic valve regurgitation is not visualized. Severe aortic valve stenosis. Aortic valve area, by VTI measures 0.82 cm. Aortic valve mean gradient measures 27.0 mmHg. Aortic valve Vmax measures 3.27 m/s.  5. The inferior vena cava is normal in size with greater than 50% respiratory variability, suggesting right atrial pressure of 3 mmHg. FINDINGS  Left Ventricle: Left ventricular ejection fraction, by estimation, is 45 to 50%. The left ventricle has mildly decreased function. The left ventricle demonstrates global hypokinesis. The left ventricular internal cavity size was normal in size. There is  no left ventricular hypertrophy. Left ventricular diastolic parameters are consistent with Grade I diastolic dysfunction (impaired relaxation). Right Ventricle: The right ventricular size is normal. No increase in right ventricular wall thickness. Right ventricular systolic function is normal. Left Atrium: Left atrial size was normal in size. Right Atrium: Right atrial size was normal in size. Pericardium: There is no evidence of pericardial effusion. Mitral Valve:  The mitral valve is normal in structure. No evidence of mitral valve regurgitation. No evidence of mitral valve stenosis. Tricuspid Valve: The tricuspid valve is normal in structure. Tricuspid valve regurgitation is not demonstrated. No evidence of tricuspid stenosis. Aortic Valve: DI: 0.20. The aortic valve is calcified. There is severe calcifcation of the aortic valve. There is moderate thickening of the aortic valve. Aortic valve regurgitation is not visualized. Severe aortic stenosis is present. Aortic valve mean gradient measures 27.0 mmHg. Aortic valve peak gradient measures 42.8 mmHg. Aortic valve area, by VTI measures 0.82 cm. Pulmonic Valve: The pulmonic valve was normal in structure. Pulmonic valve regurgitation is not visualized. No evidence of pulmonic stenosis. Aorta: The aortic root is normal in size and structure. Venous: The inferior vena cava is normal in size with greater than 50% respiratory variability, suggesting right atrial pressure of 3 mmHg. IAS/Shunts: No atrial level shunt detected by color flow Doppler.  LEFT VENTRICLE PLAX 2D LVIDd:         5.00 cm   Diastology LVIDs:         3.50 cm   LV e' medial:    7.94 cm/s LV PW:         1.20 cm   LV E/e' medial:  9.2 LV IVS:        1.20 cm   LV e' lateral:   8.16 cm/s LVOT diam:     2.00 cm   LV E/e' lateral: 9.0 LV SV:         63 LV SV Index:   29 LVOT Area:     3.14 cm  RIGHT VENTRICLE RV S prime:     9.68 cm/s TAPSE (M-mode): 1.9 cm LEFT ATRIUM             Index        RIGHT ATRIUM           Index LA Vol (A2C):   35.2 ml 16.18 ml/m  RA Area:     11.00 cm LA Vol (A4C):   34.5 ml 15.85 ml/m  RA Volume:   24.10 ml  11.07 ml/m LA Biplane Vol: 34.8 ml 15.99 ml/m  AORTIC VALVE AV Area (Vmax):    0.64 cm AV Area (Vmean):   0.71 cm AV Area (VTI):     0.82 cm AV Vmax:           327.00 cm/s AV Vmean:          238.667 cm/s AV VTI:            0.766 m AV Peak Grad:      42.8 mmHg AV Mean Grad:      27.0 mmHg LVOT Vmax:         66.30 cm/s LVOT  Vmean:        54.200 cm/s LVOT VTI:          0.199 m LVOT/AV VTI ratio: 0.26  AORTA Ao Root diam: 3.20 cm MITRAL VALVE MV Area (PHT): 3.53 cm    SHUNTS MV Decel Time: 215 msec    Systemic VTI:  0.20 m MV E velocity: 73.40 cm/s  Systemic Diam: 2.00 cm MV A velocity: 96.40 cm/s MV E/A ratio:  0.76 Oneil Parchment MD Electronically signed by Oneil Parchment MD Signature Date/Time: 07/01/2024/3:57:43 PM    Final    CARDIAC CATHETERIZATION Result Date: 07/01/2024 HEMODYNAMICS: RA:       5 mmHg (mean) RV:       35/2, 5 mmHg PA:       35/15 mmHg (22 mean) PCWP: 15 mmHg (mean) with v waves to    Estimated Fick CO/CI   5.57L/min, 2.56L/min/m2 Thermodilution CO/CI   5.57L/min, 2.56L/min/m2    TPG  7  mmHg     PVR  <2 Wood Units PAPi  4  IMPRESSION: Right heart catheterization as part of pre surgery planning for AVR Near normal left and right heart filling pressures with elevated BP Normal cardiac index by TD Mild group II PH, otherwise well compensated RV function RECOMMENDATIONS: Swan removed, echocardiogram today. Well optimized prior to valvular procedure   MR CARDIAC MORPHOLOGY W WO CONTRAST Result Date: 06/11/2024 CLINICAL DATA:  33M with severe AS, CAD EXAM: CARDIAC MRI TECHNIQUE: The patient was scanned on a 1.5 Tesla Siemens magnet. A dedicated cardiac coil was used. Functional imaging was done using Fiesta sequences. 2,3, and 4 chamber views were done to assess for RWMA's. Modified Simpson's rule using a short axis stack was used to calculate an ejection fraction on a dedicated work [employer], [employer]. The patient received 10 cc of Gadavist . After 10 minutes inversion recovery sequences were used to assess for infiltration and scar tissue. Phase contrast velocity mapping was performed above the aortic and pulmonic valves CONTRAST:  10 cc  of Gadavist  FINDINGS: Left ventricle: -Moderate hypertrophy -Small size -Mild systolic dysfunction -Normal ECV (25%) -Normal T2 values -No LGE LV EF: 45% (Normal  49-79%) Absolute volumes: LV EDV: 79mL (Normal 95-215 mL) LV ESV: 44mL (Normal 25-85 mL) LV SV: 35mL (Normal 61-145 mL) CO: 3.1L/min (Normal 3.4-7.8 L/min) Indexed volumes: LV EDV: 73mL/sq-m (Normal 50-108 mL/sq-m) LV ESV: 59mL/sq-m (Normal 11-47 mL/sq-m) LV SV: 72mL/sq-m (Normal 33-72 mL/sq-m) CI: 1.4L/min/sq-m (Normal 1.8-4.2 L/min/sq-m) Right ventricle: Small size with moderate systolic dysfunction RV EF:  36% (Normal 51-80%) Absolute volumes: RV EDV: 81mL (Normal 109-217 mL) RV ESV: 51mL (Normal 23-91 mL) RV SV: 29mL (Normal 71-141 mL) CO: 2.6L/min (Normal 2.8-8.8 L/min) Indexed volumes: RV EDV: 13mL/sq-m (Normal 58-109 mL/sq-m) RV ESV: 65mL/sq-m (Normal 12-46 mL/sq-m) RV SV: 73mL/sq-m (Normal 38-71 mL/sq-m) CI: 1.1L/min/sq-m (Normal 1.7-4.2 L/min/sq-m)  Left atrium: Normal size. Lipomatous hypertrophy of interatrial septum Right atrium: Normal size Mitral valve: Mild regurgitation Aortic valve: Trivial regurgitation Tricuspid valve: Mild regurgitation Pulmonic valve: Trivial regurgitation Aorta: Normal proximal ascending aorta Pericardium: Normal IMPRESSION: 1. Small LV size, moderate hypertrophy, and mild systolic dysfunction (EF 45%) 2.  Small RV size with moderate systolic dysfunction (EF 36%) 3.  No LGE to suggest myocardial scar 4.  Lipomatous hypertrophy of interatrial septum Electronically Signed   By: Lonni Nanas M.D.   On: 06/11/2024 19:06   MR CARDIAC VELOCITY FLOW MAP Result Date: 06/11/2024 CLINICAL DATA:  28M with severe AS, CAD EXAM: CARDIAC MRI TECHNIQUE: The patient was scanned on a 1.5 Tesla Siemens magnet. A dedicated cardiac coil was used. Functional imaging was done using Fiesta sequences. 2,3, and 4 chamber views were done to assess for RWMA's. Modified Simpson's rule using a short axis stack was used to calculate an ejection fraction on a dedicated work [employer] officer, [employer]. The patient received 10 cc of Gadavist . After 10 minutes inversion recovery sequences were used  to assess for infiltration and scar tissue. Phase contrast velocity mapping was performed above the aortic and pulmonic valves CONTRAST:  10 cc  of Gadavist  FINDINGS: Left ventricle: -Moderate hypertrophy -Small size -Mild systolic dysfunction -Normal ECV (25%) -Normal T2 values -No LGE LV EF: 45% (Normal 49-79%) Absolute volumes: LV EDV: 79mL (Normal 95-215 mL) LV ESV: 44mL (Normal 25-85 mL) LV SV: 35mL (Normal 61-145 mL) CO: 3.1L/min (Normal 3.4-7.8 L/min) Indexed volumes: LV EDV: 40mL/sq-m (Normal 50-108 mL/sq-m) LV ESV: 59mL/sq-m (Normal 11-47 mL/sq-m) LV SV: 64mL/sq-m (Normal 33-72 mL/sq-m) CI: 1.4L/min/sq-m (Normal 1.8-4.2 L/min/sq-m) Right ventricle: Small size with moderate systolic dysfunction RV EF:  36% (Normal 51-80%) Absolute volumes: RV EDV: 81mL (Normal 109-217 mL) RV ESV: 51mL (Normal 23-91 mL) RV SV: 29mL (Normal 71-141 mL) CO: 2.6L/min (Normal 2.8-8.8 L/min) Indexed volumes: RV EDV: 35mL/sq-m (Normal 58-109 mL/sq-m) RV ESV: 8mL/sq-m (Normal 12-46 mL/sq-m) RV SV: 64mL/sq-m (Normal 38-71 mL/sq-m) CI: 1.1L/min/sq-m (Normal 1.7-4.2 L/min/sq-m) Left atrium: Normal size. Lipomatous hypertrophy of interatrial septum Right atrium: Normal size Mitral valve: Mild regurgitation Aortic valve: Trivial regurgitation Tricuspid valve: Mild regurgitation Pulmonic valve: Trivial regurgitation Aorta: Normal proximal ascending aorta Pericardium: Normal IMPRESSION: 1. Small LV size, moderate hypertrophy, and mild systolic dysfunction (EF 45%) 2.  Small RV size with moderate systolic dysfunction (EF 36%) 3.  No LGE to suggest myocardial scar 4.  Lipomatous hypertrophy of interatrial septum Electronically Signed   By: Lonni Nanas M.D.   On: 06/11/2024 19:06   MR CARDIAC VELOCITY FLOW MAP Result Date: 06/11/2024 CLINICAL DATA:  28M with severe AS, CAD EXAM: CARDIAC MRI TECHNIQUE: The patient was scanned on a 1.5 Tesla Siemens magnet. A dedicated cardiac coil was used. Functional imaging was done using Fiesta  sequences. 2,3, and 4 chamber views were done to assess for RWMA's. Modified Simpson's rule using a short axis stack was used to calculate an ejection fraction on a dedicated work [employer] officer, [employer]. The patient received 10 cc of Gadavist . After 10 minutes inversion recovery sequences were used to assess for infiltration and scar tissue. Phase contrast velocity mapping was performed above the aortic and pulmonic valves CONTRAST:  10 cc  of Gadavist  FINDINGS: Left ventricle: -Moderate hypertrophy -Small size -Mild systolic dysfunction -Normal ECV (25%) -Normal T2 values -No LGE LV EF: 45% (Normal 49-79%) Absolute volumes: LV EDV: 79mL (Normal 95-215 mL) LV ESV: 44mL (Normal 25-85 mL) LV SV: 35mL (Normal 61-145 mL)  CO: 3.1L/min (Normal 3.4-7.8 L/min) Indexed volumes: LV EDV: 81mL/sq-m (Normal 50-108 mL/sq-m) LV ESV: 32mL/sq-m (Normal 11-47 mL/sq-m) LV SV: 22mL/sq-m (Normal 33-72 mL/sq-m) CI: 1.4L/min/sq-m (Normal 1.8-4.2 L/min/sq-m) Right ventricle: Small size with moderate systolic dysfunction RV EF:  36% (Normal 51-80%) Absolute volumes: RV EDV: 81mL (Normal 109-217 mL) RV ESV: 51mL (Normal 23-91 mL) RV SV: 29mL (Normal 71-141 mL) CO: 2.6L/min (Normal 2.8-8.8 L/min) Indexed volumes: RV EDV: 82mL/sq-m (Normal 58-109 mL/sq-m) RV ESV: 70mL/sq-m (Normal 12-46 mL/sq-m) RV SV: 34mL/sq-m (Normal 38-71 mL/sq-m) CI: 1.1L/min/sq-m (Normal 1.7-4.2 L/min/sq-m) Left atrium: Normal size. Lipomatous hypertrophy of interatrial septum Right atrium: Normal size Mitral valve: Mild regurgitation Aortic valve: Trivial regurgitation Tricuspid valve: Mild regurgitation Pulmonic valve: Trivial regurgitation Aorta: Normal proximal ascending aorta Pericardium: Normal IMPRESSION: 1. Small LV size, moderate hypertrophy, and mild systolic dysfunction (EF 45%) 2.  Small RV size with moderate systolic dysfunction (EF 36%) 3.  No LGE to suggest myocardial scar 4.  Lipomatous hypertrophy of interatrial septum Electronically Signed   By:  Lonni Nanas M.D.   On: 06/11/2024 19:06     Results for orders placed or performed during the hospital encounter of 07/01/24 (from the past 48 hours)  Glucose, capillary     Status: Abnormal   Collection Time: 07/05/24 12:16 PM  Result Value Ref Range   Glucose-Capillary 142 (H) 70 - 99 mg/dL    Comment: Glucose reference range applies only to samples taken after fasting for at least 8 hours.  Glucose, capillary     Status: Abnormal   Collection Time: 07/05/24  4:06 PM  Result Value Ref Range   Glucose-Capillary 130 (H) 70 - 99 mg/dL    Comment: Glucose reference range applies only to samples taken after fasting for at least 8 hours.  Glucose, capillary     Status: Abnormal   Collection Time: 07/05/24  9:09 PM  Result Value Ref Range   Glucose-Capillary 138 (H) 70 - 99 mg/dL    Comment: Glucose reference range applies only to samples taken after fasting for at least 8 hours.  Basic metabolic panel with GFR     Status: Abnormal   Collection Time: 07/06/24  3:49 AM  Result Value Ref Range   Sodium 134 (L) 135 - 145 mmol/L   Potassium 4.5 3.5 - 5.1 mmol/L   Chloride 98 98 - 111 mmol/L   CO2 26 22 - 32 mmol/L   Glucose, Bld 132 (H) 70 - 99 mg/dL    Comment: Glucose reference range applies only to samples taken after fasting for at least 8 hours.   BUN 14 8 - 23 mg/dL   Creatinine, Ser 8.78 0.61 - 1.24 mg/dL   Calcium  8.4 (L) 8.9 - 10.3 mg/dL   GFR, Estimated >39 >39 mL/min    Comment: (NOTE) Calculated using the CKD-EPI Creatinine Equation (2021)    Anion gap 10 5 - 15    Comment: Performed at [facility], [address], [place], [place] [address]  Glucose, capillary     Status: Abnormal   Collection Time: 07/06/24  5:27 AM  Result Value Ref Range   Glucose-Capillary 115 (H) 70 - 99 mg/dL    Comment: Glucose reference range applies only to samples taken after fasting for at least 8 hours.  Glucose, capillary     Status: Abnormal   Collection Time: 07/06/24  11:29 AM  Result Value Ref Range   Glucose-Capillary 174 (H) 70 - 99 mg/dL    Comment: Glucose reference range  applies only to samples taken after fasting for at least 8 hours.  Glucose, capillary     Status: Abnormal   Collection Time: 07/06/24  4:34 PM  Result Value Ref Range   Glucose-Capillary 112 (H) 70 - 99 mg/dL    Comment: Glucose reference range applies only to samples taken after fasting for at least 8 hours.   Comment 1 Notify RN    Comment 2 Document in Chart   Glucose, capillary     Status: Abnormal   Collection Time: 07/06/24  9:21 PM  Result Value Ref Range   Glucose-Capillary 145 (H) 70 - 99 mg/dL    Comment: Glucose reference range applies only to samples taken after fasting for at least 8 hours.   Comment 1 Notify RN    Comment 2 Document in Chart   Basic metabolic panel with GFR     Status: Abnormal   Collection Time: 07/07/24  3:00 AM  Result Value Ref Range   Sodium 136 135 - 145 mmol/L   Potassium 4.5 3.5 - 5.1 mmol/L   Chloride 97 (L) 98 - 111 mmol/L   CO2 28 22 - 32 mmol/L   Glucose, Bld 132 (H) 70 - 99 mg/dL    Comment: Glucose reference range applies only to samples taken after fasting for at least 8 hours.   BUN 15 8 - 23 mg/dL   Creatinine, Ser 8.68 (H) 0.61 - 1.24 mg/dL   Calcium  8.6 (L) 8.9 - 10.3 mg/dL   GFR, Estimated >39 >39 mL/min    Comment: (NOTE) Calculated using the CKD-EPI Creatinine Equation (2021)    Anion gap 11 5 - 15    Comment: Performed at [facility], [address], [place], [place] [address]  Glucose, capillary     Status: Abnormal   Collection Time: 07/07/24  6:10 AM  Result Value Ref Range   Glucose-Capillary 111 (H) 70 - 99 mg/dL    Comment: Glucose reference range applies only to samples taken after fasting for at least 8 hours.   Comment 1 Notify RN    Comment 2 Document in Chart      Treatments:  07/02/2024 Patient:  Tim Cook Pre-Op Dx: aortic stenosis Coronary artery diseas   Post-op Dx:   same Procedure: CABG X 2. RSVG OM2 and 3   Aortic valve replacement with a 25mm inspiris valve  Endoscopic greater saphenous vein harvest on the right     Surgeon and Role:      * Lightfoot, Linnie KIDD, MD - Primary    DEWAINE MICAEL Cera, PA-C - assisting  Discharge Exam: Blood pressure 134/76, pulse 98, temperature 97.8 F (36.6 C), temperature source Oral, resp. rate 20, height 5' 9 (1.753 m), weight 105.8 kg, SpO2 96%. General appearance: alert, cooperative, and no distress Neurologic: intact Heart: regular rate and rhythm, S1, S2 normal, no murmur, click, rub or gallop Lungs: clear to auscultation bilaterally Abdomen: soft, non-tender; bowel sounds normal; no masses,  no organomegaly Extremities: edema trace BLE Wound: Clean and dry without sign of infection  Discharge Medications:  The patient has been discharged on:   1.Beta Blocker:  Yes [ X  ]                              No   [   ]  If No, reason:  2.Ace Inhibitor/ARB: Yes [   ]                                     No  [   X ]                                     If No, reason: BP controlled, titrated BB for better HR control  3.Statin:   Yes [ X  ]                  No  [   ]                  If No, reason:  4.Ecasa:  Yes  [  X ]                  No   [   ]                  If No, reason:  Patient had ACS upon admission: No  Plavix/P2Y12 inhibitor: Yes [   ]                                      No  [ X  ]     Discharge Instructions     Amb Referral to Cardiac Rehabilitation   Complete by: As directed    Diagnosis: Valve Replacement   Valve: Aortic   After initial evaluation and assessments completed: Virtual Based Care may be provided alone or in conjunction with Phase 2 Cardiac Rehab based on patient barriers.: Yes   Intensive Cardiac Rehabilitation (ICR) [facility] location only OR Traditional Cardiac Rehabilitation (TCR) *If criteria for ICR are not met will enroll in TCR ([facility] only): Yes       Allergies as of 07/07/2024   No Known Allergies      Medication List     STOP taking these medications    losartan  100 MG tablet Commonly known as: COZAAR    naproxen sodium 220 MG tablet Commonly known as: ALEVE   nitroGLYCERIN  0.4 MG SL tablet Commonly known as: NITROSTAT        TAKE these medications    acetaminophen  325 MG tablet Commonly known as: Tylenol  Take 2 tablets (650 mg total) by mouth every 6 (six) hours as needed for mild pain (pain score 1-3). What changed:  medication strength how much to take reasons to take this   aspirin  EC 325 MG tablet Take 1 tablet (325 mg total) by mouth daily. What changed:  medication strength how much to take   atorvastatin  40 MG tablet Commonly known as: LIPITOR Take 40 mg by mouth daily.   cetirizine 10 MG tablet Commonly known as: ZYRTEC Take 10 mg by mouth daily.   diphenhydrAMINE  25 mg capsule Commonly known as: BENADRYL  Take 1 capsule (25 mg total) by mouth every 8 (eight) hours as needed for itching.   Farxiga  10 MG Tabs tablet Generic drug: dapagliflozin  propanediol Take 10 mg by mouth daily.   furosemide  40 MG tablet Commonly known as: LASIX  Take 1 tablet (40 mg total) by mouth daily. Take for 10 days then stop   gabapentin  100 MG capsule Commonly known  as: NEURONTIN  TAKE 2 CAPSULES(200 MG) BY MOUTH AT BEDTIME What changed: See the new instructions.   metFORMIN  500 MG tablet Commonly known as: GLUCOPHAGE  Take 1,000 mg by mouth 2 (two) times daily with a meal.   metoprolol  tartrate 25 MG tablet Commonly known as: LOPRESSOR  Take 1.5 tablets (37.5 mg total) by mouth 2 (two) times daily.   mometasone 50 MCG/ACT nasal spray Commonly known as: NASONEX Place 2 sprays into the nose daily as needed (Rhinitis).   multivitamin capsule Take 1 capsule by mouth daily.   potassium chloride  SA 20 MEQ tablet Commonly known as: KLOR-CON  M Take 1 tablet (20 mEq total) by mouth daily. Take for 10  days then stop   tirzepatide 12.5 MG/0.5ML Pen Commonly known as: MOUNJARO Inject 12.5 mg into the skin once a week.   tiZANidine  2 MG tablet Commonly known as: ZANAFLEX  TAKE 1 TABLET BY MOUTH EVERYDAY AT BEDTIME   traMADol 50 MG tablet Commonly known as: ULTRAM Take 1 tablet (50 mg total) by mouth every 6 (six) hours as needed for severe pain (pain score 7-10).        Follow-up Information     Shyrl Linnie KIDD, MD Follow up on 07/12/2024.   Specialty: Cardiothoracic Surgery Why: Virtual appointment is at 2:30PM. Please do not go to the office as this is a VIRTUAL appointment. Dr. Shyrl will call you. Contact information: [address] [place] [address] [phone]         Bernie Lamar PARAS, MD Follow up on 07/23/2024.   Specialty: Cardiology Why: Cardiology appointment is at 3:20PM Contact information: [address] [place] [address] [phone]         [facility] CV Img Echo at [facility] [place] Follow up on 08/16/2024.   Specialty: Cardiology Why: Echocardiogram is at 1:05PM Contact information: [address] [place]  [address] [phone]                Signed:  Con GORMAN Bend, PA-C  07/07/2024, 10:34 AM

## 2024-07-05 NOTE — Progress Notes (Addendum)
 [address] [place] [address]             [phone]      3 Days Post-Op Procedure(s) (LRB): REPLACEMENT, AORTIC VALVE, OPEN USING INSPIRIS (N/A) CORONARY ARTERY BYPASS GRAFTING (CABG) X2 USING ENDOSCOPICALLY HARVESTED RIGHT GREATER SAPHENOUS VEIN (N/A) ECHOCARDIOGRAM, TRANSESOPHAGEAL, INTRAOPERATIVE (N/A) Subjective: Patient reports his pain is a lot better today, but it still hurts to take a deep breath.   Objective: Vital signs in last 24 hours: Temp:  [97.7 F (36.5 C)-98.9 F (37.2 C)] 97.8 F (36.6 C) (10/10 0742) Pulse Rate:  [90-109] 97 (10/10 0742) Cardiac Rhythm: Normal sinus rhythm;Sinus tachycardia (10/09 2000) Resp:  [16-31] 20 (10/10 0742) BP: (110-156)/(63-115) 118/77 (10/10 0742) SpO2:  [93 %-100 %] 100 % (10/10 0742) Weight:  [109.7 kg] 109.7 kg (10/10 0537)  Hemodynamic parameters for last 24 hours:    Intake/Output from previous day: 10/09 0701 - 10/10 0700 In: 486 [P.O.:480; I.V.:6] Out: 2625 [Urine:2575; Chest Tube:50] Intake/Output this shift: No intake/output data recorded.  General appearance: alert, cooperative, and no distress Neurologic: intact Heart: regular rate and rhythm-sinus tachycardia, no murmur, no rub Lungs: slightly diminished bibasilar breath sounds Abdomen: soft, non-tender; bowel sounds normal; no masses,  no organomegaly Extremities: edema 1+ BLE and BUE Wound: Clean and dry without sign of infection  Lab Results: Recent Labs    07/04/24 0332 07/05/24 0334  WBC 12.1* 10.9*  HGB 10.8* 10.4*  HCT 32.2* 31.2*  PLT 134* 146*   BMET:  Recent Labs    07/04/24 0332 07/05/24 0334  NA 134* 134*  K 3.7 4.9  CL 99 100  CO2 24 25  GLUCOSE 141* 138*  BUN 13 14  CREATININE 1.20 1.22  CALCIUM  8.0* 8.3*    PT/INR:  Recent Labs    07/02/24 1425  LABPROT 17.3*  INR 1.3*   ABG    Component Value Date/Time   PHART 7.367 07/03/2024 0230   HCO3 22.2 07/03/2024 0230   TCO2 23  07/03/2024 0230   ACIDBASEDEF 3.0 (H) 07/03/2024 0230   O2SAT 97 07/03/2024 0230   CBG (last 3)  Recent Labs    07/04/24 1633 07/04/24 2133 07/05/24 0600  GLUCAP 158* 152* 117*    Assessment/Plan: S/P Procedure(s) (LRB): REPLACEMENT, AORTIC VALVE, OPEN USING INSPIRIS (N/A) CORONARY ARTERY BYPASS GRAFTING (CABG) X2 USING ENDOSCOPICALLY HARVESTED RIGHT GREATER SAPHENOUS VEIN (N/A) ECHOCARDIOGRAM, TRANSESOPHAGEAL, INTRAOPERATIVE (N/A)  CV: BP controlled, SBP 110-130. NSR-ST, HR 90s-105 with one burst into the 120s. On Lopressor  12.5mg  BID, will titrate  for better HR control.   Pulm: Saturating well on RA. CXR yesterday with bibasilar atelectasis. Encourage IS and ambulation.   GI: Tolerating a diet. -BM. Patient feels like he will have a BM soon now that he is eating more. Will add lactulose PRN.   Endo: T2DM. Preop A1C 5.4. CBGs controlled on AC/HS SSI. Will restart metformin  closer to discharge. Will restart Farxiga  and Mounjaro at discharge.   Renal: Cr 1.22, stable. UO 2575cc/24hrs. +15lbs from preop weight. Continue PO Lasix  40mg  daily per Dr. Shyrl. K 4.9, at goal.   ID: Likely reactive leukocytosis, WBC 10.9. Trending down. Afebrile.   Expected postop ABLA: H/H 10.4/31.2. Stable, not clinically significant at this time. Thrombocytopenia, plt 146,000, improved. Clinically monitor.   DVT Prophylaxis: Lovenox  Dispo: Hopefully can d/c home in 48-72 hours.    LOS: 4 days    Con GORMAN Bend, PA-C  07/05/2024  Agree Doing well Dispo planning  Talin Feister O Inaara Tye

## 2024-07-05 NOTE — Telephone Encounter (Signed)
 Patient called and asked if he could have a refill of tizanidine  2mg  prescription. Please advise.

## 2024-07-06 LAB — BASIC METABOLIC PANEL WITH GFR
Anion gap: 10 (ref 5–15)
BUN: 14 mg/dL (ref 8–23)
CO2: 26 mmol/L (ref 22–32)
Calcium: 8.4 mg/dL — ABNORMAL LOW (ref 8.9–10.3)
Chloride: 98 mmol/L (ref 98–111)
Creatinine, Ser: 1.21 mg/dL (ref 0.61–1.24)
GFR, Estimated: >60 mL/min (ref >60)
Glucose, Bld: 132 mg/dL — ABNORMAL HIGH (ref 70–99)
Potassium: 4.5 mmol/L (ref 3.5–5.1)
Sodium: 134 mmol/L — ABNORMAL LOW (ref 135–145)

## 2024-07-06 LAB — GLUCOSE, CAPILLARY
Glucose-Capillary: 115 mg/dL — ABNORMAL HIGH (ref 70–99)
Glucose-Capillary: 174 mg/dL — ABNORMAL HIGH (ref 70–99)
Glucose-Capillary: 112 mg/dL — ABNORMAL HIGH (ref 70–99)
Glucose-Capillary: 145 mg/dL — ABNORMAL HIGH (ref 70–99)

## 2024-07-06 MED ORDER — NAPHAZOLINE-GLYCERIN 0.012-0.25 % OP SOLN
1.0000 [drp] | Freq: Four times a day (QID) | OPHTHALMIC | Status: DC | PRN
  Administered 2024-07-06 (×2): 2 [drp] via OPHTHALMIC
  Filled 2024-07-06: qty 15

## 2024-07-06 MED ORDER — FUROSEMIDE 10 MG/ML IJ SOLN
40.0000 mg | Freq: Once | INTRAMUSCULAR | Status: AC
  Administered 2024-07-06: 40 mg via INTRAVENOUS
  Filled 2024-07-06: qty 4

## 2024-07-06 MED ORDER — METFORMIN HCL 500 MG PO TABS
1000.0000 mg | ORAL_TABLET | Freq: Two times a day (BID) | ORAL | Status: DC
  Administered 2024-07-06 – 2024-07-07 (×2): 1000 mg via ORAL
  Filled 2024-07-06 (×3): qty 2

## 2024-07-06 MED ORDER — FUROSEMIDE 40 MG PO TABS
40.0000 mg | ORAL_TABLET | Freq: Every day | ORAL | Status: DC
  Administered 2024-07-06 – 2024-07-07 (×2): 40 mg via ORAL
  Filled 2024-07-06 (×2): qty 1

## 2024-07-06 MED ORDER — METOPROLOL TARTRATE 25 MG PO TABS
37.5000 mg | ORAL_TABLET | Freq: Two times a day (BID) | ORAL | Status: DC
  Administered 2024-07-06 – 2024-07-07 (×3): 37.5 mg via ORAL
  Filled 2024-07-06 (×3): qty 1

## 2024-07-06 NOTE — Plan of Care (Signed)
   Problem: Clinical Measurements: Goal: Ability to maintain clinical measurements within normal limits will improve Outcome: Progressing

## 2024-07-06 NOTE — Progress Notes (Addendum)
 [address] [place] [address]             [phone]      4 Days Post-Op Procedure(s) (LRB): REPLACEMENT, AORTIC VALVE, OPEN USING INSPIRIS (N/A) CORONARY ARTERY BYPASS GRAFTING (CABG) X2 USING ENDOSCOPICALLY HARVESTED RIGHT GREATER SAPHENOUS VEIN (N/A) ECHOCARDIOGRAM, TRANSESOPHAGEAL, INTRAOPERATIVE (N/A) Subjective: Patient sitting on the edge of the bed asking if he is going home today. Reports he had some pain with coughing last night that also gave him a headache but this improved with Tylenol .   Objective: Vital signs in last 24 hours: Temp:  [97.8 F (36.6 C)-98.5 F (36.9 C)] 98.5 F (36.9 C) (10/11 0754) Pulse Rate:  [90-100] 100 (10/11 0754) Cardiac Rhythm: Normal sinus rhythm (10/10 1900) Resp:  [16-25] 19 (10/11 0754) BP: (117-144)/(63-81) 144/75 (10/11 0754) SpO2:  [95 %-100 %] 97 % (10/11 0754) Weight:  [108.7 kg] 108.7 kg (10/11 0531)  Hemodynamic parameters for last 24 hours:    Intake/Output from previous day: 10/10 0701 - 10/11 0700 In: 0  Out: 1050 [Urine:1050] Intake/Output this shift: No intake/output data recorded.  General appearance: alert, cooperative, and no distress Neurologic: intact Heart: regular rate and rhythm-sinus tachycardia, no murmur Lungs: slightly diminished bibasilar breath sounds Abdomen: soft, non-tender; bowel sounds normal; no masses,  no organomegaly Extremities: edema 1+ BLE and BUE Wound: Clean and dry without sign of infection  Lab Results: Recent Labs    07/04/24 0332 07/05/24 0334  WBC 12.1* 10.9*  HGB 10.8* 10.4*  HCT 32.2* 31.2*  PLT 134* 146*   BMET:  Recent Labs    07/05/24 0334 07/06/24 0349  NA 134* 134*  K 4.9 4.5  CL 100 98  CO2 25 26  GLUCOSE 138* 132*  BUN 14 14  CREATININE 1.22 1.21  CALCIUM  8.3* 8.4*    PT/INR: No results for input(s): LABPROT, INR in the last 72 hours. ABG    Component Value Date/Time   PHART 7.367 07/03/2024 0230   HCO3  22.2 07/03/2024 0230   TCO2 23 07/03/2024 0230   ACIDBASEDEF 3.0 (H) 07/03/2024 0230   O2SAT 97 07/03/2024 0230   CBG (last 3)  Recent Labs    07/05/24 1606 07/05/24 2109 07/06/24 0527  GLUCAP 130* 138* 115*    Assessment/Plan: S/P Procedure(s) (LRB): REPLACEMENT, AORTIC VALVE, OPEN USING INSPIRIS (N/A) CORONARY ARTERY BYPASS GRAFTING (CABG) X2 USING ENDOSCOPICALLY HARVESTED RIGHT GREATER SAPHENOUS VEIN (N/A) ECHOCARDIOGRAM, TRANSESOPHAGEAL, INTRAOPERATIVE (N/A)  CV: BP controlled, SBP mostly 110-130 but up to 140 this AM. NSR-ST, HR 105 while sitting in bed. On Lopressor  25mg  BID, will titrate to 37.5mg  BID.    Pulm: Saturating well on RA. CXR yesterday with bibasilar atelectasis. Encourage IS and ambulation.    GI: Tolerating a diet. +BMx4 yesterday will d/c stool softeners.   Endo: T2DM. Preop A1C 5.4. CBGs controlled on AC/HS SSI. Will restart metformin  today. Will restart Farxiga  and Mounjaro at discharge.    Renal: Cr 1.21, stable. UO 1050cc/24hrs. +13lbs from preop weight. Continue PO Lasix  40mg  daily, will add 1 dose of IV Lasix  40mg  today. K 4.9, at goal.    ID: Likely reactive leukocytosis, WBC 10.9. Trending down. Afebrile.    Expected postop ABLA: H/H 10.4/31.2. Stable, not clinically significant at this time. Thrombocytopenia, last plt 146,000, improved. Clinically monitor.    DVT Prophylaxis: Lovenox   Dispo: Hopefully can d/c home tomorrow AM if remains stable and weight improves  LOS: 5 days    Con GORMAN Bend, PA-C 07/06/2024   Chart reviewed, patient examined, agree with above.  He is diuresing well today and ambulated a couple times. Plan home tomorrow if no changes.

## 2024-07-06 NOTE — Progress Notes (Signed)
 CARDIAC REHAB PHASE I     Post O[facility] education including site care, restrictions, heart healthy diabetic diet, sternal precautions, IS use at home, home needs at discharge, exercise guidelines and CRP2 reviewed. All questions and concerns addressed. Will refer to [facility] for CRP2. Will continue to follow.   [phone] Vaughn Asberry Hacking, RN BSN 07/06/2024 11:37 AM

## 2024-07-07 ENCOUNTER — Other Ambulatory Visit (HOSPITAL_COMMUNITY): Payer: Self-pay

## 2024-07-07 DIAGNOSIS — I350 Nonrheumatic aortic (valve) stenosis: Secondary | ICD-10-CM | POA: Diagnosis not present

## 2024-07-07 LAB — BASIC METABOLIC PANEL WITH GFR
Anion gap: 11 (ref 5–15)
BUN: 15 mg/dL (ref 8–23)
CO2: 28 mmol/L (ref 22–32)
Calcium: 8.6 mg/dL — ABNORMAL LOW (ref 8.9–10.3)
Chloride: 97 mmol/L — ABNORMAL LOW (ref 98–111)
Creatinine, Ser: 1.31 mg/dL — ABNORMAL HIGH (ref 0.61–1.24)
GFR, Estimated: >60 mL/min (ref >60)
Glucose, Bld: 132 mg/dL — ABNORMAL HIGH (ref 70–99)
Potassium: 4.5 mmol/L (ref 3.5–5.1)
Sodium: 136 mmol/L (ref 135–145)

## 2024-07-07 LAB — GLUCOSE, CAPILLARY: Glucose-Capillary: 111 mg/dL — ABNORMAL HIGH (ref 70–99)

## 2024-07-07 MED ORDER — ACETAMINOPHEN 325 MG PO TABS: 650.0000 mg | ORAL_TABLET | Freq: Four times a day (QID) | ORAL | Status: AC | PRN

## 2024-07-07 MED ORDER — TRAMADOL HCL 50 MG PO TABS
50.0000 mg | ORAL_TABLET | Freq: Four times a day (QID) | ORAL | 0 refills | Status: AC | PRN
  Filled 2024-07-07: qty 28, 7d supply, fill #0

## 2024-07-07 MED ORDER — ASPIRIN 325 MG PO TBEC: 325.0000 mg | DELAYED_RELEASE_TABLET | Freq: Every day | ORAL | Status: AC

## 2024-07-07 MED ORDER — METOPROLOL TARTRATE 25 MG PO TABS
37.5000 mg | ORAL_TABLET | Freq: Two times a day (BID) | ORAL | 1 refills | Status: DC
  Filled 2024-07-07: qty 90, 30d supply, fill #0

## 2024-07-07 MED ORDER — FUROSEMIDE 40 MG PO TABS
40.0000 mg | ORAL_TABLET | Freq: Every day | ORAL | 0 refills | Status: DC
  Filled 2024-07-07: qty 10, 10d supply, fill #0

## 2024-07-07 MED ORDER — POTASSIUM CHLORIDE CRYS ER 20 MEQ PO TBCR
20.0000 meq | EXTENDED_RELEASE_TABLET | Freq: Every day | ORAL | 0 refills | Status: DC
  Filled 2024-07-07: qty 10, 10d supply, fill #0

## 2024-07-07 MED ORDER — DIPHENHYDRAMINE HCL 25 MG PO CAPS: 25.0000 mg | ORAL_CAPSULE | Freq: Three times a day (TID) | ORAL | Status: AC | PRN

## 2024-07-07 NOTE — Progress Notes (Signed)
      [address] [place] [address]             [phone]      5 Days Post-Op Procedure(s) (LRB): REPLACEMENT, AORTIC VALVE, OPEN USING INSPIRIS (N/A) CORONARY ARTERY BYPASS GRAFTING (CABG) X2 USING ENDOSCOPICALLY HARVESTED RIGHT GREATER SAPHENOUS VEIN (N/A) ECHOCARDIOGRAM, TRANSESOPHAGEAL, INTRAOPERATIVE (N/A) Subjective: Patient reports he feels like he did too much yesterday and is a little sore this AM  Objective: Vital signs in last 24 hours: Temp:  [97.8 F (36.6 C)-98.1 F (36.7 C)] 97.8 F (36.6 C) (10/12 0729) Pulse Rate:  [89-98] 98 (10/12 0729) Cardiac Rhythm: Sinus tachycardia (10/11 1954) Resp:  [12-27] 20 (10/12 0729) BP: (115-165)/(70-86) 134/76 (10/12 0729) SpO2:  [96 %-98 %] 96 % (10/12 0729) Weight:  [105.8 kg] 105.8 kg (10/12 0535)  Hemodynamic parameters for last 24 hours:    Intake/Output from previous day: 10/11 0701 - 10/12 0700 In: -  Out: 4050 [Urine:4050] Intake/Output this shift: No intake/output data recorded.  General appearance: alert, cooperative, and no distress Neurologic: intact Heart: regular rate and rhythm, S1, S2 normal, no murmur, click, rub or gallop Lungs: clear to auscultation bilaterally Abdomen: soft, non-tender; bowel sounds normal; no masses,  no organomegaly Extremities: edema trace BLE Wound: Clean and dry without sign of infection  Lab Results: Recent Labs    07/05/24 0334  WBC 10.9*  HGB 10.4*  HCT 31.2*  PLT 146*   BMET:  Recent Labs    07/06/24 0349 07/07/24 0300  NA 134* 136  K 4.5 4.5  CL 98 97*  CO2 26 28  GLUCOSE 132* 132*  BUN 14 15  CREATININE 1.21 1.31*  CALCIUM  8.4* 8.6*    PT/INR: No results for input(s): LABPROT, INR in the last 72 hours. ABG    Component Value Date/Time   PHART 7.367 07/03/2024 0230   HCO3 22.2 07/03/2024 0230   TCO2 23 07/03/2024 0230   ACIDBASEDEF 3.0 (H) 07/03/2024 0230   O2SAT 97 07/03/2024 0230   CBG (last 3)  Recent  Labs    07/06/24 1634 07/06/24 2121 07/07/24 0610  GLUCAP 112* 145* 111*    Assessment/Plan: S/P Procedure(s) (LRB): REPLACEMENT, AORTIC VALVE, OPEN USING INSPIRIS (N/A) CORONARY ARTERY BYPASS GRAFTING (CABG) X2 USING ENDOSCOPICALLY HARVESTED RIGHT GREATER SAPHENOUS VEIN (N/A) ECHOCARDIOGRAM, TRANSESOPHAGEAL, INTRAOPERATIVE (N/A)  CV: BP controlled, SBP mostly 110s-130s. NSR, HR 90s this AM. One burst of ST up to 120s yesterday evening. Continue Lopressor  37.5mg  BID.    Pulm: Saturating well on RA. Last CXR with bibasilar atelectasis. Encourage IS and ambulation.    GI: Tolerating a diet. +BM   Endo: T2DM. Preop A1C 5.4. CBGs controlled on AC/HS SSI. On Metformin . Will restart Farxiga  and Mounjaro at discharge.    Renal: Cr 1.31, slight bump after IV Lasix  40mg  and PO Lasix  40mg  yesterday. UO 4050cc/24hrs. +7lbs from preop weight. Will continue PO Lasix  40mg  daily at discharge.  K 4.5, at goal.    ID: Likely reactive leukocytosis, last WBC 10.9. Trending down. Afebrile.    Expected postop ABLA: Last H/H 10.4/31.2. Stable, not clinically significant at this time. Thrombocytopenia, last plt 146,000, improved. Clinically monitor.    DVT Prophylaxis: Lovenox   Dispo: Plan to d/c home today   LOS: 6 days    Con GORMAN Bend, PA-C 07/07/2024

## 2024-07-08 ENCOUNTER — Other Ambulatory Visit: Payer: Self-pay

## 2024-07-08 DIAGNOSIS — Z952 Presence of prosthetic heart valve: Secondary | ICD-10-CM | POA: Diagnosis not present

## 2024-07-08 DIAGNOSIS — E119 Type 2 diabetes mellitus without complications: Secondary | ICD-10-CM | POA: Diagnosis not present

## 2024-07-08 DIAGNOSIS — I350 Nonrheumatic aortic (valve) stenosis: Secondary | ICD-10-CM | POA: Diagnosis not present

## 2024-07-08 DIAGNOSIS — Z951 Presence of aortocoronary bypass graft: Secondary | ICD-10-CM | POA: Diagnosis not present

## 2024-07-08 DIAGNOSIS — I2510 Atherosclerotic heart disease of native coronary artery without angina pectoris: Secondary | ICD-10-CM | POA: Diagnosis not present

## 2024-07-08 DIAGNOSIS — Z48812 Encounter for surgical aftercare following surgery on the circulatory system: Secondary | ICD-10-CM | POA: Diagnosis not present

## 2024-07-08 DIAGNOSIS — Z7985 Long-term (current) use of injectable non-insulin antidiabetic drugs: Secondary | ICD-10-CM | POA: Diagnosis not present

## 2024-07-08 DIAGNOSIS — M1990 Unspecified osteoarthritis, unspecified site: Secondary | ICD-10-CM | POA: Diagnosis not present

## 2024-07-08 DIAGNOSIS — Z7984 Long term (current) use of oral hypoglycemic drugs: Secondary | ICD-10-CM | POA: Diagnosis not present

## 2024-07-08 DIAGNOSIS — Z7982 Long term (current) use of aspirin: Secondary | ICD-10-CM | POA: Diagnosis not present

## 2024-07-09 ENCOUNTER — Telehealth (HOSPITAL_COMMUNITY): Payer: Self-pay

## 2024-07-09 DIAGNOSIS — M1990 Unspecified osteoarthritis, unspecified site: Secondary | ICD-10-CM | POA: Diagnosis not present

## 2024-07-09 DIAGNOSIS — E119 Type 2 diabetes mellitus without complications: Secondary | ICD-10-CM | POA: Diagnosis not present

## 2024-07-09 DIAGNOSIS — Z7984 Long term (current) use of oral hypoglycemic drugs: Secondary | ICD-10-CM | POA: Diagnosis not present

## 2024-07-09 DIAGNOSIS — I2510 Atherosclerotic heart disease of native coronary artery without angina pectoris: Secondary | ICD-10-CM | POA: Diagnosis not present

## 2024-07-09 DIAGNOSIS — Z952 Presence of prosthetic heart valve: Secondary | ICD-10-CM | POA: Diagnosis not present

## 2024-07-09 DIAGNOSIS — Z7985 Long-term (current) use of injectable non-insulin antidiabetic drugs: Secondary | ICD-10-CM | POA: Diagnosis not present

## 2024-07-09 DIAGNOSIS — Z7982 Long term (current) use of aspirin: Secondary | ICD-10-CM | POA: Diagnosis not present

## 2024-07-09 DIAGNOSIS — Z951 Presence of aortocoronary bypass graft: Secondary | ICD-10-CM | POA: Diagnosis not present

## 2024-07-09 DIAGNOSIS — I350 Nonrheumatic aortic (valve) stenosis: Secondary | ICD-10-CM | POA: Diagnosis not present

## 2024-07-09 DIAGNOSIS — Z48812 Encounter for surgical aftercare following surgery on the circulatory system: Secondary | ICD-10-CM | POA: Diagnosis not present

## 2024-07-09 NOTE — Telephone Encounter (Signed)
 Called patient to see if he is interested in the Cardiac Rehab Program. Patient expressed interest. Explained scheduling process and went over insurance, patient verbalized understanding. Will contact patient for scheduling once f/u has been completed.

## 2024-07-09 NOTE — Telephone Encounter (Signed)
 Pt insurance is active and benefits verified through [facility] Co-pay 0, DED 0/0 met, out of pocket $3,150/$639.47 met, co-insurance 0%. no pre-authorization required, [employer]BS 07/09/2024@11 :13, REF# [id number]   TCR/ICR? ICR Visit(date of service)limitation? No limit Can multiple codes be used on the same date of service/visit?(IF ITS A LIMIT) n/a    Is this a lifetime maximum or an annual maximum? annual Has the member used any of these services to date? no Is there a time limit (weeks/months) on start of program and/or program completion? no     Will contact patient to see if he is interested in the Cardiac Rehab Program. If interested, patient will need to complete follow up appt. Once completed, patient will be contacted for scheduling upon review by the RN Navigator.

## 2024-07-10 DIAGNOSIS — E119 Type 2 diabetes mellitus without complications: Secondary | ICD-10-CM | POA: Diagnosis not present

## 2024-07-10 DIAGNOSIS — I2510 Atherosclerotic heart disease of native coronary artery without angina pectoris: Secondary | ICD-10-CM | POA: Diagnosis not present

## 2024-07-10 DIAGNOSIS — Z48812 Encounter for surgical aftercare following surgery on the circulatory system: Secondary | ICD-10-CM | POA: Diagnosis not present

## 2024-07-10 DIAGNOSIS — Z7985 Long-term (current) use of injectable non-insulin antidiabetic drugs: Secondary | ICD-10-CM | POA: Diagnosis not present

## 2024-07-10 DIAGNOSIS — Z7984 Long term (current) use of oral hypoglycemic drugs: Secondary | ICD-10-CM | POA: Diagnosis not present

## 2024-07-10 DIAGNOSIS — Z952 Presence of prosthetic heart valve: Secondary | ICD-10-CM | POA: Diagnosis not present

## 2024-07-10 DIAGNOSIS — Z951 Presence of aortocoronary bypass graft: Secondary | ICD-10-CM | POA: Diagnosis not present

## 2024-07-10 DIAGNOSIS — Z7982 Long term (current) use of aspirin: Secondary | ICD-10-CM | POA: Diagnosis not present

## 2024-07-10 DIAGNOSIS — I350 Nonrheumatic aortic (valve) stenosis: Secondary | ICD-10-CM | POA: Diagnosis not present

## 2024-07-10 DIAGNOSIS — M1990 Unspecified osteoarthritis, unspecified site: Secondary | ICD-10-CM | POA: Diagnosis not present

## 2024-07-11 MED FILL — Potassium Chloride Inj 2 mEq/ML: INTRAVENOUS | Qty: 40 | Status: AC

## 2024-07-11 MED FILL — Heparin Sodium (Porcine) Inj 1000 Unit/ML: Qty: 1000 | Status: AC

## 2024-07-11 MED FILL — Lidocaine HCl Local Preservative Free (PF) Inj 2%: INTRAMUSCULAR | Qty: 14 | Status: AC

## 2024-07-12 ENCOUNTER — Ambulatory Visit
Payer: Self-pay | Attending: Thoracic Surgery (Cardiothoracic Vascular Surgery) | Admitting: Thoracic Surgery (Cardiothoracic Vascular Surgery)

## 2024-07-12 ENCOUNTER — Other Ambulatory Visit: Payer: Self-pay

## 2024-07-12 DIAGNOSIS — Z7984 Long term (current) use of oral hypoglycemic drugs: Secondary | ICD-10-CM | POA: Diagnosis not present

## 2024-07-12 DIAGNOSIS — I2510 Atherosclerotic heart disease of native coronary artery without angina pectoris: Secondary | ICD-10-CM

## 2024-07-12 DIAGNOSIS — Z952 Presence of prosthetic heart valve: Secondary | ICD-10-CM | POA: Diagnosis not present

## 2024-07-12 DIAGNOSIS — M1990 Unspecified osteoarthritis, unspecified site: Secondary | ICD-10-CM | POA: Diagnosis not present

## 2024-07-12 DIAGNOSIS — E119 Type 2 diabetes mellitus without complications: Secondary | ICD-10-CM | POA: Diagnosis not present

## 2024-07-12 DIAGNOSIS — Z951 Presence of aortocoronary bypass graft: Secondary | ICD-10-CM | POA: Diagnosis not present

## 2024-07-12 DIAGNOSIS — I350 Nonrheumatic aortic (valve) stenosis: Secondary | ICD-10-CM | POA: Diagnosis not present

## 2024-07-12 DIAGNOSIS — Z48812 Encounter for surgical aftercare following surgery on the circulatory system: Secondary | ICD-10-CM | POA: Diagnosis not present

## 2024-07-12 DIAGNOSIS — Z7985 Long-term (current) use of injectable non-insulin antidiabetic drugs: Secondary | ICD-10-CM | POA: Diagnosis not present

## 2024-07-12 DIAGNOSIS — Z7982 Long term (current) use of aspirin: Secondary | ICD-10-CM | POA: Diagnosis not present

## 2024-07-12 MED ORDER — TIZANIDINE HCL 2 MG PO TABS: 2.0000 mg | ORAL_TABLET | Freq: Every day | ORAL | 1 refills | Status: AC

## 2024-07-12 NOTE — Progress Notes (Signed)
     [address]       [place] [address]             [phone]       Patient: Home Provider: Office Consent for Telemedicine visit obtained.  Today's visit was completed via a real-time telehealth (see specific modality noted below). The patient/authorized person provided oral consent at the time of the visit to engage in a telemedicine encounter with the present provider at [facility]. The patient/authorized person was informed of the potential benefits, limitations, and risks of telemedicine. The patient/authorized person expressed understanding that the laws that protect confidentiality also apply to telemedicine. The patient/authorized person acknowledged understanding that telemedicine does not provide emergency services and that he or she would need to call 911 or proceed to the nearest hospital for help if such a need arose.   Total time spent in the clinical discussion 10 minutes.  Telehealth Modality: Phone visit (audio only)  I had a telephone visit with  Tim Cook who is s/p AVR/CABG.  Overall doing well.  Pain is minimal.  Ambulating well. Vitals have been stable.  Tim Cook will see us  back in 1 month with a chest x-ray for cardiac rehab clearance.  [name] [name]

## 2024-07-15 DIAGNOSIS — Z951 Presence of aortocoronary bypass graft: Secondary | ICD-10-CM | POA: Diagnosis not present

## 2024-07-15 DIAGNOSIS — Z7985 Long-term (current) use of injectable non-insulin antidiabetic drugs: Secondary | ICD-10-CM | POA: Diagnosis not present

## 2024-07-15 DIAGNOSIS — I2510 Atherosclerotic heart disease of native coronary artery without angina pectoris: Secondary | ICD-10-CM | POA: Diagnosis not present

## 2024-07-15 DIAGNOSIS — I350 Nonrheumatic aortic (valve) stenosis: Secondary | ICD-10-CM | POA: Diagnosis not present

## 2024-07-15 DIAGNOSIS — Z7984 Long term (current) use of oral hypoglycemic drugs: Secondary | ICD-10-CM | POA: Diagnosis not present

## 2024-07-15 DIAGNOSIS — E119 Type 2 diabetes mellitus without complications: Secondary | ICD-10-CM | POA: Diagnosis not present

## 2024-07-15 DIAGNOSIS — Z952 Presence of prosthetic heart valve: Secondary | ICD-10-CM | POA: Diagnosis not present

## 2024-07-15 DIAGNOSIS — M1990 Unspecified osteoarthritis, unspecified site: Secondary | ICD-10-CM | POA: Diagnosis not present

## 2024-07-15 DIAGNOSIS — Z7982 Long term (current) use of aspirin: Secondary | ICD-10-CM | POA: Diagnosis not present

## 2024-07-15 DIAGNOSIS — Z48812 Encounter for surgical aftercare following surgery on the circulatory system: Secondary | ICD-10-CM | POA: Diagnosis not present

## 2024-07-17 DIAGNOSIS — Z951 Presence of aortocoronary bypass graft: Secondary | ICD-10-CM | POA: Diagnosis not present

## 2024-07-17 DIAGNOSIS — Z48812 Encounter for surgical aftercare following surgery on the circulatory system: Secondary | ICD-10-CM | POA: Diagnosis not present

## 2024-07-17 DIAGNOSIS — Z7984 Long term (current) use of oral hypoglycemic drugs: Secondary | ICD-10-CM | POA: Diagnosis not present

## 2024-07-17 DIAGNOSIS — I350 Nonrheumatic aortic (valve) stenosis: Secondary | ICD-10-CM | POA: Diagnosis not present

## 2024-07-17 DIAGNOSIS — E119 Type 2 diabetes mellitus without complications: Secondary | ICD-10-CM | POA: Diagnosis not present

## 2024-07-17 DIAGNOSIS — Z7982 Long term (current) use of aspirin: Secondary | ICD-10-CM | POA: Diagnosis not present

## 2024-07-17 DIAGNOSIS — Z7985 Long-term (current) use of injectable non-insulin antidiabetic drugs: Secondary | ICD-10-CM | POA: Diagnosis not present

## 2024-07-17 DIAGNOSIS — Z952 Presence of prosthetic heart valve: Secondary | ICD-10-CM | POA: Diagnosis not present

## 2024-07-17 DIAGNOSIS — M1990 Unspecified osteoarthritis, unspecified site: Secondary | ICD-10-CM | POA: Diagnosis not present

## 2024-07-17 DIAGNOSIS — I2510 Atherosclerotic heart disease of native coronary artery without angina pectoris: Secondary | ICD-10-CM | POA: Diagnosis not present

## 2024-07-18 DIAGNOSIS — E119 Type 2 diabetes mellitus without complications: Secondary | ICD-10-CM | POA: Diagnosis not present

## 2024-07-18 DIAGNOSIS — Z952 Presence of prosthetic heart valve: Secondary | ICD-10-CM | POA: Diagnosis not present

## 2024-07-18 DIAGNOSIS — I350 Nonrheumatic aortic (valve) stenosis: Secondary | ICD-10-CM | POA: Diagnosis not present

## 2024-07-18 DIAGNOSIS — Z48812 Encounter for surgical aftercare following surgery on the circulatory system: Secondary | ICD-10-CM | POA: Diagnosis not present

## 2024-07-18 DIAGNOSIS — M1990 Unspecified osteoarthritis, unspecified site: Secondary | ICD-10-CM | POA: Diagnosis not present

## 2024-07-18 DIAGNOSIS — Z7985 Long-term (current) use of injectable non-insulin antidiabetic drugs: Secondary | ICD-10-CM | POA: Diagnosis not present

## 2024-07-18 DIAGNOSIS — I2510 Atherosclerotic heart disease of native coronary artery without angina pectoris: Secondary | ICD-10-CM | POA: Diagnosis not present

## 2024-07-18 DIAGNOSIS — Z951 Presence of aortocoronary bypass graft: Secondary | ICD-10-CM | POA: Diagnosis not present

## 2024-07-18 DIAGNOSIS — Z7984 Long term (current) use of oral hypoglycemic drugs: Secondary | ICD-10-CM | POA: Diagnosis not present

## 2024-07-18 DIAGNOSIS — Z7982 Long term (current) use of aspirin: Secondary | ICD-10-CM | POA: Diagnosis not present

## 2024-07-19 ENCOUNTER — Other Ambulatory Visit (HOSPITAL_COMMUNITY): Payer: Self-pay

## 2024-07-20 DIAGNOSIS — Z952 Presence of prosthetic heart valve: Secondary | ICD-10-CM | POA: Diagnosis not present

## 2024-07-20 DIAGNOSIS — E119 Type 2 diabetes mellitus without complications: Secondary | ICD-10-CM | POA: Diagnosis not present

## 2024-07-20 DIAGNOSIS — Z48812 Encounter for surgical aftercare following surgery on the circulatory system: Secondary | ICD-10-CM | POA: Diagnosis not present

## 2024-07-20 DIAGNOSIS — Z7985 Long-term (current) use of injectable non-insulin antidiabetic drugs: Secondary | ICD-10-CM | POA: Diagnosis not present

## 2024-07-20 DIAGNOSIS — Z7984 Long term (current) use of oral hypoglycemic drugs: Secondary | ICD-10-CM | POA: Diagnosis not present

## 2024-07-20 DIAGNOSIS — Z7982 Long term (current) use of aspirin: Secondary | ICD-10-CM | POA: Diagnosis not present

## 2024-07-20 DIAGNOSIS — I350 Nonrheumatic aortic (valve) stenosis: Secondary | ICD-10-CM | POA: Diagnosis not present

## 2024-07-20 DIAGNOSIS — I2510 Atherosclerotic heart disease of native coronary artery without angina pectoris: Secondary | ICD-10-CM | POA: Diagnosis not present

## 2024-07-20 DIAGNOSIS — Z951 Presence of aortocoronary bypass graft: Secondary | ICD-10-CM | POA: Diagnosis not present

## 2024-07-20 DIAGNOSIS — M1990 Unspecified osteoarthritis, unspecified site: Secondary | ICD-10-CM | POA: Diagnosis not present

## 2024-07-23 ENCOUNTER — Encounter: Payer: Self-pay | Admitting: Cardiology

## 2024-07-23 ENCOUNTER — Ambulatory Visit: Attending: Cardiology | Admitting: Cardiology

## 2024-07-23 VITALS — BP 120/80 | HR 98 | Ht 69.0 in | Wt 228.0 lb

## 2024-07-23 DIAGNOSIS — I10 Essential (primary) hypertension: Secondary | ICD-10-CM

## 2024-07-23 DIAGNOSIS — Z951 Presence of aortocoronary bypass graft: Secondary | ICD-10-CM | POA: Insufficient documentation

## 2024-07-23 DIAGNOSIS — I2510 Atherosclerotic heart disease of native coronary artery without angina pectoris: Secondary | ICD-10-CM | POA: Diagnosis not present

## 2024-07-23 DIAGNOSIS — E782 Mixed hyperlipidemia: Secondary | ICD-10-CM | POA: Diagnosis not present

## 2024-07-23 NOTE — Progress Notes (Unsigned)
 Cardiology Office Note:    Date:  07/23/2024   ID:  Tim Cook, DOB 1961-07-30, MRN [id number]  PCP:  Cristopher Bottcher, NP  Cardiologist:  Lamar Fitch, MD    Referring MD: Cristopher Bottcher, NP   Chief Complaint  Patient presents with   Follow-up    History of Present Illness:    Tim Cook is a 63 y.o. male past medical history significant for coronary disease in 2017 he required stent to the proximal portion of LAD and mid LAD for 90% stenosis, additional problem include essential hypertension diabetes dyslipidemia obstructive sleep apnea he also was found to have severe aortic stenosis and 3 weeks ago he underwent aortic valve replacement with 25 mm Inspira valve with 2 venous grafts to obtuse marginal 2 and obtuse marginal 3.  He comes today for follow-up.  Doing fine recovering nicely denies having any chest pain tightness squeezing pressure burning chest except for expected and anticipated pain after surgery.  He is trying to be careful with walking around.  But overall seems to be doing better.  Does have some difficulty sleeping still sleeps in the chair  Past Medical History:  Diagnosis Date   Acute lateral meniscal tear 06/09/2016   Arthritis    knees, lower back, hands (05/18/2016)   Atherosclerotic heart disease of native coronary artery without angina pectoris    05/18/16  Cath severe 90% LAD prox, 99% mid, Circ dominant with 30% OM1, 50% OM2, 30% OM3, 60% distal circ and PL, severe stenosis nondominant RCA.  EF 55%  3.0x 20, 2.5 x 20 mm, 2.25 x 16 mm Promus premier sent to LAD Dr. Verlin    Bariatric surgery status 05/18/2016   Benign essential hypertension 01/28/2021   Bursitis of right shoulder 01/21/2019   CAD (coronary artery disease), native coronary artery    05/18/16  Cath severe 90% LAD prox, 99% mid, Circ dominant with 30% OM1, 50% OM2, 30% OM3, 60% distal circ and PL, severe stenosis nondominant RCA.  EF 55%  3.0x 20, 2.5 x 20 mm, 2.25 x 16 mm Promus  premier sent to LAD Dr. Verlin    Chronic bronchitis (HCC)    most years (05/18/2016)   Chronic lower back pain    Closed fracture of right distal radius 03/12/2020   Closed fracture of right proximal humerus 03/12/2020   DDD (degenerative disc disease), lumbar    Degenerative arthritis of right knee 05/09/2017   Degenerative disc disease, lumbar 07/07/2016   Degenerative lumbar spinal stenosis 04/07/2021   Diabetes mellitus without complication (HCC)    Dislocation of left middle finger 01/21/2019   Encounter for immunization 01/28/2021   Erectile dysfunction 01/28/2021   Gastro-esophageal reflux 01/28/2021   History of nutritional deficiency 10/26/2021   Hyperglycemia due to type 2 diabetes mellitus (HCC) 01/28/2021   Hyperlipidemia    Hypertensive heart disease without CHF    Lumbago with sciatica, right side 04/07/2021   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 type 2 diabetes mellitus (HCC) 01/28/2021   Mixed hyperlipidemia 05/18/2016   Nonallopathic lesion of lumbosacral region 07/07/2016   Nonallopathic lesion of sacral region 07/07/2016   Nonallopathic lesion of thoracic region 07/07/2016   Obesity    Radiculopathy, lumbar region 04/07/2021   Shoulder joint pain 01/28/2021   Type II diabetes mellitus (HCC)    Unstable angina (HCC)    Vitamin D  deficiency 01/28/2021    Past Surgical History:  Procedure Laterality Date   AORTIC VALVE REPLACEMENT N/A  07/02/2024   Procedure: REPLACEMENT, AORTIC VALVE, OPEN USING INSPIRIS;  Surgeon: Shyrl Linnie KIDD, MD;  Location: [facility] OR;  Service: Open Heart Surgery;  Laterality: N/A;   CARDIAC CATHETERIZATION N/A 05/18/2016   Procedure: Left Heart Cath and Coronary Angiography;  Surgeon: Lonni JONETTA Cash, MD;  Location: [facility] INVASIVE CV LAB;  Service: Cardiovascular;  Laterality: N/A;   CARDIAC CATHETERIZATION N/A 05/18/2016   Procedure: Coronary Stent Intervention;  Surgeon:  Lonni JONETTA Cash, MD;  Location: [facility] INVASIVE CV LAB;  Service: Cardiovascular;  Laterality: N/A;   CARPAL TUNNEL RELEASE Right 03/17/2020   Procedure: CARPAL TUNNEL RELEASE;  Surgeon: Josefina Chew, MD;  Location: [facility] ORS;  Service: Orthopedics;  Laterality: Right;   CORONARY ANGIOPLASTY     CORONARY ARTERY BYPASS GRAFT N/A 07/02/2024   Procedure: CORONARY ARTERY BYPASS GRAFTING (CABG) X2 USING ENDOSCOPICALLY HARVESTED RIGHT GREATER SAPHENOUS VEIN;  Surgeon: Shyrl Linnie KIDD, MD;  Location: [facility] OR;  Service: Open Heart Surgery;  Laterality: N/A;   FOOT FRACTURE SURGERY Right 1970s   INTRAOPERATIVE TRANSESOPHAGEAL ECHOCARDIOGRAM N/A 07/02/2024   Procedure: ECHOCARDIOGRAM, TRANSESOPHAGEAL, INTRAOPERATIVE;  Surgeon: Shyrl Linnie KIDD, MD;  Location: [facility] OR;  Service: Open Heart Surgery;  Laterality: N/A;   LAPAROSCOPIC GASTRIC BANDING  07/2007   by Dr. Gladis   ORIF HUMERUS FRACTURE Right 03/17/2020   Procedure: OPEN REDUCTION INTERNAL FIXATION (ORIF) PROXIMAL HUMERUS FRACTURE AND DISTAL RADIUS FRACTURE;  Surgeon: Josefina Chew, MD;  Location: [facility] ORS;  Service: Orthopedics;  Laterality: Right;   PILONIDAL CYST EXCISION  ~ 1985   took 8 out all at once   RIGHT HEART CATH N/A 07/01/2024   Procedure: RIGHT HEART CATH;  Surgeon: Zenaida Morene PARAS, MD;  Location: [facility] INVASIVE CV LAB;  Service: Cardiovascular;  Laterality: N/A;   RIGHT/LEFT HEART CATH AND CORONARY ANGIOGRAPHY N/A 05/14/2024   Procedure: RIGHT/LEFT HEART CATH AND CORONARY ANGIOGRAPHY;  Surgeon: Wonda Sharper, MD;  Location: [facility] INVASIVE CV LAB;  Service: Cardiovascular;  Laterality: N/A;   SQUAMOUS CELL CARCINOMA EXCISION     right upper arm, left shoulder    Current Medications: Current Meds  Medication Sig   acetaminophen  (TYLENOL ) 325 MG tablet Take 2 tablets (650 mg total) by mouth every 6 (six) hours as needed for mild pain (pain score 1-3).   aspirin  EC 325 MG tablet Take 1 tablet (325 mg total) by mouth daily.    atorvastatin  (LIPITOR) 40 MG tablet Take 40 mg by mouth daily.   cetirizine (ZYRTEC) 10 MG tablet Take 10 mg by mouth daily.   diphenhydrAMINE  (BENADRYL ) 25 mg capsule Take 1 capsule (25 mg total) by mouth every 8 (eight) hours as needed for itching.   FARXIGA  10 MG TABS tablet Take 10 mg by mouth daily.    furosemide  (LASIX ) 40 MG tablet Take 1 tablet (40 mg total) by mouth daily. Take for 10 days then stop   gabapentin  (NEURONTIN ) 100 MG capsule TAKE 2 CAPSULES(200 MG) BY MOUTH AT BEDTIME (Patient taking differently: Take 400 mg by mouth at bedtime.)   metFORMIN  (GLUCOPHAGE ) 500 MG tablet Take 1,000 mg by mouth 2 (two) times daily with a meal.   metoprolol  tartrate (LOPRESSOR ) 25 MG tablet Take 1.5 tablets (37.5 mg total) by mouth 2 (two) times daily.   mometasone (NASONEX) 50 MCG/ACT nasal spray Place 2 sprays into the nose daily as needed (Rhinitis).   Multiple Vitamin (MULTIVITAMIN) capsule Take 1 capsule by mouth daily.   potassium chloride  SA (KLOR-CON  M) 20 MEQ tablet Take 1 tablet (  20 mEq total) by mouth daily. Take for 10 days then stop   tirzepatide (MOUNJARO) 12.5 MG/0.5ML Pen Inject 12.5 mg into the skin once a week.   tiZANidine  (ZANAFLEX ) 2 MG tablet Take 1 tablet (2 mg total) by mouth at bedtime.   traMADol (ULTRAM) 50 MG tablet Take 1 tablet (50 mg total) by mouth every 6 (six) hours as needed for severe pain (pain score 7-10).     Allergies:   Patient has no known allergies.   Social History   Socioeconomic History   Marital status: Married    Spouse name: Not on file   Number of children: Not on file   Years of education: Not on file   Highest education level: Not on file  Occupational History   Not on file  Tobacco Use   Smoking status: Former    Current packs/day: 0.00    Average packs/day: 0.5 packs/day for 12.0 years (6.0 ttl pk-yrs)    Types: Cigarettes    Start date: 12/15/1979    Quit date: 12/15/1991    Years since quitting: 32.6   Smokeless tobacco:  Former    Types: Snuff   Tobacco comments:    quit snuff in ~ 1981  Vaping Use   Vaping status: Never Used  Substance and Sexual Activity   Alcohol use: Yes    Alcohol/week: 7.0 standard drinks of alcohol    Types: 7 Cans of beer per week    Comment: More social use   Drug use: No   Sexual activity: Yes  Other Topics Concern   Not on file  Social History Narrative   Not on file   Social Drivers of Health   Financial Resource Strain: Not on file  Food Insecurity: No Food Insecurity (07/01/2024)   Hunger Vital Sign    Worried About Running Out of Food in the Last Year: Never true    Ran Out of Food in the Last Year: Never true  Transportation Needs: No Transportation Needs (07/01/2024)   PRAPARE - Administrator, Civil Service (Medical): No    Lack of Transportation (Non-Medical): No  Physical Activity: Not on file  Stress: Not on file  Social Connections: Not on file     Family History: The patient's family history includes Diabetes Mellitus I in his sister; Heart attack in his father; Heart disease in his father, mother, and sister; Stroke in his brother and father. ROS:   Please see the history of present illness.    All 14 point review of systems negative except as described per history of present illness  EKGs/Labs/Other Studies Reviewed:         Recent Labs: 07/01/2024: ALT 26; B Natriuretic Peptide 19.4 07/03/2024: Magnesium  2.0 07/05/2024: Hemoglobin 10.4; Platelets 146 07/07/2024: BUN 15; Creatinine, Ser 1.31; Potassium 4.5; Sodium 136  Recent Lipid Panel    Component Value Date/Time   CHOL 103 07/02/2024 0437   CHOL 125 12/07/2018 1520   TRIG 78 07/02/2024 0437   HDL 43 07/02/2024 0437   HDL 57 12/07/2018 1520   CHOLHDL 2.4 07/02/2024 0437   VLDL 16 07/02/2024 0437   LDLCALC 44 07/02/2024 0437   LDLCALC 47 12/07/2018 1520    Physical Exam:    VS:  BP 120/80   Pulse 98   Ht 5' 9 (1.753 m)   Wt 228 lb (103.4 kg)   SpO2 99%   BMI  33.67 kg/m     Wt Readings from Last 3 Encounters:  07/23/24 228 lb (103.4 kg)  07/07/24 233 lb 4 oz (105.8 kg)  05/31/24 227 lb (103 kg)     GEN:  Well nourished, well developed in no acute distress HEENT: Normal NECK: No JVD; No carotid bruits LYMPHATICS: No lymphadenopathy CARDIAC: RRR, no murmurs, no rubs, no gallops RESPIRATORY:  Clear to auscultation without rales, wheezing or rhonchi  ABDOMEN: Soft, non-tender, non-distended MUSCULOSKELETAL:  No edema; No deformity  SKIN: Warm and dry LOWER EXTREMITIES: no swelling NEUROLOGIC:  Alert and oriented x 3 PSYCHIATRIC:  Normal affect   ASSESSMENT:    1. Status post coronary artery bypass graft SVG to obtuse marginal 2, SVG to obtuse marginal 3   2. Mixed hyperlipidemia   3. Benign essential hypertension   4. Coronary artery disease involving native coronary artery of native heart without angina pectoris    PLAN:    In order of problems listed above:  Status post aortic valve replacement, doing well, recovering, valve sounds beautiful he is scheduled to have echocardiogram done within a few weeks we will wait for results of the test. Cardiomyopathy before aortic valve replacement he did have mildly reduced left ventricle ejection fraction we will wait for echocardiogram to see if there is any improvement.  I anticipate need to restart him probably with Entresto in the future. Benign essential hypertension blood pressure well-controlled continue present management. Dyslipidemia on Lipitor which I will continue. We had a long discussion about what happened To give exactly what I was procedure, what was included.   Medication Adjustments/Labs and Tests Ordered: Current medicines are reviewed at length with the patient today.  Concerns regarding medicines are outlined above.  No orders of the defined types were placed in this encounter.  Medication changes: No orders of the defined types were placed in this  encounter.   Signed, Lamar DOROTHA Fitch, MD, [facility] 07/23/2024 3:40 PM    [facility]

## 2024-07-23 NOTE — Patient Instructions (Signed)
Medication Instructions:  Your physician recommends that you continue on your current medications as directed. Please refer to the Current Medication list given to you today.  *If you need a refill on your cardiac medications before your next appointment, please call your pharmacy*   Lab Work: None Ordered If you have labs (blood work) drawn today and your tests are completely normal, you will receive your results only by: MyChart Message (if you have MyChart) OR A paper copy in the mail If you have any lab test that is abnormal or we need to change your treatment, we will call you to review the results.   Testing/Procedures: None Ordered   Follow-Up: At [facility], you and your health needs are our priority.  As part of our continuing mission to provide you with exceptional heart care, we have created designated Provider Care Teams.  These Care Teams include your primary Cardiologist (physician) and Advanced Practice Providers (APPs -  Physician Assistants and Nurse Practitioners) who all work together to provide you with the care you need, when you need it.  We recommend signing up for the patient portal called "MyChart".  Sign up information is provided on this After Visit Summary.  MyChart is used to connect with patients for Virtual Visits (Telemedicine).  Patients are able to view lab/test results, encounter notes, upcoming appointments, etc.  Non-urgent messages can be sent to your provider as well.   To learn more about what you can do with MyChart, go to [email or web address].    Your next appointment:   2 month(s)  The format for your next appointment:   In Person  Provider:   Gypsy Balsam, MD    Other Instructions NA

## 2024-07-24 ENCOUNTER — Other Ambulatory Visit: Payer: Self-pay

## 2024-07-30 ENCOUNTER — Other Ambulatory Visit: Payer: Self-pay | Admitting: Physician Assistant

## 2024-08-01 DIAGNOSIS — H43823 Vitreomacular adhesion, bilateral: Secondary | ICD-10-CM | POA: Diagnosis not present

## 2024-08-01 DIAGNOSIS — E113593 Type 2 diabetes mellitus with proliferative diabetic retinopathy without macular edema, bilateral: Secondary | ICD-10-CM | POA: Diagnosis not present

## 2024-08-01 DIAGNOSIS — H2513 Age-related nuclear cataract, bilateral: Secondary | ICD-10-CM | POA: Diagnosis not present

## 2024-08-01 DIAGNOSIS — H35372 Puckering of macula, left eye: Secondary | ICD-10-CM | POA: Diagnosis not present

## 2024-08-08 ENCOUNTER — Other Ambulatory Visit: Payer: Self-pay | Admitting: Thoracic Surgery (Cardiothoracic Vascular Surgery)

## 2024-08-08 DIAGNOSIS — I2510 Atherosclerotic heart disease of native coronary artery without angina pectoris: Secondary | ICD-10-CM

## 2024-08-09 ENCOUNTER — Ambulatory Visit
Admission: RE | Admit: 2024-08-09 | Discharge: 2024-08-09 | Disposition: A | Discharge status: Home / Self Care | Payer: Self-pay | Source: Ambulatory Visit | Attending: Cardiology | Admitting: Cardiology

## 2024-08-09 ENCOUNTER — Ambulatory Visit (INDEPENDENT_AMBULATORY_CARE_PROVIDER_SITE_OTHER): Payer: Self-pay

## 2024-08-09 VITALS — BP 131/80 | HR 92 | Resp 20 | Wt 229.3 lb

## 2024-08-09 DIAGNOSIS — I2510 Atherosclerotic heart disease of native coronary artery without angina pectoris: Secondary | ICD-10-CM | POA: Insufficient documentation

## 2024-08-09 DIAGNOSIS — Z452 Encounter for adjustment and management of vascular access device: Secondary | ICD-10-CM | POA: Diagnosis not present

## 2024-08-09 DIAGNOSIS — Z951 Presence of aortocoronary bypass graft: Secondary | ICD-10-CM | POA: Insufficient documentation

## 2024-08-09 DIAGNOSIS — Z952 Presence of prosthetic heart valve: Secondary | ICD-10-CM | POA: Diagnosis not present

## 2024-08-09 MED ORDER — PROMETHAZINE-DM 6.25-15 MG/5ML PO SYRP: 5.0000 mL | ORAL_SOLUTION | Freq: Four times a day (QID) | ORAL | 0 refills | Status: AC | PRN

## 2024-08-09 NOTE — Progress Notes (Signed)
 [address] [place] [address]             [phone]       HPI:  Patient returns for routine postoperative follow-up having undergone CABG X 2. RSVG OM2 and 3, Aortic valve replacement with a 25mm inspiris valve and Endoscopic greater saphenous vein harvest on the right on 07/02/2024 with Dr. Shyrl.  The patient's early postoperative recovery while in the hospital was notable for routine diuresis and was discharged with 10 days of PO lasix . Lopressor  was titrated due to sinus tachycardia. Home diabetes medications were restarted. He was stable for discharge home on 07/07/2024.  Since hospital discharge the patient reports that he has been doing well.  He has been experiencing a dry cough which he states happens every year at this time due to weather changes.  He has been using promethazine-DM  He is active and has been walking.  Incision sites have been healing appropriately.  He denies chest pain, shortness of breath and lower leg swelling.    Allergies as of 08/09/2024   No Known Allergies      Medication List        Accurate as of August 09, 2024 12:10 PM. If you have any questions, ask your nurse or doctor.          acetaminophen  325 MG tablet Commonly known as: Tylenol  Take 2 tablets (650 mg total) by mouth every 6 (six) hours as needed for mild pain (pain score 1-3).   aspirin  EC 325 MG tablet Take 1 tablet (325 mg total) by mouth daily.   atorvastatin  40 MG tablet Commonly known as: LIPITOR Take 40 mg by mouth daily.   cetirizine 10 MG tablet Commonly known as: ZYRTEC Take 10 mg by mouth daily.   diphenhydrAMINE  25 mg capsule Commonly known as: BENADRYL  Take 1 capsule (25 mg total) by mouth every 8 (eight) hours as needed for itching.   Farxiga  10 MG Tabs tablet Generic drug: dapagliflozin  propanediol Take 10 mg by mouth daily.   furosemide  40 MG tablet Commonly known as: LASIX  Take 1 tablet (40 mg total) by mouth  daily. Take for 10 days then stop   gabapentin  100 MG capsule Commonly known as: NEURONTIN  TAKE 2 CAPSULES(200 MG) BY MOUTH AT BEDTIME What changed: See the new instructions.   metFORMIN  500 MG tablet Commonly known as: GLUCOPHAGE  Take 1,000 mg by mouth 2 (two) times daily with a meal.   metoprolol  tartrate 25 MG tablet Commonly known as: LOPRESSOR  Take 1.5 tablets (37.5 mg total) by mouth 2 (two) times daily.   mometasone 50 MCG/ACT nasal spray Commonly known as: NASONEX Place 2 sprays into the nose daily as needed (Rhinitis).   multivitamin capsule Take 1 capsule by mouth daily.   potassium chloride  SA 20 MEQ tablet Commonly known as: KLOR-CON  M Take 1 tablet (20 mEq total) by mouth daily. Take for 10 days then stop   tirzepatide 12.5 MG/0.5ML Pen Commonly known as: MOUNJARO Inject 12.5 mg into the skin once a week.   tiZANidine  2 MG tablet Commonly known as: ZANAFLEX  Take 1 tablet (2 mg total) by mouth at bedtime.   traMADol 50 MG tablet Commonly known as: ULTRAM Take 1 tablet (50 mg total) by mouth every 6 (six) hours as needed for severe pain (pain score 7-10).         ROS Review of Systems  Constitutional: Negative.  Negative for fever  and malaise/fatigue.  Respiratory:  Positive for cough. Negative for shortness of breath.   Cardiovascular: Negative.  Negative for chest pain and leg swelling.      BP 131/80 (BP Location: Left Arm, Patient Position: Sitting, Cuff Size: Normal)   Pulse 92   Resp 20   Wt 229 lb 4.8 oz (104 kg)   SpO2 95%   BMI 33.86 kg/m   Physical Exam Constitutional:      Appearance: Normal appearance.  HENT:     Head: Normocephalic and atraumatic.  Cardiovascular:     Rate and Rhythm: Normal rate and regular rhythm.     Heart sounds: Normal heart sounds, S1 normal and S2 normal.  Pulmonary:     Effort: Pulmonary effort is normal.     Breath sounds: Normal breath sounds.  Skin:    General: Skin is warm and dry.       Neurological:     General: No focal deficit present.     Mental Status: He is alert and oriented to person, place, and time.       Imaging: EXAM: 2 VIEW(S) XRAY OF THE CHEST 08/09/2024 11:29:27 AM   COMPARISON: 07/04/2024   CLINICAL HISTORY: cabg   FINDINGS:   LINES, TUBES AND DEVICES: Interval removal of the right IJ central venous catheter.   LUNGS AND PLEURA: No focal pulmonary opacity. No pleural effusion. No pneumothorax.   HEART AND MEDIASTINUM: No cardiomegaly. CABG and aortic valve replacement changes.   BONES AND SOFT TISSUES: Multilevel thoracic osteophytosis. Old healed right rib fractures. Cholecystectomy clips. Partially visualized screw and plate fixation of the proximal right humerus.   IMPRESSION: 1. No acute cardiopulmonary abnormality.   Electronically signed by: Rogelia Myers MD 08/09/2024 11:47 AM EST RP Workstation: [id number]   Assessment/Plan:  Status post coronary artery bypass graft SVG to obtuse marginal 2, SVG to obtuse marginal 3 -We reviewed today's chest x ray.  -We discussed driving and he is able to start since he is no longer using narcotics for pain.  First time driving should be a short distance in the day time and he can increase form there -Discussed participation in cardiac rehab and he is cleared at this time -He should continue sternal precautions until a full 6 weeks from surgery. Continue to use caution with lifting -He should increase his activity/exercise as tolerated  -He has been seen by cardiology.  He is to continue with aspirin , atorvastatin , and metoprolol .  He has echo scheduled with cardiology -Diabetes managed by PCP and he should continue follow up as scheduled   S/P AVR (aortic valve replacement) 25 mm Inspiris -Discussed need for prophylactic antibiotics for all dental cleanings/procedures and minor surgeries.    -Follow up with [facility] as needed   Manuelita CHRISTELLA Rough, PA-C 12:10 PM 08/09/24

## 2024-08-09 NOTE — Patient Instructions (Signed)
 You may continue to gradually increase your physical activity as tolerated.  Refrain from any heavy lifting or strenuous use of your arms and shoulders until at least 6 weeks from the time of your surgery, and avoid activities that cause increased pain in your chest on the side of your surgical incision.  Otherwise you may continue to increase activities without any particular limitations.  Increase the intensity and duration of physical activity gradually.   You may return to driving an automobile as long as you are no longer requiring oral narcotic pain relievers during the daytime.  It would be wise to start driving only short distances during the daylight and gradually increase from there as you feel comfortable.   Endocarditis is a potentially serious infection of heart valves or inside lining of the heart.  It occurs more commonly in patients with diseased heart valves (such as patient's with aortic or mitral valve disease) and in patients who have undergone heart valve repair or replacement.  Certain surgical and dental procedures may put you at risk, such as dental cleaning, other dental procedures, or any surgery involving the respiratory, urinary, gastrointestinal tract, gallbladder or prostate gland.   To minimize your chances for develooping endocarditis, maintain good oral health and seek prompt medical attention for any infections involving the mouth, teeth, gums, skin or urinary tract.    Always notify your doctor or dentist about your underlying heart valve condition before having any invasive procedures. You will need to take antibiotics before certain procedures, including all routine dental cleanings or other dental procedures.  Your cardiologist or dentist should prescribe these antibiotics for you to be taken ahead of time.

## 2024-08-12 ENCOUNTER — Other Ambulatory Visit: Payer: Self-pay

## 2024-08-15 DIAGNOSIS — R197 Diarrhea, unspecified: Secondary | ICD-10-CM | POA: Diagnosis not present

## 2024-08-15 DIAGNOSIS — E1169 Type 2 diabetes mellitus with other specified complication: Secondary | ICD-10-CM | POA: Diagnosis not present

## 2024-08-16 ENCOUNTER — Ambulatory Visit (HOSPITAL_COMMUNITY)
Admission: RE | Admit: 2024-08-16 | Discharge: 2024-08-16 | Disposition: A | Discharge status: Home / Self Care | Source: Ambulatory Visit | Attending: Cardiology | Admitting: Cardiology

## 2024-08-16 ENCOUNTER — Telehealth (HOSPITAL_COMMUNITY): Payer: Self-pay

## 2024-08-16 DIAGNOSIS — Z952 Presence of prosthetic heart valve: Secondary | ICD-10-CM | POA: Insufficient documentation

## 2024-08-16 DIAGNOSIS — E119 Type 2 diabetes mellitus without complications: Secondary | ICD-10-CM | POA: Diagnosis not present

## 2024-08-16 LAB — ECHOCARDIOGRAM COMPLETE
AR max vel: 3.02 cm2
AV Area VTI: 3.46 cm2
AV Area mean vel: 2.77 cm2
AV Mean grad: 2.3 mmHg
AV Peak grad: 4.1 mmHg
Ao pk vel: 1.01 m/s
Area-P 1/2: 3.33 cm2
S' Lateral: 2.87 cm

## 2024-08-16 NOTE — Telephone Encounter (Signed)
 Called patient to see if he was interested in participating in the Cardiac Rehab Program. Patient will come in for orientation on 12/09 and will attend the 6:45 exercise class.  Sent MyChart message.

## 2024-08-18 DIAGNOSIS — R197 Diarrhea, unspecified: Secondary | ICD-10-CM | POA: Diagnosis not present

## 2024-08-21 ENCOUNTER — Telehealth: Payer: Self-pay

## 2024-08-21 ENCOUNTER — Ambulatory Visit: Payer: Self-pay | Admitting: Cardiology

## 2024-08-21 NOTE — Telephone Encounter (Signed)
 Pt viewed Echo results on My Chart per Dr. Vanetta Shawl note. Routed to PCP.

## 2024-08-25 ENCOUNTER — Encounter: Payer: Self-pay | Admitting: Cardiology

## 2024-08-26 ENCOUNTER — Other Ambulatory Visit: Payer: Self-pay | Admitting: Physician Assistant

## 2024-08-26 ENCOUNTER — Other Ambulatory Visit: Payer: Self-pay

## 2024-08-26 MED ORDER — METOPROLOL TARTRATE 25 MG PO TABS: 37.5000 mg | ORAL_TABLET | Freq: Two times a day (BID) | ORAL | 3 refills | Status: AC

## 2024-09-02 DIAGNOSIS — E113591 Type 2 diabetes mellitus with proliferative diabetic retinopathy without macular edema, right eye: Secondary | ICD-10-CM | POA: Diagnosis not present

## 2024-09-03 ENCOUNTER — Encounter (HOSPITAL_COMMUNITY)

## 2024-09-04 ENCOUNTER — Telehealth (HOSPITAL_COMMUNITY): Payer: Self-pay

## 2024-09-04 NOTE — Telephone Encounter (Signed)
 Confirmed cardiac orientation appointment time of 09/05/24 at 0830.  Cardiac health history completed.

## 2024-09-05 ENCOUNTER — Encounter (HOSPITAL_COMMUNITY): Admission: RE | Admit: 2024-09-05 | Discharge: 2024-09-05 | Attending: Cardiology | Admitting: Cardiology

## 2024-09-05 ENCOUNTER — Encounter (HOSPITAL_COMMUNITY): Payer: Self-pay

## 2024-09-05 VITALS — BP 110/76 | HR 97 | Ht 68.0 in | Wt 227.7 lb

## 2024-09-05 DIAGNOSIS — Z952 Presence of prosthetic heart valve: Secondary | ICD-10-CM | POA: Diagnosis present

## 2024-09-05 DIAGNOSIS — Z951 Presence of aortocoronary bypass graft: Secondary | ICD-10-CM

## 2024-09-05 NOTE — Progress Notes (Signed)
 Cardiac Rehab Medication Review by a Nurse   Does the patient  feel that his/her medications are working for him/her?  yes   Has the patient been experiencing any side effects to the medications prescribed?  no   Does the patient measure his/her own blood pressure or blood glucose at home?  yes    Does the patient have any problems obtaining medications due to transportation or finances?   no   Understanding of regimen: excellent Understanding of indications: excellent Potential of compliance: excellent     Nurse comments: Pt states, I have no questions related to my prescribed medications at this time.

## 2024-09-05 NOTE — Progress Notes (Signed)
 Cardiac Individual Treatment Plan  Patient Details  Name: Tim Cook MRN: [id number] Date of Birth: April 29, 1961 Referring Provider:   Flowsheet Row INTENSIVE CARDIAC REHAB ORIENT from 09/05/2024 in [facility]  Referring Provider Dr. Lamar Fitch MD    Initial Encounter Date:  Flowsheet Row INTENSIVE CARDIAC REHAB ORIENT from 09/05/2024 in [facility]  Date 09/05/24    Visit Diagnosis: 07/02/24 S/P aortic valve replacement  07/02/24 S/P CABG x 2  Patient's Home Medications on Admission: Current Medications[1]  Past Medical History: Past Medical History:  Diagnosis Date   Acute lateral meniscal tear 06/09/2016   Arthritis    knees, lower back, hands (05/18/2016)   Atherosclerotic heart disease of native coronary artery without angina pectoris    05/18/16  Cath severe 90% LAD prox, 99% mid, Circ dominant with 30% OM1, 50% OM2, 30% OM3, 60% distal circ and PL, severe stenosis nondominant RCA.  EF 55%  3.0x 20, 2.5 x 20 mm, 2.25 x 16 mm Promus premier sent to LAD Dr. Verlin    Bariatric surgery status 05/18/2016   Benign essential hypertension 01/28/2021   Bursitis of right shoulder 01/21/2019   CAD (coronary artery disease), native coronary artery    05/18/16  Cath severe 90% LAD prox, 99% mid, Circ dominant with 30% OM1, 50% OM2, 30% OM3, 60% distal circ and PL, severe stenosis nondominant RCA.  EF 55%  3.0x 20, 2.5 x 20 mm, 2.25 x 16 mm Promus premier sent to [facility] Dr. Verlin    Chronic bronchitis (HCC)    most years (05/18/2016)   Chronic lower back pain    Closed fracture of right distal radius 03/12/2020   Closed fracture of right proximal humerus 03/12/2020   DDD (degenerative disc disease), lumbar    Degenerative arthritis of right knee 05/09/2017   Degenerative disc disease, lumbar 07/07/2016   Degenerative lumbar spinal stenosis 04/07/2021    Diabetes mellitus without complication (HCC)    Dislocation of left middle finger 01/21/2019   Encounter for immunization 01/28/2021   Erectile dysfunction 01/28/2021   Gastro-esophageal reflux 01/28/2021   History of nutritional deficiency 10/26/2021   Hyperglycemia due to type 2 diabetes mellitus (HCC) 01/28/2021   Hyperlipidemia    Hypertensive heart disease without CHF    Lumbago with sciatica, right side 04/07/2021   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 type 2 diabetes mellitus (HCC) 01/28/2021   Mixed hyperlipidemia 05/18/2016   Nonallopathic lesion of lumbosacral region 07/07/2016   Nonallopathic lesion of sacral region 07/07/2016   Nonallopathic lesion of thoracic region 07/07/2016   Obesity    Radiculopathy, lumbar region 04/07/2021   Shoulder joint pain 01/28/2021   Type II diabetes mellitus (HCC)    Unstable angina (HCC)    Vitamin D  deficiency 01/28/2021    Tobacco Use: Tobacco Use History[2]  Labs: Review Flowsheet  More data exists      Latest Ref Rng & Units 03/16/2020 05/14/2024 07/01/2024 07/02/2024 07/03/2024  Labs for ITP Cardiac and Pulmonary Rehab  Cholestrol 0 - 200 mg/dL - - - 896  -  LDL (calc) 0 - 99 mg/dL - - - 44  -  HDL-C >59 mg/dL - - - 43  -  Trlycerides <150 mg/dL - - - 78  -  Hemoglobin A1c 4.8 - 5.6 % 6.9  - 5.4  - -  PH, Arterial 7.35 - 7.45 - 7.352  -  7.377  7.394  7.410  7.438  7.437  7.371  7.367   PCO2 arterial 32 - 48 mmHg - 42.7  - 46.3  32.0  39.0  38.6  37.4  44.3  38.8   Bicarbonate 20.0 - 28.0 mmol/L - 23.7  24.3  26.9  25.9  27.2  19.7  24.7  26.1  25.3  27.5  25.7  22.2   TCO2 22 - 32 mmol/L - 25  26  28  27  29  21  23  26  27  27  26  26  29  27  25  26  23    Acid-base deficit 0.0 - 2.0 mmol/L - 2.0  2.0  - 5.0  3.0   O2 Saturation % - 99  77  74  73  98  99  97  100  100  80  100  97     Details       Multiple values from one day are sorted in reverse-chronological order          Capillary Blood Glucose: Lab Results  Component Value Date   GLUCAP 111 (H) 07/07/2024   GLUCAP 145 (H) 07/06/2024   GLUCAP 112 (H) 07/06/2024   GLUCAP 174 (H) 07/06/2024   GLUCAP 115 (H) 07/06/2024     Exercise Target Goals: Exercise Program Goal: Individual exercise prescription set using results from initial 6 min walk test and THRR while considering  patients activity barriers and safety.   Exercise Prescription Goal: Initial exercise prescription builds to 30-45 minutes a day of aerobic activity, 2-3 days per week.  Home exercise guidelines will be given to patient during program as part of exercise prescription that the participant will acknowledge.  Activity Barriers & Risk Stratification:  Activity Barriers & Cardiac Risk Stratification - 09/05/24 1417       Activity Barriers & Cardiac Risk Stratification   Activity Barriers Balance Concerns;Joint Problems;Arthritis;Back Problems;Deconditioning;Neck/Spine Problems    Cardiac Risk Stratification High   Under 5 MET's         6 Minute Walk:  6 Minute Walk     Row Name 09/05/24 1409         6 Minute Walk   Phase Initial     Distance 1200 feet     Walk Time 6 minutes     # of Rest Breaks 0     MPH 2.27     METS 2.79     RPE 9     Perceived Dyspnea  0     VO2 Peak 9.76     Symptoms Yes (comment)     Comments LBP 4/10 lingers with rest.     Resting HR 97 bpm     Resting BP 110/76     Resting Oxygen Saturation  97 %     Exercise Oxygen Saturation  during 6 min walk 100 %     Max Ex. HR 111 bpm     Max Ex. BP 122/68     2 Minute Post BP 114/64        Oxygen Initial Assessment:   Oxygen Re-Evaluation:   Oxygen Discharge (Final Oxygen Re-Evaluation):   Initial Exercise Prescription:  Initial Exercise Prescription - 09/05/24 1400       Date of Initial Exercise RX and Referring Provider   Date 09/05/24    Referring Provider Dr. Lamar Fitch MD    Expected Discharge Date 11/28/23       NuStep  Level 1    SPM 75    Minutes 15    METs 1.8      Recumbant Elliptical   Level 1    RPM 55    Watts 70    Minutes 15    METs 2.5      Prescription Details   Frequency (times per week) 3    Duration Progress to 30 minutes of continuous aerobic without signs/symptoms of physical distress      Intensity   THRR 40-80% of Max Heartrate 63-126    Ratings of Perceived Exertion 11-13    Perceived Dyspnea 0-4      Progression   Progression Continue progressive overload as per policy without signs/symptoms or physical distress.      Resistance Training   Training Prescription Yes    Weight 3    Reps 10-15          Perform Capillary Blood Glucose checks as needed.  Exercise Prescription Changes:   Exercise Comments:   Exercise Goals and Review:   Exercise Goals     Row Name 09/05/24 1424             Exercise Goals   Increase Physical Activity Yes       Intervention Provide advice, education, support and counseling about physical activity/exercise needs.;Develop an individualized exercise prescription for aerobic and resistive training based on initial evaluation findings, risk stratification, comorbidities and participant's personal goals.       Expected Outcomes Short Term: Attend rehab on a regular basis to increase amount of physical activity.;Long Term: Exercising regularly at least 3-5 days a week.;Long Term: Add in home exercise to make exercise part of routine and to increase amount of physical activity.       Increase Strength and Stamina Yes       Intervention Provide advice, education, support and counseling about physical activity/exercise needs.;Develop an individualized exercise prescription for aerobic and resistive training based on initial evaluation findings, risk stratification, comorbidities and participant's personal goals.       Expected Outcomes Short Term: Perform resistance training exercises routinely during rehab and add in resistance  training at home;Short Term: Increase workloads from initial exercise prescription for resistance, speed, and METs.;Long Term: Improve cardiorespiratory fitness, muscular endurance and strength as measured by increased METs and functional capacity ( )       Able to understand and use rate of perceived exertion (RPE) scale Yes       Intervention Provide education and explanation on how to use RPE scale       Expected Outcomes Short Term: Able to use RPE daily in rehab to express subjective intensity level;Long Term:  Able to use RPE to guide intensity level when exercising independently       Knowledge and understanding of Target Heart Rate Range (THRR) Yes       Intervention Provide education and explanation of THRR including how the numbers were predicted and where they are located for reference       Expected Outcomes Short Term: Able to state/look up THRR;Long Term: Able to use THRR to govern intensity when exercising independently;Short Term: Able to use daily as guideline for intensity in rehab       Understanding of Exercise Prescription Yes       Intervention Provide education, explanation, and written materials on patient's individual exercise prescription       Expected Outcomes Short Term: Able to explain program exercise prescription;Long Term: Able to explain home exercise  prescription to exercise independently          Exercise Goals Re-Evaluation :   Discharge Exercise Prescription (Final Exercise Prescription Changes):   Nutrition:  Target Goals: Understanding of nutrition guidelines, daily intake of sodium 1500mg , cholesterol 200mg , calories 30% from fat and 7% or less from saturated fats, daily to have 5 or more servings of fruits and vegetables.  Biometrics:   Post Biometrics - 09/05/24 1425        Post  Biometrics   Height 5' 8 (1.727 m)    Weight 103.3 kg    Waist Circumference 46 inches    Hip Circumference 43 inches    Waist to Hip Ratio 1.07 %    BMI  (Calculated) 34.64    Triceps Skinfold 15 mm    % Body Fat 32.8 %    Grip Strength 21 kg    Flexibility 10 in    Single Leg Stand 4 seconds          Nutrition Therapy Plan and Nutrition Goals:   Nutrition Assessments:  MEDIFICTS Score Key: >=70 Need to make dietary changes  40-70 Heart Healthy Diet <= 40 Therapeutic Level Cholesterol Diet    Picture Your Plate Scores: <59 Unhealthy dietary pattern with much room for improvement. 41-50 Dietary pattern unlikely to meet recommendations for good health and room for improvement. 51-60 More healthful dietary pattern, with some room for improvement.  >60 Healthy dietary pattern, although there may be some specific behaviors that could be improved.    Nutrition Goals Re-Evaluation:   Nutrition Goals Re-Evaluation:   Nutrition Goals Discharge (Final Nutrition Goals Re-Evaluation):   Psychosocial: Target Goals: Acknowledge presence or absence of significant depression and/or stress, maximize coping skills, provide positive support system. Participant is able to verbalize types and ability to use techniques and skills needed for reducing stress and depression.  Initial Review & Psychosocial Screening:  Initial Psych Review & Screening - 09/05/24 0836       Initial Review   Current issues with Current Sleep Concerns;Current Stress Concerns    Source of Stress Concerns Chronic Illness;Financial;Retirement/disability      Family Dynamics   Good Support System? Yes   Pt has his wife, kids, and neighbors as a part of his support system     Barriers   Psychosocial barriers to participate in program There are no identifiable barriers or psychosocial needs.;The patient should benefit from training in stress management and relaxation.      Screening Interventions   Interventions Encouraged to exercise;Provide feedback about the scores to participant    Expected Outcomes Long Term Goal: Stressors or current issues are controlled  or eliminated.;Short Term goal: Identification and review with participant of any Quality of Life or Depression concerns found by scoring the questionnaire.;Long Term goal: The participant improves quality of Life and PHQ9 Scores as seen by post scores and/or verbalization of changes          Quality of Life Scores:  Quality of Life - 09/05/24 1426       Quality of Life   Select Quality of Life      Quality of Life Scores   Health/Function Pre 23.2 %    Socioeconomic Pre 28.29 %    Psych/Spiritual Pre 25.71 %    Family Pre 26.4 %    GLOBAL Pre 25.24 %         Scores of 19 and below usually indicate a poorer quality of life in these areas.  A  difference of  2-3 points is a clinically meaningful difference.  A difference of 2-3 points in the total score of the Quality of Life Index has been associated with significant improvement in overall quality of life, self-image, physical symptoms, and general health in studies assessing change in quality of life.  PHQ-9: Review Flowsheet       09/05/2024 05/26/2017 04/12/2017  Depression screen PHQ 2/9  Decreased Interest 0 0 0  Down, Depressed, Hopeless 0 0 0  PHQ - 2 Score 0 0 0  Altered sleeping 1 - -  Tired, decreased energy 1 - -  Change in appetite 1 - -  Feeling bad or failure about yourself  0 - -  Trouble concentrating 0 - -  Moving slowly or fidgety/restless 0 - -  Suicidal thoughts 0 - -  PHQ-9 Score 3 - -  Difficult doing work/chores Not difficult at all - -   Interpretation of Total Score  Total Score Depression Severity:  1-4 = Minimal depression, 5-9 = Mild depression, 10-14 = Moderate depression, 15-19 = Moderately severe depression, 20-27 = Severe depression   Psychosocial Evaluation and Intervention:   Psychosocial Re-Evaluation:   Psychosocial Discharge (Final Psychosocial Re-Evaluation):   Vocational Rehabilitation: Provide vocational rehab assistance to qualifying candidates.   Vocational Rehab  Evaluation & Intervention:  Vocational Rehab - 09/05/24 1427       Initial Vocational Rehab Evaluation & Intervention   Assessment shows need for Vocational Rehabilitation No   No needs at this time         Education: Education Goals: Education classes will be provided on a weekly basis, covering required topics. Participant will state understanding/return demonstration of topics presented.     Core Videos: Exercise    Move It!  Clinical staff conducted group or individual video education with verbal and written material and guidebook.  Patient learns the recommended Pritikin exercise program. Exercise with the goal of living a long, healthy life. Some of the health benefits of exercise include controlled diabetes, healthier blood pressure levels, improved cholesterol levels, improved heart and lung capacity, improved sleep, and better body composition. Everyone should speak with their doctor before starting or changing an exercise routine.  Biomechanical Limitations Clinical staff conducted group or individual video education with verbal and written material and guidebook.  Patient learns how biomechanical limitations can impact exercise and how we can mitigate and possibly overcome limitations to have an impactful and balanced exercise routine.  Body Composition Clinical staff conducted group or individual video education with verbal and written material and guidebook.  Patient learns that body composition (ratio of muscle mass to fat mass) is a key component to assessing overall fitness, rather than body weight alone. Increased fat mass, especially visceral belly fat, can put us  at increased risk for metabolic syndrome, type 2 diabetes, heart disease, and even death. It is recommended to combine diet and exercise (cardiovascular and resistance training) to improve your body composition. Seek guidance from your physician and exercise physiologist before implementing an exercise  routine.  Exercise Action Plan Clinical staff conducted group or individual video education with verbal and written material and guidebook.  Patient learns the recommended strategies to achieve and enjoy long-term exercise adherence, including variety, self-motivation, self-efficacy, and positive decision making. Benefits of exercise include fitness, good health, weight management, more energy, better sleep, less stress, and overall well-being.  Medical   Heart Disease Risk Reduction Clinical staff conducted group or individual video education with verbal and written material  and guidebook.  Patient learns our heart is our most vital organ as it circulates oxygen, nutrients, white blood cells, and hormones throughout the entire body, and carries waste away. Data supports a plant-based eating plan like the Pritikin Program for its effectiveness in slowing progression of and reversing heart disease. The video provides a number of recommendations to address heart disease.   Metabolic Syndrome and Belly Fat  Clinical staff conducted group or individual video education with verbal and written material and guidebook.  Patient learns what metabolic syndrome is, how it leads to heart disease, and how one can reverse it and keep it from coming back. You have metabolic syndrome if you have 3 of the following 5 criteria: abdominal obesity, high blood pressure, high triglycerides, low HDL cholesterol, and high blood sugar.  Hypertension and Heart Disease Clinical staff conducted group or individual video education with verbal and written material and guidebook.  Patient learns that high blood pressure, or hypertension, is very common in the [place] . Hypertension is largely due to excessive salt intake, but other important risk factors include being overweight, physical inactivity, drinking too much alcohol, smoking, and not eating enough potassium from fruits and vegetables. High blood pressure is a  leading risk factor for heart attack, stroke, congestive heart failure, dementia, kidney failure, and premature death. Long-term effects of excessive salt intake include stiffening of the arteries and thickening of heart muscle and organ damage. Recommendations include ways to reduce hypertension and the risk of heart disease.  Diseases of Our Time - Focusing on Diabetes Clinical staff conducted group or individual video education with verbal and written material and guidebook.  Patient learns why the best way to stop diseases of our time is prevention, through food and other lifestyle changes. Medicine (such as prescription pills and surgeries) is often only a Band-Aid on the problem, not a long-term solution. Most common diseases of our time include obesity, type 2 diabetes, hypertension, heart disease, and cancer. The Pritikin Program is recommended and has been proven to help reduce, reverse, and/or prevent the damaging effects of metabolic syndrome.  Nutrition   Overview of the Pritikin Eating Plan  Clinical staff conducted group or individual video education with verbal and written material and guidebook.  Patient learns about the Pritikin Eating Plan for disease risk reduction. The Pritikin Eating Plan emphasizes a wide variety of unrefined, minimally-processed carbohydrates, like fruits, vegetables, whole grains, and legumes. Go, Caution, and Stop food choices are explained. Plant-based and lean animal proteins are emphasized. Rationale provided for low sodium intake for blood pressure control, low added sugars for blood sugar stabilization, and low added fats and oils for coronary artery disease risk reduction and weight management.  Calorie Density  Clinical staff conducted group or individual video education with verbal and written material and guidebook.  Patient learns about calorie density and how it impacts the Pritikin Eating Plan. Knowing the characteristics of the food you choose will  help you decide whether those foods will lead to weight gain or weight loss, and whether you want to consume more or less of them. Weight loss is usually a side effect of the Pritikin Eating Plan because of its focus on low calorie-dense foods.  Label Reading  Clinical staff conducted group or individual video education with verbal and written material and guidebook.  Patient learns about the Pritikin recommended label reading guidelines and corresponding recommendations regarding calorie density, added sugars, sodium content, and whole grains.  Dining Out - Part 1  Clinical staff conducted group or individual video education with verbal and written material and guidebook.  Patient learns that restaurant meals can be sabotaging because they can be so high in calories, fat, sodium, and/or sugar. Patient learns recommended strategies on how to positively address this and avoid unhealthy pitfalls.  Facts on Fats  Clinical staff conducted group or individual video education with verbal and written material and guidebook.  Patient learns that lifestyle modifications can be just as effective, if not more so, as many medications for lowering your risk of heart disease. A Pritikin lifestyle can help to reduce your risk of inflammation and atherosclerosis (cholesterol build-up, or plaque, in the artery walls). Lifestyle interventions such as dietary choices and physical activity address the cause of atherosclerosis. A review of the types of fats and their impact on blood cholesterol levels, along with dietary recommendations to reduce fat intake is also included.  Nutrition Action Plan  Clinical staff conducted group or individual video education with verbal and written material and guidebook.  Patient learns how to incorporate Pritikin recommendations into their lifestyle. Recommendations include planning and keeping personal health goals in mind as an important part of their success.  Healthy Mind-Set     Healthy Minds, Bodies, Hearts  Clinical staff conducted group or individual video education with verbal and written material and guidebook.  Patient learns how to identify when they are stressed. Video will discuss the impact of that stress, as well as the many benefits of stress management. Patient will also be introduced to stress management techniques. The way we think, act, and feel has an impact on our hearts.  How Our Thoughts Can Heal Our Hearts  Clinical staff conducted group or individual video education with verbal and written material and guidebook.  Patient learns that negative thoughts can cause depression and anxiety. This can result in negative lifestyle behavior and serious health problems. Cognitive behavioral therapy is an effective method to help control our thoughts in order to change and improve our emotional outlook.  Additional Videos:  Exercise    Improving Performance  Clinical staff conducted group or individual video education with verbal and written material and guidebook.  Patient learns to use a non-linear approach by alternating intensity levels and lengths of time spent exercising to help burn more calories and lose more body fat. Cardiovascular exercise helps improve heart health, metabolism, hormonal balance, blood sugar control, and recovery from fatigue. Resistance training improves strength, endurance, balance, coordination, reaction time, metabolism, and muscle mass. Flexibility exercise improves circulation, posture, and balance. Seek guidance from your physician and exercise physiologist before implementing an exercise routine and learn your capabilities and proper form for all exercise.  Introduction to Yoga  Clinical staff conducted group or individual video education with verbal and written material and guidebook.  Patient learns about yoga, a discipline of the coming together of mind, breath, and body. The benefits of yoga include improved flexibility,  improved range of motion, better posture and core strength, increased lung function, weight loss, and positive self-image. Yogas heart health benefits include lowered blood pressure, healthier heart rate, decreased cholesterol and triglyceride levels, improved immune function, and reduced stress. Seek guidance from your physician and exercise physiologist before implementing an exercise routine and learn your capabilities and proper form for all exercise.  Medical   Aging: Enhancing Your Quality of Life  Clinical staff conducted group or individual video education with verbal and written material and guidebook.  Patient learns key strategies and recommendations to stay  in good physical health and enhance quality of life, such as prevention strategies, having an advocate, securing a Health Care Proxy and Power of Attorney, and keeping a list of medications and system for tracking them. It also discusses how to avoid risk for bone loss.  Biology of Weight Control  Clinical staff conducted group or individual video education with verbal and written material and guidebook.  Patient learns that weight gain occurs because we consume more calories than we burn (eating more, moving less). Even if your body weight is normal, you may have higher ratios of fat compared to muscle mass. Too much body fat puts you at increased risk for cardiovascular disease, heart attack, stroke, type 2 diabetes, and obesity-related cancers. In addition to exercise, following the Pritikin Eating Plan can help reduce your risk.  Decoding Lab Results  Clinical staff conducted group or individual video education with verbal and written material and guidebook.  Patient learns that lab test reflects one measurement whose values change over time and are influenced by many factors, including medication, stress, sleep, exercise, food, hydration, pre-existing medical conditions, and more. It is recommended to use the knowledge from this  video to become more involved with your lab results and evaluate your numbers to speak with your doctor.   Diseases of Our Time - Overview  Clinical staff conducted group or individual video education with verbal and written material and guidebook.  Patient learns that according to the CDC, 50% to 70% of chronic diseases (such as obesity, type 2 diabetes, elevated lipids, hypertension, and heart disease) are avoidable through lifestyle improvements including healthier food choices, listening to satiety cues, and increased physical activity.  Sleep Disorders Clinical staff conducted group or individual video education with verbal and written material and guidebook.  Patient learns how good quality and duration of sleep are important to overall health and well-being. Patient also learns about sleep disorders and how they impact health along with recommendations to address them, including discussing with a physician.  Nutrition  Dining Out - Part 2 Clinical staff conducted group or individual video education with verbal and written material and guidebook.  Patient learns how to plan ahead and communicate in order to maximize their dining experience in a healthy and nutritious manner. Included are recommended food choices based on the type of restaurant the patient is visiting.   Fueling a Banker conducted group or individual video education with verbal and written material and guidebook.  There is a strong connection between our food choices and our health. Diseases like obesity and type 2 diabetes are very prevalent and are in large-part due to lifestyle choices. The Pritikin Eating Plan provides plenty of food and hunger-curbing satisfaction. It is easy to follow, affordable, and helps reduce health risks.  Menu Workshop  Clinical staff conducted group or individual video education with verbal and written material and guidebook.  Patient learns that restaurant meals can  sabotage health goals because they are often packed with calories, fat, sodium, and sugar. Recommendations include strategies to plan ahead and to communicate with the manager, chef, or server to help order a healthier meal.  Planning Your Eating Strategy  Clinical staff conducted group or individual video education with verbal and written material and guidebook.  Patient learns about the Pritikin Eating Plan and its benefit of reducing the risk of disease. The Pritikin Eating Plan does not focus on calories. Instead, it emphasizes high-quality, nutrient-rich foods. By knowing the characteristics of the foods,  we choose, we can determine their calorie density and make informed decisions.  Targeting Your Nutrition Priorities  Clinical staff conducted group or individual video education with verbal and written material and guidebook.  Patient learns that lifestyle habits have a tremendous impact on disease risk and progression. This video provides eating and physical activity recommendations based on your personal health goals, such as reducing LDL cholesterol, losing weight, preventing or controlling type 2 diabetes, and reducing high blood pressure.  Vitamins and Minerals  Clinical staff conducted group or individual video education with verbal and written material and guidebook.  Patient learns different ways to obtain key vitamins and minerals, including through a recommended healthy diet. It is important to discuss all supplements you take with your doctor.   Healthy Mind-Set    Smoking Cessation  Clinical staff conducted group or individual video education with verbal and written material and guidebook.  Patient learns that cigarette smoking and tobacco addiction pose a serious health risk which affects millions of people. Stopping smoking will significantly reduce the risk of heart disease, lung disease, and many forms of cancer. Recommended strategies for quitting are covered, including  working with your doctor to develop a successful plan.  Culinary   Becoming a Set Designer conducted group or individual video education with verbal and written material and guidebook.  Patient learns that cooking at home can be healthy, cost-effective, quick, and puts them in control. Keys to cooking healthy recipes will include looking at your recipe, assessing your equipment needs, planning ahead, making it simple, choosing cost-effective seasonal ingredients, and limiting the use of added fats, salts, and sugars.  Cooking - Breakfast and Snacks  Clinical staff conducted group or individual video education with verbal and written material and guidebook.  Patient learns how important breakfast is to satiety and nutrition through the entire day. Recommendations include key foods to eat during breakfast to help stabilize blood sugar levels and to prevent overeating at meals later in the day. Planning ahead is also a key component.  Cooking - Educational Psychologist conducted group or individual video education with verbal and written material and guidebook.  Patient learns eating strategies to improve overall health, including an approach to cook more at home. Recommendations include thinking of animal protein as a side on your plate rather than center stage and focusing instead on lower calorie dense options like vegetables, fruits, whole grains, and plant-based proteins, such as beans. Making sauces in large quantities to freeze for later and leaving the skin on your vegetables are also recommended to maximize your experience.  Cooking - Healthy Salads and Dressing Clinical staff conducted group or individual video education with verbal and written material and guidebook.  Patient learns that vegetables, fruits, whole grains, and legumes are the foundations of the Pritikin Eating Plan. Recommendations include how to incorporate each of these in flavorful and healthy  salads, and how to create homemade salad dressings. Proper handling of ingredients is also covered. Cooking - Soups and [employer] - Soups and Desserts Clinical staff conducted group or individual video education with verbal and written material and guidebook.  Patient learns that Pritikin soups and desserts make for easy, nutritious, and delicious snacks and meal components that are low in sodium, fat, sugar, and calorie density, while high in vitamins, minerals, and filling fiber. Recommendations include simple and healthy ideas for soups and desserts.   Overview     The Pritikin Solution Program Overview Clinical  staff conducted group or individual video education with verbal and written material and guidebook.  Patient learns that the results of the Pritikin Program have been documented in more than 100 articles published in peer-reviewed journals, and the benefits include reducing risk factors for (and, in some cases, even reversing) high cholesterol, high blood pressure, type 2 diabetes, obesity, and more! An overview of the three key pillars of the Pritikin Program will be covered: eating well, doing regular exercise, and having a healthy mind-set.  WORKSHOPS  Exercise: Exercise Basics: Building Your Action Plan Clinical staff led group instruction and group discussion with PowerPoint presentation and patient guidebook. To enhance the learning environment the use of posters, models and videos may be added. At the conclusion of this workshop, patients will comprehend the difference between physical activity and exercise, as well as the benefits of incorporating both, into their routine. Patients will understand the FITT (Frequency, Intensity, Time, and Type) principle and how to use it to build an exercise action plan. In addition, safety concerns and other considerations for exercise and cardiac rehab will be addressed by the presenter. The purpose of this lesson is to promote a  comprehensive and effective weekly exercise routine in order to improve patients overall level of fitness.   Managing Heart Disease: Your Path to a Healthier Heart Clinical staff led group instruction and group discussion with PowerPoint presentation and patient guidebook. To enhance the learning environment the use of posters, models and videos may be added.At the conclusion of this workshop, patients will understand the anatomy and physiology of the heart. Additionally, they will understand how Pritikins three pillars impact the risk factors, the progression, and the management of heart disease.  The purpose of this lesson is to provide a high-level overview of the heart, heart disease, and how the Pritikin lifestyle positively impacts risk factors.  Exercise Biomechanics Clinical staff led group instruction and group discussion with PowerPoint presentation and patient guidebook. To enhance the learning environment the use of posters, models and videos may be added. Patients will learn how the structural parts of their bodies function and how these functions impact their daily activities, movement, and exercise. Patients will learn how to promote a neutral spine, learn how to manage pain, and identify ways to improve their physical movement in order to promote healthy living. The purpose of this lesson is to expose patients to common physical limitations that impact physical activity. Participants will learn practical ways to adapt and manage aches and pains, and to minimize their effect on regular exercise. Patients will learn how to maintain good posture while sitting, walking, and lifting.  Balance Training and Fall Prevention  Clinical staff led group instruction and group discussion with PowerPoint presentation and patient guidebook. To enhance the learning environment the use of posters, models and videos may be added. At the conclusion of this workshop, patients will understand the  importance of their sensorimotor skills (vision, proprioception, and the vestibular system) in maintaining their ability to balance as they age. Patients will apply a variety of balancing exercises that are appropriate for their current level of function. Patients will understand the common causes for poor balance, possible solutions to these problems, and ways to modify their physical environment in order to minimize their fall risk. The purpose of this lesson is to teach patients about the importance of maintaining balance as they age and ways to minimize their risk of falling.  WORKSHOPS   Nutrition:  Fueling a Ship Broker led  group instruction and group discussion with PowerPoint presentation and patient guidebook. To enhance the learning environment the use of posters, models and videos may be added. Patients will review the foundational principles of the Pritikin Eating Plan and understand what constitutes a serving size in each of the food groups. Patients will also learn Pritikin-friendly foods that are better choices when away from home and review make-ahead meal and snack options. Calorie density will be reviewed and applied to three nutrition priorities: weight maintenance, weight loss, and weight gain. The purpose of this lesson is to reinforce (in a group setting) the key concepts around what patients are recommended to eat and how to apply these guidelines when away from home by planning and selecting Pritikin-friendly options. Patients will understand how calorie density may be adjusted for different weight management goals.  Mindful Eating  Clinical staff led group instruction and group discussion with PowerPoint presentation and patient guidebook. To enhance the learning environment the use of posters, models and videos may be added. Patients will briefly review the concepts of the Pritikin Eating Plan and the importance of low-calorie dense foods. The concept of mindful  eating will be introduced as well as the importance of paying attention to internal hunger signals. Triggers for non-hunger eating and techniques for dealing with triggers will be explored. The purpose of this lesson is to provide patients with the opportunity to review the basic principles of the Pritikin Eating Plan, discuss the value of eating mindfully and how to measure internal cues of hunger and fullness using the Hunger Scale. Patients will also discuss reasons for non-hunger eating and learn strategies to use for controlling emotional eating.  Targeting Your Nutrition Priorities Clinical staff led group instruction and group discussion with PowerPoint presentation and patient guidebook. To enhance the learning environment the use of posters, models and videos may be added. Patients will learn how to determine their genetic susceptibility to disease by reviewing their family history. Patients will gain insight into the importance of diet as part of an overall healthy lifestyle in mitigating the impact of genetics and other environmental insults. The purpose of this lesson is to provide patients with the opportunity to assess their personal nutrition priorities by looking at their family history, their own health history and current risk factors. Patients will also be able to discuss ways of prioritizing and modifying the Pritikin Eating Plan for their highest risk areas  Menu  Clinical staff led group instruction and group discussion with PowerPoint presentation and patient guidebook. To enhance the learning environment the use of posters, models and videos may be added. Using menus brought in from [employer], or printed from [employer], patients will apply the Pritikin dining out guidelines that were presented in the [employer] video. Patients will also be able to practice these guidelines in a variety of provided scenarios. The purpose of this lesson is to provide  patients with the opportunity to practice hands-on learning of the Pritikin Dining Out guidelines with actual menus and practice scenarios.  Label Reading Clinical staff led group instruction and group discussion with PowerPoint presentation and patient guidebook. To enhance the learning environment the use of posters, models and videos may be added. Patients will review and discuss the Pritikin label reading guidelines presented in Pritikins Label Reading Educational series video. Using fool labels brought in from local grocery stores and markets, patients will apply the label reading guidelines and determine if the packaged food meet the Pritikin guidelines. The purpose  of this lesson is to provide patients with the opportunity to review, discuss, and practice hands-on learning of the Pritikin Label Reading guidelines with actual packaged food labels. Cooking School  Pritikins [employer] are designed to teach patients ways to prepare quick, simple, and affordable recipes at home. The importance of nutritions role in chronic disease risk reduction is reflected in its emphasis in the overall Pritikin program. By learning how to prepare essential core Pritikin Eating Plan recipes, patients will increase control over what they eat; be able to customize the flavor of foods without the use of added salt, sugar, or fat; and improve the quality of the food they consume. By learning a set of core recipes which are easily assembled, quickly prepared, and affordable, patients are more likely to prepare more healthy foods at home. These workshops focus on convenient breakfasts, simple entres, side dishes, and desserts which can be prepared with minimal effort and are consistent with nutrition recommendations for cardiovascular risk reduction. Cooking [employer] are taught by a armed forces logistics/support/administrative officer (RD) who has been trained by the [employer]. The chef or RD has a clear  understanding of the importance of minimizing - if not completely eliminating - added fat, sugar, and sodium in recipes. Throughout the series of Cooking School Workshop sessions, patients will learn about healthy ingredients and efficient methods of cooking to build confidence in their capability to prepare    Cooking School weekly topics:  Adding Flavor- Sodium-Free  Fast and Healthy Breakfasts  Powerhouse Plant-Based Proteins  Satisfying Salads and Dressings  Simple Sides and Sauces  International Cuisine-Spotlight on the [employer] Zones  Delicious Desserts  Savory Soups  [employer] - Meals in a Astronomer Appetizers and Snacks  Comforting Weekend Breakfasts  One-Pot Wonders   Fast Evening Meals  Landscape Architect Your Pritikin Plate  WORKSHOPS   Healthy Mindset (Psychosocial):  Focused Goals, Sustainable Changes Clinical staff led group instruction and group discussion with PowerPoint presentation and patient guidebook. To enhance the learning environment the use of posters, models and videos may be added. Patients will be able to apply effective goal setting strategies to establish at least one personal goal, and then take consistent, meaningful action toward that goal. They will learn to identify common barriers to achieving personal goals and develop strategies to overcome them. Patients will also gain an understanding of how our mind-set can impact our ability to achieve goals and the importance of cultivating a positive and growth-oriented mind-set. The purpose of this lesson is to provide patients with a deeper understanding of how to set and achieve personal goals, as well as the tools and strategies needed to overcome common obstacles which may arise along the way.  From Head to Heart: The Power of a Healthy Outlook  Clinical staff led group instruction and group discussion with PowerPoint presentation and patient guidebook. To enhance the learning  environment the use of posters, models and videos may be added. Patients will be able to recognize and describe the impact of emotions and mood on physical health. They will discover the importance of self-care and explore self-care practices which may work for them. Patients will also learn how to utilize the 4 Cs to cultivate a healthier outlook and better manage stress and challenges. The purpose of this lesson is to demonstrate to patients how a healthy outlook is an essential part of maintaining good health, especially as they continue their cardiac rehab journey.  Healthy  Sleep for a Healthy Heart Clinical staff led group instruction and group discussion with PowerPoint presentation and patient guidebook. To enhance the learning environment the use of posters, models and videos may be added. At the conclusion of this workshop, patients will be able to demonstrate knowledge of the importance of sleep to overall health, well-being, and quality of life. They will understand the symptoms of, and treatments for, common sleep disorders. Patients will also be able to identify daytime and nighttime behaviors which impact sleep, and they will be able to apply these tools to help manage sleep-related challenges. The purpose of this lesson is to provide patients with a general overview of sleep and outline the importance of quality sleep. Patients will learn about a few of the most common sleep disorders. Patients will also be introduced to the concept of sleep hygiene, and discover ways to self-manage certain sleeping problems through simple daily behavior changes. Finally, the workshop will motivate patients by clarifying the links between quality sleep and their goals of heart-healthy living.   Recognizing and Reducing Stress Clinical staff led group instruction and group discussion with PowerPoint presentation and patient guidebook. To enhance the learning environment the use of posters, models and videos  may be added. At the conclusion of this workshop, patients will be able to understand the types of stress reactions, differentiate between acute and chronic stress, and recognize the impact that chronic stress has on their health. They will also be able to apply different coping mechanisms, such as reframing negative self-talk. Patients will have the opportunity to practice a variety of stress management techniques, such as deep abdominal breathing, progressive muscle relaxation, and/or guided imagery.  The purpose of this lesson is to educate patients on the role of stress in their lives and to provide healthy techniques for coping with it.  Learning Barriers/Preferences:  Learning Barriers/Preferences - 09/05/24 1153       Learning Barriers/Preferences   Learning Barriers None    Learning Preferences Skilled Demonstration;Pictoral;Written Material;Group Instruction;Audio;Computer/Internet;Verbal Instruction;Video;Individual Instruction          Education Topics:  Knowledge Questionnaire Score:  Knowledge Questionnaire Score - 09/05/24 1426       Knowledge Questionnaire Score   Pre Score 23/24          Core Components/Risk Factors/Patient Goals at Admission:  Personal Goals and Risk Factors at Admission - 09/05/24 1428       Core Components/Risk Factors/Patient Goals on Admission    Weight Management Yes;Obesity;Weight Loss    Intervention Weight Management: Develop a combined nutrition and exercise program designed to reach desired caloric intake, while maintaining appropriate intake of nutrient and fiber, sodium and fats, and appropriate energy expenditure required for the weight goal.;Weight Management: Provide education and appropriate resources to help participant work on and attain dietary goals.;Weight Management/Obesity: Establish reasonable short term and long term weight goals.;Obesity: Provide education and appropriate resources to help participant work on and attain  dietary goals.    Admit Weight 227 lb 11.8 oz (103.3 kg)    Expected Outcomes Short Term: Continue to assess and modify interventions until short term weight is achieved;Long Term: Adherence to nutrition and physical activity/exercise program aimed toward attainment of established weight goal;Weight Maintenance: Understanding of the daily nutrition guidelines, which includes 25-35% calories from fat, 7% or less cal from saturated fats, less than 200mg  cholesterol, less than 1.5gm of sodium, & 5 or more servings of fruits and vegetables daily;Weight Loss: Understanding of general recommendations for a balanced deficit meal  plan, which promotes 1-2 lb weight loss per week and includes a negative energy balance of [phone] kcal/d;Understanding recommendations for meals to include 15-35% energy as protein, 25-35% energy from fat, 35-60% energy from carbohydrates, less than 200mg  of dietary cholesterol, 20-35 gm of total fiber daily;Understanding of distribution of calorie intake throughout the day with the consumption of 4-5 meals/snacks    Diabetes Yes    Intervention Provide education about signs/symptoms and action to take for hypo/hyperglycemia.;Provide education about proper nutrition, including hydration, and aerobic/resistive exercise prescription along with prescribed medications to achieve blood glucose in normal ranges: Fasting glucose 65-99 mg/dL    Expected Outcomes Short Term: Participant verbalizes understanding of the signs/symptoms and immediate care of hyper/hypoglycemia, proper foot care and importance of medication, aerobic/resistive exercise and nutrition plan for blood glucose control.;Long Term: Attainment of HbA1C < 7%.    Hypertension Yes    Intervention Provide education on lifestyle modifcations including regular physical activity/exercise, weight management, moderate sodium restriction and increased consumption of fresh fruit, vegetables, and low fat dairy, alcohol moderation, and  smoking cessation.;Monitor prescription use compliance.    Expected Outcomes Short Term: Continued assessment and intervention until BP is < 140/37mm HG in hypertensive participants. < 130/91mm HG in hypertensive participants with diabetes, heart failure or chronic kidney disease.;Long Term: Maintenance of blood pressure at goal levels.    Lipids Yes    Intervention Provide education and support for participant on nutrition & aerobic/resistive exercise along with prescribed medications to achieve LDL 70mg , HDL >40mg .    Expected Outcomes Short Term: Participant states understanding of desired cholesterol values and is compliant with medications prescribed. Participant is following exercise prescription and nutrition guidelines.;Long Term: Cholesterol controlled with medications as prescribed, with individualized exercise RX and with personalized nutrition plan. Value goals: LDL < 70mg , HDL > 40 mg.    Personal Goal Other Yes    Personal Goal Continues to have poor appetite, balance issues, increase stamina (currently tired after ADL's like showering)    Intervention Will continue to monitor pt and progress worklaods as tolerated without sign or symptom    Expected Outcomes Pt will achieve his goals          Core Components/Risk Factors/Patient Goals Review:    Core Components/Risk Factors/Patient Goals at Discharge (Final Review):    ITP Comments:  ITP Comments     Row Name 09/05/24 0835           ITP Comments Wilbert Bihari, MD: Medical Director. Introduction to the [facility]/Intensive Cardiac Rehab. Inital orientation packet reviewed with the patient.          Comments: Participant attended orientation for the cardiac rehabilitation program on  09/05/2024  to perform initial intake and exercise walk test. Patient introduced to the [facility] education and orientation packet was reviewed. Completed 6-minute walk test, measurements, initial ITP, and exercise  prescription. Vital signs stable. Telemetry-normal sinus rhythm, asymptomatic. Service time was from 0814 to 1120.         [1]  Current Outpatient Medications:    acetaminophen  (TYLENOL ) 325 MG tablet, Take 2 tablets (650 mg total) by mouth every 6 (six) hours as needed for mild pain (pain score 1-3)., Disp: , Rfl:    aspirin  EC 325 MG tablet, Take 1 tablet (325 mg total) by mouth daily., Disp: , Rfl:    atorvastatin  (LIPITOR) 40 MG tablet, Take 40 mg by mouth daily., Disp: , Rfl:    cetirizine (ZYRTEC) 10 MG tablet, Take 10 mg  by mouth daily., Disp: , Rfl:    diphenhydrAMINE  (BENADRYL ) 25 mg capsule, Take 1 capsule (25 mg total) by mouth every 8 (eight) hours as needed for itching., Disp: , Rfl:    FARXIGA  10 MG TABS tablet, Take 10 mg by mouth daily. , Disp: , Rfl:    metFORMIN  (GLUCOPHAGE ) 500 MG tablet, Take 1,000 mg by mouth 2 (two) times daily with a meal., Disp: , Rfl:    metoprolol  tartrate (LOPRESSOR ) 25 MG tablet, Take 1.5 tablets (37.5 mg total) by mouth 2 (two) times daily., Disp: 270 tablet, Rfl: 3   mometasone (NASONEX) 50 MCG/ACT nasal spray, Place 2 sprays into the nose daily as needed (Rhinitis)., Disp: , Rfl:    Multiple Vitamin (MULTIVITAMIN) capsule, Take 1 capsule by mouth daily., Disp: , Rfl:    tirzepatide (MOUNJARO) 12.5 MG/0.5ML Pen, Inject 12.5 mg into the skin once a week., Disp: , Rfl:    tiZANidine  (ZANAFLEX ) 2 MG tablet, Take 1 tablet (2 mg total) by mouth at bedtime. (Patient taking differently: Take 4 mg by mouth at bedtime.), Disp: 90 tablet, Rfl: 1   traMADol  (ULTRAM ) 50 MG tablet, Take 1 tablet (50 mg total) by mouth every 6 (six) hours as needed for severe pain (pain score 7-10)., Disp: 28 tablet, Rfl: 0   furosemide  (LASIX ) 40 MG tablet, Take 1 tablet (40 mg total) by mouth daily. Take for 10 days then stop (Patient not taking: Reported on 09/05/2024), Disp: 10 tablet, Rfl: 0   gabapentin  (NEURONTIN ) 100 MG capsule, TAKE 2 CAPSULES(200 MG) BY MOUTH AT BEDTIME  (Patient taking differently: Take 400 mg by mouth at bedtime.), Disp: 180 capsule, Rfl: 1 [2]  Social History Tobacco Use  Smoking Status Former   Current packs/day: 0.00   Average packs/day: 0.5 packs/day for 12.0 years (6.0 ttl pk-yrs)   Types: Cigarettes   Start date: 12/15/1979   Quit date: 12/15/1991   Years since quitting: 32.7  Smokeless Tobacco Former   Types: Snuff  Tobacco Comments   quit snuff in ~ 1981

## 2024-09-09 ENCOUNTER — Ambulatory Visit: Attending: Cardiology | Admitting: Cardiology

## 2024-09-09 ENCOUNTER — Encounter: Payer: Self-pay | Admitting: *Deleted

## 2024-09-09 ENCOUNTER — Encounter: Payer: Self-pay | Admitting: Cardiology

## 2024-09-09 ENCOUNTER — Encounter (HOSPITAL_COMMUNITY)
Admission: RE | Admit: 2024-09-09 | Discharge: 2024-09-09 | Disposition: A | Source: Ambulatory Visit | Attending: Cardiology | Admitting: Cardiology

## 2024-09-09 ENCOUNTER — Other Ambulatory Visit: Payer: Self-pay | Admitting: *Deleted

## 2024-09-09 VITALS — BP 110/80 | HR 91 | Ht 68.0 in | Wt 224.0 lb

## 2024-09-09 DIAGNOSIS — E113393 Type 2 diabetes mellitus with moderate nonproliferative diabetic retinopathy without macular edema, bilateral: Secondary | ICD-10-CM | POA: Diagnosis not present

## 2024-09-09 DIAGNOSIS — I420 Dilated cardiomyopathy: Secondary | ICD-10-CM | POA: Diagnosis not present

## 2024-09-09 DIAGNOSIS — Z794 Long term (current) use of insulin: Secondary | ICD-10-CM

## 2024-09-09 DIAGNOSIS — Z952 Presence of prosthetic heart valve: Secondary | ICD-10-CM | POA: Diagnosis not present

## 2024-09-09 DIAGNOSIS — Z7982 Long term (current) use of aspirin: Secondary | ICD-10-CM | POA: Insufficient documentation

## 2024-09-09 DIAGNOSIS — I2510 Atherosclerotic heart disease of native coronary artery without angina pectoris: Secondary | ICD-10-CM

## 2024-09-09 DIAGNOSIS — Z951 Presence of aortocoronary bypass graft: Secondary | ICD-10-CM

## 2024-09-09 LAB — GLUCOSE, CAPILLARY: Glucose-Capillary: 134 mg/dL — ABNORMAL HIGH (ref 70–99)

## 2024-09-09 NOTE — Patient Instructions (Addendum)
 Medication Instructions:   TAKE: Losartan  25mg  1 tablet daily   Lab Work: 3rd Floor   [address]  Your physician recommends that you return for lab work in:   1 week You need to have labs done when you are fasting.  You can come Monday through Friday 8:00 am to 11:30AM and 1:00 to 4:00. You do not need to make an appointment as the order has already been placed.   Testing/Procedures: None Ordered   Follow-Up: At [facility], you and your health needs are our priority.  As part of our continuing mission to provide you with exceptional heart care, we have created designated Provider Care Teams.  These Care Teams include your primary Cardiologist (physician) and Advanced Practice Providers (APPs -  Physician Assistants and Nurse Practitioners) who all work together to provide you with the care you need, when you need it.  We recommend signing up for the patient portal called MyChart.  Sign up information is provided on this After Visit Summary.  MyChart is used to connect with patients for Virtual Visits (Telemedicine).  Patients are able to view lab/test results, encounter notes, upcoming appointments, etc.  Non-urgent messages can be sent to your provider as well.   To learn more about what you can do with MyChart, go to [email or web address].    Your next appointment:   3 month(s)  The format for your next appointment:   In Person  Provider:   Lamar Fitch, MD    Other Instructions NA

## 2024-09-09 NOTE — Progress Notes (Signed)
 Cardiology Office Note:    Date:  09/09/2024   ID:  Tim Cook, DOB 10-15-1960, MRN [id number]  PCP:  Cristopher Bottcher, NP  Cardiologist:  Lamar Fitch, MD    Referring MD: Cristopher Bottcher, NP   Chief Complaint  Patient presents with   Follow-up    History of Present Illness:    Tim Cook is a 63 y.o. male  past medical history significant for coronary disease in 2017 he required stent to the proximal portion of LAD and mid LAD for 90% stenosis, additional problem include essential hypertension diabetes dyslipidemia obstructive sleep apnea he also was found to have severe aortic stenosis and 3 weeks ago he underwent aortic valve replacement with 25 mm Inspira valve with 2 venous grafts to obtuse marginal 2 and obtuse marginal 3.  Comes today to my office for follow-up.  Today was first day of cardiac rehab he started exhausted but did quite well.  Denies have any chest pain tightness squeezing pressure burning chest back to himself.  Past Medical History:  Diagnosis Date   Acute lateral meniscal tear 06/09/2016   Arthritis    knees, lower back, hands (05/18/2016)   Atherosclerotic heart disease of native coronary artery without angina pectoris    05/18/16  Cath severe 90% LAD prox, 99% mid, Circ dominant with 30% OM1, 50% OM2, 30% OM3, 60% distal circ and PL, severe stenosis nondominant RCA.  EF 55%  3.0x 20, 2.5 x 20 mm, 2.25 x 16 mm Promus premier sent to LAD Dr. Verlin    Bariatric surgery status 05/18/2016   Benign essential hypertension 01/28/2021   Bursitis of right shoulder 01/21/2019   CAD (coronary artery disease), native coronary artery    05/18/16  Cath severe 90% LAD prox, 99% mid, Circ dominant with 30% OM1, 50% OM2, 30% OM3, 60% distal circ and PL, severe stenosis nondominant RCA.  EF 55%  3.0x 20, 2.5 x 20 mm, 2.25 x 16 mm Promus premier sent to LAD Dr. Verlin    Chronic bronchitis (HCC)    most years (05/18/2016)   Chronic lower back pain    Closed  fracture of right distal radius 03/12/2020   Closed fracture of right proximal humerus 03/12/2020   DDD (degenerative disc disease), lumbar    Degenerative arthritis of right knee 05/09/2017   Degenerative disc disease, lumbar 07/07/2016   Degenerative lumbar spinal stenosis 04/07/2021   Diabetes mellitus without complication (HCC)    Dislocation of left middle finger 01/21/2019   Encounter for immunization 01/28/2021   Erectile dysfunction 01/28/2021   Gastro-esophageal reflux 01/28/2021   History of nutritional deficiency 10/26/2021   Hyperglycemia due to type 2 diabetes mellitus (HCC) 01/28/2021   Hyperlipidemia    Hypertensive heart disease without CHF    Lumbago with sciatica, right side 04/07/2021   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 type 2 diabetes mellitus (HCC) 01/28/2021   Mixed hyperlipidemia 05/18/2016   Nonallopathic lesion of lumbosacral region 07/07/2016   Nonallopathic lesion of sacral region 07/07/2016   Nonallopathic lesion of thoracic region 07/07/2016   Obesity    Radiculopathy, lumbar region 04/07/2021   Shoulder joint pain 01/28/2021   Type II diabetes mellitus (HCC)    Unstable angina (HCC)    Vitamin D  deficiency 01/28/2021    Past Surgical History:  Procedure Laterality Date   AORTIC VALVE REPLACEMENT N/A 07/02/2024   Procedure: REPLACEMENT, AORTIC VALVE, OPEN USING INSPIRIS;  Surgeon: Shyrl Linnie KIDD, MD;  Location: [facility]  OR;  Service: Open Heart Surgery;  Laterality: N/A;   CARDIAC CATHETERIZATION N/A 05/18/2016   Procedure: Left Heart Cath and Coronary Angiography;  Surgeon: Lonni JONETTA Cash, MD;  [facility] INVASIVE CV LAB;  Service: Cardiovascular;  Laterality: N/A;   CARDIAC CATHETERIZATION N/A 05/18/2016   Procedure: Coronary Stent Intervention;  Surgeon: Lonni JONETTA Cash, MD;  Location: [facility] INVASIVE CV LAB;  Service: Cardiovascular;  Laterality: N/A;   CARPAL TUNNEL RELEASE Right  03/17/2020   Procedure: CARPAL TUNNEL RELEASE;  Surgeon: Josefina Chew, MD;  Location: [facility] ORS;  Service: Orthopedics;  Laterality: Right;   CORONARY ANGIOPLASTY     CORONARY ARTERY BYPASS GRAFT N/A 07/02/2024   Procedure: CORONARY ARTERY BYPASS GRAFTING (CABG) X2 USING ENDOSCOPICALLY HARVESTED RIGHT GREATER SAPHENOUS VEIN;  Surgeon: Shyrl Linnie KIDD, MD;  Location: [facility] OR;  Service: Open Heart Surgery;  Laterality: N/A;   FOOT FRACTURE SURGERY Right 1970s   INTRAOPERATIVE TRANSESOPHAGEAL ECHOCARDIOGRAM N/A 07/02/2024   Procedure: ECHOCARDIOGRAM, TRANSESOPHAGEAL, INTRAOPERATIVE;  Surgeon: Shyrl Linnie KIDD, MD;  Location: [facility] OR;  Service: Open Heart Surgery;  Laterality: N/A;   LAPAROSCOPIC GASTRIC BANDING  07/2007   by Dr. Gladis   ORIF HUMERUS FRACTURE Right 03/17/2020   Procedure: OPEN REDUCTION INTERNAL FIXATION (ORIF) PROXIMAL HUMERUS FRACTURE AND DISTAL RADIUS FRACTURE;  Surgeon: Josefina Chew, MD;  Location: [facility] ORS;  Service: Orthopedics;  Laterality: Right;   PILONIDAL CYST EXCISION  ~ 1985   took 8 out all at once   RIGHT HEART CATH N/A 07/01/2024   Procedure: RIGHT HEART CATH;  Surgeon: Zenaida Morene PARAS, MD;  Location[facility] INVASIVE CV LAB;  Service: Cardiovascular;  Laterality: N/A;   RIGHT/LEFT HEART CATH AND CORONARY ANGIOGRAPHY N/A 05/14/2024   Procedure: RIGHT/LEFT HEART CATH AND CORONARY ANGIOGRAPHY;  Surgeon: Wonda Sharper, MD;  Location: [facility] INVASIVE CV LAB;  Service: Cardiovascular;  Laterality: N/A;   SQUAMOUS CELL CARCINOMA EXCISION     right upper arm, left shoulder    Current Medications: Active Medications[1]   Allergies:   Patient has no known allergies.   Social History   Socioeconomic History   Marital status: Married    Spouse name: Leita   Number of children: Not on file   Years of education: Not on file   Highest education level: Some college, no degree  Occupational History   Not on file  Tobacco Use   Smoking status: Former    Current  packs/day: 0.00    Average packs/day: 0.5 packs/day for 12.0 years (6.0 ttl pk-yrs)    Types: Cigarettes    Start date: 12/15/1979    Quit date: 12/15/1991    Years since quitting: 32.7   Smokeless tobacco: Former    Types: Snuff   Tobacco comments:    quit snuff in ~ 1981  Vaping Use   Vaping status: Never Used  Substance and Sexual Activity   Alcohol use: Not Currently    Alcohol/week: 7.0 standard drinks of alcohol    Types: 7 Cans of beer per week    Comment: More social use   Drug use: No   Sexual activity: Yes  Other Topics Concern   Not on file  Social History Narrative   Not on file   Social Drivers of Health   Tobacco Use: Medium Risk (09/09/2024)   Patient History    Smoking Tobacco Use: Former    Smokeless Tobacco Use: Former    Passive Exposure: Not on Stage Manager: Not on file  Food Insecurity:  No Food Insecurity (07/01/2024)   Epic    Worried About Programme Researcher, Broadcasting/film/video in the Last Year: Never true    Ran Out of Food in the Last Year: Never true  Transportation Needs: No Transportation Needs (07/01/2024)   Epic    Lack of Transportation (Medical): No    Lack of Transportation (Non-Medical): No  Physical Activity: Not on file  Stress: Not on file  Social Connections: Not on file  Depression (PHQ2-9): Low Risk (09/05/2024)   Depression (PHQ2-9)    PHQ-2 Score: 3  Alcohol Screen: Not on file  Housing: Low Risk (07/01/2024)   Epic    Unable to Pay for Housing in the Last Year: No    Number of Times Moved in the Last Year: 0    Homeless in the Last Year: No  Utilities: Not At Risk (07/01/2024)   Epic    Threatened with loss of utilities: No  Health Literacy: Not on file     Family History: The patient's family history includes Diabetes Mellitus I in his sister; Heart attack in his father; Heart disease in his father, mother, and sister; Stroke in his brother and father. ROS:   Please see the history of present illness.    All 14  point review of systems negative except as described per history of present illness  EKGs/Labs/Other Studies Reviewed:         Recent Labs: 07/01/2024: ALT 26; B Natriuretic Peptide 19.4 07/03/2024: Magnesium  2.0 07/05/2024: Hemoglobin 10.4; Platelets 146 07/07/2024: BUN 15; Creatinine, Ser 1.31; Potassium 4.5; Sodium 136  Recent Lipid Panel    Component Value Date/Time   CHOL 103 07/02/2024 0437   CHOL 125 12/07/2018 1520   TRIG 78 07/02/2024 0437   HDL 43 07/02/2024 0437   HDL 57 12/07/2018 1520   CHOLHDL 2.4 07/02/2024 0437   VLDL 16 07/02/2024 0437   LDLCALC 44 07/02/2024 0437   LDLCALC 47 12/07/2018 1520    Physical Exam:    VS:  BP 110/80   Pulse 91   Ht 5' 8 (1.727 m)   Wt 224 lb (101.6 kg)   SpO2 99%   BMI 34.06 kg/m     Wt Readings from Last 3 Encounters:  09/09/24 224 lb (101.6 kg)  09/05/24 227 lb 11.8 oz (103.3 kg)  08/09/24 229 lb 4.8 oz (104 kg)     GEN:  Well nourished, well developed in no acute distress HEENT: Normal NECK: No JVD; No carotid bruits LYMPHATICS: No lymphadenopathy CARDIAC: RRR, no murmurs, no rubs, no gallops RESPIRATORY:  Clear to auscultation without rales, wheezing or rhonchi  ABDOMEN: Soft, non-tender, non-distended MUSCULOSKELETAL:  No edema; No deformity  SKIN: Warm and dry LOWER EXTREMITIES: no swelling NEUROLOGIC:  Alert and oriented x 3 PSYCHIATRIC:  Normal affect   ASSESSMENT:    1. S/P AVR (aortic valve replacement) 25 mm Inspiris   2. Coronary artery disease involving native coronary artery of native heart without angina pectoris   3. Dilated cardiomyopathy (HCC) ejection fraction 45%   4. 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 Cardiomyopathy which is either dilated or probably some component of ischemic cardiomyopathy ejection fraction 45% we will try to introduce him back to losartan   we will check kidney function within next week. Coronary disease stable denies have any chest pain tightness squeezing pressure burning chest, on antiplatelet therapy which I will continue, Dyslipidemia I did review K[facility] he is taking Lipitor 40 high intense statin LDL 44 HDL 43 will continue present management   Medication Adjustments/Labs and Tests Ordered: Current medicines are reviewed at length with the patient today.  Concerns regarding medicines are outlined above.  No orders of the defined types were placed in this encounter.  Medication changes: No orders of the defined types were placed in this encounter.   Signed, Lamar DOROTHA Fitch, MD, [facility] 09/09/2024 2:06 PM    [facility]    [1]  Current Meds  Medication Sig   acetaminophen  (TYLENOL ) 325 MG tablet Take 2 tablets (650 mg total) by mouth every 6 (six) hours as needed for mild pain (pain score 1-3).   aspirin  EC 325 MG tablet Take 1 tablet (325 mg total) by mouth daily.   atorvastatin  (LIPITOR) 40 MG tablet Take 40 mg by mouth daily.   cetirizine (ZYRTEC) 10 MG tablet Take 10 mg by mouth daily.   diphenhydrAMINE  (BENADRYL ) 25 mg capsule Take 1 capsule (25 mg total) by mouth every 8 (eight) hours as needed for itching.   FARXIGA  10 MG TABS tablet Take 10 mg by mouth daily.    gabapentin  (NEURONTIN ) 100 MG capsule TAKE 2 CAPSULES(200 MG) BY MOUTH AT BEDTIME (Patient taking differently: Take 400 mg by mouth at bedtime.)   metFORMIN  (GLUCOPHAGE ) 500 MG tablet Take 1,000 mg by mouth 2 (two) times daily with a meal.   metoprolol  tartrate (LOPRESSOR ) 25 MG tablet Take 1.5 tablets (37.5 mg total) by mouth 2 (two) times daily.   mometasone (NASONEX) 50 MCG/ACT nasal spray Place 2 sprays into the nose daily as needed  (Rhinitis).   Multiple Vitamin (MULTIVITAMIN) capsule Take 1 capsule by mouth daily.   tirzepatide (MOUNJARO) 12.5 MG/0.5ML Pen Inject 12.5 mg into the skin once a week.   tiZANidine  (ZANAFLEX ) 2 MG tablet Take 1 tablet (2 mg total) by mouth at bedtime. (Patient taking differently: Take 4 mg by mouth at bedtime.)   traMADol  (ULTRAM ) 50 MG tablet Take 1 tablet (50 mg total) by mouth every 6 (six) hours as needed for severe pain (pain score 7-10).

## 2024-09-09 NOTE — Progress Notes (Signed)
 Daily Session Note  Patient Details  Name: Tim Cook MRN: [id number] Date of Birth: 01/28/1961 Referring Provider:   Flowsheet Row INTENSIVE CARDIAC REHAB ORIENT from 09/05/2024 in [facility]  Referring Provider Dr. Lamar Fitch MD    Encounter Date: 09/09/2024  Check In:  Session Check In - 09/09/24 0708       Check-In   Supervising physician immediately available to respond to emergencies [facility] MD immediately available    Physician(s) Orren Fabry, PA-C    Location [facility]    Staff Present Alec Finder BS, ACSM-CEP, Exercise Physiologist;Dock Baccam Lennon, RN, BSN;Theadora Byes, RN, MHA;Olinty Valere, MS, ACSM-CEP, Exercise Physiologist    Virtual Visit No    Medication changes reported     No    Fall or balance concerns reported    No    Tobacco Cessation No Change    Warm-up and Cool-down Performed as group-led instruction    Resistance Training Performed Yes    VAD Patient? No    PAD/SET Patient? No      Pain Assessment   Currently in Pain? No/denies    Pain Score 0-No pain    Multiple Pain Sites No          Capillary Blood Glucose: Results for orders placed or performed during the hospital encounter of 09/09/24 (from the past 24 hours)  Glucose, capillary     Status: Abnormal   Collection Time: 09/09/24  7:08 AM  Result Value Ref Range   Glucose-Capillary 134 (H) 70 - 99 mg/dL     Exercise Prescription Changes - 09/09/24 0800       Response to Exercise   Blood Pressure (Admit) 114/64    Blood Pressure (Exercise) 110/60    Blood Pressure (Exit) 114/72    Heart Rate (Admit) 104 bpm    Heart Rate (Exercise) 125 bpm    Heart Rate (Exit) 113 bpm    Rating of Perceived Exertion (Exercise) 9    Perceived Dyspnea (Exercise) 0    Comments Pt first day in the [facility]CR program    Duration Progress to 30 minutes of  aerobic without signs/symptoms of physical distress     Intensity THRR New      Progression   Progression Continue to progress workloads to maintain intensity without signs/symptoms of physical distress.    Average METs 2      Resistance Training   Weight 3    Reps 10-15    Time 10 Minutes      NuStep   Level 2    SPM 83    Minutes 30    METs 2          Tobacco Use History[1]  Goals Met:  Exercise tolerated well No report of concerns or symptoms today Strength training completed today  Goals Unmet:  Not Applicable  Comments: Pt started cardiac rehab today.  Pt tolerated light exercise without difficulty. VSS, telemetry-NSR with ST depression, asymptomatic.  Medication list reconciled. Pt denies barriers to medication compliance.  PSYCHOSOCIAL ASSESSMENT:  PHQ-3. Pt exhibits positive coping skills, hopeful outlook with supportive family. No psychosocial needs identified at this time, no psychosocial interventions necessary.    Pt enjoys traveling and candlemaking.   Pt oriented to exercise equipment and routine.    Understanding verbalized.     Dr. Wilbert Bihari is Medical Director for Cardiac Rehab at [facility].    [1]  Social History  Tobacco Use  Smoking Status Former   Current packs/day: 0.00   Average packs/day: 0.5 packs/day for 12.0 years (6.0 ttl pk-yrs)   Types: Cigarettes   Start date: 12/15/1979   Quit date: 12/15/1991   Years since quitting: 32.7  Smokeless Tobacco Former   Types: Snuff  Tobacco Comments   quit snuff in ~ 1981

## 2024-09-10 ENCOUNTER — Encounter: Payer: Self-pay | Admitting: Cardiology

## 2024-09-10 MED ORDER — LOSARTAN POTASSIUM 25 MG PO TABS: 25.0000 mg | ORAL_TABLET | Freq: Every day | ORAL | 3 refills | Status: DC

## 2024-09-11 ENCOUNTER — Encounter (HOSPITAL_COMMUNITY): Admission: RE | Admit: 2024-09-11 | Discharge: 2024-09-11 | Attending: Cardiology | Admitting: Cardiology

## 2024-09-11 ENCOUNTER — Encounter (HOSPITAL_COMMUNITY)

## 2024-09-11 DIAGNOSIS — Z952 Presence of prosthetic heart valve: Secondary | ICD-10-CM | POA: Diagnosis not present

## 2024-09-11 DIAGNOSIS — Z951 Presence of aortocoronary bypass graft: Secondary | ICD-10-CM

## 2024-09-11 LAB — GLUCOSE, CAPILLARY
Glucose-Capillary: 131 mg/dL — ABNORMAL HIGH (ref 70–99)
Glucose-Capillary: 73 mg/dL (ref 70–99)
Glucose-Capillary: 78 mg/dL (ref 70–99)
Glucose-Capillary: 94 mg/dL (ref 70–99)

## 2024-09-11 NOTE — Progress Notes (Signed)
 CBG 131 pre exercise. Patient ate raisin bran for breakfast. CBG post exercise 73. Patient was given orange juice and graham crackers. Recheck CBG 78. Patient was given ginger ale. Patient's primary care provider was notified about today's CBG's.Lyle Bertrand NP  Patient is taking mounjaro, farxiga  and metfromin, Exit CBG 94. Patient was asymptomatic.Hadassah Elpidio Quan RN BSN

## 2024-09-12 ENCOUNTER — Telehealth (HOSPITAL_COMMUNITY): Payer: Self-pay | Admitting: *Deleted

## 2024-09-12 ENCOUNTER — Other Ambulatory Visit: Payer: Self-pay

## 2024-09-12 MED ORDER — LOSARTAN POTASSIUM 25 MG PO TABS: 25.0000 mg | ORAL_TABLET | Freq: Every day | ORAL | Status: AC

## 2024-09-12 NOTE — Telephone Encounter (Signed)
 Cardiac rehab called - Losartan  is to be held if systolic is less than 95 per Dr. Krasowski

## 2024-09-12 NOTE — Telephone Encounter (Signed)
-----   Message from Lamar Fitch, MD sent at 09/12/2024  9:58 AM EST ----- Please ask him not to take losartan  if blood pressure in the morning is less than 95 systolic ----- Message ----- From: Cyrus Hadassah ORN, RN Sent: 09/12/2024   8:22 AM EST To: Lamar JINNY Fitch, MD  Good morning Dr Fitch, I want to bring it to you attention that Mr Newmann's systolic blood pressures were 01-885. Diastolic BP 60-72 at cardiac rehab yesterday. Mr Jachim had not started the Losartan  that you have prescribed he had not picked up the medication when he came to exercise. I wanted to know if you want to recommend any parameters for the patient prior to him taking the medication. Please review attached media.  I appreciate your input! Sincerely, Hadassah Cyrus RN Cardiac Rehab

## 2024-09-12 NOTE — Telephone Encounter (Signed)
 Patient notified to take blood pressure prior to taking Losartan  and not to take it if his systolic blood pressure is less than 95. Will update medication list per Dr Bernie. Hubert will return to exercise at cardiac rehab tomorrow.Hadassah Elpidio Quan RN BSN

## 2024-09-13 ENCOUNTER — Encounter (HOSPITAL_COMMUNITY)

## 2024-09-13 ENCOUNTER — Encounter (HOSPITAL_COMMUNITY): Admission: RE | Admit: 2024-09-13 | Discharge: 2024-09-13 | Attending: Cardiology | Admitting: Cardiology

## 2024-09-13 DIAGNOSIS — Z952 Presence of prosthetic heart valve: Secondary | ICD-10-CM | POA: Diagnosis not present

## 2024-09-13 DIAGNOSIS — Z951 Presence of aortocoronary bypass graft: Secondary | ICD-10-CM

## 2024-09-13 LAB — GLUCOSE, CAPILLARY
Glucose-Capillary: 128 mg/dL — ABNORMAL HIGH (ref 70–99)
Glucose-Capillary: 87 mg/dL (ref 70–99)

## 2024-09-16 ENCOUNTER — Encounter (HOSPITAL_COMMUNITY): Admission: RE | Admit: 2024-09-16 | Discharge: 2024-09-16 | Attending: Cardiology | Admitting: Cardiology

## 2024-09-16 ENCOUNTER — Encounter (HOSPITAL_COMMUNITY)

## 2024-09-16 DIAGNOSIS — Z951 Presence of aortocoronary bypass graft: Secondary | ICD-10-CM

## 2024-09-16 DIAGNOSIS — Z952 Presence of prosthetic heart valve: Secondary | ICD-10-CM | POA: Diagnosis not present

## 2024-09-16 LAB — GLUCOSE, CAPILLARY: Glucose-Capillary: 65 mg/dL — ABNORMAL LOW (ref 70–99)

## 2024-09-18 ENCOUNTER — Encounter (HOSPITAL_COMMUNITY)
Admission: RE | Admit: 2024-09-18 | Discharge: 2024-09-18 | Disposition: A | Source: Ambulatory Visit | Attending: Cardiology | Admitting: Cardiology

## 2024-09-18 ENCOUNTER — Encounter (HOSPITAL_COMMUNITY)

## 2024-09-18 DIAGNOSIS — Z952 Presence of prosthetic heart valve: Secondary | ICD-10-CM | POA: Diagnosis not present

## 2024-09-18 DIAGNOSIS — Z951 Presence of aortocoronary bypass graft: Secondary | ICD-10-CM

## 2024-09-18 LAB — GLUCOSE, CAPILLARY: Glucose-Capillary: 85 mg/dL (ref 70–99)

## 2024-09-20 ENCOUNTER — Encounter (HOSPITAL_COMMUNITY)

## 2024-09-23 ENCOUNTER — Encounter (HOSPITAL_COMMUNITY)
Admission: RE | Admit: 2024-09-23 | Discharge: 2024-09-23 | Disposition: A | Source: Ambulatory Visit | Attending: Cardiology | Admitting: Cardiology

## 2024-09-23 ENCOUNTER — Encounter (HOSPITAL_COMMUNITY)

## 2024-09-23 DIAGNOSIS — Z952 Presence of prosthetic heart valve: Secondary | ICD-10-CM | POA: Diagnosis not present

## 2024-09-23 DIAGNOSIS — Z951 Presence of aortocoronary bypass graft: Secondary | ICD-10-CM

## 2024-09-24 NOTE — Progress Notes (Signed)
 Cardiac Individual Treatment Plan  Patient Details  Name: Tim Cook MRN: [id number] Date of Birth: [birth year] Referring Provider:   Flowsheet Row INTENSIVE CARDIAC REHAB ORIENT from 09/05/2024 in [facility]  Referring Provider Dr. Lamar Fitch MD    Initial Encounter Date:  Flowsheet Row INTENSIVE CARDIAC REHAB ORIENT from 09/05/2024 in [facility]  Date 09/05/24    Visit Diagnosis: 07/02/24 S/P aortic valve replacement  07/02/24 S/P CABG x 2  Patient's Home Medications on Admission: Current Medications[1]  Past Medical History: Past Medical History:  Diagnosis Date   Acute lateral meniscal tear 06/09/2016   Arthritis    knees, lower back, hands (05/18/2016)   Atherosclerotic heart disease of native coronary artery without angina pectoris    05/18/16  Cath severe 90% LAD prox, 99% mid, Circ dominant with 30% OM1, 50% OM2, 30% OM3, 60% distal circ and PL, severe stenosis nondominant RCA.  EF 55%  3.0x 20, 2.5 x 20 mm, 2.25 x 16 mm Promus premier sent to LAD Dr. Verlin    Bariatric surgery status 05/18/2016   Benign essential hypertension 01/28/2021   Bursitis of right shoulder 01/21/2019   CAD (coronary artery disease), native coronary artery    05/18/16  Cath severe 90% LAD prox, 99% mid, Circ dominant with 30% OM1, 50% OM2, 30% OM3, 60% distal circ and PL, severe stenosis nondominant RCA.  EF 55%  3.0x 20, 2.5 x 20 mm, 2.25 x 16 mm Promus premier sent to LAD Dr. Verlin    Chronic bronchitis (HCC)    most years (05/18/2016)   Chronic lower back pain    Closed fracture of right distal radius 03/12/2020   Closed fracture of right proximal humerus 03/12/2020   DDD (degenerative disc disease), lumbar    Degenerative arthritis of right knee 05/09/2017   Degenerative disc disease, lumbar 07/07/2016   Degenerative lumbar spinal stenosis 04/07/2021    Diabetes mellitus without complication (HCC)    Dislocation of left middle finger 01/21/2019   Encounter for immunization 01/28/2021   Erectile dysfunction 01/28/2021   Gastro-esophageal reflux 01/28/2021   History of nutritional deficiency 10/26/2021   Hyperglycemia due to type 2 diabetes mellitus (HCC) 01/28/2021   Hyperlipidemia    Hypertensive heart disease without CHF    Lumbago with sciatica, right side 04/07/2021   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 type 2 diabetes mellitus (HCC) 01/28/2021   Mixed hyperlipidemia 05/18/2016   Nonallopathic lesion of lumbosacral region 07/07/2016   Nonallopathic lesion of sacral region 07/07/2016   Nonallopathic lesion of thoracic region 07/07/2016   Obesity    Radiculopathy, lumbar region 04/07/2021   Shoulder joint pain 01/28/2021   Type II diabetes mellitus (HCC)    Unstable angina (HCC)    Vitamin D  deficiency 01/28/2021    Tobacco Use: Tobacco Use History[2]  Labs: Review Flowsheet  More data exists      Latest Ref Rng & Units 03/16/2020 05/14/2024 07/01/2024 07/02/2024 07/03/2024  Labs for ITP Cardiac and Pulmonary Rehab  Cholestrol 0 - 200 mg/dL - - - 896  -  LDL (calc) 0 - 99 mg/dL - - - 44  -  HDL-C >59 mg/dL - - - 43  -  Trlycerides <150 mg/dL - - - 78  -  Hemoglobin A1c 4.8 - 5.6 % 6.9  - 5.4  - -  PH, Arterial 7.35 - 7.45 - 7.352  -  7.377  7.394  7.410  7.438  7.437  7.371  7.367   PCO2 arterial 32 - 48 mmHg - 42.7  - 46.3  32.0  39.0  38.6  37.4  44.3  38.8   Bicarbonate 20.0 - 28.0 mmol/L - 23.7  24.3  26.9  25.9  27.2  19.7  24.7  26.1  25.3  27.5  25.7  22.2   TCO2 22 - 32 mmol/L - 25  26  28  27  29  21  23  26  27  27  26  26  29  27  25  26  23    Acid-base deficit 0.0 - 2.0 mmol/L - 2.0  2.0  - 5.0  3.0   O2 Saturation % - 99  77  74  73  98  99  97  100  100  80  100  97     Details       Multiple values from one day are sorted in reverse-chronological order          Capillary Blood Glucose: Lab Results  Component Value Date   GLUCAP 85 09/18/2024   GLUCAP 87 09/13/2024   GLUCAP 65 (L) 09/13/2024   GLUCAP 128 (H) 09/13/2024   GLUCAP 94 09/11/2024     Exercise Target Goals: Exercise Program Goal: Individual exercise prescription set using results from initial 6 min walk test and THRR while considering  patients activity barriers and safety.   Exercise Prescription Goal: Initial exercise prescription builds to 30-45 minutes a day of aerobic activity, 2-3 days per week.  Home exercise guidelines will be given to patient during program as part of exercise prescription that the participant will acknowledge.  Activity Barriers & Risk Stratification:  Activity Barriers & Cardiac Risk Stratification - 09/05/24 1417       Activity Barriers & Cardiac Risk Stratification   Activity Barriers Balance Concerns;Joint Problems;Arthritis;Back Problems;Deconditioning;Neck/Spine Problems    Cardiac Risk Stratification High   Under 5 MET's         6 Minute Walk:  6 Minute Walk     Row Name 09/05/24 1409         6 Minute Walk   Phase Initial     Distance 1200 feet     Walk Time 6 minutes     # of Rest Breaks 0     MPH 2.27     METS 2.79     RPE 9     Perceived Dyspnea  0     VO2 Peak 9.76     Symptoms Yes (comment)     Comments LBP 4/10 lingers with rest.     Resting HR 97 bpm     Resting BP 110/76     Resting Oxygen Saturation  97 %     Exercise Oxygen Saturation  during 6 min walk 100 %     Max Ex. HR 111 bpm     Max Ex. BP 122/68     2 Minute Post BP 114/64        Oxygen Initial Assessment:   Oxygen Re-Evaluation:   Oxygen Discharge (Final Oxygen Re-Evaluation):   Initial Exercise Prescription:  Initial Exercise Prescription - 09/05/24 1400       Date of Initial Exercise RX and Referring Provider   Date 09/05/24    Referring Provider Dr. Lamar Fitch MD    Expected Discharge Date 11/28/23      NuStep   Level  1  SPM 75    Minutes 15    METs 1.8      Recumbant Elliptical   Level 1    RPM 55    Watts 70    Minutes 15    METs 2.5      Prescription Details   Frequency (times per week) 3    Duration Progress to 30 minutes of continuous aerobic without signs/symptoms of physical distress      Intensity   THRR 40-80% of Max Heartrate 63-126    Ratings of Perceived Exertion 11-13    Perceived Dyspnea 0-4      Progression   Progression Continue progressive overload as per policy without signs/symptoms or physical distress.      Resistance Training   Training Prescription Yes    Weight 3    Reps 10-15          Perform Capillary Blood Glucose checks as needed.  Exercise Prescription Changes:   Exercise Prescription Changes     Row Name 09/09/24 0800 09/23/24 1000           Response to Exercise   Blood Pressure (Admit) 114/64 102/64      Blood Pressure (Exercise) 110/60 144/84      Blood Pressure (Exit) 114/72 100/60      Heart Rate (Admit) 104 bpm 94 bpm      Heart Rate (Exercise) 125 bpm 123 bpm      Heart Rate (Exit) 113 bpm 87 bpm      Rating of Perceived Exertion (Exercise) 9 8      Perceived Dyspnea (Exercise) 0 --      Comments Pt first day in the [facility] program --      Duration Progress to 30 minutes of  aerobic without signs/symptoms of physical distress Progress to 30 minutes of  aerobic without signs/symptoms of physical distress      Intensity THRR New THRR New        Progression   Progression Continue to progress workloads to maintain intensity without signs/symptoms of physical distress. Continue to progress workloads to maintain intensity without signs/symptoms of physical distress.      Average METs 2 3.1        Resistance Training   Training Prescription -- No      Weight 3 --      Reps 10-15 --      Time 10 Minutes --        Interval Training   Interval Training -- No        NuStep   Level 2 2      SPM 83 117      Minutes 30 30       METs 2 3.1         Exercise Comments:   Exercise Comments     Row Name 09/09/24 [phone]           Exercise Comments Pt first day in the [facility] program. Pt tolerated exercise well with an average MET level of 2.0. He is off to a good start and is learning his THRR, RPE and ExRx          Exercise Goals and Review:   Exercise Goals     Row Name 09/05/24 1424             Exercise Goals   Increase Physical Activity Yes       Intervention Provide advice, education, support and counseling about physical activity/exercise needs.;Develop an individualized  exercise prescription for aerobic and resistive training based on initial evaluation findings, risk stratification, comorbidities and participant's personal goals.       Expected Outcomes Short Term: Attend rehab on a regular basis to increase amount of physical activity.;Long Term: Exercising regularly at least 3-5 days a week.;Long Term: Add in home exercise to make exercise part of routine and to increase amount of physical activity.       Increase Strength and Stamina Yes       Intervention Provide advice, education, support and counseling about physical activity/exercise needs.;Develop an individualized exercise prescription for aerobic and resistive training based on initial evaluation findings, risk stratification, comorbidities and participant's personal goals.       Expected Outcomes Short Term: Perform resistance training exercises routinely during rehab and add in resistance training at home;Short Term: Increase workloads from initial exercise prescription for resistance, speed, and METs.;Long Term: Improve cardiorespiratory fitness, muscular endurance and strength as measured by increased METs and functional capacity ( )       Able to understand and use rate of perceived exertion (RPE) scale Yes       Intervention Provide education and explanation on how to use RPE scale       Expected Outcomes Short Term: Able to use  RPE daily in rehab to express subjective intensity level;Long Term:  Able to use RPE to guide intensity level when exercising independently       Knowledge and understanding of Target Heart Rate Range (THRR) Yes       Intervention Provide education and explanation of THRR including how the numbers were predicted and where they are located for reference       Expected Outcomes Short Term: Able to state/look up THRR;Long Term: Able to use THRR to govern intensity when exercising independently;Short Term: Able to use daily as guideline for intensity in rehab       Understanding of Exercise Prescription Yes       Intervention Provide education, explanation, and written materials on patient's individual exercise prescription       Expected Outcomes Short Term: Able to explain program exercise prescription;Long Term: Able to explain home exercise prescription to exercise independently          Exercise Goals Re-Evaluation :  Exercise Goals Re-Evaluation     Row Name 09/09/24 0831             Exercise Goal Re-Evaluation   Comments Pt first day in the [facility] program. Pt tolerated exercise well with an average MET level of 2.0. He is off to a good start and is learning his THRR, RPE and ExRx       Expected Outcomes Will continue to monitor pt and progress workloads as tolerated without sign or symptom          Discharge Exercise Prescription (Final Exercise Prescription Changes):  Exercise Prescription Changes - 09/23/24 1000       Response to Exercise   Blood Pressure (Admit) 102/64    Blood Pressure (Exercise) 144/84    Blood Pressure (Exit) 100/60    Heart Rate (Admit) 94 bpm    Heart Rate (Exercise) 123 bpm    Heart Rate (Exit) 87 bpm    Rating of Perceived Exertion (Exercise) 8    Duration Progress to 30 minutes of  aerobic without signs/symptoms of physical distress    Intensity THRR New      Progression   Progression Continue to progress workloads to maintain intensity  without signs/symptoms  of physical distress.    Average METs 3.1      Resistance Training   Training Prescription No      Interval Training   Interval Training No      NuStep   Level 2    SPM 117    Minutes 30    METs 3.1          Nutrition:  Target Goals: Understanding of nutrition guidelines, daily intake of sodium 1500mg , cholesterol 200mg , calories 30% from fat and 7% or less from saturated fats, daily to have 5 or more servings of fruits and vegetables.  Biometrics:   Post Biometrics - 09/05/24 1425        Post  Biometrics   Height 5' 8 (1.727 m)    Weight 103.3 kg    Waist Circumference 46 inches    Hip Circumference 43 inches    Waist to Hip Ratio 1.07 %    BMI (Calculated) 34.64    Triceps Skinfold 15 mm    % Body Fat 32.8 %    Grip Strength 21 kg    Flexibility 10 in    Single Leg Stand 4 seconds          Nutrition Therapy Plan and Nutrition Goals:  Nutrition Therapy & Goals - 09/11/24 1615       Nutrition Therapy   Diet Heart Healthy    Drug/Food Interactions Statins/Certain Fruits      Personal Nutrition Goals   Nutrition Goal Patient to identify strategies for reducing cardiovascular risk by attending the [facility] education and nutrition series weekly.    Personal Goal #2 Patient to improve diet quality by using the plate method as a guide for meal planning to include lean protein/plant protein, fruits, vegetables, whole grains, nonfat dairy as part of a well-balanced diet.    Comments Patient with past medical history significant for coronary disease in 2017 he required stent to the proximal portion of LAD and mid LAD for 90% stenosis, additional problems include HTN, diabetes, dyslipidemia, obstructive sleep apnea. Recently underwent aortic valve replacement, CABG X 2 on 07/02/2024. Pt reports decreased appetite since surgery. Non-significant wt loss of 4% noted over past 2 months. Pt currently receiving Mounjaro injections which likely  contributed to decreased hunger. Pt reports difficulty following plant-based due to food preferences. Avoids fried foods, though diet includes high sodium foods such as ham. RD provided meal suggestions based on foods pt currently enjoys. Reviewed main points of diet using plate model. Patient will benefit from participation in intensive cardiac rehab for nutrition education, exercise, and lifestyle modification.      Intervention Plan   Intervention Prescribe, educate and counsel regarding individualized specific dietary modifications aiming towards targeted core components such as weight, hypertension, lipid management, diabetes, heart failure and other comorbidities.    Expected Outcomes Short Term Goal: Understand basic principles of dietary content, such as calories, fat, sodium, cholesterol and nutrients.;Long Term Goal: Adherence to prescribed nutrition plan.          Nutrition Assessments:  MEDIFICTS Score Key: >=70 Need to make dietary changes  40-70 Heart Healthy Diet <= 40 Therapeutic Level Cholesterol Diet   Flowsheet Row INTENSIVE CARDIAC REHAB from 09/11/2024 in [facility]  Picture Your Plate Total Score on Admission 55   Picture Your Plate Scores: <59 Unhealthy dietary pattern with much room for improvement. 41-50 Dietary pattern unlikely to meet recommendations for good health and room for improvement.  51-60 More healthful dietary pattern, with some room for improvement.  >60 Healthy dietary pattern, although there may be some specific behaviors that could be improved.    Nutrition Goals Re-Evaluation:  Nutrition Goals Re-Evaluation     Row Name 09/18/24 1056             Goals   Current Weight 227 lb 4.7 oz (103.1 kg)  09/16/2024       Nutrition Goal Patient to identify strategies for reducing cardiovascular risk by attending the [facility] education and nutrition series weekly.       Expected Outcome  Goals in action. Patient with past medical history significant for coronary disease in 2017 he required stent to the proximal portion of LAD and mid LAD for 90% stenosis, additional problems include HTN, diabetes, dyslipidemia, obstructive sleep apnea. Recently underwent aortic valve replacement, CABG X 2 on 07/02/2024. Pt currently taking mounjaro, farxiga , and metformin ; metformin  dosage recently reduced after pt experienced significant drop in blood glucose after exercise last week. Pt reports eating cinnamon and raisin bagel with cream cheese ~ 2 hours prior to class as well as protein bar on way to class this morning. States blood glucose dropped to 50's during the night; ate barbecue ribs for dinner last night. RD discussed importance of including carbohydrate with protein source ~ 30 minutes prior to exercise to help ensure glucose remains in safe range. Also discussed adding snack such as yogurt with fruit in evening after dinner to help with glycemic management during the night. [facility] Managing Blood Sugar During Exercise handout provided. RD also reviewed information with spouse. Patient will benefit from participation in intensive cardiac rehab for nutrition education, exercise, and lifestyle modification.         Personal Goal #2 Re-Evaluation   Personal Goal #2 Patient to improve diet quality by using the plate method as a guide for meal planning to include lean protein/plant protein, fruits, vegetables, whole grains, nonfat dairy as part of a well-balanced diet.          Nutrition Goals Re-Evaluation:  Nutrition Goals Re-Evaluation     Row Name 09/18/24 1056             Goals   Current Weight 227 lb 4.7 oz (103.1 kg)  09/16/2024       Nutrition Goal Patient to identify strategies for reducing cardiovascular risk by attending the [facility] education and nutrition series weekly.       Expected Outcome Goals in action. Patient with past medical history significant for coronary disease  in 2017 he required stent to the proximal portion of LAD and mid LAD for 90% stenosis, additional problems include HTN, diabetes, dyslipidemia, obstructive sleep apnea. Recently underwent aortic valve replacement, CABG X 2 on 07/02/2024. Pt currently taking mounjaro, farxiga , and metformin ; metformin  dosage recently reduced after pt experienced significant drop in blood glucose after exercise last week. Pt reports eating cinnamon and raisin bagel with cream cheese ~ 2 hours prior to class as well as protein bar on way to class this morning. States blood glucose dropped to 50's during the night; ate barbecue ribs for dinner last night. RD discussed importance of including carbohydrate with protein source ~ 30 minutes prior to exercise to help ensure glucose remains in safe range. Also discussed adding snack such as yogurt with fruit in evening after dinner to help with glycemic management during the night. [facility] Managing Blood Sugar During Exercise handout provided. RD also reviewed information with spouse. Patient will benefit from  participation in intensive cardiac rehab for nutrition education, exercise, and lifestyle modification.         Personal Goal #2 Re-Evaluation   Personal Goal #2 Patient to improve diet quality by using the plate method as a guide for meal planning to include lean protein/plant protein, fruits, vegetables, whole grains, nonfat dairy as part of a well-balanced diet.          Nutrition Goals Discharge (Final Nutrition Goals Re-Evaluation):  Nutrition Goals Re-Evaluation - 09/18/24 1056       Goals   Current Weight 227 lb 4.7 oz (103.1 kg)   09/16/2024   Nutrition Goal Patient to identify strategies for reducing cardiovascular risk by attending the [facility] education and nutrition series weekly.    Expected Outcome Goals in action. Patient with past medical history significant for coronary disease in 2017 he required stent to the proximal portion of LAD and mid LAD for 90%  stenosis, additional problems include HTN, diabetes, dyslipidemia, obstructive sleep apnea. Recently underwent aortic valve replacement, CABG X 2 on 07/02/2024. Pt currently taking mounjaro, farxiga , and metformin ; metformin  dosage recently reduced after pt experienced significant drop in blood glucose after exercise last week. Pt reports eating cinnamon and raisin bagel with cream cheese ~ 2 hours prior to class as well as protein bar on way to class this morning. States blood glucose dropped to 50's during the night; ate barbecue ribs for dinner last night. RD discussed importance of including carbohydrate with protein source ~ 30 minutes prior to exercise to help ensure glucose remains in safe range. Also discussed adding snack such as yogurt with fruit in evening after dinner to help with glycemic management during the night. [facility] Managing Blood Sugar During Exercise handout provided. RD also reviewed information with spouse. Patient will benefit from participation in intensive cardiac rehab for nutrition education, exercise, and lifestyle modification.      Personal Goal #2 Re-Evaluation   Personal Goal #2 Patient to improve diet quality by using the plate method as a guide for meal planning to include lean protein/plant protein, fruits, vegetables, whole grains, nonfat dairy as part of a well-balanced diet.          Psychosocial: Target Goals: Acknowledge presence or absence of significant depression and/or stress, maximize coping skills, provide positive support system. Participant is able to verbalize types and ability to use techniques and skills needed for reducing stress and depression.  Initial Review & Psychosocial Screening:  Initial Psych Review & Screening - 09/05/24 0836       Initial Review   Current issues with Current Sleep Concerns;Current Stress Concerns    Source of Stress Concerns Chronic Illness;Financial;Retirement/disability      Family Dynamics   Good Support  System? Yes   Pt has his wife, kids, and neighbors as a part of his support system     Barriers   Psychosocial barriers to participate in program There are no identifiable barriers or psychosocial needs.;The patient should benefit from training in stress management and relaxation.      Screening Interventions   Interventions Encouraged to exercise;Provide feedback about the scores to participant    Expected Outcomes Long Term Goal: Stressors or current issues are controlled or eliminated.;Short Term goal: Identification and review with participant of any Quality of Life or Depression concerns found by scoring the questionnaire.;Long Term goal: The participant improves quality of Life and PHQ9 Scores as seen by post scores and/or verbalization of changes  Quality of Life Scores:  Quality of Life - 09/05/24 1426       Quality of Life   Select Quality of Life      Quality of Life Scores   Health/Function Pre 23.2 %    Socioeconomic Pre 28.29 %    Psych/Spiritual Pre 25.71 %    Family Pre 26.4 %    GLOBAL Pre 25.24 %         Scores of 19 and below usually indicate a poorer quality of life in these areas.  A difference of  2-3 points is a clinically meaningful difference.  A difference of 2-3 points in the total score of the Quality of Life Index has been associated with significant improvement in overall quality of life, self-image, physical symptoms, and general health in studies assessing change in quality of life.  PHQ-9: Review Flowsheet       09/05/2024 05/26/2017 04/12/2017  Depression screen PHQ 2/9  Decreased Interest 0 0 0  Down, Depressed, Hopeless 0 0 0  PHQ - 2 Score 0 0 0  Altered sleeping 1 - -  Tired, decreased energy 1 - -  Change in appetite 1 - -  Feeling bad or failure about yourself  0 - -  Trouble concentrating 0 - -  Moving slowly or fidgety/restless 0 - -  Suicidal thoughts 0 - -  PHQ-9 Score 3 - -  Difficult doing work/chores Not difficult at  all - -   Interpretation of Total Score  Total Score Depression Severity:  1-4 = Minimal depression, 5-9 = Mild depression, 10-14 = Moderate depression, 15-19 = Moderately severe depression, 20-27 = Severe depression   Psychosocial Evaluation and Intervention:   Psychosocial Re-Evaluation:  Psychosocial Re-Evaluation     Row Name 09/12/24 9076 09/23/24 1109           Psychosocial Re-Evaluation   Current issues with Current Sleep Concerns;Current Stress Concerns Current Sleep Concerns;Current Stress Concerns      Comments Tiara started cardiac rehab on 09/09/24. Marlan did voice concerns regarding his blood pressures as his cardiologist wants to put him on another medication. Will review PHQ9 in the upcoming future PHQ9 reviewed. Dahir reports having back pain which has improved. Jenney says his energy level has improved since he has been participating in cardiac rehab. Maddock says that he sometimes does not feel like eating as he is on monjaro.      Expected Outcomes [place] will have controlled or decreased stress upon completion of cardiac rehab Shrihan will have controlled or decreased stress upon completion of cardiac rehab      Interventions Relaxation education;Encouraged to attend Cardiac Rehabilitation for the exercise;Stress management education Relaxation education;Encouraged to attend Cardiac Rehabilitation for the exercise;Stress management education        Initial Review   Source of Stress Concerns Retirement/disability;Financial;Chronic Illness Retirement/disability;Financial;Chronic Illness      Comments Will continue to monitor and offer support as needed Will continue to monitor and offer support as needed         Psychosocial Discharge (Final Psychosocial Re-Evaluation):  Psychosocial Re-Evaluation - 09/23/24 1109       Psychosocial Re-Evaluation   Current issues with Current Sleep Concerns;Current Stress Concerns    Comments PHQ9 reviewed. Romuald reports having  back pain which has improved. Jenney says his energy level has improved since he has been participating in cardiac rehab. [place] says that he sometimes does not feel like eating as he is on monjaro.    Expected  Outcomes Halen will have controlled or decreased stress upon completion of cardiac rehab    Interventions Relaxation education;Encouraged to attend Cardiac Rehabilitation for the exercise;Stress management education      Initial Review   Source of Stress Concerns Retirement/disability;Financial;Chronic Illness    Comments Will continue to monitor and offer support as needed          Vocational Rehabilitation: Provide vocational rehab assistance to qualifying candidates.   Vocational Rehab Evaluation & Intervention:  Vocational Rehab - 09/05/24 1427       Initial Vocational Rehab Evaluation & Intervention   Assessment shows need for Vocational Rehabilitation No   No needs at this time         Education: Education Goals: Education classes will be provided on a weekly basis, covering required topics. Participant will state understanding/return demonstration of topics presented.    Education     Row Name 09/09/24 0700     Education   Cardiac Education Topics Pritikin   Select Core Videos     Core Videos   Educator Exercise Physiologist   Select Exercise Education   Exercise Education Improving Performance   Instruction Review Code 1- Verbalizes Understanding   Class Start Time 0820   Class Stop Time 0855   Class Time Calculation (min) 35 min    Row Name 09/11/24 1000     Education   Cardiac Education Topics Pritikin   Customer Service Manager   Weekly Topic International Cuisine- Spotlight on the [employer] Zones   Instruction Review Code 1- Verbalizes Understanding   Class Start Time 1145   Class Stop Time 1224   Class Time Calculation (min) 39 min    Row Name 09/13/24 1200     Education   Cardiac Education Topics  Pritikin   Research Officer, Political Party --   Select --   Nutrition --   Instruction Review Code --   Class Start Time --   Class Stop Time --   Class Time Calculation (min) --     Workshops   Geophysical Data Processor Nutrition   Nutrition Workshop Fueling a Forensic Psychologist   Instruction Review Code 1- Tefl Teacher Understanding   Class Start Time 1150   Class Stop Time 1229   Class Time Calculation (min) 39 min    Row Name 09/18/24 1000     Education   Cardiac Education Topics Pritikin   Customer Service Manager   Weekly Topic Simple Sides and Sauces   Instruction Review Code 1- Verbalizes Understanding   Class Start Time 1146   Class Stop Time 1221   Class Time Calculation (min) 35 min      Core Videos: Exercise    Move It!  Clinical staff conducted group or individual video education with verbal and written material and guidebook.  Patient learns the recommended Pritikin exercise program. Exercise with the goal of living a long, healthy life. Some of the health benefits of exercise include controlled diabetes, healthier blood pressure levels, improved cholesterol levels, improved heart and lung capacity, improved sleep, and better body composition. Everyone should speak with their doctor before starting or changing an exercise routine.  Biomechanical Limitations Clinical staff conducted group or individual video education with verbal and written material and guidebook.  Patient learns how biomechanical limitations can impact exercise and how we can mitigate  and possibly overcome limitations to have an impactful and balanced exercise routine.  Body Composition Clinical staff conducted group or individual video education with verbal and written material and guidebook.  Patient learns that body composition (ratio of muscle mass to fat mass) is a key component to assessing overall fitness, rather than body weight alone.  Increased fat mass, especially visceral belly fat, can put us  at increased risk for metabolic syndrome, type 2 diabetes, heart disease, and even death. It is recommended to combine diet and exercise (cardiovascular and resistance training) to improve your body composition. Seek guidance from your physician and exercise physiologist before implementing an exercise routine.  Exercise Action Plan Clinical staff conducted group or individual video education with verbal and written material and guidebook.  Patient learns the recommended strategies to achieve and enjoy long-term exercise adherence, including variety, self-motivation, self-efficacy, and positive decision making. Benefits of exercise include fitness, good health, weight management, more energy, better sleep, less stress, and overall well-being.  Medical   Heart Disease Risk Reduction Clinical staff conducted group or individual video education with verbal and written material and guidebook.  Patient learns our heart is our most vital organ as it circulates oxygen, nutrients, white blood cells, and hormones throughout the entire body, and carries waste away. Data supports a plant-based eating plan like the Pritikin Program for its effectiveness in slowing progression of and reversing heart disease. The video provides a number of recommendations to address heart disease.   Metabolic Syndrome and Belly Fat  Clinical staff conducted group or individual video education with verbal and written material and guidebook.  Patient learns what metabolic syndrome is, how it leads to heart disease, and how one can reverse it and keep it from coming back. You have metabolic syndrome if you have 3 of the following 5 criteria: abdominal obesity, high blood pressure, high triglycerides, low HDL cholesterol, and high blood sugar.  Hypertension and Heart Disease Clinical staff conducted group or individual video education with verbal and written material and  guidebook.  Patient learns that high blood pressure, or hypertension, is very common in the [place] . Hypertension is largely due to excessive salt intake, but other important risk factors include being overweight, physical inactivity, drinking too much alcohol, smoking, and not eating enough potassium from fruits and vegetables. High blood pressure is a leading risk factor for heart attack, stroke, congestive heart failure, dementia, kidney failure, and premature death. Long-term effects of excessive salt intake include stiffening of the arteries and thickening of heart muscle and organ damage. Recommendations include ways to reduce hypertension and the risk of heart disease.  Diseases of Our Time - Focusing on Diabetes Clinical staff conducted group or individual video education with verbal and written material and guidebook.  Patient learns why the best way to stop diseases of our time is prevention, through food and other lifestyle changes. Medicine (such as prescription pills and surgeries) is often only a Band-Aid on the problem, not a long-term solution. Most common diseases of our time include obesity, type 2 diabetes, hypertension, heart disease, and cancer. The Pritikin Program is recommended and has been proven to help reduce, reverse, and/or prevent the damaging effects of metabolic syndrome.  Nutrition   Overview of the Pritikin Eating Plan  Clinical staff conducted group or individual video education with verbal and written material and guidebook.  Patient learns about the Pritikin Eating Plan for disease risk reduction. The Pritikin Eating Plan emphasizes a wide variety of unrefined, minimally-processed carbohydrates, like  fruits, vegetables, whole grains, and legumes. Go, Caution, and Stop food choices are explained. Plant-based and lean animal proteins are emphasized. Rationale provided for low sodium intake for blood pressure control, low added sugars for blood sugar stabilization,  and low added fats and oils for coronary artery disease risk reduction and weight management.  Calorie Density  Clinical staff conducted group or individual video education with verbal and written material and guidebook.  Patient learns about calorie density and how it impacts the Pritikin Eating Plan. Knowing the characteristics of the food you choose will help you decide whether those foods will lead to weight gain or weight loss, and whether you want to consume more or less of them. Weight loss is usually a side effect of the Pritikin Eating Plan because of its focus on low calorie-dense foods.  Label Reading  Clinical staff conducted group or individual video education with verbal and written material and guidebook.  Patient learns about the Pritikin recommended label reading guidelines and corresponding recommendations regarding calorie density, added sugars, sodium content, and whole grains.  Dining Out - Part 1  Clinical staff conducted group or individual video education with verbal and written material and guidebook.  Patient learns that restaurant meals can be sabotaging because they can be so high in calories, fat, sodium, and/or sugar. Patient learns recommended strategies on how to positively address this and avoid unhealthy pitfalls.  Facts on Fats  Clinical staff conducted group or individual video education with verbal and written material and guidebook.  Patient learns that lifestyle modifications can be just as effective, if not more so, as many medications for lowering your risk of heart disease. A Pritikin lifestyle can help to reduce your risk of inflammation and atherosclerosis (cholesterol build-up, or plaque, in the artery walls). Lifestyle interventions such as dietary choices and physical activity address the cause of atherosclerosis. A review of the types of fats and their impact on blood cholesterol levels, along with dietary recommendations to reduce fat intake is also  included.  Nutrition Action Plan  Clinical staff conducted group or individual video education with verbal and written material and guidebook.  Patient learns how to incorporate Pritikin recommendations into their lifestyle. Recommendations include planning and keeping personal health goals in mind as an important part of their success.  Healthy Mind-Set    Healthy Minds, Bodies, Hearts  Clinical staff conducted group or individual video education with verbal and written material and guidebook.  Patient learns how to identify when they are stressed. Video will discuss the impact of that stress, as well as the many benefits of stress management. Patient will also be introduced to stress management techniques. The way we think, act, and feel has an impact on our hearts.  How Our Thoughts Can Heal Our Hearts  Clinical staff conducted group or individual video education with verbal and written material and guidebook.  Patient learns that negative thoughts can cause depression and anxiety. This can result in negative lifestyle behavior and serious health problems. Cognitive behavioral therapy is an effective method to help control our thoughts in order to change and improve our emotional outlook.  Additional Videos:  Exercise    Improving Performance  Clinical staff conducted group or individual video education with verbal and written material and guidebook.  Patient learns to use a non-linear approach by alternating intensity levels and lengths of time spent exercising to help burn more calories and lose more body fat. Cardiovascular exercise helps improve heart health, metabolism, hormonal balance, blood  sugar control, and recovery from fatigue. Resistance training improves strength, endurance, balance, coordination, reaction time, metabolism, and muscle mass. Flexibility exercise improves circulation, posture, and balance. Seek guidance from your physician and exercise physiologist before  implementing an exercise routine and learn your capabilities and proper form for all exercise.  Introduction to Yoga  Clinical staff conducted group or individual video education with verbal and written material and guidebook.  Patient learns about yoga, a discipline of the coming together of mind, breath, and body. The benefits of yoga include improved flexibility, improved range of motion, better posture and core strength, increased lung function, weight loss, and positive self-image. Yogas heart health benefits include lowered blood pressure, healthier heart rate, decreased cholesterol and triglyceride levels, improved immune function, and reduced stress. Seek guidance from your physician and exercise physiologist before implementing an exercise routine and learn your capabilities and proper form for all exercise.  Medical   Aging: Enhancing Your Quality of Life  Clinical staff conducted group or individual video education with verbal and written material and guidebook.  Patient learns key strategies and recommendations to stay in good physical health and enhance quality of life, such as prevention strategies, having an advocate, securing a Health Care Proxy and Power of Attorney, and keeping a list of medications and system for tracking them. It also discusses how to avoid risk for bone loss.  Biology of Weight Control  Clinical staff conducted group or individual video education with verbal and written material and guidebook.  Patient learns that weight gain occurs because we consume more calories than we burn (eating more, moving less). Even if your body weight is normal, you may have higher ratios of fat compared to muscle mass. Too much body fat puts you at increased risk for cardiovascular disease, heart attack, stroke, type 2 diabetes, and obesity-related cancers. In addition to exercise, following the Pritikin Eating Plan can help reduce your risk.  Decoding Lab Results  Clinical staff  conducted group or individual video education with verbal and written material and guidebook.  Patient learns that lab test reflects one measurement whose values change over time and are influenced by many factors, including medication, stress, sleep, exercise, food, hydration, pre-existing medical conditions, and more. It is recommended to use the knowledge from this video to become more involved with your lab results and evaluate your numbers to speak with your doctor.   Diseases of Our Time - Overview  Clinical staff conducted group or individual video education with verbal and written material and guidebook.  Patient learns that according to the CDC, 50% to 70% of chronic diseases (such as obesity, type 2 diabetes, elevated lipids, hypertension, and heart disease) are avoidable through lifestyle improvements including healthier food choices, listening to satiety cues, and increased physical activity.  Sleep Disorders Clinical staff conducted group or individual video education with verbal and written material and guidebook.  Patient learns how good quality and duration of sleep are important to overall health and well-being. Patient also learns about sleep disorders and how they impact health along with recommendations to address them, including discussing with a physician.  Nutrition  Dining Out - Part 2 Clinical staff conducted group or individual video education with verbal and written material and guidebook.  Patient learns how to plan ahead and communicate in order to maximize their dining experience in a healthy and nutritious manner. Included are recommended food choices based on the type of restaurant the patient is visiting.   Fueling a Forensic Psychologist  Clinical staff conducted group or individual video education with verbal and written material and guidebook.  There is a strong connection between our food choices and our health. Diseases like obesity and type 2 diabetes are very  prevalent and are in large-part due to lifestyle choices. The Pritikin Eating Plan provides plenty of food and hunger-curbing satisfaction. It is easy to follow, affordable, and helps reduce health risks.  Menu Workshop  Clinical staff conducted group or individual video education with verbal and written material and guidebook.  Patient learns that restaurant meals can sabotage health goals because they are often packed with calories, fat, sodium, and sugar. Recommendations include strategies to plan ahead and to communicate with the manager, chef, or server to help order a healthier meal.  Planning Your Eating Strategy  Clinical staff conducted group or individual video education with verbal and written material and guidebook.  Patient learns about the Pritikin Eating Plan and its benefit of reducing the risk of disease. The Pritikin Eating Plan does not focus on calories. Instead, it emphasizes high-quality, nutrient-rich foods. By knowing the characteristics of the foods, we choose, we can determine their calorie density and make informed decisions.  Targeting Your Nutrition Priorities  Clinical staff conducted group or individual video education with verbal and written material and guidebook.  Patient learns that lifestyle habits have a tremendous impact on disease risk and progression. This video provides eating and physical activity recommendations based on your personal health goals, such as reducing LDL cholesterol, losing weight, preventing or controlling type 2 diabetes, and reducing high blood pressure.  Vitamins and Minerals  Clinical staff conducted group or individual video education with verbal and written material and guidebook.  Patient learns different ways to obtain key vitamins and minerals, including through a recommended healthy diet. It is important to discuss all supplements you take with your doctor.   Healthy Mind-Set    Smoking Cessation  Clinical staff conducted group  or individual video education with verbal and written material and guidebook.  Patient learns that cigarette smoking and tobacco addiction pose a serious health risk which affects millions of people. Stopping smoking will significantly reduce the risk of heart disease, lung disease, and many forms of cancer. Recommended strategies for quitting are covered, including working with your doctor to develop a successful plan.  Culinary   Becoming a Set Designer conducted group or individual video education with verbal and written material and guidebook.  Patient learns that cooking at home can be healthy, cost-effective, quick, and puts them in control. Keys to cooking healthy recipes will include looking at your recipe, assessing your equipment needs, planning ahead, making it simple, choosing cost-effective seasonal ingredients, and limiting the use of added fats, salts, and sugars.  Cooking - Breakfast and Snacks  Clinical staff conducted group or individual video education with verbal and written material and guidebook.  Patient learns how important breakfast is to satiety and nutrition through the entire day. Recommendations include key foods to eat during breakfast to help stabilize blood sugar levels and to prevent overeating at meals later in the day. Planning ahead is also a key component.  Cooking - Educational Psychologist conducted group or individual video education with verbal and written material and guidebook.  Patient learns eating strategies to improve overall health, including an approach to cook more at home. Recommendations include thinking of animal protein as a side on your plate rather than center stage and focusing instead on lower  calorie dense options like vegetables, fruits, whole grains, and plant-based proteins, such as beans. Making sauces in large quantities to freeze for later and leaving the skin on your vegetables are also recommended to maximize  your experience.  Cooking - Healthy Salads and Dressing Clinical staff conducted group or individual video education with verbal and written material and guidebook.  Patient learns that vegetables, fruits, whole grains, and legumes are the foundations of the Pritikin Eating Plan. Recommendations include how to incorporate each of these in flavorful and healthy salads, and how to create homemade salad dressings. Proper handling of ingredients is also covered. Cooking - Soups and [employer] - Soups and Desserts Clinical staff conducted group or individual video education with verbal and written material and guidebook.  Patient learns that Pritikin soups and desserts make for easy, nutritious, and delicious snacks and meal components that are low in sodium, fat, sugar, and calorie density, while high in vitamins, minerals, and filling fiber. Recommendations include simple and healthy ideas for soups and desserts.   Overview     The Pritikin Solution Program Overview Clinical staff conducted group or individual video education with verbal and written material and guidebook.  Patient learns that the results of the Pritikin Program have been documented in more than 100 articles published in peer-reviewed journals, and the benefits include reducing risk factors for (and, in some cases, even reversing) high cholesterol, high blood pressure, type 2 diabetes, obesity, and more! An overview of the three key pillars of the Pritikin Program will be covered: eating well, doing regular exercise, and having a healthy mind-set.  WORKSHOPS  Exercise: Exercise Basics: Building Your Action Plan Clinical staff led group instruction and group discussion with PowerPoint presentation and patient guidebook. To enhance the learning environment the use of posters, models and videos may be added. At the conclusion of this workshop, patients will comprehend the difference between physical activity and exercise, as well  as the benefits of incorporating both, into their routine. Patients will understand the FITT (Frequency, Intensity, Time, and Type) principle and how to use it to build an exercise action plan. In addition, safety concerns and other considerations for exercise and cardiac rehab will be addressed by the presenter. The purpose of this lesson is to promote a comprehensive and effective weekly exercise routine in order to improve patients overall level of fitness.   Managing Heart Disease: Your Path to a Healthier Heart Clinical staff led group instruction and group discussion with PowerPoint presentation and patient guidebook. To enhance the learning environment the use of posters, models and videos may be added.At the conclusion of this workshop, patients will understand the anatomy and physiology of the heart. Additionally, they will understand how Pritikins three pillars impact the risk factors, the progression, and the management of heart disease.  The purpose of this lesson is to provide a high-level overview of the heart, heart disease, and how the Pritikin lifestyle positively impacts risk factors.  Exercise Biomechanics Clinical staff led group instruction and group discussion with PowerPoint presentation and patient guidebook. To enhance the learning environment the use of posters, models and videos may be added. Patients will learn how the structural parts of their bodies function and how these functions impact their daily activities, movement, and exercise. Patients will learn how to promote a neutral spine, learn how to manage pain, and identify ways to improve their physical movement in order to promote healthy living. The purpose of this lesson is to expose patients to common  physical limitations that impact physical activity. Participants will learn practical ways to adapt and manage aches and pains, and to minimize their effect on regular exercise. Patients will learn how to  maintain good posture while sitting, walking, and lifting.  Balance Training and Fall Prevention  Clinical staff led group instruction and group discussion with PowerPoint presentation and patient guidebook. To enhance the learning environment the use of posters, models and videos may be added. At the conclusion of this workshop, patients will understand the importance of their sensorimotor skills (vision, proprioception, and the vestibular system) in maintaining their ability to balance as they age. Patients will apply a variety of balancing exercises that are appropriate for their current level of function. Patients will understand the common causes for poor balance, possible solutions to these problems, and ways to modify their physical environment in order to minimize their fall risk. The purpose of this lesson is to teach patients about the importance of maintaining balance as they age and ways to minimize their risk of falling.  WORKSHOPS   Nutrition:  Fueling a Ship Broker led group instruction and group discussion with PowerPoint presentation and patient guidebook. To enhance the learning environment the use of posters, models and videos may be added. Patients will review the foundational principles of the Pritikin Eating Plan and understand what constitutes a serving size in each of the food groups. Patients will also learn Pritikin-friendly foods that are better choices when away from home and review make-ahead meal and snack options. Calorie density will be reviewed and applied to three nutrition priorities: weight maintenance, weight loss, and weight gain. The purpose of this lesson is to reinforce (in a group setting) the key concepts around what patients are recommended to eat and how to apply these guidelines when away from home by planning and selecting Pritikin-friendly options. Patients will understand how calorie density may be adjusted for different weight management  goals.  Mindful Eating  Clinical staff led group instruction and group discussion with PowerPoint presentation and patient guidebook. To enhance the learning environment the use of posters, models and videos may be added. Patients will briefly review the concepts of the Pritikin Eating Plan and the importance of low-calorie dense foods. The concept of mindful eating will be introduced as well as the importance of paying attention to internal hunger signals. Triggers for non-hunger eating and techniques for dealing with triggers will be explored. The purpose of this lesson is to provide patients with the opportunity to review the basic principles of the Pritikin Eating Plan, discuss the value of eating mindfully and how to measure internal cues of hunger and fullness using the Hunger Scale. Patients will also discuss reasons for non-hunger eating and learn strategies to use for controlling emotional eating.  Targeting Your Nutrition Priorities Clinical staff led group instruction and group discussion with PowerPoint presentation and patient guidebook. To enhance the learning environment the use of posters, models and videos may be added. Patients will learn how to determine their genetic susceptibility to disease by reviewing their family history. Patients will gain insight into the importance of diet as part of an overall healthy lifestyle in mitigating the impact of genetics and other environmental insults. The purpose of this lesson is to provide patients with the opportunity to assess their personal nutrition priorities by looking at their family history, their own health history and current risk factors. Patients will also be able to discuss ways of prioritizing and modifying the Pritikin Eating Plan for  their highest risk areas  Menu  Clinical staff led group instruction and group discussion with PowerPoint presentation and patient guidebook. To enhance the learning environment the use of posters,  models and videos may be added. Using menus brought in from [employer], or printed from [employer], patients will apply the Pritikin dining out guidelines that were presented in the [employer] video. Patients will also be able to practice these guidelines in a variety of provided scenarios. The purpose of this lesson is to provide patients with the opportunity to practice hands-on learning of the Pritikin Dining Out guidelines with actual menus and practice scenarios.  Label Reading Clinical staff led group instruction and group discussion with PowerPoint presentation and patient guidebook. To enhance the learning environment the use of posters, models and videos may be added. Patients will review and discuss the Pritikin label reading guidelines presented in Pritikins Label Reading Educational series video. Using fool labels brought in from local grocery stores and markets, patients will apply the label reading guidelines and determine if the packaged food meet the Pritikin guidelines. The purpose of this lesson is to provide patients with the opportunity to review, discuss, and practice hands-on learning of the Pritikin Label Reading guidelines with actual packaged food labels. Cooking School  Pritikins [employer] are designed to teach patients ways to prepare quick, simple, and affordable recipes at home. The importance of nutritions role in chronic disease risk reduction is reflected in its emphasis in the overall Pritikin program. By learning how to prepare essential core Pritikin Eating Plan recipes, patients will increase control over what they eat; be able to customize the flavor of foods without the use of added salt, sugar, or fat; and improve the quality of the food they consume. By learning a set of core recipes which are easily assembled, quickly prepared, and affordable, patients are more likely to prepare more healthy foods at home. These workshops  focus on convenient breakfasts, simple entres, side dishes, and desserts which can be prepared with minimal effort and are consistent with nutrition recommendations for cardiovascular risk reduction. Cooking [employer] are taught by a armed forces logistics/support/administrative officer (RD) who has been trained by the [employer]. The chef or RD has a clear understanding of the importance of minimizing - if not completely eliminating - added fat, sugar, and sodium in recipes. Throughout the series of Cooking School Workshop sessions, patients will learn about healthy ingredients and efficient methods of cooking to build confidence in their capability to prepare    Cooking School weekly topics:  Adding Flavor- Sodium-Free  Fast and Healthy Breakfasts  Powerhouse Plant-Based Proteins  Satisfying Salads and Dressings  Simple Sides and Sauces  International Cuisine-Spotlight on the [employer] Zones  Delicious Desserts  Savory Soups  [employer] - Meals in a Astronomer Appetizers and Snacks  Comforting Weekend Breakfasts  One-Pot Wonders   Fast Evening Meals  Landscape Architect Your Pritikin Plate  WORKSHOPS   Healthy Mindset (Psychosocial):  Focused Goals, Sustainable Changes Clinical staff led group instruction and group discussion with PowerPoint presentation and patient guidebook. To enhance the learning environment the use of posters, models and videos may be added. Patients will be able to apply effective goal setting strategies to establish at least one personal goal, and then take consistent, meaningful action toward that goal. They will learn to identify common barriers to achieving personal goals and develop strategies to overcome them. Patients will also gain  an understanding of how our mind-set can impact our ability to achieve goals and the importance of cultivating a positive and growth-oriented mind-set. The purpose of this lesson is to provide patients with a deeper  understanding of how to set and achieve personal goals, as well as the tools and strategies needed to overcome common obstacles which may arise along the way.  From Head to Heart: The Power of a Healthy Outlook  Clinical staff led group instruction and group discussion with PowerPoint presentation and patient guidebook. To enhance the learning environment the use of posters, models and videos may be added. Patients will be able to recognize and describe the impact of emotions and mood on physical health. They will discover the importance of self-care and explore self-care practices which may work for them. Patients will also learn how to utilize the 4 Cs to cultivate a healthier outlook and better manage stress and challenges. The purpose of this lesson is to demonstrate to patients how a healthy outlook is an essential part of maintaining good health, especially as they continue their cardiac rehab journey.  Healthy Sleep for a Healthy Heart Clinical staff led group instruction and group discussion with PowerPoint presentation and patient guidebook. To enhance the learning environment the use of posters, models and videos may be added. At the conclusion of this workshop, patients will be able to demonstrate knowledge of the importance of sleep to overall health, well-being, and quality of life. They will understand the symptoms of, and treatments for, common sleep disorders. Patients will also be able to identify daytime and nighttime behaviors which impact sleep, and they will be able to apply these tools to help manage sleep-related challenges. The purpose of this lesson is to provide patients with a general overview of sleep and outline the importance of quality sleep. Patients will learn about a few of the most common sleep disorders. Patients will also be introduced to the concept of sleep hygiene, and discover ways to self-manage certain sleeping problems through simple daily behavior changes.  Finally, the workshop will motivate patients by clarifying the links between quality sleep and their goals of heart-healthy living.   Recognizing and Reducing Stress Clinical staff led group instruction and group discussion with PowerPoint presentation and patient guidebook. To enhance the learning environment the use of posters, models and videos may be added. At the conclusion of this workshop, patients will be able to understand the types of stress reactions, differentiate between acute and chronic stress, and recognize the impact that chronic stress has on their health. They will also be able to apply different coping mechanisms, such as reframing negative self-talk. Patients will have the opportunity to practice a variety of stress management techniques, such as deep abdominal breathing, progressive muscle relaxation, and/or guided imagery.  The purpose of this lesson is to educate patients on the role of stress in their lives and to provide healthy techniques for coping with it.  Learning Barriers/Preferences:  Learning Barriers/Preferences - 09/05/24 1153       Learning Barriers/Preferences   Learning Barriers None    Learning Preferences Skilled Demonstration;Pictoral;Written Material;Group Instruction;Audio;Computer/Internet;Verbal Instruction;Video;Individual Instruction          Education Topics:  Knowledge Questionnaire Score:  Knowledge Questionnaire Score - 09/05/24 1426       Knowledge Questionnaire Score   Pre Score 23/24          Core Components/Risk Factors/Patient Goals at Admission:  Personal Goals and Risk Factors at Admission - 09/05/24 1428  Core Components/Risk Factors/Patient Goals on Admission    Weight Management Yes;Obesity;Weight Loss    Intervention Weight Management: Develop a combined nutrition and exercise program designed to reach desired caloric intake, while maintaining appropriate intake of nutrient and fiber, sodium and fats, and  appropriate energy expenditure required for the weight goal.;Weight Management: Provide education and appropriate resources to help participant work on and attain dietary goals.;Weight Management/Obesity: Establish reasonable short term and long term weight goals.;Obesity: Provide education and appropriate resources to help participant work on and attain dietary goals.    Admit Weight 227 lb 11.8 oz (103.3 kg)    Expected Outcomes Short Term: Continue to assess and modify interventions until short term weight is achieved;Long Term: Adherence to nutrition and physical activity/exercise program aimed toward attainment of established weight goal;Weight Maintenance: Understanding of the daily nutrition guidelines, which includes 25-35% calories from fat, 7% or less cal from saturated fats, less than 200mg  cholesterol, less than 1.5gm of sodium, & 5 or more servings of fruits and vegetables daily;Weight Loss: Understanding of general recommendations for a balanced deficit meal plan, which promotes 1-2 lb weight loss per week and includes a negative energy balance of [phone] kcal/d;Understanding recommendations for meals to include 15-35% energy as protein, 25-35% energy from fat, 35-60% energy from carbohydrates, less than 200mg  of dietary cholesterol, 20-35 gm of total fiber daily;Understanding of distribution of calorie intake throughout the day with the consumption of 4-5 meals/snacks    Diabetes Yes    Intervention Provide education about signs/symptoms and action to take for hypo/hyperglycemia.;Provide education about proper nutrition, including hydration, and aerobic/resistive exercise prescription along with prescribed medications to achieve blood glucose in normal ranges: Fasting glucose 65-99 mg/dL    Expected Outcomes Short Term: Participant verbalizes understanding of the signs/symptoms and immediate care of hyper/hypoglycemia, proper foot care and importance of medication, aerobic/resistive exercise and  nutrition plan for blood glucose control.;Long Term: Attainment of HbA1C < 7%.    Hypertension Yes    Intervention Provide education on lifestyle modifcations including regular physical activity/exercise, weight management, moderate sodium restriction and increased consumption of fresh fruit, vegetables, and low fat dairy, alcohol moderation, and smoking cessation.;Monitor prescription use compliance.    Expected Outcomes Short Term: Continued assessment and intervention until BP is < 140/93mm HG in hypertensive participants. < 130/109mm HG in hypertensive participants with diabetes, heart failure or chronic kidney disease.;Long Term: Maintenance of blood pressure at goal levels.    Lipids Yes    Intervention Provide education and support for participant on nutrition & aerobic/resistive exercise along with prescribed medications to achieve LDL 70mg , HDL >40mg .    Expected Outcomes Short Term: Participant states understanding of desired cholesterol values and is compliant with medications prescribed. Participant is following exercise prescription and nutrition guidelines.;Long Term: Cholesterol controlled with medications as prescribed, with individualized exercise RX and with personalized nutrition plan. Value goals: LDL < 70mg , HDL > 40 mg.    Personal Goal Other Yes    Personal Goal Continues to have poor appetite, balance issues, increase stamina (currently tired after ADL's like showering)    Intervention Will continue to monitor pt and progress worklaods as tolerated without sign or symptom    Expected Outcomes Pt will achieve his goals          Core Components/Risk Factors/Patient Goals Review:   Goals and Risk Factor Review     Row Name 09/12/24 0936 09/23/24 1113           Core Components/Risk Factors/Patient Goals Review  Personal Goals Review Weight Management/Obesity;Diabetes;Hypertension;Lipids;Stress Weight Management/Obesity;Diabetes;Hypertension;Lipids;Stress      Review  Quantavious started cardiac rehab on 09/09/24. Ishaaq is off to a good start to exercise. Vital signs were stable. CBG's dropped into the 70's on 09/11/24. Asymptomatic. Patient's PCP notified about CBG's . Sysytolic BP's noted in upper 90's to low 100's. Will continue to monitor BP. SABRABion is off to a good start to exercise. Vital signs were stable. CBG's have been variable with some low readings post exercise. metformin  has been placed on hold. Will continue to monitor CBG's      Expected Outcomes Zyhir will continue to participate in cardiac rehab for exercise, nutriition and lifestyle modifications. Viola will continue to participate in cardiac rehab for exercise, nutriition and lifestyle modifications.         Core Components/Risk Factors/Patient Goals at Discharge (Final Review):   Goals and Risk Factor Review - 09/23/24 1113       Core Components/Risk Factors/Patient Goals Review   Personal Goals Review Weight Management/Obesity;Diabetes;Hypertension;Lipids;Stress    Review .Emit is off to a good start to exercise. Vital signs were stable. CBG's have been variable with some low readings post exercise. metformin  has been placed on hold. Will continue to monitor CBG's    Expected Outcomes Juma will continue to participate in cardiac rehab for exercise, nutriition and lifestyle modifications.          ITP Comments:  ITP Comments     Row Name 09/05/24 9164 09/09/24 1624 09/23/24 1100       ITP Comments Wilbert Bihari, MD: Medical Director. Introduction to the Pritikin Education Program/Intensive Cardiac Rehab. Inital orientation packet reviewed with the patient. 30 Day ITP Review. Benito started cardiac rehab today (08/12/24). Jahsir is off to a good start with exercise. 30 Day ITP Review. Jahlani has good attendance and participation with exercise at  cardiac rehab        Comments: See ITP Comments     [1]  Current Outpatient Medications:    acetaminophen  (TYLENOL ) 325 MG tablet,  Take 2 tablets (650 mg total) by mouth every 6 (six) hours as needed for mild pain (pain score 1-3)., Disp: , Rfl:    aspirin  EC 325 MG tablet, Take 1 tablet (325 mg total) by mouth daily., Disp: , Rfl:    atorvastatin  (LIPITOR) 40 MG tablet, Take 40 mg by mouth daily., Disp: , Rfl:    cetirizine (ZYRTEC) 10 MG tablet, Take 10 mg by mouth daily., Disp: , Rfl:    diphenhydrAMINE  (BENADRYL ) 25 mg capsule, Take 1 capsule (25 mg total) by mouth every 8 (eight) hours as needed for itching., Disp: , Rfl:    FARXIGA  10 MG TABS tablet, Take 10 mg by mouth daily. , Disp: , Rfl:    gabapentin  (NEURONTIN ) 100 MG capsule, TAKE 2 CAPSULES(200 MG) BY MOUTH AT BEDTIME (Patient taking differently: Take 400 mg by mouth at bedtime.), Disp: 180 capsule, Rfl: 1   losartan  (COZAAR ) 25 MG tablet, Take 1 tablet (25 mg total) by mouth daily. Take blood pressure if systolic is less than 95 pt is to hold Losartan  dose, Disp: , Rfl:    metFORMIN  (GLUCOPHAGE ) 500 MG tablet, Take 1,000 mg by mouth 2 (two) times daily with a meal., Disp: , Rfl:    metoprolol  tartrate (LOPRESSOR ) 25 MG tablet, Take 1.5 tablets (37.5 mg total) by mouth 2 (two) times daily., Disp: 270 tablet, Rfl: 3   mometasone (NASONEX) 50 MCG/ACT nasal spray, Place 2 sprays into the nose daily  as needed (Rhinitis)., Disp: , Rfl:    Multiple Vitamin (MULTIVITAMIN) capsule, Take 1 capsule by mouth daily., Disp: , Rfl:    tirzepatide (MOUNJARO) 12.5 MG/0.5ML Pen, Inject 12.5 mg into the skin once a week., Disp: , Rfl:    tiZANidine  (ZANAFLEX ) 2 MG tablet, Take 1 tablet (2 mg total) by mouth at bedtime. (Patient taking differently: Take 4 mg by mouth at bedtime.), Disp: 90 tablet, Rfl: 1   traMADol  (ULTRAM ) 50 MG tablet, Take 1 tablet (50 mg total) by mouth every 6 (six) hours as needed for severe pain (pain score 7-10)., Disp: 28 tablet, Rfl: 0 [2]  Social History Tobacco Use  Smoking Status Former   Current packs/day: 0.00   Average packs/day: 0.5 packs/day for  12.0 years (6.0 ttl pk-yrs)   Types: Cigarettes   Start date: 12/15/1979   Quit date: 12/15/1991   Years since quitting: 32.8  Smokeless Tobacco Former   Types: Snuff  Tobacco Comments   quit snuff in ~ 1981

## 2024-09-25 ENCOUNTER — Encounter (HOSPITAL_COMMUNITY)

## 2024-09-25 ENCOUNTER — Encounter (HOSPITAL_COMMUNITY)
Admission: RE | Admit: 2024-09-25 | Discharge: 2024-09-25 | Disposition: A | Discharge status: Home / Self Care | Source: Ambulatory Visit | Attending: Cardiology | Admitting: Cardiology

## 2024-09-25 DIAGNOSIS — Z952 Presence of prosthetic heart valve: Secondary | ICD-10-CM

## 2024-09-25 DIAGNOSIS — Z951 Presence of aortocoronary bypass graft: Secondary | ICD-10-CM

## 2024-09-27 ENCOUNTER — Encounter (HOSPITAL_COMMUNITY)
Admission: RE | Admit: 2024-09-27 | Discharge: 2024-09-27 | Disposition: A | Discharge status: Home / Self Care | Source: Ambulatory Visit | Attending: Cardiology | Admitting: Cardiology

## 2024-09-27 ENCOUNTER — Encounter (HOSPITAL_COMMUNITY)

## 2024-09-27 DIAGNOSIS — Z952 Presence of prosthetic heart valve: Secondary | ICD-10-CM | POA: Insufficient documentation

## 2024-09-27 DIAGNOSIS — Z951 Presence of aortocoronary bypass graft: Secondary | ICD-10-CM | POA: Diagnosis present

## 2024-09-27 DIAGNOSIS — Z48812 Encounter for surgical aftercare following surgery on the circulatory system: Secondary | ICD-10-CM | POA: Insufficient documentation

## 2024-09-30 ENCOUNTER — Encounter (HOSPITAL_COMMUNITY)
Admission: RE | Admit: 2024-09-30 | Discharge: 2024-09-30 | Disposition: A | Discharge status: Home / Self Care | Source: Ambulatory Visit | Attending: Cardiology | Admitting: Cardiology

## 2024-09-30 ENCOUNTER — Encounter (HOSPITAL_COMMUNITY)

## 2024-09-30 DIAGNOSIS — Z951 Presence of aortocoronary bypass graft: Secondary | ICD-10-CM

## 2024-09-30 DIAGNOSIS — Z952 Presence of prosthetic heart valve: Secondary | ICD-10-CM

## 2024-10-02 ENCOUNTER — Encounter (HOSPITAL_COMMUNITY)

## 2024-10-02 ENCOUNTER — Encounter (HOSPITAL_COMMUNITY)
Admission: RE | Admit: 2024-10-02 | Discharge: 2024-10-02 | Disposition: A | Discharge status: Home / Self Care | Source: Ambulatory Visit | Attending: Cardiology | Admitting: Cardiology

## 2024-10-02 DIAGNOSIS — Z952 Presence of prosthetic heart valve: Secondary | ICD-10-CM | POA: Diagnosis not present

## 2024-10-02 DIAGNOSIS — Z951 Presence of aortocoronary bypass graft: Secondary | ICD-10-CM

## 2024-10-04 ENCOUNTER — Encounter (HOSPITAL_COMMUNITY)
Admission: RE | Admit: 2024-10-04 | Discharge: 2024-10-04 | Disposition: A | Source: Ambulatory Visit | Attending: Cardiology | Admitting: Cardiology

## 2024-10-04 ENCOUNTER — Encounter (HOSPITAL_COMMUNITY)

## 2024-10-04 DIAGNOSIS — Z952 Presence of prosthetic heart valve: Secondary | ICD-10-CM | POA: Diagnosis not present

## 2024-10-04 DIAGNOSIS — Z951 Presence of aortocoronary bypass graft: Secondary | ICD-10-CM

## 2024-10-04 NOTE — Progress Notes (Signed)
 Reviewed home exercise guidelines with Tim Cook including endpoints, temperature precautions, target heart rate and rate of perceived exertion. He is doing some walking for his daily activities as his mode of home exercise. He plans to exercise at a local gym 3-4 days/week upon completion of the cardiac rehab program. Tim Cook voices understanding of instructions given.  Tim CHRISTELLA Gal, MS, ACSM CEP

## 2024-10-07 ENCOUNTER — Encounter (HOSPITAL_COMMUNITY)

## 2024-10-07 ENCOUNTER — Telehealth (HOSPITAL_COMMUNITY): Payer: Self-pay

## 2024-10-07 NOTE — Telephone Encounter (Signed)
 Patient c/o sick for 10:15 CR class, left message stating he is going to see dr today.

## 2024-10-09 ENCOUNTER — Encounter (HOSPITAL_COMMUNITY)

## 2024-10-09 ENCOUNTER — Telehealth (HOSPITAL_COMMUNITY): Payer: Self-pay | Admitting: *Deleted

## 2024-10-09 NOTE — Telephone Encounter (Signed)
 Patient left message on department voicemail yesterday evening. He's out with bronchitis. He's been put on a steroid and plans to return on Friday. He's tested negative for SARS/COVID and flu A/B. Will cancel cardiac rehab appointment for today.

## 2024-10-11 ENCOUNTER — Encounter (HOSPITAL_COMMUNITY)

## 2024-10-11 ENCOUNTER — Encounter (HOSPITAL_COMMUNITY)
Admission: RE | Admit: 2024-10-11 | Discharge: 2024-10-11 | Disposition: A | Discharge status: Home / Self Care | Source: Ambulatory Visit | Attending: Cardiology | Admitting: Cardiology

## 2024-10-11 DIAGNOSIS — Z952 Presence of prosthetic heart valve: Secondary | ICD-10-CM | POA: Diagnosis not present

## 2024-10-11 DIAGNOSIS — Z951 Presence of aortocoronary bypass graft: Secondary | ICD-10-CM

## 2024-10-14 ENCOUNTER — Encounter (HOSPITAL_COMMUNITY)

## 2024-10-14 ENCOUNTER — Encounter (HOSPITAL_COMMUNITY)
Admission: RE | Admit: 2024-10-14 | Discharge: 2024-10-14 | Disposition: A | Source: Ambulatory Visit | Attending: Cardiology | Admitting: Cardiology

## 2024-10-14 DIAGNOSIS — Z951 Presence of aortocoronary bypass graft: Secondary | ICD-10-CM

## 2024-10-14 DIAGNOSIS — Z952 Presence of prosthetic heart valve: Secondary | ICD-10-CM

## 2024-10-16 ENCOUNTER — Encounter: Payer: Self-pay | Admitting: Cardiology

## 2024-10-16 ENCOUNTER — Encounter (HOSPITAL_COMMUNITY)

## 2024-10-16 ENCOUNTER — Encounter (HOSPITAL_COMMUNITY)
Admission: RE | Admit: 2024-10-16 | Discharge: 2024-10-16 | Disposition: A | Source: Ambulatory Visit | Attending: Cardiology | Admitting: Cardiology

## 2024-10-16 DIAGNOSIS — Z952 Presence of prosthetic heart valve: Secondary | ICD-10-CM | POA: Diagnosis not present

## 2024-10-16 DIAGNOSIS — Z951 Presence of aortocoronary bypass graft: Secondary | ICD-10-CM

## 2024-10-18 ENCOUNTER — Encounter (HOSPITAL_COMMUNITY)

## 2024-10-18 ENCOUNTER — Encounter (HOSPITAL_COMMUNITY)
Admission: RE | Admit: 2024-10-18 | Discharge: 2024-10-18 | Disposition: A | Source: Ambulatory Visit | Attending: Cardiology | Admitting: Cardiology

## 2024-10-18 DIAGNOSIS — Z952 Presence of prosthetic heart valve: Secondary | ICD-10-CM

## 2024-10-18 DIAGNOSIS — Z951 Presence of aortocoronary bypass graft: Secondary | ICD-10-CM

## 2024-10-21 ENCOUNTER — Encounter (HOSPITAL_COMMUNITY)

## 2024-10-22 NOTE — Progress Notes (Signed)
 Cardiac Individual Treatment Plan  Patient Details  Name: Tim Cook MRN: [id number] Date of Birth: Nov 19, 1960 Referring Provider:   Flowsheet Row INTENSIVE CARDIAC REHAB ORIENT from 09/05/2024 in [facility]  Referring Provider Dr. Lamar Fitch MD    Initial Encounter Date:  Flowsheet Row INTENSIVE CARDIAC REHAB ORIENT from 09/05/2024 in [facility]  Date 09/05/24    Visit Diagnosis: 07/02/24 S/P CABG x 2  07/02/24 S/P aortic valve replacement  Patient's Home Medications on Admission: Current Medications[1]  Past Medical History: Past Medical History:  Diagnosis Date   Acute lateral meniscal tear 06/09/2016   Arthritis    knees, lower back, hands (05/18/2016)   Atherosclerotic heart disease of native coronary artery without angina pectoris    05/18/16  Cath severe 90% LAD prox, 99% mid, Circ dominant with 30% OM1, 50% OM2, 30% OM3, 60% distal circ and PL, severe stenosis nondominant RCA.  EF 55%  3.0x 20, 2.5 x 20 mm, 2.25 x 16 mm Promus premier sent to LAD Dr. Verlin    Bariatric surgery status 05/18/2016   Benign essential hypertension 01/28/2021   Bursitis of right shoulder 01/21/2019   CAD (coronary artery disease), native coronary artery    05/18/16  Cath severe 90% LAD prox, 99% mid, Circ dominant with 30% OM1, 50% OM2, 30% OM3, 60% distal circ and PL, severe stenosis nondominant RCA.  EF 55%  3.0x 20, 2.5 x 20 mm, 2.25 x 16 mm Promus premier sent to LAD Dr. Verlin    Chronic bronchitis (HCC)    most years (05/18/2016)   Chronic lower back pain    Closed fracture of right distal radius 03/12/2020   Closed fracture of right proximal humerus 03/12/2020   DDD (degenerative disc disease), lumbar    Degenerative arthritis of right knee 05/09/2017   Degenerative disc disease, lumbar 07/07/2016   Degenerative lumbar spinal stenosis 04/07/2021    Diabetes mellitus without complication (HCC)    Dislocation of left middle finger 01/21/2019   Encounter for immunization 01/28/2021   Erectile dysfunction 01/28/2021   Gastro-esophageal reflux 01/28/2021   History of nutritional deficiency 10/26/2021   Hyperglycemia due to type 2 diabetes mellitus (HCC) 01/28/2021   Hyperlipidemia    Hypertensive heart disease without CHF    Lumbago with sciatica, right side 04/07/2021   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 type 2 diabetes mellitus (HCC) 01/28/2021   Mixed hyperlipidemia 05/18/2016   Nonallopathic lesion of lumbosacral region 07/07/2016   Nonallopathic lesion of sacral region 07/07/2016   Nonallopathic lesion of thoracic region 07/07/2016   Obesity    Radiculopathy, lumbar region 04/07/2021   Shoulder joint pain 01/28/2021   Type II diabetes mellitus (HCC)    Unstable angina (HCC)    Vitamin D  deficiency 01/28/2021    Tobacco Use: Tobacco Use History[2]  Labs: Review Flowsheet  More data exists      Latest Ref Rng & Units 03/16/2020 05/14/2024 07/01/2024 07/02/2024 07/03/2024  Labs for ITP Cardiac and Pulmonary Rehab  Cholestrol 0 - 200 mg/dL - - - 896  -  LDL (calc) 0 - 99 mg/dL - - - 44  -  HDL-C >59 mg/dL - - - 43  -  Trlycerides <150 mg/dL - - - 78  -  Hemoglobin A1c 4.8 - 5.6 % 6.9  - 5.4  - -  PH, Arterial 7.35 - 7.45 - 7.352  -  7.377  7.394  7.410  7.438  7.437  7.371  7.367   PCO2 arterial 32 - 48 mmHg - 42.7  - 46.3  32.0  39.0  38.6  37.4  44.3  38.8   Bicarbonate 20.0 - 28.0 mmol/L - 23.7  24.3  26.9  25.9  27.2  19.7  24.7  26.1  25.3  27.5  25.7  22.2   TCO2 22 - 32 mmol/L - 25  26  28  27  29  21  23  26  27  27  26  26  29  27  25  26  23    Acid-base deficit 0.0 - 2.0 mmol/L - 2.0  2.0  - 5.0  3.0   O2 Saturation % - 99  77  74  73  98  99  97  100  100  80  100  97     Details       Multiple values from one day are sorted in reverse-chronological order          Capillary Blood Glucose: Lab Results  Component Value Date   GLUCAP 85 09/18/2024   GLUCAP 87 09/13/2024   GLUCAP 65 (L) 09/13/2024   GLUCAP 128 (H) 09/13/2024   GLUCAP 94 09/11/2024     Exercise Target Goals: Exercise Program Goal: Individual exercise prescription set using results from initial 6 min walk test and THRR while considering  patients activity barriers and safety.   Exercise Prescription Goal: Initial exercise prescription builds to 30-45 minutes a day of aerobic activity, 2-3 days per week.  Home exercise guidelines will be given to patient during program as part of exercise prescription that the participant will acknowledge.  Activity Barriers & Risk Stratification:  Activity Barriers & Cardiac Risk Stratification - 09/05/24 1417       Activity Barriers & Cardiac Risk Stratification   Activity Barriers Balance Concerns;Joint Problems;Arthritis;Back Problems;Deconditioning;Neck/Spine Problems    Cardiac Risk Stratification High   Under 5 MET's         6 Minute Walk:  6 Minute Walk     Row Name 09/05/24 1409         6 Minute Walk   Phase Initial     Distance 1200 feet     Walk Time 6 minutes     # of Rest Breaks 0     MPH 2.27     METS 2.79     RPE 9     Perceived Dyspnea  0     VO2 Peak 9.76     Symptoms Yes (comment)     Comments LBP 4/10 lingers with rest.     Resting HR 97 bpm     Resting BP 110/76     Resting Oxygen Saturation  97 %     Exercise Oxygen Saturation  during 6 min walk 100 %     Max Ex. HR 111 bpm     Max Ex. BP 122/68     2 Minute Post BP 114/64        Oxygen Initial Assessment:   Oxygen Re-Evaluation:   Oxygen Discharge (Final Oxygen Re-Evaluation):   Initial Exercise Prescription:  Initial Exercise Prescription - 09/05/24 1400       Date of Initial Exercise RX and Referring Provider   Date 09/05/24    Referring Provider Dr. Lamar Fitch MD    Expected Discharge Date 11/28/23      NuStep   Level  1  SPM 75    Minutes 15    METs 1.8      Recumbant Elliptical   Level 1    RPM 55    Watts 70    Minutes 15    METs 2.5      Prescription Details   Frequency (times per week) 3    Duration Progress to 30 minutes of continuous aerobic without signs/symptoms of physical distress      Intensity   THRR 40-80% of Max Heartrate 63-126    Ratings of Perceived Exertion 11-13    Perceived Dyspnea 0-4      Progression   Progression Continue progressive overload as per policy without signs/symptoms or physical distress.      Resistance Training   Training Prescription Yes    Weight 3    Reps 10-15          Perform Capillary Blood Glucose checks as needed.  Exercise Prescription Changes:   Exercise Prescription Changes     Row Name 09/09/24 0800 09/23/24 1000 10/04/24 1559 10/18/24 0958       Response to Exercise   Blood Pressure (Admit) 114/64 102/64 102/72 104/60    Blood Pressure (Exercise) 110/60 144/84 124/60 --    Blood Pressure (Exit) 114/72 100/60 100/59 98/62    Heart Rate (Admit) 104 bpm 94 bpm 79 bpm 94 bpm    Heart Rate (Exercise) 125 bpm 123 bpm 125 bpm 119 bpm    Heart Rate (Exit) 113 bpm 87 bpm 88 bpm 96 bpm    Rating of Perceived Exertion (Exercise) 9 8 9 11     Perceived Dyspnea (Exercise) 0 -- -- --    Symptoms -- -- None None    Comments Pt first day in the [employer] program -- Reviewed home exercise guidelines and goals with Zachary. --    Duration Progress to 30 minutes of  aerobic without signs/symptoms of physical distress Progress to 30 minutes of  aerobic without signs/symptoms of physical distress Progress to 30 minutes of  aerobic without signs/symptoms of physical distress Progress to 30 minutes of  aerobic without signs/symptoms of physical distress    Intensity THRR New THRR New THRR New THRR New      Progression   Progression Continue to progress workloads to maintain intensity without signs/symptoms of physical distress. Continue to  progress workloads to maintain intensity without signs/symptoms of physical distress. Continue to progress workloads to maintain intensity without signs/symptoms of physical distress. Continue to progress workloads to maintain intensity without signs/symptoms of physical distress.    Average METs 2 3.1 3.5 2.9      Resistance Training   Training Prescription -- No Yes Yes    Weight 3 -- 4 lbs 4 lbs    Reps 10-15 -- 10-15 10-15    Time 10 Minutes -- 5 Minutes 5 Minutes      Interval Training   Interval Training -- No No No      NuStep   Level 2 2 2 3     SPM 83 117 122 109    Minutes 30 30 30 30     METs 2 3.1 3.5 2.9      Home Exercise Plan   Plans to continue exercise at -- -- Home (comment)  Walking Home (comment)  Walking    Frequency -- -- Add 2 additional days to program exercise sessions. Add 2 additional days to program exercise sessions.    Initial Home Exercises Provided -- -- 10/04/24 10/04/24  Exercise Comments:   Exercise Comments     Row Name 09/09/24 9166 10/04/24 1052         Exercise Comments Pt first day in the [facility] program. Pt tolerated exercise well with an average MET level of 2.0. He is off to a good start and is learning his THRR, RPE and ExRx Reviewed home exercise guidelines and goals with Zachary.         Exercise Goals and Review:   Exercise Goals     Row Name 09/05/24 1424             Exercise Goals   Increase Physical Activity Yes       Intervention Provide advice, education, support and counseling about physical activity/exercise needs.;Develop an individualized exercise prescription for aerobic and resistive training based on initial evaluation findings, risk stratification, comorbidities and participant's personal goals.       Expected Outcomes Short Term: Attend rehab on a regular basis to increase amount of physical activity.;Long Term: Exercising regularly at least 3-5 days a week.;Long Term: Add in home exercise to make  exercise part of routine and to increase amount of physical activity.       Increase Strength and Stamina Yes       Intervention Provide advice, education, support and counseling about physical activity/exercise needs.;Develop an individualized exercise prescription for aerobic and resistive training based on initial evaluation findings, risk stratification, comorbidities and participant's personal goals.       Expected Outcomes Short Term: Perform resistance training exercises routinely during rehab and add in resistance training at home;Short Term: Increase workloads from initial exercise prescription for resistance, speed, and METs.;Long Term: Improve cardiorespiratory fitness, muscular endurance and strength as measured by increased METs and functional capacity ( )       Able to understand and use rate of perceived exertion (RPE) scale Yes       Intervention Provide education and explanation on how to use RPE scale       Expected Outcomes Short Term: Able to use RPE daily in rehab to express subjective intensity level;Long Term:  Able to use RPE to guide intensity level when exercising independently       Knowledge and understanding of Target Heart Rate Range (THRR) Yes       Intervention Provide education and explanation of THRR including how the numbers were predicted and where they are located for reference       Expected Outcomes Short Term: Able to state/look up THRR;Long Term: Able to use THRR to govern intensity when exercising independently;Short Term: Able to use daily as guideline for intensity in rehab       Understanding of Exercise Prescription Yes       Intervention Provide education, explanation, and written materials on patient's individual exercise prescription       Expected Outcomes Short Term: Able to explain program exercise prescription;Long Term: Able to explain home exercise prescription to exercise independently          Exercise Goals Re-Evaluation :  Exercise Goals  Re-Evaluation     Row Name 09/09/24 0831 10/04/24 1052           Exercise Goal Re-Evaluation   Exercise Goals Review -- Increase Physical Activity;Increase Strength and Stamina;Able to understand and use rate of perceived exertion (RPE) scale;Understanding of Exercise Prescription      Comments Pt first day in the [facility] [facility] program. Pt tolerated exercise well with an average MET level of 2.0. He is off  to a good start and is learning his THRR, RPE and ExRx Reviewed exercise prescription with Zachary. He walks during his daily activities but no formal walking for exercise. He plans to join a gym and exercise 3-4 days/week upon completion of the cardiac rehab program. Encouraged him to walk at  home in addition to exercise at cardiac rehab. Will increase load on the recumbent stepper from 2 to 3 next session. Increased hand weights from 3 to 4 lbs today.      Expected Outcomes Will continue to monitor pt and progress workloads as tolerated without sign or symptom Continue to progress workloads as tolerated. Encourage walking for exercise at home on his non-cardiac rehab days to achieve 150 minutes of aerobic exercise/week.         Discharge Exercise Prescription (Final Exercise Prescription Changes):  Exercise Prescription Changes - 10/18/24 0958       Response to Exercise   Blood Pressure (Admit) 104/60    Blood Pressure (Exit) 98/62    Heart Rate (Admit) 94 bpm    Heart Rate (Exercise) 119 bpm    Heart Rate (Exit) 96 bpm    Rating of Perceived Exertion (Exercise) 11    Symptoms None    Duration Progress to 30 minutes of  aerobic without signs/symptoms of physical distress    Intensity THRR New      Progression   Progression Continue to progress workloads to maintain intensity without signs/symptoms of physical distress.    Average METs 2.9      Resistance Training   Training Prescription Yes    Weight 4 lbs    Reps 10-15    Time 5 Minutes      Interval Training   Interval  Training No      NuStep   Level 3    SPM 109    Minutes 30    METs 2.9      Home Exercise Plan   Plans to continue exercise at Home (comment)   Walking   Frequency Add 2 additional days to program exercise sessions.    Initial Home Exercises Provided 10/04/24          Nutrition:  Target Goals: Understanding of nutrition guidelines, daily intake of sodium 1500mg , cholesterol 200mg , calories 30% from fat and 7% or less from saturated fats, daily to have 5 or more servings of fruits and vegetables.  Biometrics:   Post Biometrics - 09/05/24 1425        Post  Biometrics   Height 5' 8 (1.727 m)    Weight 103.3 kg    Waist Circumference 46 inches    Hip Circumference 43 inches    Waist to Hip Ratio 1.07 %    BMI (Calculated) 34.64    Triceps Skinfold 15 mm    % Body Fat 32.8 %    Grip Strength 21 kg    Flexibility 10 in    Single Leg Stand 4 seconds          Nutrition Therapy Plan and Nutrition Goals:  Nutrition Therapy & Goals - 09/11/24 1615       Nutrition Therapy   Diet Heart Healthy    Drug/Food Interactions Statins/Certain Fruits      Personal Nutrition Goals   Nutrition Goal Patient to identify strategies for reducing cardiovascular risk by attending the [employer] education and nutrition series weekly.    Personal Goal #2 Patient to improve diet quality by using the plate method as a guide for meal  planning to include lean protein/plant protein, fruits, vegetables, whole grains, nonfat dairy as part of a well-balanced diet.    Comments Patient with past medical history significant for coronary disease in 2017 he required stent to the proximal portion of LAD and mid LAD for 90% stenosis, additional problems include HTN, diabetes, dyslipidemia, obstructive sleep apnea. Recently underwent aortic valve replacement, CABG X 2 on 07/02/2024. Pt reports decreased appetite since surgery. Non-significant wt loss of 4% noted over past 2 months. Pt currently receiving  Mounjaro injections which likely contributed to decreased hunger. Pt reports difficulty following plant-based due to food preferences. Avoids fried foods, though diet includes high sodium foods such as ham. RD provided meal suggestions based on foods pt currently enjoys. Reviewed main points of diet using plate model. Patient will benefit from participation in intensive cardiac rehab for nutrition education, exercise, and lifestyle modification.      Intervention Plan   Intervention Prescribe, educate and counsel regarding individualized specific dietary modifications aiming towards targeted core components such as weight, hypertension, lipid management, diabetes, heart failure and other comorbidities.    Expected Outcomes Short Term Goal: Understand basic principles of dietary content, such as calories, fat, sodium, cholesterol and nutrients.;Long Term Goal: Adherence to prescribed nutrition plan.          Nutrition Assessments:  MEDIFICTS Score Key: >=70 Need to make dietary changes  40-70 Heart Healthy Diet <= 40 Therapeutic Level Cholesterol Diet   Flowsheet Row INTENSIVE CARDIAC REHAB from 09/11/2024 in [facility]  Picture Your Plate Total Score on Admission 55   Picture Your Plate Scores: <59 Unhealthy dietary pattern with much room for improvement. 41-50 Dietary pattern unlikely to meet recommendations for good health and room for improvement. 51-60 More healthful dietary pattern, with some room for improvement.  >60 Healthy dietary pattern, although there may be some specific behaviors that could be improved.    Nutrition Goals Re-Evaluation:  Nutrition Goals Re-Evaluation     Row Name 09/18/24 1056 10/16/24 1102           Goals   Current Weight 227 lb 4.7 oz (103.1 kg)  09/16/2024 227 lb 1.2 oz (103 kg)  10/14/2024      Nutrition Goal Patient to identify strategies for reducing cardiovascular risk by attending  the [facility] education and nutrition series weekly. Patient to identify strategies for reducing cardiovascular risk by attending the [facility] education and nutrition series weekly.      Comment -- Stable wt.      Expected Outcome Goals in action. Patient with past medical history significant for coronary disease in 2017 he required stent to the proximal portion of LAD and mid LAD for 90% stenosis, additional problems include HTN, diabetes, dyslipidemia, obstructive sleep apnea. Recently underwent aortic valve replacement, CABG X 2 on 07/02/2024. Pt currently taking mounjaro, farxiga , and metformin ; metformin  dosage recently reduced after pt experienced significant drop in blood glucose after exercise last week. Pt reports eating cinnamon and raisin bagel with cream cheese ~ 2 hours prior to class as well as protein bar on way to class this morning. States blood glucose dropped to 50's during the night; ate barbecue ribs for dinner last night. RD discussed importance of including carbohydrate with protein source ~ 30 minutes prior to exercise to help ensure glucose remains in safe range. Also discussed adding snack such as yogurt with fruit in evening after dinner to help with glycemic management during the night. [facility]  Managing Blood Sugar During Exercise handout provided. RD also reviewed information with spouse. Patient will benefit from participation in intensive cardiac rehab for nutrition education, exercise, and lifestyle modification. Goals in action. Patient with past medical history significant for coronary disease in 2017 he required stent to the proximal portion of LAD and mid LAD for 90% stenosis, additional problems include HTN, diabetes, dyslipidemia, obstructive sleep apnea. Recently underwent aortic valve replacement, CABG X 2 on 07/02/2024. No recent wt change since starting cardiac rehab. Pt reports improvement in blood glucose since ending prescribed steroids on Sunday. Typically eating English  muffin with peanut butter and Raisin Bran with whole milk for breakfast. Reports lack of preference for skim milk. RD encouraged pt to limit saturated fat given impact on blood cholesterol; pt expresses understanding regarding dietary sources of saturated fat. Pt reports eating homemade chili with mixture of ground turkey and 97% lean ground beef as well as kidney beans for dinner last night. Typically may have snack during midday due to lack of appetite. RD encouraged pt to consume snacks containing rich source of protein to help maintain muscle mass. Pt and wife attending education and cooking classes when able. Patient will benefit from ongoing participation in intensive cardiac rehab for nutrition education, exercise, and lifestyle modification.        Personal Goal #2 Re-Evaluation   Personal Goal #2 Patient to improve diet quality by using the plate method as a guide for meal planning to include lean protein/plant protein, fruits, vegetables, whole grains, nonfat dairy as part of a well-balanced diet. Patient to improve diet quality by using the plate method as a guide for meal planning to include lean protein/plant protein, fruits, vegetables, whole grains, nonfat dairy as part of a well-balanced diet.         Nutrition Goals Re-Evaluation:  Nutrition Goals Re-Evaluation     Row Name 09/18/24 1056 10/16/24 1102           Goals   Current Weight 227 lb 4.7 oz (103.1 kg)  09/16/2024 227 lb 1.2 oz (103 kg)  10/14/2024      Nutrition Goal Patient to identify strategies for reducing cardiovascular risk by attending the [facility] education and nutrition series weekly. Patient to identify strategies for reducing cardiovascular risk by attending the [facility] education and nutrition series weekly.      Comment -- Stable wt.      Expected Outcome Goals in action. Patient with past medical history significant for coronary disease in 2017 he required stent to the proximal portion of LAD and mid LAD for  90% stenosis, additional problems include HTN, diabetes, dyslipidemia, obstructive sleep apnea. Recently underwent aortic valve replacement, CABG X 2 on 07/02/2024. Pt currently taking mounjaro, farxiga , and metformin ; metformin  dosage recently reduced after pt experienced significant drop in blood glucose after exercise last week. Pt reports eating cinnamon and raisin bagel with cream cheese ~ 2 hours prior to class as well as protein bar on way to class this morning. States blood glucose dropped to 50's during the night; ate barbecue ribs for dinner last night. RD discussed importance of including carbohydrate with protein source ~ 30 minutes prior to exercise to help ensure glucose remains in safe range. Also discussed adding snack such as yogurt with fruit in evening after dinner to help with glycemic management during the night. [facility] Managing Blood Sugar During Exercise handout provided. RD also reviewed information with spouse. Patient will benefit from participation in intensive cardiac rehab for nutrition education, exercise,  and lifestyle modification. Goals in action. Patient with past medical history significant for coronary disease in 2017 he required stent to the proximal portion of LAD and mid LAD for 90% stenosis, additional problems include HTN, diabetes, dyslipidemia, obstructive sleep apnea. Recently underwent aortic valve replacement, CABG X 2 on 07/02/2024. No recent wt change since starting cardiac rehab. Pt reports improvement in blood glucose since ending prescribed steroids on Sunday. Typically eating English muffin with peanut butter and Raisin Bran with whole milk for breakfast. Reports lack of preference for skim milk. RD encouraged pt to limit saturated fat given impact on blood cholesterol; pt expresses understanding regarding dietary sources of saturated fat. Pt reports eating homemade chili with mixture of ground turkey and 97% lean ground beef as well as kidney beans for dinner last  night. Typically may have snack during midday due to lack of appetite. RD encouraged pt to consume snacks containing rich source of protein to help maintain muscle mass. Pt and wife attending education and cooking classes when able. Patient will benefit from ongoing participation in intensive cardiac rehab for nutrition education, exercise, and lifestyle modification.        Personal Goal #2 Re-Evaluation   Personal Goal #2 Patient to improve diet quality by using the plate method as a guide for meal planning to include lean protein/plant protein, fruits, vegetables, whole grains, nonfat dairy as part of a well-balanced diet. Patient to improve diet quality by using the plate method as a guide for meal planning to include lean protein/plant protein, fruits, vegetables, whole grains, nonfat dairy as part of a well-balanced diet.         Nutrition Goals Discharge (Final Nutrition Goals Re-Evaluation):  Nutrition Goals Re-Evaluation - 10/16/24 1102       Goals   Current Weight 227 lb 1.2 oz (103 kg)   10/14/2024   Nutrition Goal Patient to identify strategies for reducing cardiovascular risk by attending the [facility]in education and nutrition series weekly.    Comment Stable wt.    Expected Outcome Goals in action. Patient with past medical history significant for coronary disease in 2017 he required stent to the proximal portion of LAD and mid LAD for 90% stenosis, additional problems include HTN, diabetes, dyslipidemia, obstructive sleep apnea. Recently underwent aortic valve replacement, CABG X 2 on 07/02/2024. No recent wt change since starting cardiac rehab. Pt reports improvement in blood glucose since ending prescribed steroids on Sunday. Typically eating English muffin with peanut butter and Raisin Bran with whole milk for breakfast. Reports lack of preference for skim milk. RD encouraged pt to limit saturated fat given impact on blood cholesterol; pt expresses understanding regarding dietary sources  of saturated fat. Pt reports eating homemade chili with mixture of ground turkey and 97% lean ground beef as well as kidney beans for dinner last night. Typically may have snack during midday due to lack of appetite. RD encouraged pt to consume snacks containing rich source of protein to help maintain muscle mass. Pt and wife attending education and cooking classes when able. Patient will benefit from ongoing participation in intensive cardiac rehab for nutrition education, exercise, and lifestyle modification.      Personal Goal #2 Re-Evaluation   Personal Goal #2 Patient to improve diet quality by using the plate method as a guide for meal planning to include lean protein/plant protein, fruits, vegetables, whole grains, nonfat dairy as part of a well-balanced diet.          Psychosocial: Target Goals: Acknowledge presence  or absence of significant depression and/or stress, maximize coping skills, provide positive support system. Participant is able to verbalize types and ability to use techniques and skills needed for reducing stress and depression.  Initial Review & Psychosocial Screening:  Initial Psych Review & Screening - 09/05/24 0836       Initial Review   Current issues with Current Sleep Concerns;Current Stress Concerns    Source of Stress Concerns Chronic Illness;Financial;Retirement/disability      Family Dynamics   Good Support System? Yes   Pt has his wife, kids, and neighbors as a part of his support system     Barriers   Psychosocial barriers to participate in program There are no identifiable barriers or psychosocial needs.;The patient should benefit from training in stress management and relaxation.      Screening Interventions   Interventions Encouraged to exercise;Provide feedback about the scores to participant    Expected Outcomes Long Term Goal: Stressors or current issues are controlled or eliminated.;Short Term goal: Identification and review with participant of  any Quality of Life or Depression concerns found by scoring the questionnaire.;Long Term goal: The participant improves quality of Life and PHQ9 Scores as seen by post scores and/or verbalization of changes          Quality of Life Scores:  Quality of Life - 09/05/24 1426       Quality of Life   Select Quality of Life      Quality of Life Scores   Health/Function Pre 23.2 %    Socioeconomic Pre 28.29 %    Psych/Spiritual Pre 25.71 %    Family Pre 26.4 %    GLOBAL Pre 25.24 %         Scores of 19 and below usually indicate a poorer quality of life in these areas.  A difference of  2-3 points is a clinically meaningful difference.  A difference of 2-3 points in the total score of the Quality of Life Index has been associated with significant improvement in overall quality of life, self-image, physical symptoms, and general health in studies assessing change in quality of life.  PHQ-9: Review Flowsheet       09/05/2024 05/26/2017 04/12/2017  Depression screen PHQ 2/9  Decreased Interest 0 0 0  Down, Depressed, Hopeless 0 0 0  PHQ - 2 Score 0 0 0  Altered sleeping 1 - -  Tired, decreased energy 1 - -  Change in appetite 1 - -  Feeling bad or failure about yourself  0 - -  Trouble concentrating 0 - -  Moving slowly or fidgety/restless 0 - -  Suicidal thoughts 0 - -  PHQ-9 Score 3 - -  Difficult doing work/chores Not difficult at all - -   Interpretation of Total Score  Total Score Depression Severity:  1-4 = Minimal depression, 5-9 = Mild depression, 10-14 = Moderate depression, 15-19 = Moderately severe depression, 20-27 = Severe depression   Psychosocial Evaluation and Intervention:   Psychosocial Re-Evaluation:  Psychosocial Re-Evaluation     Row Name 09/12/24 9076 09/23/24 1109 10/22/24 0929         Psychosocial Re-Evaluation   Current issues with Current Sleep Concerns;Current Stress Concerns Current Sleep Concerns;Current Stress Concerns None Identified      Comments Larell started cardiac rehab on 09/09/24. Nykeem did voice concerns regarding his blood pressures as his cardiologist wants to put him on another medication. Will review PHQ9 in the upcoming future PHQ9 reviewed. Olin reports having back pain  which has improved. Jenney says his energy level has improved since he has been participating in cardiac rehab. Owyn says that he sometimes does not feel like eating as he is on monjaro. Ivon has not voiced any increased concerns or stressor during exercise at cardiac rehab.     Expected Outcomes Pardeep will have controlled or decreased stress upon completion of cardiac rehab Eirik will have controlled or decreased stress upon completion of cardiac rehab Duwan will have controlled or decreased stress upon completion of cardiac rehab     Interventions Relaxation education;Encouraged to attend Cardiac Rehabilitation for the exercise;Stress management education Relaxation education;Encouraged to attend Cardiac Rehabilitation for the exercise;Stress management education Relaxation education;Encouraged to attend Cardiac Rehabilitation for the exercise;Stress management education     Continue Psychosocial Services  -- -- No Follow up required       Initial Review   Source of Stress Concerns Retirement/disability;Financial;Chronic Illness Retirement/disability;Financial;Chronic Illness --     Comments Will continue to monitor and offer support as needed Will continue to monitor and offer support as needed --        Psychosocial Discharge (Final Psychosocial Re-Evaluation):  Psychosocial Re-Evaluation - 10/22/24 0929       Psychosocial Re-Evaluation   Current issues with None Identified    Comments Pistol has not voiced any increased concerns or stressor during exercise at cardiac rehab.    Expected Outcomes [place] will have controlled or decreased stress upon completion of cardiac rehab    Interventions Relaxation education;Encouraged to attend  Cardiac Rehabilitation for the exercise;Stress management education    Continue Psychosocial Services  No Follow up required          Vocational Rehabilitation: Provide vocational rehab assistance to qualifying candidates.   Vocational Rehab Evaluation & Intervention:  Vocational Rehab - 09/05/24 1427       Initial Vocational Rehab Evaluation & Intervention   Assessment shows need for Vocational Rehabilitation No   No needs at this time         Education: Education Goals: Education classes will be provided on a weekly basis, covering required topics. Participant will state understanding/return demonstration of topics presented.    Education     Row Name 09/09/24 0700     Education   Cardiac Education Topics [facility]in   Psychologist, Forensic Exercise Education   Exercise Education Improving Performance   Instruction Review Code 1- Verbalizes Understanding   Class Start Time 0820   Class Stop Time 0855   Class Time Calculation (min) 35 min    Row Name 09/11/24 1000     Education   Cardiac Education Topics [facility]   Customer Service Manager   Weekly Topic International Cuisine- Spotlight on the [employer] Zones   Instruction Review Code 1- Verbalizes Understanding   Class Start Time 1145   Class Stop Time 1224   Class Time Calculation (min) 39 min    Row Name 09/13/24 1200     Education   Cardiac Education Topics [facility]   Select Workshops     Core Videos   Educator --   Select --   Nutrition --   Instruction Review Code --   Class Start Time --   Class Stop Time --   Class Time Calculation (min) --     Workshops   Geophysical Data Processor Nutrition   Nutrition  Workshop Fueling a Fish Farm Manager Review Code 1- Verbalizes Understanding   Class Start Time 1150   Class Stop Time 1229   Class Time Calculation (min) 39 min    Row Name 09/18/24  1000     Education   Cardiac Education Topics [facility]ikin   Customer Service Manager   Weekly Topic Simple Sides and Sauces   Instruction Review Code 1- Verbalizes Understanding   Class Start Time 1146   Class Stop Time 1221   Class Time Calculation (min) 35 min    Row Name 09/27/24 1000     Education   Cardiac Education Topics [facility]ikin   Select Core Videos     Core Videos   Educator Dietitian   Select Nutrition   Nutrition Facts on Fat   Instruction Review Code 1- Verbalizes Understanding   Class Start Time 1145   Class Stop Time 1219   Class Time Calculation (min) 34 min    Row Name 09/30/24 1100     Education   Cardiac Education Topics [facility]   Geographical Information Systems Officer Psychosocial   Psychosocial Workshop From Head to Heart: The Power of a Healthy Outlook   Instruction Review Code 1- Verbalizes Understanding   Class Start Time 1145   Class Stop Time 1229   Class Time Calculation (min) 44 min    Row Name 10/02/24 1000     Education   Cardiac Education Topics [facility]   Secondary School Teacher School   Educator Dietitian   Weekly Topic Tasty Appetizers and Snacks   Instruction Review Code 1- Verbalizes Understanding   Class Start Time 1145   Class Stop Time 1225   Class Time Calculation (min) 40 min    Row Name 10/11/24 1000     Education   Cardiac Education Topics --   Select --     Core Videos   Educator --   Select --   Psychosocial --   Instruction Review Code --    Row Name 10/14/24 1100     Education   Cardiac Education Topics --   Select --     Core Videos   Educator --   Select --   General Education --   Instruction Review Code --    Row Name 10/16/24 1100     Education   Cardiac Education Topics [facility]in   Customer Service Manager   Weekly Topic Fast and Healthy Breakfasts   Instruction  Review Code 1- Verbalizes Understanding   Class Start Time 1145   Class Stop Time 1224   Class Time Calculation (min) 39 min    Row Name 10/18/24 1000     Education   Cardiac Education Topics [facility]in   Select Workshops     Workshops   Educator Exercise Physiologist   Select Exercise   Exercise Workshop Location Manager and Fall Prevention   Instruction Review Code 1- Verbalizes Understanding   Class Start Time 1150   Class Stop Time 1235   Class Time Calculation (min) 45 min      Core Videos: Exercise    Move It!  Clinical staff conducted group or individual video education with verbal and written material and guidebook.  Patient learns the recommended [facility]in exercise program. Exercise with the goal of living  a long, healthy life. Some of the health benefits of exercise include controlled diabetes, healthier blood pressure levels, improved cholesterol levels, improved heart and lung capacity, improved sleep, and better body composition. Everyone should speak with their doctor before starting or changing an exercise routine.  Biomechanical Limitations Clinical staff conducted group or individual video education with verbal and written material and guidebook.  Patient learns how biomechanical limitations can impact exercise and how we can mitigate and possibly overcome limitations to have an impactful and balanced exercise routine.  Body Composition Clinical staff conducted group or individual video education with verbal and written material and guidebook.  Patient learns that body composition (ratio of muscle mass to fat mass) is a key component to assessing overall fitness, rather than body weight alone. Increased fat mass, especially visceral belly fat, can put us  at increased risk for metabolic syndrome, type 2 diabetes, heart disease, and even death. It is recommended to combine diet and exercise (cardiovascular and resistance training) to improve your body composition. Seek  guidance from your physician and exercise physiologist before implementing an exercise routine.  Exercise Action Plan Clinical staff conducted group or individual video education with verbal and written material and guidebook.  Patient learns the recommended strategies to achieve and enjoy long-term exercise adherence, including variety, self-motivation, self-efficacy, and positive decision making. Benefits of exercise include fitness, good health, weight management, more energy, better sleep, less stress, and overall well-being.  Medical   Heart Disease Risk Reduction Clinical staff conducted group or individual video education with verbal and written material and guidebook.  Patient learns our heart is our most vital organ as it circulates oxygen, nutrients, white blood cells, and hormones throughout the entire body, and carries waste away. Data supports a plant-based eating plan like the Pritikin Program for its effectiveness in slowing progression of and reversing heart disease. The video provides a number of recommendations to address heart disease.   Metabolic Syndrome and Belly Fat  Clinical staff conducted group or individual video education with verbal and written material and guidebook.  Patient learns what metabolic syndrome is, how it leads to heart disease, and how one can reverse it and keep it from coming back. You have metabolic syndrome if you have 3 of the following 5 criteria: abdominal obesity, high blood pressure, high triglycerides, low HDL cholesterol, and high blood sugar.  Hypertension and Heart Disease Clinical staff conducted group or individual video education with verbal and written material and guidebook.  Patient learns that high blood pressure, or hypertension, is very common in the [place] . Hypertension is largely due to excessive salt intake, but other important risk factors include being overweight, physical inactivity, drinking too much alcohol, smoking, and  not eating enough potassium from fruits and vegetables. High blood pressure is a leading risk factor for heart attack, stroke, congestive heart failure, dementia, kidney failure, and premature death. Long-term effects of excessive salt intake include stiffening of the arteries and thickening of heart muscle and organ damage. Recommendations include ways to reduce hypertension and the risk of heart disease.  Diseases of Our Time - Focusing on Diabetes Clinical staff conducted group or individual video education with verbal and written material and guidebook.  Patient learns why the best way to stop diseases of our time is prevention, through food and other lifestyle changes. Medicine (such as prescription pills and surgeries) is often only a Band-Aid on the problem, not a long-term solution. Most common diseases of our time include obesity, type 2 diabetes,  hypertension, heart disease, and cancer. The Pritikin Program is recommended and has been proven to help reduce, reverse, and/or prevent the damaging effects of metabolic syndrome.  Nutrition   Overview of the Pritikin Eating Plan  Clinical staff conducted group or individual video education with verbal and written material and guidebook.  Patient learns about the Pritikin Eating Plan for disease risk reduction. The Pritikin Eating Plan emphasizes a wide variety of unrefined, minimally-processed carbohydrates, like fruits, vegetables, whole grains, and legumes. Go, Caution, and Stop food choices are explained. Plant-based and lean animal proteins are emphasized. Rationale provided for low sodium intake for blood pressure control, low added sugars for blood sugar stabilization, and low added fats and oils for coronary artery disease risk reduction and weight management.  Calorie Density  Clinical staff conducted group or individual video education with verbal and written material and guidebook.  Patient learns about calorie density and how it impacts  the Pritikin Eating Plan. Knowing the characteristics of the food you choose will help you decide whether those foods will lead to weight gain or weight loss, and whether you want to consume more or less of them. Weight loss is usually a side effect of the Pritikin Eating Plan because of its focus on low calorie-dense foods.  Label Reading  Clinical staff conducted group or individual video education with verbal and written material and guidebook.  Patient learns about the Pritikin recommended label reading guidelines and corresponding recommendations regarding calorie density, added sugars, sodium content, and whole grains.  Dining Out - Part 1  Clinical staff conducted group or individual video education with verbal and written material and guidebook.  Patient learns that restaurant meals can be sabotaging because they can be so high in calories, fat, sodium, and/or sugar. Patient learns recommended strategies on how to positively address this and avoid unhealthy pitfalls.  Facts on Fats  Clinical staff conducted group or individual video education with verbal and written material and guidebook.  Patient learns that lifestyle modifications can be just as effective, if not more so, as many medications for lowering your risk of heart disease. A Pritikin lifestyle can help to reduce your risk of inflammation and atherosclerosis (cholesterol build-up, or plaque, in the artery walls). Lifestyle interventions such as dietary choices and physical activity address the cause of atherosclerosis. A review of the types of fats and their impact on blood cholesterol levels, along with dietary recommendations to reduce fat intake is also included.  Nutrition Action Plan  Clinical staff conducted group or individual video education with verbal and written material and guidebook.  Patient learns how to incorporate Pritikin recommendations into their lifestyle. Recommendations include planning and keeping personal  health goals in mind as an important part of their success.  Healthy Mind-Set    Healthy Minds, Bodies, Hearts  Clinical staff conducted group or individual video education with verbal and written material and guidebook.  Patient learns how to identify when they are stressed. Video will discuss the impact of that stress, as well as the many benefits of stress management. Patient will also be introduced to stress management techniques. The way we think, act, and feel has an impact on our hearts.  How Our Thoughts Can Heal Our Hearts  Clinical staff conducted group or individual video education with verbal and written material and guidebook.  Patient learns that negative thoughts can cause depression and anxiety. This can result in negative lifestyle behavior and serious health problems. Cognitive behavioral therapy is an effective method to  help control our thoughts in order to change and improve our emotional outlook.  Additional Videos:  Exercise    Improving Performance  Clinical staff conducted group or individual video education with verbal and written material and guidebook.  Patient learns to use a non-linear approach by alternating intensity levels and lengths of time spent exercising to help burn more calories and lose more body fat. Cardiovascular exercise helps improve heart health, metabolism, hormonal balance, blood sugar control, and recovery from fatigue. Resistance training improves strength, endurance, balance, coordination, reaction time, metabolism, and muscle mass. Flexibility exercise improves circulation, posture, and balance. Seek guidance from your physician and exercise physiologist before implementing an exercise routine and learn your capabilities and proper form for all exercise.  Introduction to Yoga  Clinical staff conducted group or individual video education with verbal and written material and guidebook.  Patient learns about yoga, a discipline of the coming  together of mind, breath, and body. The benefits of yoga include improved flexibility, improved range of motion, better posture and core strength, increased lung function, weight loss, and positive self-image. Yogas heart health benefits include lowered blood pressure, healthier heart rate, decreased cholesterol and triglyceride levels, improved immune function, and reduced stress. Seek guidance from your physician and exercise physiologist before implementing an exercise routine and learn your capabilities and proper form for all exercise.  Medical   Aging: Enhancing Your Quality of Life  Clinical staff conducted group or individual video education with verbal and written material and guidebook.  Patient learns key strategies and recommendations to stay in good physical health and enhance quality of life, such as prevention strategies, having an advocate, securing a Health Care Proxy and Power of Attorney, and keeping a list of medications and system for tracking them. It also discusses how to avoid risk for bone loss.  Biology of Weight Control  Clinical staff conducted group or individual video education with verbal and written material and guidebook.  Patient learns that weight gain occurs because we consume more calories than we burn (eating more, moving less). Even if your body weight is normal, you may have higher ratios of fat compared to muscle mass. Too much body fat puts you at increased risk for cardiovascular disease, heart attack, stroke, type 2 diabetes, and obesity-related cancers. In addition to exercise, following the Pritikin Eating Plan can help reduce your risk.  Decoding Lab Results  Clinical staff conducted group or individual video education with verbal and written material and guidebook.  Patient learns that lab test reflects one measurement whose values change over time and are influenced by many factors, including medication, stress, sleep, exercise, food, hydration,  pre-existing medical conditions, and more. It is recommended to use the knowledge from this video to become more involved with your lab results and evaluate your numbers to speak with your doctor.   Diseases of Our Time - Overview  Clinical staff conducted group or individual video education with verbal and written material and guidebook.  Patient learns that according to the CDC, 50% to 70% of chronic diseases (such as obesity, type 2 diabetes, elevated lipids, hypertension, and heart disease) are avoidable through lifestyle improvements including healthier food choices, listening to satiety cues, and increased physical activity.  Sleep Disorders Clinical staff conducted group or individual video education with verbal and written material and guidebook.  Patient learns how good quality and duration of sleep are important to overall health and well-being. Patient also learns about sleep disorders and how they impact health along  with recommendations to address them, including discussing with a physician.  Nutrition  Dining Out - Part 2 Clinical staff conducted group or individual video education with verbal and written material and guidebook.  Patient learns how to plan ahead and communicate in order to maximize their dining experience in a healthy and nutritious manner. Included are recommended food choices based on the type of restaurant the patient is visiting.   Fueling a Banker conducted group or individual video education with verbal and written material and guidebook.  There is a strong connection between our food choices and our health. Diseases like obesity and type 2 diabetes are very prevalent and are in large-part due to lifestyle choices. The P[employer]ikin Eating Plan provides plenty of food and hunger-curbing satisfaction. It is easy to follow, affordable, and helps reduce health risks.  Menu Workshop  Clinical staff conducted group or individual video education  with verbal and written material and guidebook.  Patient learns that restaurant meals can sabotage health goals because they are often packed with calories, fat, sodium, and sugar. Recommendations include strategies to plan ahead and to communicate with the manager, chef, or server to help order a healthier meal.  Planning Your Eating Strategy  Clinical staff conducted group or individual video education with verbal and written material and guidebook.  Patient learns about the Pritikin Eating Plan and its benefit of reducing the risk of disease. The Pritikin Eating Plan does not focus on calories. Instead, it emphasizes high-quality, nutrient-rich foods. By knowing the characteristics of the foods, we choose, we can determine their calorie density and make informed decisions.  Targeting Your Nutrition Priorities  Clinical staff conducted group or individual video education with verbal and written material and guidebook.  Patient learns that lifestyle habits have a tremendous impact on disease risk and progression. This video provides eating and physical activity recommendations based on your personal health goals, such as reducing LDL cholesterol, losing weight, preventing or controlling type 2 diabetes, and reducing high blood pressure.  Vitamins and Minerals  Clinical staff conducted group or individual video education with verbal and written material and guidebook.  Patient learns different ways to obtain key vitamins and minerals, including through a recommended healthy diet. It is important to discuss all supplements you take with your doctor.   Healthy Mind-Set    Smoking Cessation  Clinical staff conducted group or individual video education with verbal and written material and guidebook.  Patient learns that cigarette smoking and tobacco addiction pose a serious health risk which affects millions of people. Stopping smoking will significantly reduce the risk of heart disease, lung disease,  and many forms of cancer. Recommended strategies for quitting are covered, including working with your doctor to develop a successful plan.  Culinary   Becoming a Set Designer conducted group or individual video education with verbal and written material and guidebook.  Patient learns that cooking at home can be healthy, cost-effective, quick, and puts them in control. Keys to cooking healthy recipes will include looking at your recipe, assessing your equipment needs, planning ahead, making it simple, choosing cost-effective seasonal ingredients, and limiting the use of added fats, salts, and sugars.  Cooking - Breakfast and Snacks  Clinical staff conducted group or individual video education with verbal and written material and guidebook.  Patient learns how important breakfast is to satiety and nutrition through the entire day. Recommendations include key foods to eat during breakfast to help stabilize blood sugar levels  and to prevent overeating at meals later in the day. Planning ahead is also a key component.  Cooking - Educational Psychologist conducted group or individual video education with verbal and written material and guidebook.  Patient learns eating strategies to improve overall health, including an approach to cook more at home. Recommendations include thinking of animal protein as a side on your plate rather than center stage and focusing instead on lower calorie dense options like vegetables, fruits, whole grains, and plant-based proteins, such as beans. Making sauces in large quantities to freeze for later and leaving the skin on your vegetables are also recommended to maximize your experience.  Cooking - Healthy Salads and Dressing Clinical staff conducted group or individual video education with verbal and written material and guidebook.  Patient learns that vegetables, fruits, whole grains, and legumes are the foundations of the Pritikin Eating Plan.  Recommendations include how to incorporate each of these in flavorful and healthy salads, and how to create homemade salad dressings. Proper handling of ingredients is also covered. Cooking - Soups and [employer] - Soups and Desserts Clinical staff conducted group or individual video education with verbal and written material and guidebook.  Patient learns that Pritikin soups and desserts make for easy, nutritious, and delicious snacks and meal components that are low in sodium, fat, sugar, and calorie density, while high in vitamins, minerals, and filling fiber. Recommendations include simple and healthy ideas for soups and desserts.   Overview     The Pritikin Solution Program Overview Clinical staff conducted group or individual video education with verbal and written material and guidebook.  Patient learns that the results of the Pritikin Program have been documented in more than 100 articles published in peer-reviewed journals, and the benefits include reducing risk factors for (and, in some cases, even reversing) high cholesterol, high blood pressure, type 2 diabetes, obesity, and more! An overview of the three key pillars of the Pritikin Program will be covered: eating well, doing regular exercise, and having a healthy mind-set.  WORKSHOPS  Exercise: Exercise Basics: Building Your Action Plan Clinical staff led group instruction and group discussion with PowerPoint presentation and patient guidebook. To enhance the learning environment the use of posters, models and videos may be added. At the conclusion of this workshop, patients will comprehend the difference between physical activity and exercise, as well as the benefits of incorporating both, into their routine. Patients will understand the FITT (Frequency, Intensity, Time, and Type) principle and how to use it to build an exercise action plan. In addition, safety concerns and other considerations for exercise and cardiac rehab  will be addressed by the presenter. The purpose of this lesson is to promote a comprehensive and effective weekly exercise routine in order to improve patients overall level of fitness.   Managing Heart Disease: Your Path to a Healthier Heart Clinical staff led group instruction and group discussion with PowerPoint presentation and patient guidebook. To enhance the learning environment the use of posters, models and videos may be added.At the conclusion of this workshop, patients will understand the anatomy and physiology of the heart. Additionally, they will understand how Pritikins three pillars impact the risk factors, the progression, and the management of heart disease.  The purpose of this lesson is to provide a high-level overview of the heart, heart disease, and how the Pritikin lifestyle positively impacts risk factors.  Exercise Biomechanics Clinical staff led group instruction and group discussion with PowerPoint presentation and patient  guidebook. To enhance the learning environment the use of posters, models and videos may be added. Patients will learn how the structural parts of their bodies function and how these functions impact their daily activities, movement, and exercise. Patients will learn how to promote a neutral spine, learn how to manage pain, and identify ways to improve their physical movement in order to promote healthy living. The purpose of this lesson is to expose patients to common physical limitations that impact physical activity. Participants will learn practical ways to adapt and manage aches and pains, and to minimize their effect on regular exercise. Patients will learn how to maintain good posture while sitting, walking, and lifting.  Balance Training and Fall Prevention  Clinical staff led group instruction and group discussion with PowerPoint presentation and patient guidebook. To enhance the learning environment the use of posters, models and videos  may be added. At the conclusion of this workshop, patients will understand the importance of their sensorimotor skills (vision, proprioception, and the vestibular system) in maintaining their ability to balance as they age. Patients will apply a variety of balancing exercises that are appropriate for their current level of function. Patients will understand the common causes for poor balance, possible solutions to these problems, and ways to modify their physical environment in order to minimize their fall risk. The purpose of this lesson is to teach patients about the importance of maintaining balance as they age and ways to minimize their risk of falling.  WORKSHOPS   Nutrition:  Fueling a Ship Broker led group instruction and group discussion with PowerPoint presentation and patient guidebook. To enhance the learning environment the use of posters, models and videos may be added. Patients will review the foundational principles of the Pritikin Eating Plan and understand what constitutes a serving size in each of the food groups. Patients will also learn Pritikin-friendly foods that are better choices when away from home and review make-ahead meal and snack options. Calorie density will be reviewed and applied to three nutrition priorities: weight maintenance, weight loss, and weight gain. The purpose of this lesson is to reinforce (in a group setting) the key concepts around what patients are recommended to eat and how to apply these guidelines when away from home by planning and selecting Pritikin-friendly options. Patients will understand how calorie density may be adjusted for different weight management goals.  Mindful Eating  Clinical staff led group instruction and group discussion with PowerPoint presentation and patient guidebook. To enhance the learning environment the use of posters, models and videos may be added. Patients will briefly review the concepts of the Pritikin  Eating Plan and the importance of low-calorie dense foods. The concept of mindful eating will be introduced as well as the importance of paying attention to internal hunger signals. Triggers for non-hunger eating and techniques for dealing with triggers will be explored. The purpose of this lesson is to provide patients with the opportunity to review the basic principles of the Pritikin Eating Plan, discuss the value of eating mindfully and how to measure internal cues of hunger and fullness using the Hunger Scale. Patients will also discuss reasons for non-hunger eating and learn strategies to use for controlling emotional eating.  Targeting Your Nutrition Priorities Clinical staff led group instruction and group discussion with PowerPoint presentation and patient guidebook. To enhance the learning environment the use of posters, models and videos may be added. Patients will learn how to determine their genetic susceptibility to disease by reviewing their  family history. Patients will gain insight into the importance of diet as part of an overall healthy lifestyle in mitigating the impact of genetics and other environmental insults. The purpose of this lesson is to provide patients with the opportunity to assess their personal nutrition priorities by looking at their family history, their own health history and current risk factors. Patients will also be able to discuss ways of prioritizing and modifying the Pritikin Eating Plan for their highest risk areas  Menu  Clinical staff led group instruction and group discussion with PowerPoint presentation and patient guidebook. To enhance the learning environment the use of posters, models and videos may be added. Using menus brought in from [employer], or printed from [employer], patients will apply the Pritikin dining out guidelines that were presented in the [employer] video. Patients will also be able to practice these guidelines  in a variety of provided scenarios. The purpose of this lesson is to provide patients with the opportunity to practice hands-on learning of the Pritikin Dining Out guidelines with actual menus and practice scenarios.  Label Reading Clinical staff led group instruction and group discussion with PowerPoint presentation and patient guidebook. To enhance the learning environment the use of posters, models and videos may be added. Patients will review and discuss the Pritikin label reading guidelines presented in Pritikins Label Reading Educational series video. Using fool labels brought in from local grocery stores and markets, patients will apply the label reading guidelines and determine if the packaged food meet the Pritikin guidelines. The purpose of this lesson is to provide patients with the opportunity to review, discuss, and practice hands-on learning of the Pritikin Label Reading guidelines with actual packaged food labels. Cooking School  Pritikins [employer] are designed to teach patients ways to prepare quick, simple, and affordable recipes at home. The importance of nutritions role in chronic disease risk reduction is reflected in its emphasis in the overall Pritikin program. By learning how to prepare essential core Pritikin Eating Plan recipes, patients will increase control over what they eat; be able to customize the flavor of foods without the use of added salt, sugar, or fat; and improve the quality of the food they consume. By learning a set of core recipes which are easily assembled, quickly prepared, and affordable, patients are more likely to prepare more healthy foods at home. These workshops focus on convenient breakfasts, simple entres, side dishes, and desserts which can be prepared with minimal effort and are consistent with nutrition recommendations for cardiovascular risk reduction. Cooking [employer] are taught by a armed forces logistics/support/administrative officer (RD) who has  been trained by the [employer]. The chef or RD has a clear understanding of the importance of minimizing - if not completely eliminating - added fat, sugar, and sodium in recipes. Throughout the series of Cooking School Workshop sessions, patients will learn about healthy ingredients and efficient methods of cooking to build confidence in their capability to prepare    Cooking School weekly topics:  Adding Flavor- Sodium-Free  Fast and Healthy Breakfasts  Powerhouse Plant-Based Proteins  Satisfying Salads and Dressings  Simple Sides and Sauces  International Cuisine-Spotlight on the [employer] Zones  Delicious Desserts  Savory Soups  [employer] - Meals in a Astronomer Appetizers and Snacks  Comforting Weekend Breakfasts  One-Pot Wonders   Fast Evening Meals  Landscape Architect Your Pritikin Plate  WORKSHOPS   Healthy Mindset (Psychosocial):  Focused Goals, Sustainable  Changes Clinical staff led group instruction and group discussion with PowerPoint presentation and patient guidebook. To enhance the learning environment the use of posters, models and videos may be added. Patients will be able to apply effective goal setting strategies to establish at least one personal goal, and then take consistent, meaningful action toward that goal. They will learn to identify common barriers to achieving personal goals and develop strategies to overcome them. Patients will also gain an understanding of how our mind-set can impact our ability to achieve goals and the importance of cultivating a positive and growth-oriented mind-set. The purpose of this lesson is to provide patients with a deeper understanding of how to set and achieve personal goals, as well as the tools and strategies needed to overcome common obstacles which may arise along the way.  From Head to Heart: The Power of a Healthy Outlook  Clinical staff led group instruction and group discussion with  PowerPoint presentation and patient guidebook. To enhance the learning environment the use of posters, models and videos may be added. Patients will be able to recognize and describe the impact of emotions and mood on physical health. They will discover the importance of self-care and explore self-care practices which may work for them. Patients will also learn how to utilize the 4 Cs to cultivate a healthier outlook and better manage stress and challenges. The purpose of this lesson is to demonstrate to patients how a healthy outlook is an essential part of maintaining good health, especially as they continue their cardiac rehab journey.  Healthy Sleep for a Healthy Heart Clinical staff led group instruction and group discussion with PowerPoint presentation and patient guidebook. To enhance the learning environment the use of posters, models and videos may be added. At the conclusion of this workshop, patients will be able to demonstrate knowledge of the importance of sleep to overall health, well-being, and quality of life. They will understand the symptoms of, and treatments for, common sleep disorders. Patients will also be able to identify daytime and nighttime behaviors which impact sleep, and they will be able to apply these tools to help manage sleep-related challenges. The purpose of this lesson is to provide patients with a general overview of sleep and outline the importance of quality sleep. Patients will learn about a few of the most common sleep disorders. Patients will also be introduced to the concept of sleep hygiene, and discover ways to self-manage certain sleeping problems through simple daily behavior changes. Finally, the workshop will motivate patients by clarifying the links between quality sleep and their goals of heart-healthy living.   Recognizing and Reducing Stress Clinical staff led group instruction and group discussion with PowerPoint presentation and patient guidebook. To  enhance the learning environment the use of posters, models and videos may be added. At the conclusion of this workshop, patients will be able to understand the types of stress reactions, differentiate between acute and chronic stress, and recognize the impact that chronic stress has on their health. They will also be able to apply different coping mechanisms, such as reframing negative self-talk. Patients will have the opportunity to practice a variety of stress management techniques, such as deep abdominal breathing, progressive muscle relaxation, and/or guided imagery.  The purpose of this lesson is to educate patients on the role of stress in their lives and to provide healthy techniques for coping with it.  Learning Barriers/Preferences:  Learning Barriers/Preferences - 09/05/24 1153       Learning Barriers/Preferences   Learning  Barriers None    Learning Preferences Skilled Demonstration;Pictoral;Written Material;Group Instruction;Audio;Computer/Internet;Verbal Instruction;Video;Individual Instruction          Education Topics:  Knowledge Questionnaire Score:  Knowledge Questionnaire Score - 09/05/24 1426       Knowledge Questionnaire Score   Pre Score 23/24          Core Components/Risk Factors/Patient Goals at Admission:  Personal Goals and Risk Factors at Admission - 09/05/24 1428       Core Components/Risk Factors/Patient Goals on Admission    Weight Management Yes;Obesity;Weight Loss    Intervention Weight Management: Develop a combined nutrition and exercise program designed to reach desired caloric intake, while maintaining appropriate intake of nutrient and fiber, sodium and fats, and appropriate energy expenditure required for the weight goal.;Weight Management: Provide education and appropriate resources to help participant work on and attain dietary goals.;Weight Management/Obesity: Establish reasonable short term and long term weight goals.;Obesity: Provide  education and appropriate resources to help participant work on and attain dietary goals.    Admit Weight 227 lb 11.8 oz (103.3 kg)    Expected Outcomes Short Term: Continue to assess and modify interventions until short term weight is achieved;Long Term: Adherence to nutrition and physical activity/exercise program aimed toward attainment of established weight goal;Weight Maintenance: Understanding of the daily nutrition guidelines, which includes 25-35% calories from fat, 7% or less cal from saturated fats, less than 200mg  cholesterol, less than 1.5gm of sodium, & 5 or more servings of fruits and vegetables daily;Weight Loss: Understanding of general recommendations for a balanced deficit meal plan, which promotes 1-2 lb weight loss per week and includes a negative energy balance of [phone] kcal/d;Understanding recommendations for meals to include 15-35% energy as protein, 25-35% energy from fat, 35-60% energy from carbohydrates, less than 200mg  of dietary cholesterol, 20-35 gm of total fiber daily;Understanding of distribution of calorie intake throughout the day with the consumption of 4-5 meals/snacks    Diabetes Yes    Intervention Provide education about signs/symptoms and action to take for hypo/hyperglycemia.;Provide education about proper nutrition, including hydration, and aerobic/resistive exercise prescription along with prescribed medications to achieve blood glucose in normal ranges: Fasting glucose 65-99 mg/dL    Expected Outcomes Short Term: Participant verbalizes understanding of the signs/symptoms and immediate care of hyper/hypoglycemia, proper foot care and importance of medication, aerobic/resistive exercise and nutrition plan for blood glucose control.;Long Term: Attainment of HbA1C < 7%.    Hypertension Yes    Intervention Provide education on lifestyle modifcations including regular physical activity/exercise, weight management, moderate sodium restriction and increased consumption  of fresh fruit, vegetables, and low fat dairy, alcohol moderation, and smoking cessation.;Monitor prescription use compliance.    Expected Outcomes Short Term: Continued assessment and intervention until BP is < 140/56mm HG in hypertensive participants. < 130/49mm HG in hypertensive participants with diabetes, heart failure or chronic kidney disease.;Long Term: Maintenance of blood pressure at goal levels.    Lipids Yes    Intervention Provide education and support for participant on nutrition & aerobic/resistive exercise along with prescribed medications to achieve LDL 70mg , HDL >40mg .    Expected Outcomes Short Term: Participant states understanding of desired cholesterol values and is compliant with medications prescribed. Participant is following exercise prescription and nutrition guidelines.;Long Term: Cholesterol controlled with medications as prescribed, with individualized exercise RX and with personalized nutrition plan. Value goals: LDL < 70mg , HDL > 40 mg.    Personal Goal Other Yes    Personal Goal Continues to have poor appetite, balance issues, increase  stamina (currently tired after ADL's like showering)    Intervention Will continue to monitor pt and progress worklaods as tolerated without sign or symptom    Expected Outcomes Pt will achieve his goals          Core Components/Risk Factors/Patient Goals Review:   Goals and Risk Factor Review     Row Name 09/12/24 0936 09/23/24 1113 10/22/24 0930         Core Components/Risk Factors/Patient Goals Review   Personal Goals Review Weight Management/Obesity;Diabetes;Hypertension;Lipids;Stress Weight Management/Obesity;Diabetes;Hypertension;Lipids;Stress Weight Management/Obesity;Diabetes;Hypertension;Lipids;Stress     Review Milan started cardiac rehab on 09/09/24. Quavon is off to a good start to exercise. Vital signs were stable. CBG's dropped into the 70's on 09/11/24. Asymptomatic. Patient's PCP notified about CBG's . Sysytolic  BP's noted in upper 09'd to low 100's. Will continue to monitor BP. SABRALaquentin is off to a good start to exercise. Vital signs were stable. CBG's have been variable with some low readings post exercise. metformin  has been placed on hold. Will continue to monitor CBG's .Mekai continues to do well with exercise at cardiac rheab. . Vital signs remain stable. CBG's have been variable. Zebulan has increased his workloads.     Expected Outcomes Gleason will continue to participate in cardiac rehab for exercise, nutriition and lifestyle modifications. Dearius will continue to participate in cardiac rehab for exercise, nutriition and lifestyle modifications. Khai will continue to participate in cardiac rehab for exercise, nutriition and lifestyle modifications.        Core Components/Risk Factors/Patient Goals at Discharge (Final Review):   Goals and Risk Factor Review - 10/22/24 0930       Core Components/Risk Factors/Patient Goals Review   Personal Goals Review Weight Management/Obesity;Diabetes;Hypertension;Lipids;Stress    Review .Jill continues to do well with exercise at cardiac rheab. . Vital signs remain stable. CBG's have been variable. Izmael has increased his workloads.    Expected Outcomes Kelan will continue to participate in cardiac rehab for exercise, nutriition and lifestyle modifications.          ITP Comments:  ITP Comments     Row Name 09/05/24 9164 09/09/24 1624 09/23/24 1100 10/22/24 0928     ITP Comments Wilbert Bihari, MD: Medical Director. Introduction to the [facility]/Intensive Cardiac Rehab. Inital orientation packet reviewed with the patient. 30 Day ITP Review. Isaish started cardiac rehab today (08/12/24). Dezmon is off to a good start with exercise. 30 Day ITP Review. Karlo has good attendance and participation with exercise at  cardiac rehab 30 Day ITP Review. Addison continues to have  good attendance and participation with exercise at  cardiac rehab        Comments: See ITP Comments    [1]  Current Outpatient Medications:    acetaminophen  (TYLENOL ) 325 MG tablet, Take 2 tablets (650 mg total) by mouth every 6 (six) hours as needed for mild pain (pain score 1-3)., Disp: , Rfl:    aspirin  EC 325 MG tablet, Take 1 tablet (325 mg total) by mouth daily., Disp: , Rfl:    atorvastatin  (LIPITOR) 40 MG tablet, Take 40 mg by mouth daily., Disp: , Rfl:    cetirizine (ZYRTEC) 10 MG tablet, Take 10 mg by mouth daily., Disp: , Rfl:    diphenhydrAMINE  (BENADRYL ) 25 mg capsule, Take 1 capsule (25 mg total) by mouth every 8 (eight) hours as needed for itching., Disp: , Rfl:    FARXIGA  10 MG TABS tablet, Take 10 mg by mouth daily. , Disp: , Rfl:  gabapentin  (NEURONTIN ) 100 MG capsule, TAKE 2 CAPSULES(200 MG) BY MOUTH AT BEDTIME (Patient taking differently: Take 400 mg by mouth at bedtime.), Disp: 180 capsule, Rfl: 1   losartan  (COZAAR ) 25 MG tablet, Take 1 tablet (25 mg total) by mouth daily. Take blood pressure if systolic is less than 95 pt is to hold Losartan  dose, Disp: , Rfl:    metFORMIN  (GLUCOPHAGE ) 500 MG tablet, Take 1,000 mg by mouth 2 (two) times daily with a meal., Disp: , Rfl:    metoprolol  tartrate (LOPRESSOR ) 25 MG tablet, Take 1.5 tablets (37.5 mg total) by mouth 2 (two) times daily., Disp: 270 tablet, Rfl: 3   mometasone (NASONEX) 50 MCG/ACT nasal spray, Place 2 sprays into the nose daily as needed (Rhinitis)., Disp: , Rfl:    Multiple Vitamin (MULTIVITAMIN) capsule, Take 1 capsule by mouth daily., Disp: , Rfl:    tirzepatide (MOUNJARO) 12.5 MG/0.5ML Pen, Inject 12.5 mg into the skin once a week., Disp: , Rfl:    tiZANidine  (ZANAFLEX ) 2 MG tablet, Take 1 tablet (2 mg total) by mouth at bedtime. (Patient taking differently: Take 4 mg by mouth at bedtime.), Disp: 90 tablet, Rfl: 1   traMADol  (ULTRAM ) 50 MG tablet, Take 1 tablet (50 mg total) by mouth every 6 (six) hours as needed for severe pain (pain score 7-10)., Disp: 28 tablet, Rfl: 0 [2]   Social History Tobacco Use  Smoking Status Former   Current packs/day: 0.00   Average packs/day: 0.5 packs/day for 12.0 years (6.0 ttl pk-yrs)   Types: Cigarettes   Start date: 12/15/1979   Quit date: 12/15/1991   Years since quitting: 32.8  Smokeless Tobacco Former   Types: Snuff  Tobacco Comments   quit snuff in ~ 1981

## 2024-10-23 ENCOUNTER — Encounter (HOSPITAL_COMMUNITY)
Admission: RE | Admit: 2024-10-23 | Discharge: 2024-10-23 | Disposition: A | Source: Ambulatory Visit | Attending: Cardiology | Admitting: Cardiology

## 2024-10-23 ENCOUNTER — Encounter (HOSPITAL_COMMUNITY)

## 2024-10-23 DIAGNOSIS — Z952 Presence of prosthetic heart valve: Secondary | ICD-10-CM | POA: Diagnosis not present

## 2024-10-23 DIAGNOSIS — Z951 Presence of aortocoronary bypass graft: Secondary | ICD-10-CM

## 2024-10-25 ENCOUNTER — Encounter (HOSPITAL_COMMUNITY)
Admission: RE | Admit: 2024-10-25 | Discharge: 2024-10-25 | Disposition: A | Source: Ambulatory Visit | Attending: Cardiology | Admitting: Cardiology

## 2024-10-25 ENCOUNTER — Encounter (HOSPITAL_COMMUNITY)

## 2024-10-25 DIAGNOSIS — Z952 Presence of prosthetic heart valve: Secondary | ICD-10-CM

## 2024-10-25 DIAGNOSIS — Z951 Presence of aortocoronary bypass graft: Secondary | ICD-10-CM

## 2024-10-28 ENCOUNTER — Encounter (HOSPITAL_COMMUNITY)

## 2024-10-30 ENCOUNTER — Encounter (HOSPITAL_COMMUNITY)

## 2024-10-30 ENCOUNTER — Encounter (HOSPITAL_COMMUNITY): Admission: RE | Admit: 2024-10-30 | Discharge: 2024-10-30 | Attending: Cardiology | Admitting: Cardiology

## 2024-10-30 ENCOUNTER — Encounter: Payer: Self-pay | Admitting: Cardiology

## 2024-10-30 DIAGNOSIS — Z952 Presence of prosthetic heart valve: Secondary | ICD-10-CM

## 2024-10-30 DIAGNOSIS — Z951 Presence of aortocoronary bypass graft: Secondary | ICD-10-CM

## 2024-11-01 ENCOUNTER — Encounter (HOSPITAL_COMMUNITY)

## 2024-11-01 LAB — GLUCOSE, CAPILLARY: Glucose-Capillary: 139 mg/dL — ABNORMAL HIGH (ref 70–99)

## 2024-11-04 ENCOUNTER — Encounter (HOSPITAL_COMMUNITY)

## 2024-11-06 ENCOUNTER — Encounter (HOSPITAL_COMMUNITY)

## 2024-11-08 ENCOUNTER — Encounter (HOSPITAL_COMMUNITY)

## 2024-11-11 ENCOUNTER — Encounter (HOSPITAL_COMMUNITY)

## 2024-11-13 ENCOUNTER — Encounter (HOSPITAL_COMMUNITY)

## 2024-11-15 ENCOUNTER — Encounter (HOSPITAL_COMMUNITY)

## 2024-11-18 ENCOUNTER — Encounter (HOSPITAL_COMMUNITY)

## 2024-11-20 ENCOUNTER — Encounter (HOSPITAL_COMMUNITY)

## 2024-11-22 ENCOUNTER — Encounter (HOSPITAL_COMMUNITY)

## 2024-11-25 ENCOUNTER — Encounter (HOSPITAL_COMMUNITY)

## 2024-11-27 ENCOUNTER — Encounter (HOSPITAL_COMMUNITY)

## 2024-12-09 ENCOUNTER — Ambulatory Visit
